# Patient Record
Sex: Female | Born: 1955 | Race: White | Hispanic: No | Marital: Married | Smoking: Never smoker
Health system: Southern US, Community
[De-identification: ages and names within clinical notes are randomized; demographics above are authoritative.]

## PROBLEM LIST (undated history)

## (undated) DIAGNOSIS — F419 Anxiety disorder, unspecified: Secondary | ICD-10-CM

## (undated) DIAGNOSIS — I8390 Asymptomatic varicose veins of unspecified lower extremity: Secondary | ICD-10-CM

## (undated) DIAGNOSIS — I4510 Unspecified right bundle-branch block: Secondary | ICD-10-CM

## (undated) DIAGNOSIS — N318 Other neuromuscular dysfunction of bladder: Secondary | ICD-10-CM

## (undated) DIAGNOSIS — N3010 Interstitial cystitis (chronic) without hematuria: Secondary | ICD-10-CM

## (undated) DIAGNOSIS — Z87442 Personal history of urinary calculi: Secondary | ICD-10-CM

## (undated) DIAGNOSIS — G709 Myoneural disorder, unspecified: Secondary | ICD-10-CM

## (undated) DIAGNOSIS — K219 Gastro-esophageal reflux disease without esophagitis: Secondary | ICD-10-CM

## (undated) DIAGNOSIS — H3530 Unspecified macular degeneration: Secondary | ICD-10-CM

## (undated) DIAGNOSIS — T7840XA Allergy, unspecified, initial encounter: Secondary | ICD-10-CM

## (undated) DIAGNOSIS — M1990 Unspecified osteoarthritis, unspecified site: Secondary | ICD-10-CM

## (undated) DIAGNOSIS — R591 Generalized enlarged lymph nodes: Secondary | ICD-10-CM

## (undated) DIAGNOSIS — D860 Sarcoidosis of lung: Secondary | ICD-10-CM

## (undated) DIAGNOSIS — E669 Obesity, unspecified: Secondary | ICD-10-CM

## (undated) DIAGNOSIS — I10 Essential (primary) hypertension: Secondary | ICD-10-CM

## (undated) DIAGNOSIS — D649 Anemia, unspecified: Secondary | ICD-10-CM

## (undated) DIAGNOSIS — IMO0001 Reserved for inherently not codable concepts without codable children: Secondary | ICD-10-CM

## (undated) DIAGNOSIS — E041 Nontoxic single thyroid nodule: Secondary | ICD-10-CM

## (undated) HISTORY — DX: Obesity, unspecified: E66.9

## (undated) HISTORY — DX: Anxiety disorder, unspecified: F41.9

## (undated) HISTORY — DX: Interstitial cystitis (chronic) without hematuria: N30.10

## (undated) HISTORY — DX: Gastro-esophageal reflux disease without esophagitis: K21.9

## (undated) HISTORY — PX: BLADDER SURGERY: SHX569

## (undated) HISTORY — DX: Unspecified macular degeneration: H35.30

## (undated) HISTORY — DX: Allergy, unspecified, initial encounter: T78.40XA

## (undated) HISTORY — PX: FOOT SURGERY: SHX648

## (undated) HISTORY — DX: Reserved for inherently not codable concepts without codable children: IMO0001

## (undated) HISTORY — DX: Nontoxic single thyroid nodule: E04.1

---
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
## 1978-02-14 DIAGNOSIS — IMO0001 Reserved for inherently not codable concepts without codable children: Secondary | ICD-10-CM

## 1978-02-14 HISTORY — PX: COLPOSCOPY: SHX161

## 1978-02-14 HISTORY — PX: CRYOTHERAPY: SHX1416

## 1978-02-14 HISTORY — DX: Reserved for inherently not codable concepts without codable children: IMO0001

## 1987-02-15 HISTORY — PX: NASAL SINUS SURGERY: SHX719

## 1995-02-15 HISTORY — PX: KNEE SURGERY: SHX244

## 1995-02-15 HISTORY — PX: EYE SURGERY: SHX253

## 1998-03-05 ENCOUNTER — Ambulatory Visit (HOSPITAL_COMMUNITY): Admission: RE | Admit: 1998-03-05 | Discharge: 1998-03-05 | Payer: Self-pay | Admitting: Obstetrics and Gynecology

## 1999-03-15 ENCOUNTER — Encounter: Payer: Self-pay | Admitting: Obstetrics and Gynecology

## 1999-03-15 ENCOUNTER — Ambulatory Visit (HOSPITAL_COMMUNITY): Admission: RE | Admit: 1999-03-15 | Discharge: 1999-03-15 | Payer: Self-pay | Admitting: Obstetrics and Gynecology

## 1999-10-05 ENCOUNTER — Ambulatory Visit (HOSPITAL_COMMUNITY): Admission: RE | Admit: 1999-10-05 | Discharge: 1999-10-05 | Payer: Self-pay | Admitting: Pulmonary Disease

## 1999-10-05 ENCOUNTER — Encounter: Payer: Self-pay | Admitting: Pulmonary Disease

## 2000-03-20 ENCOUNTER — Ambulatory Visit (HOSPITAL_COMMUNITY): Admission: RE | Admit: 2000-03-20 | Discharge: 2000-03-20 | Payer: Self-pay | Admitting: Obstetrics and Gynecology

## 2000-03-20 ENCOUNTER — Encounter: Payer: Self-pay | Admitting: Obstetrics and Gynecology

## 2001-03-22 ENCOUNTER — Encounter: Payer: Self-pay | Admitting: Obstetrics and Gynecology

## 2001-03-22 ENCOUNTER — Ambulatory Visit (HOSPITAL_COMMUNITY): Admission: RE | Admit: 2001-03-22 | Discharge: 2001-03-22 | Payer: Self-pay | Admitting: Obstetrics and Gynecology

## 2001-06-29 ENCOUNTER — Other Ambulatory Visit: Admission: RE | Admit: 2001-06-29 | Discharge: 2001-06-29 | Payer: Self-pay | Admitting: Obstetrics and Gynecology

## 2002-03-27 ENCOUNTER — Ambulatory Visit (HOSPITAL_COMMUNITY): Admission: RE | Admit: 2002-03-27 | Discharge: 2002-03-27 | Payer: Self-pay | Admitting: Obstetrics and Gynecology

## 2002-03-27 ENCOUNTER — Encounter: Payer: Self-pay | Admitting: Obstetrics and Gynecology

## 2002-07-01 ENCOUNTER — Other Ambulatory Visit: Admission: RE | Admit: 2002-07-01 | Discharge: 2002-07-01 | Payer: Self-pay | Admitting: Obstetrics and Gynecology

## 2003-02-15 HISTORY — PX: GASTRIC BYPASS: SHX52

## 2003-04-07 ENCOUNTER — Ambulatory Visit (HOSPITAL_COMMUNITY): Admission: RE | Admit: 2003-04-07 | Discharge: 2003-04-07 | Payer: Self-pay | Admitting: Obstetrics and Gynecology

## 2003-07-17 ENCOUNTER — Other Ambulatory Visit: Admission: RE | Admit: 2003-07-17 | Discharge: 2003-07-17 | Payer: Self-pay | Admitting: Obstetrics and Gynecology

## 2004-01-12 ENCOUNTER — Ambulatory Visit: Payer: Self-pay | Admitting: Pulmonary Disease

## 2004-02-15 HISTORY — PX: REFRACTIVE SURGERY: SHX103

## 2004-04-15 ENCOUNTER — Ambulatory Visit (HOSPITAL_COMMUNITY): Admission: RE | Admit: 2004-04-15 | Discharge: 2004-04-15 | Payer: Self-pay | Admitting: Obstetrics and Gynecology

## 2004-07-19 ENCOUNTER — Other Ambulatory Visit: Admission: RE | Admit: 2004-07-19 | Discharge: 2004-07-19 | Payer: Self-pay | Admitting: Obstetrics and Gynecology

## 2004-09-27 ENCOUNTER — Ambulatory Visit: Payer: Self-pay | Admitting: Pulmonary Disease

## 2004-10-28 ENCOUNTER — Ambulatory Visit: Payer: Self-pay | Admitting: Pulmonary Disease

## 2004-11-18 ENCOUNTER — Ambulatory Visit: Payer: Self-pay | Admitting: Pulmonary Disease

## 2004-12-02 ENCOUNTER — Ambulatory Visit: Payer: Self-pay | Admitting: Internal Medicine

## 2005-02-14 HISTORY — PX: STOMACH SURGERY: SHX791

## 2005-04-20 ENCOUNTER — Ambulatory Visit (HOSPITAL_COMMUNITY): Admission: RE | Admit: 2005-04-20 | Discharge: 2005-04-20 | Payer: Self-pay | Admitting: Obstetrics and Gynecology

## 2005-07-01 ENCOUNTER — Ambulatory Visit: Payer: Self-pay | Admitting: Pulmonary Disease

## 2005-07-21 ENCOUNTER — Other Ambulatory Visit: Admission: RE | Admit: 2005-07-21 | Discharge: 2005-07-21 | Payer: Self-pay | Admitting: Obstetrics and Gynecology

## 2005-12-06 ENCOUNTER — Ambulatory Visit: Payer: Self-pay | Admitting: Pulmonary Disease

## 2006-02-14 HISTORY — PX: JOINT REPLACEMENT: SHX530

## 2006-03-10 ENCOUNTER — Ambulatory Visit: Payer: Self-pay | Admitting: Pulmonary Disease

## 2006-03-10 LAB — CONVERTED CEMR LAB
Albumin: 4 g/dL (ref 3.5–5.2)
BUN: 17 mg/dL (ref 6–23)
Basophils Relative: 0.6 % (ref 0.0–1.0)
Calcium: 9.3 mg/dL (ref 8.4–10.5)
Chloride: 107 meq/L (ref 96–112)
Eosinophils Relative: 3.6 % (ref 0.0–5.0)
GFR calc Af Amer: 114 mL/min
HCT: 39.8 % (ref 36.0–46.0)
Hemoglobin, Urine: NEGATIVE
Hemoglobin: 13.8 g/dL (ref 12.0–15.0)
Ketones, ur: 40 mg/dL — AB
LDL Cholesterol: 83 mg/dL (ref 0–99)
MCV: 85.2 fL (ref 78.0–100.0)
Monocytes Absolute: 0.5 10*3/uL (ref 0.2–0.7)
Mucus, UA: NEGATIVE
Neutrophils Relative %: 59.7 % (ref 43.0–77.0)
RBC / HPF: NONE SEEN
RBC: 4.68 M/uL (ref 3.87–5.11)
Sodium: 141 meq/L (ref 135–145)
Total CHOL/HDL Ratio: 3.1
Triglycerides: 59 mg/dL (ref 0–149)
Urine Glucose: NEGATIVE mg/dL
Urobilinogen, UA: 0.2 (ref 0.0–1.0)
VLDL: 12 mg/dL (ref 0–40)
WBC: 7.7 10*3/uL (ref 4.5–10.5)

## 2006-03-16 ENCOUNTER — Ambulatory Visit: Payer: Self-pay | Admitting: Cardiology

## 2006-03-30 ENCOUNTER — Ambulatory Visit: Payer: Self-pay

## 2006-03-30 ENCOUNTER — Encounter: Payer: Self-pay | Admitting: Cardiology

## 2006-04-11 ENCOUNTER — Ambulatory Visit: Payer: Self-pay | Admitting: Pulmonary Disease

## 2006-05-01 ENCOUNTER — Ambulatory Visit (HOSPITAL_COMMUNITY): Admission: RE | Admit: 2006-05-01 | Discharge: 2006-05-01 | Payer: Self-pay | Admitting: Gastroenterology

## 2006-05-17 ENCOUNTER — Inpatient Hospital Stay (HOSPITAL_COMMUNITY): Admission: RE | Admit: 2006-05-17 | Discharge: 2006-05-21 | Payer: Self-pay | Admitting: Specialist

## 2006-06-24 ENCOUNTER — Ambulatory Visit: Payer: Self-pay | Admitting: Family Medicine

## 2006-06-30 ENCOUNTER — Ambulatory Visit: Payer: Self-pay | Admitting: Pulmonary Disease

## 2006-07-25 ENCOUNTER — Other Ambulatory Visit: Admission: RE | Admit: 2006-07-25 | Discharge: 2006-07-25 | Payer: Self-pay | Admitting: Obstetrics and Gynecology

## 2006-07-28 ENCOUNTER — Ambulatory Visit: Payer: Self-pay | Admitting: Gastroenterology

## 2006-08-04 ENCOUNTER — Ambulatory Visit: Payer: Self-pay | Admitting: Gastroenterology

## 2006-08-04 LAB — HM COLONOSCOPY

## 2006-08-16 ENCOUNTER — Ambulatory Visit (HOSPITAL_BASED_OUTPATIENT_CLINIC_OR_DEPARTMENT_OTHER): Admission: RE | Admit: 2006-08-16 | Discharge: 2006-08-16 | Payer: Self-pay | Admitting: Specialist

## 2006-11-20 ENCOUNTER — Ambulatory Visit: Payer: Self-pay | Admitting: Pulmonary Disease

## 2006-12-06 ENCOUNTER — Ambulatory Visit: Payer: Self-pay | Admitting: Pulmonary Disease

## 2007-01-09 ENCOUNTER — Telehealth: Payer: Self-pay | Admitting: Pulmonary Disease

## 2007-02-27 DIAGNOSIS — K219 Gastro-esophageal reflux disease without esophagitis: Secondary | ICD-10-CM | POA: Insufficient documentation

## 2007-02-27 DIAGNOSIS — I872 Venous insufficiency (chronic) (peripheral): Secondary | ICD-10-CM | POA: Insufficient documentation

## 2007-02-27 DIAGNOSIS — I10 Essential (primary) hypertension: Secondary | ICD-10-CM | POA: Insufficient documentation

## 2007-02-27 DIAGNOSIS — J309 Allergic rhinitis, unspecified: Secondary | ICD-10-CM | POA: Insufficient documentation

## 2007-02-27 DIAGNOSIS — M1990 Unspecified osteoarthritis, unspecified site: Secondary | ICD-10-CM | POA: Insufficient documentation

## 2007-02-27 DIAGNOSIS — F411 Generalized anxiety disorder: Secondary | ICD-10-CM | POA: Insufficient documentation

## 2007-02-27 DIAGNOSIS — F419 Anxiety disorder, unspecified: Secondary | ICD-10-CM | POA: Insufficient documentation

## 2007-02-27 DIAGNOSIS — K649 Unspecified hemorrhoids: Secondary | ICD-10-CM | POA: Insufficient documentation

## 2007-05-03 ENCOUNTER — Ambulatory Visit (HOSPITAL_COMMUNITY): Admission: RE | Admit: 2007-05-03 | Discharge: 2007-05-03 | Payer: Self-pay | Admitting: Obstetrics and Gynecology

## 2007-07-26 ENCOUNTER — Other Ambulatory Visit: Admission: RE | Admit: 2007-07-26 | Discharge: 2007-07-26 | Payer: Self-pay | Admitting: Obstetrics and Gynecology

## 2008-01-03 ENCOUNTER — Ambulatory Visit: Payer: Self-pay | Admitting: Obstetrics and Gynecology

## 2008-02-13 ENCOUNTER — Ambulatory Visit: Payer: Self-pay | Admitting: Pulmonary Disease

## 2008-02-23 ENCOUNTER — Emergency Department (HOSPITAL_COMMUNITY): Admission: EM | Admit: 2008-02-23 | Discharge: 2008-02-23 | Payer: Self-pay | Admitting: Emergency Medicine

## 2008-05-06 ENCOUNTER — Ambulatory Visit (HOSPITAL_COMMUNITY): Admission: RE | Admit: 2008-05-06 | Discharge: 2008-05-06 | Payer: Self-pay | Admitting: Obstetrics and Gynecology

## 2008-07-28 ENCOUNTER — Other Ambulatory Visit: Admission: RE | Admit: 2008-07-28 | Discharge: 2008-07-28 | Payer: Self-pay | Admitting: Obstetrics and Gynecology

## 2008-07-28 ENCOUNTER — Ambulatory Visit: Payer: Self-pay | Admitting: Obstetrics and Gynecology

## 2008-07-28 ENCOUNTER — Encounter: Payer: Self-pay | Admitting: Obstetrics and Gynecology

## 2008-07-29 LAB — CONVERTED CEMR LAB: Pap Smear: NORMAL

## 2008-08-06 ENCOUNTER — Ambulatory Visit: Payer: Self-pay | Admitting: Internal Medicine

## 2008-08-06 ENCOUNTER — Telehealth (INDEPENDENT_AMBULATORY_CARE_PROVIDER_SITE_OTHER): Payer: Self-pay | Admitting: *Deleted

## 2008-08-11 ENCOUNTER — Telehealth: Payer: Self-pay | Admitting: Pulmonary Disease

## 2008-08-14 DIAGNOSIS — E041 Nontoxic single thyroid nodule: Secondary | ICD-10-CM

## 2008-08-14 HISTORY — DX: Nontoxic single thyroid nodule: E04.1

## 2008-08-21 ENCOUNTER — Ambulatory Visit: Payer: Self-pay | Admitting: Internal Medicine

## 2008-08-21 DIAGNOSIS — M810 Age-related osteoporosis without current pathological fracture: Secondary | ICD-10-CM

## 2008-08-21 DIAGNOSIS — Z9884 Bariatric surgery status: Secondary | ICD-10-CM | POA: Insufficient documentation

## 2008-08-21 HISTORY — DX: Age-related osteoporosis without current pathological fracture: M81.0

## 2008-08-21 LAB — CONVERTED CEMR LAB
ALT: 34 units/L (ref 0–35)
BUN: 11 mg/dL (ref 6–23)
Bilirubin, Direct: 0.1 mg/dL (ref 0.0–0.3)
CO2: 24 meq/L (ref 19–32)
Chloride: 107 meq/L (ref 96–112)
Eosinophils Absolute: 0.3 10*3/uL (ref 0.0–0.7)
Eosinophils Relative: 3 % (ref 0–5)
HDL: 47 mg/dL (ref >39)
Lymphocytes Relative: 38 % (ref 12–46)
Lymphs Abs: 3.2 10*3/uL (ref 0.7–4.0)
Monocytes Relative: 7 % (ref 3–12)
Platelets: 376 10*3/uL (ref 150–400)
Potassium: 4.8 meq/L (ref 3.5–5.3)
RBC: 4.81 M/uL (ref 3.87–5.11)
TIBC: 295 ug/dL (ref 250–470)
TSH: 0.336 microintl units/mL — ABNORMAL LOW (ref 0.350–4.500)
Total Bilirubin: 0.4 mg/dL (ref 0.3–1.2)
Triglycerides: 74 mg/dL (ref <150)
UIBC: 257 ug/dL
VLDL: 15 mg/dL (ref 0–40)
Vit D, 1,25-Dihydroxy: 26 — ABNORMAL LOW (ref 30–89)
Vitamin B-12: 1841 pg/mL — ABNORMAL HIGH (ref 211–911)
WBC: 8.5 10*3/uL (ref 4.0–10.5)

## 2008-08-22 ENCOUNTER — Telehealth: Payer: Self-pay | Admitting: Internal Medicine

## 2008-09-02 ENCOUNTER — Ambulatory Visit: Payer: Self-pay | Admitting: Obstetrics and Gynecology

## 2008-09-08 ENCOUNTER — Ambulatory Visit: Payer: Self-pay | Admitting: Diagnostic Radiology

## 2008-09-08 ENCOUNTER — Ambulatory Visit (HOSPITAL_BASED_OUTPATIENT_CLINIC_OR_DEPARTMENT_OTHER): Admission: RE | Admit: 2008-09-08 | Discharge: 2008-09-08 | Payer: Self-pay | Admitting: Internal Medicine

## 2008-09-08 ENCOUNTER — Ambulatory Visit: Payer: Self-pay | Admitting: Internal Medicine

## 2008-09-08 DIAGNOSIS — R748 Abnormal levels of other serum enzymes: Secondary | ICD-10-CM | POA: Insufficient documentation

## 2008-09-08 DIAGNOSIS — E079 Disorder of thyroid, unspecified: Secondary | ICD-10-CM | POA: Insufficient documentation

## 2008-09-08 DIAGNOSIS — E559 Vitamin D deficiency, unspecified: Secondary | ICD-10-CM | POA: Insufficient documentation

## 2008-09-08 LAB — CONVERTED CEMR LAB
ALT: 17 units/L (ref 0–35)
Alkaline Phosphatase: 122 units/L — ABNORMAL HIGH (ref 39–117)
Thyroglobulin Ab: 30.7 (ref 0.0–60.0)

## 2008-09-10 ENCOUNTER — Telehealth: Payer: Self-pay | Admitting: Internal Medicine

## 2008-09-10 DIAGNOSIS — E041 Nontoxic single thyroid nodule: Secondary | ICD-10-CM | POA: Insufficient documentation

## 2008-09-17 ENCOUNTER — Telehealth: Payer: Self-pay | Admitting: Internal Medicine

## 2008-09-24 ENCOUNTER — Ambulatory Visit: Payer: Self-pay | Admitting: Internal Medicine

## 2008-09-25 ENCOUNTER — Encounter: Payer: Self-pay | Admitting: Internal Medicine

## 2008-10-15 ENCOUNTER — Ambulatory Visit: Payer: Self-pay | Admitting: Gastroenterology

## 2008-10-16 LAB — CONVERTED CEMR LAB
Albumin: 3.7 g/dL (ref 3.5–5.2)
Basophils Absolute: 0 10*3/uL (ref 0.0–0.1)
CO2: 29 meq/L (ref 19–32)
Chloride: 106 meq/L (ref 96–112)
Eosinophils Absolute: 0.3 10*3/uL (ref 0.0–0.7)
Eosinophils Relative: 3.4 % (ref 0.0–5.0)
GFR calc non Af Amer: 93.22 mL/min (ref >60)
HCT: 37.2 % (ref 36.0–46.0)
Hemoglobin: 12.8 g/dL (ref 12.0–15.0)
INR: 1.1 — ABNORMAL HIGH (ref 0.8–1.0)
IgG (Immunoglobin G), Serum: 1183 mg/dL (ref 694–1618)
Lymphocytes Relative: 37.6 % (ref 12.0–46.0)
MCV: 82.9 fL (ref 78.0–100.0)
Monocytes Relative: 5.1 % (ref 3.0–12.0)
Neutrophils Relative %: 53.9 % (ref 43.0–77.0)
Prothrombin Time: 11.2 s (ref 9.1–11.7)
RBC: 4.49 M/uL (ref 3.87–5.11)
Sodium: 142 meq/L (ref 135–145)
Total Protein: 7.1 g/dL (ref 6.0–8.3)
Transferrin: 245.5 mg/dL (ref 212.0–360.0)
WBC: 7.7 10*3/uL (ref 4.5–10.5)

## 2008-10-24 LAB — CONVERTED CEMR LAB
A-1 Antitrypsin, Ser: 152 mg/dL (ref 83–200)
Anti Nuclear Antibody(ANA): NEGATIVE
HCV Ab: NEGATIVE
Hep B S Ab: NEGATIVE
Tissue Transglutaminase Ab, IgA: 0.8 units (ref <7)

## 2008-11-12 ENCOUNTER — Telehealth: Payer: Self-pay | Admitting: Internal Medicine

## 2008-12-30 ENCOUNTER — Ambulatory Visit: Payer: Self-pay | Admitting: Internal Medicine

## 2008-12-30 ENCOUNTER — Telehealth: Payer: Self-pay | Admitting: Internal Medicine

## 2009-02-09 ENCOUNTER — Ambulatory Visit: Payer: Self-pay | Admitting: Internal Medicine

## 2009-02-09 DIAGNOSIS — N390 Urinary tract infection, site not specified: Secondary | ICD-10-CM | POA: Insufficient documentation

## 2009-02-09 LAB — CONVERTED CEMR LAB
Bilirubin Urine: NEGATIVE
Blood in Urine, dipstick: NEGATIVE
Glucose, Urine, Semiquant: NEGATIVE
Specific Gravity, Urine: <1.005
WBC Urine, dipstick: NEGATIVE
pH: 5

## 2009-02-17 ENCOUNTER — Telehealth: Payer: Self-pay | Admitting: Internal Medicine

## 2009-03-02 ENCOUNTER — Telehealth: Payer: Self-pay | Admitting: Internal Medicine

## 2009-03-02 ENCOUNTER — Ambulatory Visit (HOSPITAL_BASED_OUTPATIENT_CLINIC_OR_DEPARTMENT_OTHER): Admission: RE | Admit: 2009-03-02 | Discharge: 2009-03-02 | Payer: Self-pay | Admitting: Internal Medicine

## 2009-03-02 ENCOUNTER — Ambulatory Visit: Payer: Self-pay | Admitting: Diagnostic Radiology

## 2009-03-23 ENCOUNTER — Encounter (HOSPITAL_COMMUNITY): Admission: RE | Admit: 2009-03-23 | Discharge: 2009-05-28 | Payer: Self-pay | Admitting: Internal Medicine

## 2009-03-24 ENCOUNTER — Telehealth: Payer: Self-pay | Admitting: Internal Medicine

## 2009-03-31 ENCOUNTER — Ambulatory Visit: Payer: Self-pay | Admitting: Internal Medicine

## 2009-03-31 DIAGNOSIS — J329 Chronic sinusitis, unspecified: Secondary | ICD-10-CM | POA: Insufficient documentation

## 2009-04-01 ENCOUNTER — Encounter: Admission: RE | Admit: 2009-04-01 | Discharge: 2009-04-01 | Payer: Self-pay | Admitting: Internal Medicine

## 2009-04-01 ENCOUNTER — Other Ambulatory Visit: Admission: RE | Admit: 2009-04-01 | Discharge: 2009-04-01 | Payer: Self-pay | Admitting: Interventional Radiology

## 2009-04-01 ENCOUNTER — Encounter: Payer: Self-pay | Admitting: Internal Medicine

## 2009-04-06 ENCOUNTER — Telehealth: Payer: Self-pay | Admitting: Internal Medicine

## 2009-05-07 ENCOUNTER — Ambulatory Visit: Payer: Self-pay | Admitting: Diagnostic Radiology

## 2009-05-07 ENCOUNTER — Ambulatory Visit (HOSPITAL_BASED_OUTPATIENT_CLINIC_OR_DEPARTMENT_OTHER): Admission: RE | Admit: 2009-05-07 | Discharge: 2009-05-07 | Payer: Self-pay | Admitting: Obstetrics and Gynecology

## 2009-05-14 ENCOUNTER — Ambulatory Visit: Payer: Self-pay | Admitting: Internal Medicine

## 2009-05-14 LAB — CONVERTED CEMR LAB
Bilirubin Urine: NEGATIVE
Nitrite: NEGATIVE
Specific Gravity, Urine: 1.01
pH: 6.5

## 2009-05-19 ENCOUNTER — Telehealth: Payer: Self-pay | Admitting: Internal Medicine

## 2009-07-10 ENCOUNTER — Ambulatory Visit: Payer: Self-pay | Admitting: Internal Medicine

## 2009-07-24 ENCOUNTER — Ambulatory Visit: Payer: Self-pay | Admitting: Internal Medicine

## 2009-07-24 ENCOUNTER — Telehealth (INDEPENDENT_AMBULATORY_CARE_PROVIDER_SITE_OTHER): Payer: Self-pay | Admitting: *Deleted

## 2009-07-24 LAB — CONVERTED CEMR LAB
Bilirubin Urine: NEGATIVE
Blood in Urine, dipstick: NEGATIVE
Glucose, Urine, Semiquant: NEGATIVE
Ketones, urine, test strip: NEGATIVE
Protein, U semiquant: NEGATIVE
Urobilinogen, UA: 0.2
pH: 6.5

## 2009-07-27 ENCOUNTER — Encounter: Payer: Self-pay | Admitting: Internal Medicine

## 2009-07-29 ENCOUNTER — Ambulatory Visit: Payer: Self-pay | Admitting: Gastroenterology

## 2009-07-30 ENCOUNTER — Ambulatory Visit: Payer: Self-pay | Admitting: Obstetrics and Gynecology

## 2009-07-30 ENCOUNTER — Other Ambulatory Visit: Admission: RE | Admit: 2009-07-30 | Discharge: 2009-07-30 | Payer: Self-pay | Admitting: Obstetrics and Gynecology

## 2009-08-03 LAB — CONVERTED CEMR LAB: Pap Smear: NORMAL

## 2009-08-05 LAB — CONVERTED CEMR LAB
ALT: 18 units/L (ref 0–35)
AST: 19 units/L (ref 0–37)
Total Bilirubin: 0.3 mg/dL (ref 0.3–1.2)

## 2009-09-02 ENCOUNTER — Ambulatory Visit: Payer: Self-pay | Admitting: Internal Medicine

## 2009-09-02 LAB — CONVERTED CEMR LAB
BUN: 17 mg/dL
CO2: 23 meq/L
Calcium: 8.7 mg/dL
Chloride: 109 meq/L
Cholesterol: 187 mg/dL
Creatinine, Ser: 0.56 mg/dL
Glucose, Bld: 92 mg/dL
HCT: 40.8 %
HDL: 63 mg/dL
Hemoglobin: 12.4 g/dL
LDL Cholesterol: 110 mg/dL — ABNORMAL HIGH
MCHC: 30.4 g/dL
MCV: 85.9 fL
Platelets: 336 K/uL
Potassium: 4.5 meq/L
RBC: 4.75 M/uL
RDW: 16.5 % — ABNORMAL HIGH
Sodium: 143 meq/L
TSH: 0.66 u[IU]/mL
Total CHOL/HDL Ratio: 3
Triglycerides: 68 mg/dL
VLDL: 14 mg/dL
Vit D, 1,25-Dihydroxy: 31
WBC: 6.7 10*3/microliter

## 2009-09-03 ENCOUNTER — Encounter: Payer: Self-pay | Admitting: Internal Medicine

## 2009-10-06 ENCOUNTER — Ambulatory Visit (HOSPITAL_BASED_OUTPATIENT_CLINIC_OR_DEPARTMENT_OTHER): Admission: RE | Admit: 2009-10-06 | Discharge: 2009-10-06 | Payer: Self-pay | Admitting: Internal Medicine

## 2009-10-06 ENCOUNTER — Ambulatory Visit: Payer: Self-pay | Admitting: Diagnostic Radiology

## 2009-10-08 ENCOUNTER — Encounter: Payer: Self-pay | Admitting: Internal Medicine

## 2009-11-12 ENCOUNTER — Ambulatory Visit: Payer: Self-pay | Admitting: Internal Medicine

## 2009-12-01 ENCOUNTER — Ambulatory Visit: Payer: Self-pay | Admitting: Internal Medicine

## 2010-01-05 ENCOUNTER — Telehealth: Payer: Self-pay | Admitting: Internal Medicine

## 2010-02-16 ENCOUNTER — Emergency Department (HOSPITAL_BASED_OUTPATIENT_CLINIC_OR_DEPARTMENT_OTHER)
Admission: EM | Admit: 2010-02-16 | Discharge: 2010-02-16 | Payer: Self-pay | Source: Home / Self Care | Admitting: Podiatry

## 2010-02-25 ENCOUNTER — Ambulatory Visit: Admit: 2010-02-25 | Payer: Self-pay | Admitting: Internal Medicine

## 2010-03-07 ENCOUNTER — Encounter: Payer: Self-pay | Admitting: Internal Medicine

## 2010-03-08 ENCOUNTER — Telehealth: Payer: Self-pay | Admitting: Internal Medicine

## 2010-03-11 ENCOUNTER — Encounter: Payer: Self-pay | Admitting: Internal Medicine

## 2010-03-11 ENCOUNTER — Ambulatory Visit (INDEPENDENT_AMBULATORY_CARE_PROVIDER_SITE_OTHER)
Admission: RE | Admit: 2010-03-11 | Discharge: 2010-03-11 | Payer: PRIVATE HEALTH INSURANCE | Source: Home / Self Care | Attending: Internal Medicine | Admitting: Internal Medicine

## 2010-03-11 DIAGNOSIS — J069 Acute upper respiratory infection, unspecified: Secondary | ICD-10-CM | POA: Insufficient documentation

## 2010-03-11 DIAGNOSIS — Z01818 Encounter for other preprocedural examination: Secondary | ICD-10-CM

## 2010-03-11 DIAGNOSIS — N390 Urinary tract infection, site not specified: Secondary | ICD-10-CM

## 2010-03-11 LAB — CONVERTED CEMR LAB
Bilirubin Urine: NEGATIVE
Blood in Urine, dipstick: NEGATIVE
Ketones, urine, test strip: NEGATIVE
Nitrite: NEGATIVE
Protein, U semiquant: NEGATIVE
Specific Gravity, Urine: 1.01
Urobilinogen, UA: 0.2

## 2010-03-16 NOTE — Assessment & Plan Note (Signed)
Summary: UTI/MHF   Vital Signs:  Patient profile:   55 year old female Height:      65 inches Weight:      227.50 pounds BMI:     37.99 O2 Sat:      99 % on Room air Temp:     98.1 degrees F oral Pulse rate:   85 / minute Pulse rhythm:   regular Resp:     22 per minute BP sitting:   122 / 86  (right arm) Cuff size:   large  Vitals Entered By: Glendell Docker CMA (May 14, 2009 11:17 AM)  O2 Flow:  Room air CC: Rm 2- Urinary Problems, Dysuria   Primary Care Provider:  Dondra Spry DO  CC:  Rm 2- Urinary Problems and Dysuria.  History of Present Illness:  Dysuria      This is a 55 year old woman who presents with Dysuria.  The patient reports burning with urination, urinary frequency, and urgency.  The patient denies the following associated symptoms: fever, shaking chills, flank pain, and back pain.  she has hx of interstitial cystitis.  Dr. Normajean Baxter started amitriptyline.  Allergies: 1)  ! Codeine 2)  ! Cortisone 3)  ! Ibuprofen (Ibuprofen) 4)  Prednisone (Prednisone) 5)  Macrobid  Past History:  Past Medical History: Obesity, status post bariatric procedure and [place] 2005 GERD  Borderline Hypertension  Allergic rhinitis  Osteoporosis    Elevated Alk Phos - negative work up (presumed secondary to fatty liver) Thyroid nodules 08/2008 Interstitial cystitis - Dr. Normajean Baxter  Family History: Family History Diabetes 1st degree relative Family History High cholesterol Family History Hypertension Family History of Prostate CA 1st degree relative <50 Colon ca - no           Social History: Occupation: [employer] Divorced 1 daughter bipolar 22   Never Smoked    Alcohol use-no       Physical Exam  General:  alert, well-developed, and well-nourished.   Lungs:  normal respiratory effort and normal breath sounds.   Heart:  normal rate, regular rhythm, and no gallop.   Abdomen:  soft, non-tender, and normal bowel sounds.  no flank  tendereness   Impression & Recommendations:  Problem # 1:  UTI (ICD-599.0) 55 y/o with UTI.  no assoc with sexual activity.  menopause since early 18's.  I suggest she increase fluids and take cranberry tabs.  If persistent recurrent UTI, consider add estrace vaginal cream  Her updated medication list for this problem includes:    Ciprofloxacin Hcl 500 Mg Tabs (Ciprofloxacin hcl) ..... One by mouth two times a day  Problem # 2:  ANXIETY (ICD-300.00) Assessment: Unchanged Wt gain when she take prozac.  switch to citalopram The following medications were removed from the medication list:    Prozac 10 Mg Caps (Fluoxetine hcl) .Marland Kitchen... Take 1 capsule by mouth once a day Her updated medication list for this problem includes:    Amitriptyline Hcl 50 Mg Tabs (Amitriptyline hcl) .Marland Kitchen... Take 1 tablet by mouth once a day    Alprazolam 0.5 Mg Tabs (Alprazolam) .Marland Kitchen... Take 1 tablet by mouth two times a day as needed    Citalopram Hydrobromide 20 Mg Tabs (Citalopram hydrobromide) .Marland Kitchen... 1/2 by mouth once daily x 7 days, then one tab by mouth once daily  Complete Medication List: 1)  Amitriptyline Hcl 50 Mg Tabs (Amitriptyline hcl) .... Take 1 tablet by mouth once a day 2)  Omeprazole 40 Mg Cpdr (Omeprazole) .Marland KitchenMarland KitchenMarland Kitchen  2 caps by mouth two times a day 3)  Alprazolam 0.5 Mg Tabs (Alprazolam) .... Take 1 tablet by mouth two times a day as needed 4)  Zyrtec Allergy 10 Mg Tabs (Cetirizine hcl) .... Take 1 tablet by mouth once a day 5)  Tums Ultra 1000 1000 Mg Chew (Calcium carbonate antacid) .... Two times a day 6)  Actonel 150 Mg Tabs (Risedronate sodium) .... Once a month 7)  Omnaris 50 Mcg/act Susp (Ciclesonide) .Marland Kitchen.. 1 spray each nostril two times a day 8)  Astepro 0.15 % Soln (Azelastine hcl) .Marland Kitchen.. 1 spray each nostril two times a day 9)  Fish Oil 1000 Mg Caps (Omega-3 fatty acids) .... Take 1 capsule by mouth two times a day 10)  Vitamin D 1000 Unit Tabs (Cholecalciferol) .... Take 2 tablets by mouth once a  day 11)  Valtrex 500 Mg Tabs (Valacyclovir hcl) .... Take 1 tablet by mouth once a day as needed 12)  Multivitamins Tabs (Multiple vitamin) .... Take 1 tablet by mouth two times a day 13)  Ra Vitamin A 21308 Unit Caps (Vitamin a) .... Take 1 capsule by mouth once a day 14)  Citalopram Hydrobromide 20 Mg Tabs (Citalopram hydrobromide) .... 1/2 by mouth once daily x 7 days, then one tab by mouth once daily 15)  Ciprofloxacin Hcl 500 Mg Tabs (Ciprofloxacin hcl) .... One by mouth two times a day  Patient Instructions: 1)  Please schedule a follow-up appointment in 2 months. Prescriptions: CIPROFLOXACIN HCL 500 MG TABS (CIPROFLOXACIN HCL) one by mouth two times a day  #10 x 0   Entered and Authorized by:   D. Thomos Lemons DO   Signed by:   D. Thomos Lemons DO on 05/14/2009   Method used:   Electronically to        [facility] (retail)       [address]       [place], [place]  [address]       Ph: [phone]       Fax: [phone]   RxID:   [id number] CITALOPRAM HYDROBROMIDE 20 MG TABS (CITALOPRAM HYDROBROMIDE) 1/2 by mouth once daily x 7 days, then one tab by mouth once daily  #30 x 5   Entered and Authorized by:   D. Thomos Lemons DO   Signed by:   D. Thomos Lemons DO on 05/14/2009   Method used:   Electronically to        [facility] (retail)       [address]       [place], [place]  [address]       Ph: [phone]       Fax: [phone]   RxID:   [id number]   Current Allergies (reviewed today): ! CODEINE ! CORTISONE ! IBUPROFEN (IBUPROFEN) PREDNISONE (PREDNISONE) MACROBID  Laboratory Results   Urine Tests    Routine Urinalysis   Color: lt. yellow Appearance: Hazy Glucose: negative   (Normal Range: Negative) Bilirubin: negative   (Normal Range: Negative) Ketone: negative   (Normal Range: Negative) Spec. Gravity: 1.010   (Normal Range: 1.003-1.035) Blood: small   (Normal  Range: Negative) pH: 6.5   (Normal Range: 5.0-8.0) Protein: negative   (Normal Range: Negative) Urobilinogen: 0.2   (Normal Range: 0-1) Nitrite: negative   (Normal Range: Negative) Leukocyte Esterace: large   (Normal Range: Negative)

## 2010-03-16 NOTE — Progress Notes (Signed)
Summary: Alprazolam Refill  Phone Note Refill Request Message from:  Fax from Pharmacy on January 05, 2010 10:41 AM  Refills Requested: Medication #1:  ALPRAZOLAM 0.5 MG TABS Take 1 tablet by mouth two times a day as needed   Dosage confirmed as above?Dosage Confirmed   Brand Name Necessary? No   Supply Requested: 1 month   Last Refilled: 12/02/2009 [facility] [address] [place] [address] fax [phone].     Method Requested: Electronic Next Appointment Scheduled: 02-25-10 Dr Artist Pais  Initial call taken by: Roselle Locus,  January 05, 2010 10:42 AM  Follow-up for Phone Call        refill x 2  Follow-up by: D. Thomos Lemons DO,  January 05, 2010 2:51 PM  Additional Follow-up for Phone Call Additional follow up Details #1::        Rx called to pharmacy Additional Follow-up by: Glendell Docker CMA,  January 05, 2010 3:10 PM    Prescriptions: ALPRAZOLAM 0.5 MG TABS (ALPRAZOLAM) Take 1 tablet by mouth two times a day as needed  #60 x 2   Entered by:   Glendell Docker CMA   Authorized by:   D. Thomos Lemons DO   Signed by:   Glendell Docker CMA on 01/05/2010   Method used:   Telephoned to ...       [facility] (retail)       [address]       [place], [place]  [address]       Ph: [phone]       Fax: [phone]   RxID:   [id number]

## 2010-03-16 NOTE — Progress Notes (Signed)
  Phone Note Outgoing Call   Summary of Call: call pt - thyroid biopsy negative for cancer Initial call taken by: D. Thomos Lemons DO,  April 06, 2009 4:43 PM  Follow-up for Phone Call        Pt notified as directed   repeat U/S   in July, 2011 Follow-up by: Darral Dash,  April 07, 2009 10:12 AM

## 2010-03-16 NOTE — Progress Notes (Signed)
Summary: Unresolved Urinary symptoms  Phone Note Call from Patient Call back at [phone]   Caller: Patient Call For: D. Thomos Lemons DO Summary of Call: patient called and left voice message stating  she was seen last week and given antibiotics for a sinus infection which she has completed. Her message states she is still having sinus pressure at night and would like to know if Dr Artist Pais would provide another round of antibiotics for her Initial call taken by: Glendell Docker CMA,  May 19, 2009 4:33 PM  Follow-up for Phone Call        please clarify message.  pt seen for UTI,  not sinusitis. what symptoms is pt having Follow-up by: D. Thomos Lemons DO,  May 19, 2009 6:35 PM  Additional Follow-up for Phone Call Additional follow up Details #1::        patient  states  she mis-spoke when the message was left , she has completed the Cipro, however she is still having urinary pressure , urgency and buring ,she is taking Azo otc,she denies temp,  Additional Follow-up by: Glendell Docker CMA,  May 20, 2009 10:09 AM    Additional Follow-up for Phone Call Additional follow up Details #2::    see rx for ceftin.  if persistent, symptoms she will need OV Follow-up by: D. Thomos Lemons DO,  May 20, 2009 12:19 PM  Additional Follow-up for Phone Call Additional follow up Details #3:: Details for Additional Follow-up Action Taken: patient advised per Dr Artist Pais instructions Additional Follow-up by: Glendell Docker CMA,  May 20, 2009 1:26 PM  New/Updated Medications: CEFUROXIME AXETIL 500 MG TABS (CEFUROXIME AXETIL) one by mouth two times a day Prescriptions: CEFUROXIME AXETIL 500 MG TABS (CEFUROXIME AXETIL) one by mouth two times a day  #14 x 0   Entered and Authorized by:   D. Thomos Lemons DO   Signed by:   D. Thomos Lemons DO on 05/20/2009   Method used:   Electronically to        [facility] (retail)       [address]       [place], [place]  [address]       Ph: [phone]       Fax: [phone]   RxID:   [id number]

## 2010-03-16 NOTE — Progress Notes (Signed)
  Phone Note Outgoing Call   Summary of Call: call pt - thyroid u/s is stable.  ongoing surveillance recommended.  repeat U/S in 6 months. Initial call taken by: D. Thomos Lemons DO,  March 02, 2009 5:19 PM  Follow-up for Phone Call        Left message with detail message and a request for return call   Pt called back  informed as directed     Follow-up by: Darral Dash,  March 03, 2009 2:27 PM

## 2010-03-16 NOTE — Progress Notes (Signed)
  Phone Note Outgoing Call   Summary of Call: call pt - nuclear thyroid scan also shows nodule.  fine needle biopsy recommended.  see order Initial call taken by: D. Thomos Lemons DO,  March 24, 2009 5:46 PM  Follow-up for Phone Call        patient advised \\par  [name] [name]  March 25, 2009 3:35 PM

## 2010-03-16 NOTE — Assessment & Plan Note (Signed)
Summary: CPX NO PAP/DT rsch from 08-24-09 bmp/dt   Vital Signs:  Patient profile:   55 year old female Weight:      219.25 pounds BMI:     36.62 O2 Sat:      98 % on Room air Temp:     98.2 degrees F oral Pulse rate:   93 / minute Pulse rhythm:   regular Resp:     18 per minute BP sitting:   108 / 74  (right arm) Cuff size:   large  Vitals Entered By: Glendell Docker CMA (September 02, 2009 8:42 AM)  O2 Flow:  Room air CC: Rm 3- CPX Is Patient Diabetic? No Pain Assessment Patient in pain? no      Comments fasting for labs   Primary Care Provider:  D. Thomos Lemons DO  CC:  Rm 3- CPX.  History of Present Illness: 55 y/o white female with PMHx of :  Obesity, status post bariatric procedure and [place] 2005 GERD  Borderline Hypertension    Allergic rhinitis   Osteoporosis    Elevated Alk Phos - negative work up (presumed secondary to fatty liver) Thyroid nodules 08/2008  Interstitial cystitis - Dr. Normajean Baxter  for routine cpx.  no significant interval hx less stress with daughter,  sertraline helping  PAPs completed by GYN    Preventive Screening-Counseling & Management  Alcohol-Tobacco     Alcohol drinks/day: 0     Smoking Status: never  Caffeine-Diet-Exercise     Caffeine use/day: 1 cup coffee daily     Does Patient Exercise: yes     Type of exercise: walking     Times/week: 7  Allergies: 1)  ! Codeine 2)  ! Cortisone 3)  ! Ibuprofen (Ibuprofen) 4)  Prednisone (Prednisone) 5)  Macrobid  Past History:  Past Medical History: Obesity, status post bariatric procedure and [place] 2005 GERD  Borderline Hypertension    Allergic rhinitis   Osteoporosis    Elevated Alk Phos - negative work up (presumed secondary to fatty liver) Thyroid nodules 08/2008  Interstitial cystitis - Dr. Normajean Baxter  Social History: Caffeine use/day:  1 cup coffee daily Does Patient Exercise:  yes  Review of Systems  The patient denies weight loss, weight gain, chest pain,  dyspnea on exertion, abdominal pain, melena, hematochezia, severe indigestion/heartburn, and depression.    Physical Exam  General:  alert and overweight-appearing.   Head:  normocephalic and atraumatic.   Ears:  R ear normal and L ear normal.   Mouth:  Oral mucosa and oropharynx without lesions or exudates.  Teeth in good repair. Neck:  No deformities, masses, or tenderness noted. Lungs:  Normal respiratory effort, chest expands symmetrically. Lungs are clear to auscultation, no crackles or wheezes. Heart:  Normal rate and regular rhythm. S1 and S2 normal without gallop, murmur, click, rub or other extra sounds. Abdomen:  soft, non-tender, normal bowel sounds, no masses, no hepatomegaly, and no splenomegaly.   Extremities:  ankles puffy but no pitting edema minor varicosities of lower ext Neurologic:  cranial nerves II-XII intact and gait normal.   Psych:  normally interactive, good eye contact, not anxious appearing, and not depressed appearing.     Impression & Recommendations:  Problem # 1:  HEALTH MAINTENANCE EXAM (ICD-V70.0) Reviewed adult health maintenance protocols. PAP as per GYN Pt counseled on diet and exercise.  Orders: EKG w/ Interpretation (93000)  Mammogram: ASSESSMENT: Negative - BI-RADS 1^MM DIGITAL SCREENING (05/07/2009) Pap smear: normal (08/03/2009) Colonoscopy: Location:  [facility].   (  08/04/2006) Bone Density: abnormal (05/30/2006) Td Booster: Tdap (09/02/2009)   Flu Vax: Fluvax Non-MCR (11/25/2008)   Chol: 158 (08/21/2008)   HDL: 47 (08/21/2008)   LDL: 96 (08/21/2008)   TG: 74 (08/21/2008) TSH: 0.521 (09/08/2008)     Complete Medication List: 1)  Amitriptyline Hcl 50 Mg Tabs (Amitriptyline hcl) .... Take 1/2  tablet by mouth once a day 2)  Omeprazole 40 Mg Cpdr (Omeprazole) .Marland Kitchen.. 1 cap by mouth two times a day 3)  Alprazolam 0.5 Mg Tabs (Alprazolam) .... Take 1 tablet by mouth two times a day as needed 4)  Zyrtec Allergy 10 Mg Tabs  (Cetirizine hcl) .... Take 1 tablet by mouth once a day 5)  Tums Ultra 1000 1000 Mg Chew (Calcium carbonate antacid) .... Two times a day 6)  Fosamax 70 Mg Tabs (Alendronate sodium) .... One tablet by mouth once a week 7)  Omnaris 50 Mcg/act Susp (Ciclesonide) .Marland Kitchen.. 1 spray each nostril two times a day 8)  Astepro 0.15 % Soln (Azelastine hcl) .Marland Kitchen.. 1 spray each nostril two times a day 9)  Fish Oil 1000 Mg Caps (Omega-3 fatty acids) .... Take 1 capsule by mouth two times a day 10)  Vitamin D 1000 Unit Tabs (Cholecalciferol) .... Take 2 tablets by mouth once a day 11)  Valtrex 500 Mg Tabs (Valacyclovir hcl) .... Take 1 tablet by mouth once a day as needed 12)  Multivitamins Tabs (Multiple vitamin) .... Take 1 tablet by mouth two times a day 13)  Ra Vitamin A 82956 Unit Caps (Vitamin a) .... Take 1 capsule by mouth once a day 14)  Cranberry 400 Mg Caps (Cranberry) .... Take 1 tablet by mouth once a day 15)  Sertraline Hcl 25 Mg Tabs (Sertraline hcl) .... One by mouth once daily 16)  Furosemide 20 Mg Tabs (Furosemide) .... One by mouth once daily as directed  Other Orders: T-Basic Metabolic Panel [id number]) T-Lipid Profile [id number]) T-TSH [id number]) T-CBC No Diff [phone]) T- * Misc. Laboratory test [phone]) Tdap => 68yrs IM (63875) Admin 1st Vaccine (64332)  Patient Instructions: 1)  Please schedule a follow-up appointment in 6 months. Prescriptions: ALPRAZOLAM 0.5 MG TABS (ALPRAZOLAM) Take 1 tablet by mouth two times a day as needed  #60 x 3   Entered and Authorized by:   D. Thomos Lemons DO   Signed by:   D. Thomos Lemons DO on 09/02/2009   Method used:   Print then Give to Patient   RxID:   [id number] AMITRIPTYLINE HCL 50 MG TABS (AMITRIPTYLINE HCL) Take 1/2  tablet by mouth once a day  #30 x 5   Entered and Authorized by:   D. Thomos Lemons DO   Signed by:   D. Thomos Lemons DO on 09/02/2009   Method used:   Electronically to        [facility] [address] [address] (retail)        [address]       [place], [place]  [address]       Ph: [phone]       Fax: [phone]   RxID:   [id number]   Current Allergies (reviewed today): ! CODEINE ! CORTISONE ! IBUPROFEN (IBUPROFEN) PREDNISONE (PREDNISONE) MACROBID   Preventive Care Screening  Pap Smear:    Date:  08/03/2009    Results:  normal      Immunizations Administered:  Tetanus Vaccine:    Vaccine Type: Tdap  Site: left deltoid    Mfr: GlaxoSmithKline    Dose: 0.5 ml    Route: IM    Given by: Glendell Docker CMA    Exp. Date: 05/08/2011    Lot #: [id number]    VIS given: 01/02/07 version given September 02, 2009.

## 2010-03-16 NOTE — Letter (Signed)
   [place] [facility] [address] [place], [place]  [address]  [place] Phone: [phone]      October 08, 2009   [facility] [address] [place], [place] [address]  RE:  LAB RESULTS  Dear  [name],  The following is an interpretation of your most recent lab tests.  Please take note of any instructions provided or changes to medications that have resulted from your lab work.    Thyroid ultrasound results:    IMPRESSION: Stable thyroid ultrasound examination.  Small nodules bilaterally.       Sincerely Yours,    Dr. Thomos Lemons

## 2010-03-16 NOTE — Assessment & Plan Note (Signed)
Summary: 2 month follow up/mhf   Vital Signs:  Patient profile:   55 year old female Weight:      217.50 pounds BMI:     36.32 O2 Sat:      98 % on Room air Temp:     98.2 degrees F oral Pulse rate:   94 / minute Pulse rhythm:   regular BP sitting:   100 / 80  (right arm) Cuff size:   large  Vitals Entered By: Glendell Docker CMA (March 31, 2009 8:22 AM)  O2 Flow:  Room air  Primary Care Provider:  D. Thomos Lemons DO  CC:  2 Month Follow up.  History of Present Illness: 2 Month Follow up  c/o unresolved nasal congestion, dry sinuses nasal drainage into throat  daughter has bipolar.  substance abuse with marijuana.  tried to commit suicide by taking pills stayed in psych ward for 3 days  her GYN prescribed prozac.  works well for her  Dr. Sharyn Lull - Allergist  Allergies: 1)  ! Codeine 2)  ! Cortisone 3)  ! Ibuprofen (Ibuprofen) 4)  Prednisone (Prednisone)  Past History:  Past Medical History: Obesity, status post bariatric procedure and [place] 2005 GERD  Borderline Hypertension  Allergic rhinitis  Osteoporosis    Elevated Alk Phos - negative work up (presumed secondary to fatty liver) Thyroid nodules 08/2008  Past Surgical History: Left Total knee replacement 2008 Tummy tuck 2007 Gastric bypass 2005   Bilateral Cataract extraction 1997  Left Knee surgery 1997  Lasik eye surgery 2006 Sinus surgery in 1989 - Dr. Lazarus Salines Colonoscopy 2008 - no polyps  Family History: Family History Diabetes 1st degree relative Family History High cholesterol Family History Hypertension Family History of Prostate CA 1st degree relative <50 Colon ca - no          Social History: Occupation: [employer] Office Divorced 1 daughter bipolar 22   Never Smoked    Alcohol use-no     Physical Exam  General:  alert and overweight-appearing.   Ears:  R ear normal and L ear normal.   Nose:  mucosal edema.   Neck:  No deformities, masses, or tenderness noted. Lungs:   normal respiratory effort and normal breath sounds.   Heart:  normal rate, regular rhythm, and no gallop.     Impression & Recommendations:  Problem # 1:  THYROID NODULE, RIGHT (ICD-241.0) She is scheduled for FNA.  thyroid uptake scan showed cold right thyroid nodule.  Problem # 2:  RHINITIS (ICD-472.0) Pt with chronic post nasal gtt.  Pt followed by allergist - Dr Sharyn Lull.  she has been taking omnaris.   add nasal saline irrigation  Complete Medication List: 1)  Amitriptyline Hcl 50 Mg Tabs (Amitriptyline hcl) .... Take 1 tablet by mouth once a day 2)  Omeprazole 40 Mg Cpdr (Omeprazole) .... 2 caps by mouth two times a day 3)  Alprazolam 0.5 Mg Tabs (Alprazolam) .... Take 1 tablet by mouth two times a day as needed 4)  Zyrtec Allergy 10 Mg Tabs (Cetirizine hcl) .... Take 1 tablet by mouth once a day 5)  Tums Ultra 1000 1000 Mg Chew (Calcium carbonate antacid) .... Two times a day 6)  Actonel 150 Mg Tabs (Risedronate sodium) .... Once a month 7)  Omnaris 50 Mcg/act Susp (Ciclesonide) .Marland Kitchen.. 1 spray each nostril two times a day 8)  Astepro 0.15 % Soln (Azelastine hcl) .Marland Kitchen.. 1 spray each nostril two times a day 9)  Fish Oil 1000 Mg Caps (  Omega-3 fatty acids) .... Take 1 capsule by mouth two times a day 10)  Vitamin D 1000 Unit Tabs (Cholecalciferol) .... Take 2 tablets by mouth once a day 11)  Valtrex 500 Mg Tabs (Valacyclovir hcl) .... Take 1 tablet by mouth once a day as needed 12)  Multivitamins Tabs (Multiple vitamin) .... Take 1 tablet by mouth two times a day 13)  Prozac 10 Mg Caps (Fluoxetine hcl) .... Take 1 capsule by mouth once a day 14)  Ra Vitamin A 04540 Unit Caps (Vitamin a) .... Take 1 capsule by mouth once a day  Patient Instructions: 1)  Use Lloyd Huger Med sinus rinse 2)  Please schedule a follow-up appointment in 4 months.  Current Allergies (reviewed today): ! CODEINE ! CORTISONE ! IBUPROFEN (IBUPROFEN) PREDNISONE (PREDNISONE)

## 2010-03-16 NOTE — Miscellaneous (Signed)
Summary: urine culture   Clinical Lists Changes  Orders: Added new Service order of Specimen Handling ([id number]) - Signed Added new Test order of T-Culture, Urine ([id number]) - Signed

## 2010-03-16 NOTE — Assessment & Plan Note (Signed)
Summary: FLUID BUILD UP IN LEGS AND FEET /HEA   Vital Signs:  Patient profile:   55 year old female Height:      65 inches Weight:      222.50 pounds BMI:     37.16 O2 Sat:      100 % on Room air Temp:     98.1 degrees F oral Pulse rate:   114 / minute Pulse rhythm:   irregular Resp:     18 per minute BP sitting:   110 / 74  (right arm) Cuff size:   large  Vitals Entered By: Glendell Docker CMA (Jul 10, 2009 8:48 AM)  O2 Flow:  Room air CC: Rm 2 swelling   Primary Care Provider:  Dondra Spry DO  CC:  Rm 2 swelling.  History of Present Illness: 55 y/o white female with hx of morbid obesity (s/p gastric bypass) c/o swelling in  right foot and ankle for the past week  Allergies: 1)  ! Codeine 2)  ! Cortisone 3)  ! Ibuprofen (Ibuprofen) 4)  Prednisone (Prednisone) 5)  Macrobid  Past History:  Past Medical History: Obesity, status post bariatric procedure and [place] 2005 GERD  Borderline Hypertension  Allergic rhinitis   Osteoporosis    Elevated Alk Phos - negative work up (presumed secondary to fatty liver) Thyroid nodules 08/2008 Interstitial cystitis - Dr. Normajean Baxter  Past Surgical History: Left Total knee replacement 2008 Tummy tuck 2007 Gastric bypass 2005   Bilateral Cataract extraction 1997  Left Knee surgery 1997   Lasik eye surgery 2006 Sinus surgery in 1989 - Dr. Lazarus Salines Colonoscopy 2008 - no polyps  Social History: Occupation: [employer] Divorced 1 daughter bipolar 22   Never Smoked    Alcohol use-no        Physical Exam  General:  alert, well-developed, and well-nourished.   Lungs:  normal respiratory effort and normal breath sounds.   Heart:  regular rhythm, no gallop, and tachycardia.   Extremities:  trace left pedal edema and trace right pedal edema.     Impression & Recommendations:  Problem # 1:  HYPERTENSION, BORDERLINE (ICD-401.9) Pt with lower ext edema.  likely related to obesity.  pt advised to follow low salt  diet.  use lasix as directed.  Her updated medication list for this problem includes:    Furosemide 20 Mg Tabs (Furosemide) ..... One by mouth once daily as directed  BP today: 110/74 Prior BP: 122/86 (05/14/2009)  Labs Reviewed: K+: 4.2 (10/15/2008) Creat: : 0.7 (10/15/2008)   Chol: 158 (08/21/2008)   HDL: 47 (08/21/2008)   LDL: 96 (08/21/2008)   TG: 74 (08/21/2008)  Complete Medication List: 1)  Amitriptyline Hcl 50 Mg Tabs (Amitriptyline hcl) .... Take 1/2  tablet by mouth once a day 2)  Omeprazole 40 Mg Cpdr (Omeprazole) .... 2 caps by mouth two times a day 3)  Alprazolam 0.5 Mg Tabs (Alprazolam) .... Take 1 tablet by mouth two times a day as needed 4)  Zyrtec Allergy 10 Mg Tabs (Cetirizine hcl) .... Take 1 tablet by mouth once a day 5)  Tums Ultra 1000 1000 Mg Chew (Calcium carbonate antacid) .... Two times a day 6)  Actonel 150 Mg Tabs (Risedronate sodium) .... Once a month 7)  Omnaris 50 Mcg/act Susp (Ciclesonide) .Marland Kitchen.. 1 spray each nostril two times a day 8)  Astepro 0.15 % Soln (Azelastine hcl) .Marland Kitchen.. 1 spray each nostril two times a day 9)  Fish Oil 1000 Mg Caps (Omega-3 fatty acids) .Marland KitchenMarland KitchenMarland Kitchen  Take 1 capsule by mouth two times a day 10)  Vitamin D 1000 Unit Tabs (Cholecalciferol) .... Take 2 tablets by mouth once a day 11)  Valtrex 500 Mg Tabs (Valacyclovir hcl) .... Take 1 tablet by mouth once a day as needed 12)  Multivitamins Tabs (Multiple vitamin) .... Take 1 tablet by mouth two times a day 13)  Ra Vitamin A 04540 Unit Caps (Vitamin a) .... Take 1 capsule by mouth once a day 14)  Cranberry 400 Mg Caps (Cranberry) .... Take 1 tablet by mouth once a day 15)  Sertraline Hcl 25 Mg Tabs (Sertraline hcl) .... 1/2 by mouth once daily x 7 days, then one by mouth qd 16)  Furosemide 20 Mg Tabs (Furosemide) .... One by mouth once daily as directed  Patient Instructions: 1)  Keep your next appointment 2)  Low salt diet - 2 to 2.5 grams per day Prescriptions: FUROSEMIDE 20 MG TABS  (FUROSEMIDE) one by mouth once daily as directed  #30 x 3   Entered and Authorized by:   D. Thomos Lemons DO   Signed by:   D. Thomos Lemons DO on 07/10/2009   Method used:   Electronically to        [facility] (retail)       [address]       [place], [place]  [address]       Ph: [phone]       Fax: [phone]   RxID:   [id number] SERTRALINE HCL 25 MG TABS (SERTRALINE HCL) 1/2 by mouth once daily x 7 days, then one by mouth qd  #30 x 3   Entered and Authorized by:   D. Thomos Lemons DO   Signed by:   D. Thomos Lemons DO on 07/10/2009   Method used:   Electronically to        [facility] (retail)       [address]       [place], [place]  [address]       Ph: [phone]       Fax: [phone]   RxID:   [id number]   Current Allergies (reviewed today): ! CODEINE ! CORTISONE ! IBUPROFEN (IBUPROFEN) PREDNISONE (PREDNISONE) MACROBID

## 2010-03-16 NOTE — Assessment & Plan Note (Signed)
Summary: sinus infection/mhf--Rm 3   Vital Signs:  Patient profile:   55 year old female Height:      65 inches Weight:      224 pounds BMI:     37.41 Temp:     98.1 degrees F oral Pulse rate:   72 / minute Pulse rhythm:   regular Resp:     16 per minute BP sitting:   104 / 78  (right arm) Cuff size:   large  Vitals Entered By: Mervin Kung CMA Duncan Dull) (December 01, 2009 1:24 PM) CC: Rm 3  Pt has had head congestion, headache and ears buzzing since Friday, sinus drainage x 1 day. Has tried OTC Zyrtec D with little relief. Is Patient Diabetic? No Comments Pt agrees all med doses and directions are correct. Nicki Guadalajara Fergerson CMA Duncan Dull)  December 01, 2009 1:32 PM    Primary Care Provider:  D. Thomos Lemons DO  CC:  Rm 3  Pt has had head congestion, headache and ears buzzing since Friday, and sinus drainage x 1 day. Has tried OTC Zyrtec D with little relief..  History of Present Illness: 55 y/o white female c/o sinus congestion onset last Friday tried zyrtec D on Sat - no significant improvement   Preventive Screening-Counseling & Management  Alcohol-Tobacco     Alcohol drinks/day: 0     Alcohol Counseling: not indicated; patient does not drink     Smoking Status: never     Tobacco Counseling: not indicated; no tobacco use  Allergies: 1)  ! Codeine 2)  ! Cortisone 3)  ! Ibuprofen (Ibuprofen) 4)  Prednisone (Prednisone) 5)  Macrobid  Past History:  Past Medical History: Obesity, status post bariatric procedure and [place] 2005 GERD  Borderline Hypertension    Allergic rhinitis    Osteoporosis    Elevated Alk Phos - negative work up (presumed secondary to fatty liver) Thyroid nodules 08/2008  Interstitial cystitis - Dr. Normajean Baxter  Past Surgical History: Left Total knee replacement 2008 Tummy tuck 2007 Gastric bypass 2005   Bilateral Cataract extraction 1997   Left Knee surgery 1997   Lasik eye surgery 2006  Sinus surgery in 1989 - Dr. Lazarus Salines Colonoscopy  2008 - no polyps  Physical Exam  General:  alert and overweight-appearing.   Ears:  right and left TM retracted.  no redness Nose:  mucosal erythema and mucosal edema.   Mouth:  pharynx pink and moist.     Impression & Recommendations:  Problem # 1:  SINUSITIS (ICD-473.9) pt to try conservative measures first - nasal saline rinse and mucinex if persistent symptoms,  use cefin as directed Patient advised to call office if symptoms persist or worsen.  Her updated medication list for this problem includes:    Zyrtec-d Allergy & Congestion 5-120 Mg Xr12h-tab (Cetirizine-pseudoephedrine) ..... One by mouth two times a day as needed    Omnaris 50 Mcg/act Susp (Ciclesonide) .Marland Kitchen... 1 spray each nostril two times a day    Astepro 0.15 % Soln (Azelastine hcl) .Marland Kitchen... 1 spray each nostril two times a day    Cefuroxime Axetil 500 Mg Tabs (Cefuroxime axetil) ..... One by mouth two times a day    Mucinex 600 Mg Xr12h-tab (Guaifenesin) ..... One by mouth two times a day  Complete Medication List: 1)  Amitriptyline Hcl 50 Mg Tabs (Amitriptyline hcl) .... Take 1/2  tablet by mouth once a day 2)  Omeprazole 40 Mg Cpdr (Omeprazole) .Marland Kitchen.. 1 cap by mouth two times a day 3)  Alprazolam 0.5 Mg Tabs (Alprazolam) .... Take 1 tablet by mouth two times a day as needed 4)  Zyrtec-d Allergy & Congestion 5-120 Mg Xr12h-tab (Cetirizine-pseudoephedrine) .... One by mouth two times a day as needed 5)  Tums Ultra 1000 1000 Mg Chew (Calcium carbonate antacid) .... Two times a day 6)  Fosamax 70 Mg Tabs (Alendronate sodium) .... One tablet by mouth once a week 7)  Omnaris 50 Mcg/act Susp (Ciclesonide) .Marland Kitchen.. 1 spray each nostril two times a day 8)  Astepro 0.15 % Soln (Azelastine hcl) .Marland Kitchen.. 1 spray each nostril two times a day 9)  Fish Oil 1000 Mg Caps (Omega-3 fatty acids) .... Take 1 capsule by mouth two times a day 10)  Vitamin D 1000 Unit Tabs (Cholecalciferol) .... Take 2 tablets by mouth once a day 11)  Valtrex 500 Mg  Tabs (Valacyclovir hcl) .... Take 1 tablet by mouth once a day as needed 12)  Multivitamins Tabs (Multiple vitamin) .... Take 1 tablet by mouth two times a day 13)  Ra Vitamin A 40981 Unit Caps (Vitamin a) .... Take 1 capsule by mouth once a day 14)  Cranberry 400 Mg Caps (Cranberry) .... Take 1 tablet by mouth once a day 15)  Sertraline Hcl 25 Mg Tabs (Sertraline hcl) .... One by mouth once daily 16)  Furosemide 20 Mg Tabs (Furosemide) .... One by mouth once daily as directed 17)  Cefuroxime Axetil 500 Mg Tabs (Cefuroxime axetil) .... One by mouth two times a day 18)  Mucinex 600 Mg Xr12h-tab (Guaifenesin) .... One by mouth two times a day  Patient Instructions: 1)  Call our office if your symptoms do not  improve or gets worse. Prescriptions: ZYRTEC-D ALLERGY & CONGESTION 5-120 MG XR12H-TAB (CETIRIZINE-PSEUDOEPHEDRINE) one by mouth two times a day as needed  #30 x 0   Entered and Authorized by:   D. Thomos Lemons DO   Signed by:   D. Thomos Lemons DO on 12/01/2009   Method used:   Print then Give to Patient   RxID:   [id number] MUCINEX 600 MG XR12H-TAB (GUAIFENESIN) one by mouth two times a day  #30 x 0   Entered and Authorized by:   D. Thomos Lemons DO   Signed by:   D. Thomos Lemons DO on 12/01/2009   Method used:   Print then Give to Patient   RxID:   [id number] CEFUROXIME AXETIL 500 MG TABS (CEFUROXIME AXETIL) one by mouth two times a day  #20 x 0   Entered and Authorized by:   D. Thomos Lemons DO   Signed by:   D. Thomos Lemons DO on 12/01/2009   Method used:   Print then Give to Patient   RxID:   [id number]    Orders Added: 1)  Est. Patient Level III [47425]

## 2010-03-16 NOTE — Letter (Signed)
   [place] [facility] [address] [place], [place]  [address]  [place] Phone: [phone]      September 03, 2009   [facility] [address] [place], [place] [address]  RE:  LAB RESULTS  Dear  [name],  The following is an interpretation of your most recent lab tests.  Please take note of any instructions provided or changes to medications that have resulted from your lab work.  ELECTROLYTES:  Good - no changes needed  KIDNEY FUNCTION TESTS:  Good - no changes needed  LIVER FUNCTION TESTS:  Good - no changes needed  LIPID PANEL:  Good - no changes needed Triglyceride: 68   Cholesterol: 187   LDL: 110   HDL: 63   Chol/HDL%:  3.0 Ratio  THYROID STUDIES:  Thyroid studies normal TSH: 0.660     CBC:  Good - no changes needed  Vitamin D level:  normal       Sincerely Yours,    Dr. Thomos Lemons  Appended Document:  Mailed.

## 2010-03-16 NOTE — Assessment & Plan Note (Signed)
Summary: flu shot/dt  Nurse Visit   Vital Signs:  Patient profile:   55 year old female Temp:     98.1 degrees F oral  Vitals Entered By: Mervin Kung CMA Duncan Dull) (November 12, 2009 8:18 AM)  Allergies: 1)  ! Codeine 2)  ! Cortisone 3)  ! Ibuprofen (Ibuprofen) 4)  Prednisone (Prednisone) 5)  Macrobid  Orders Added: 1)  Admin 1st Vaccine [90471] 2)  Flu Vaccine 5yrs + [11914]  Flu Vaccine Consent Questions     Do you have a history of severe allergic reactions to this vaccine? no    Any prior history of allergic reactions to egg and/or gelatin? no    Do you have a sensitivity to the preservative Thimersol? no    Do you have a past history of Guillan-Barre Syndrome? no    Do you currently have an acute febrile illness? no    Have you ever had a severe reaction to latex? no    Vaccine information given and explained to patient? yes    Are you currently pregnant? no    Lot Number:[id number]   Exp Date:08/14/2010   Site Given  Left Deltoid IM.  Nicki Guadalajara Fergerson CMA Duncan Dull)  November 12, 2009 8:18 AM

## 2010-03-16 NOTE — Progress Notes (Signed)
Summary: lab reminder   Phone Note Outgoing Call Call back at [facility] Phone [phone]   Call placed by: Chales Abrahams CMA Duncan Dull),  July 24, 2009 9:52 AM Summary of Call: pt called and reminded to have labs Initial call taken by: Chales Abrahams CMA Duncan Dull),  July 24, 2009 9:52 AM

## 2010-03-16 NOTE — Progress Notes (Signed)
Summary: Status Update  Phone Note Call from Patient Call back at [phone]   Caller: Patient Summary of Call: 10:14am patient called and left voice message stating she was seen on the 27th of December for a sinus infection. Today is the last day of antibiotics and she states she is not feeling any better, she is still having head congestion, and sinus headaches. Her message states that she was advised to call back for another round of antibiotics if there was no improvement Initial call taken by: Glendell Docker CMA,  February 17, 2009 4:04 PM  Follow-up for Phone Call        pt. called back and stated that she was just wanting to make sure she heard something back from the Dr.. She states she has NO fever and there is some improvement, but feels like she needs a little more meds. Please call her back.Michaelle Copas  February 17, 2009 4:18 PM   Additional Follow-up for Phone Call Additional follow up Details #1::        take additional 1 week of ceftin.  see rx Additional Follow-up by: D. Thomos Lemons DO,  February 17, 2009 4:59 PM    Additional Follow-up for Phone Call Additional follow up Details #2::    patient advised per Dr Artist Pais instructions Follow-up by: Glendell Docker CMA,  February 17, 2009 5:08 PM  Prescriptions: CEFUROXIME AXETIL 500 MG TABS (CEFUROXIME AXETIL) one by mouth two times a day  #14 x 0   Entered and Authorized by:   D. Thomos Lemons DO   Signed by:   D. Thomos Lemons DO on 02/17/2009   Method used:   Electronically to        [facility] (retail)       [address]       [place], [place]  [address]       Ph: [phone]       Fax: [phone]   RxID:   [id number]

## 2010-03-16 NOTE — Assessment & Plan Note (Signed)
Summary: uti?/mhf--Rm 3   Vital Signs:  Patient profile:   55 year old female Height:      65 inches Weight:      222.50 pounds BMI:     37.16 Temp:     97.9 degrees F oral Pulse rate:   84 / minute Pulse rhythm:   regular Resp:     16 per minute BP sitting:   126 / 80  (right arm) Cuff size:   large  Vitals Entered By: Mervin Kung CMA (July 24, 2009 1:24 PM) CC: Room 3  Pt states she has been having burning with urination and frequency., Dysuria Is Patient Diabetic? No   Primary Care Provider:  Dondra Spry DO  CC:  Room 3  Pt states she has been having burning with urination and frequency. and Dysuria.  History of Present Illness:  Dysuria      This is a 55 year old woman who presents with Dysuria.  The symptoms began 5 days ago.  The patient reports burning with urination, but denies hematuria.  The patient denies the following associated symptoms: fever, shaking chills, and flank pain.    anxiety - better since starting sertraline   Allergies: 1)  ! Codeine 2)  ! Cortisone 3)  ! Ibuprofen (Ibuprofen) 4)  Prednisone (Prednisone) 5)  Macrobid  Past History:  Past Medical History: Obesity, status post bariatric procedure and [place] 2005 GERD  Borderline Hypertension   Allergic rhinitis   Osteoporosis    Elevated Alk Phos - negative work up (presumed secondary to fatty liver) Thyroid nodules 08/2008 Interstitial cystitis - Dr. Normajean Baxter  Past Surgical History: Left Total knee replacement 2008 Tummy tuck 2007 Gastric bypass 2005   Bilateral Cataract extraction 1997   Left Knee surgery 1997   Lasik eye surgery 2006 Sinus surgery in 1989 - Dr. Lazarus Salines Colonoscopy 2008 - no polyps  Family History: Family History Diabetes 1st degree relative Family History High cholesterol Family History Hypertension Family History of Prostate CA 1st degree relative <50 Colon ca - no            Social History: Occupation: [employer] Divorced 1 daughter  bipolar 22   Never Smoked     Alcohol use-no        Physical Exam  General:  alert, well-developed, and well-nourished.   Lungs:  normal respiratory effort and normal breath sounds.   Heart:  normal rate, regular rhythm, and no gallop.   Abdomen:  soft, non-tender, no masses, no guarding, and no rigidity.   Extremities:  No lower extremity edema    Impression & Recommendations:  Problem # 1:  UTI (ICD-599.0) dysuria pressure sensation x 5 days.  no fever or flank pain.  send urine cx.  persistent symptoms with cipro with prev UTI. tx with ceftin.  Patient advised to call office if symptoms persist or worsen.  Her updated medication list for this problem includes:    Cefuroxime Axetil 500 Mg Tabs (Cefuroxime axetil) ..... One by mouth two times a day  Problem # 2:  ANXIETY (ICD-300.00) Assessment: Improved Good response to sertraline.  less anxiety.  use alprazolam for panic symptoms only.  we discussed deep breathing relaxation techniques  Her updated medication list for this problem includes:    Amitriptyline Hcl 50 Mg Tabs (Amitriptyline hcl) .Marland Kitchen... Take 1/2  tablet by mouth once a day    Alprazolam 0.5 Mg Tabs (Alprazolam) .Marland Kitchen... Take 1 tablet by mouth two times a day as needed  Sertraline Hcl 25 Mg Tabs (Sertraline hcl) ..... One by mouth once daily  Complete Medication List: 1)  Amitriptyline Hcl 50 Mg Tabs (Amitriptyline hcl) .... Take 1/2  tablet by mouth once a day 2)  Omeprazole 40 Mg Cpdr (Omeprazole) .... 2 caps by mouth two times a day 3)  Alprazolam 0.5 Mg Tabs (Alprazolam) .... Take 1 tablet by mouth two times a day as needed 4)  Zyrtec Allergy 10 Mg Tabs (Cetirizine hcl) .... Take 1 tablet by mouth once a day 5)  Tums Ultra 1000 1000 Mg Chew (Calcium carbonate antacid) .... Two times a day 6)  Actonel 150 Mg Tabs (Risedronate sodium) .... Once a month 7)  Omnaris 50 Mcg/act Susp (Ciclesonide) .Marland Kitchen.. 1 spray each nostril two times a day 8)  Astepro 0.15 % Soln  (Azelastine hcl) .Marland Kitchen.. 1 spray each nostril two times a day 9)  Fish Oil 1000 Mg Caps (Omega-3 fatty acids) .... Take 1 capsule by mouth two times a day 10)  Vitamin D 1000 Unit Tabs (Cholecalciferol) .... Take 2 tablets by mouth once a day 11)  Valtrex 500 Mg Tabs (Valacyclovir hcl) .... Take 1 tablet by mouth once a day as needed 12)  Multivitamins Tabs (Multiple vitamin) .... Take 1 tablet by mouth two times a day 13)  Ra Vitamin A 25956 Unit Caps (Vitamin a) .... Take 1 capsule by mouth once a day 14)  Cranberry 400 Mg Caps (Cranberry) .... Take 1 tablet by mouth once a day 15)  Sertraline Hcl 25 Mg Tabs (Sertraline hcl) .... One by mouth once daily 16)  Furosemide 20 Mg Tabs (Furosemide) .... One by mouth once daily as directed 17)  Cefuroxime Axetil 500 Mg Tabs (Cefuroxime axetil) .... One by mouth two times a day  Other Orders: UA Dipstick w/o Micro (manual) (38756)  Patient Instructions: 1)  Call our office if your symptoms do not  improve or gets worse. Prescriptions: SERTRALINE HCL 25 MG TABS (SERTRALINE HCL) one by mouth once daily  #30 x 5   Entered and Authorized by:   D. Thomos Lemons DO   Signed by:   D. Thomos Lemons DO on 07/24/2009   Method used:   Electronically to        [facility] (retail)       [address]       [place], [place]  [address]       Ph: [phone]       Fax: [phone]   RxID:   [id number] FUROSEMIDE 20 MG TABS (FUROSEMIDE) one by mouth once daily as directed  #30 x 3   Entered and Authorized by:   D. Thomos Lemons DO   Signed by:   D. Thomos Lemons DO on 07/24/2009   Method used:   Electronically to        [facility] (retail)       [address]       [place], [place]  [address]       Ph: [phone]       Fax: [phone]   RxID:   [id number] CEFUROXIME AXETIL 500 MG TABS (CEFUROXIME AXETIL) one by mouth two times a day  #14 x 0   Entered and  Authorized by:   D. Thomos Lemons DO   Signed by:   D. Thomos Lemons DO  on 07/24/2009   Method used:   Electronically to        [facility] (retail)       [address]       [place], [place]  [address]       Ph: [phone]       Fax: [phone]   RxID:   [id number]   Current Allergies (reviewed today): ! CODEINE ! CORTISONE ! IBUPROFEN (IBUPROFEN) PREDNISONE (PREDNISONE) MACROBID  Laboratory Results   Urine Tests    Routine Urinalysis   Color: straw Appearance: Clear Glucose: negative   (Normal Range: Negative) Bilirubin: negative   (Normal Range: Negative) Ketone: negative   (Normal Range: Negative) Spec. Gravity: <1.005   (Normal Range: 1.003-1.035) Blood: negative   (Normal Range: Negative) pH: 6.5   (Normal Range: 5.0-8.0) Protein: negative   (Normal Range: Negative) Urobilinogen: 0.2   (Normal Range: 0-1) Nitrite: negative   (Normal Range: Negative) Leukocyte Esterace: negative   (Normal Range: Negative)

## 2010-03-17 HISTORY — PX: SHOULDER SURGERY: SHX246

## 2010-03-18 NOTE — Progress Notes (Signed)
Summary: Alprazolam Refill  Phone Note Refill Request Message from:  Pharmacy on March 08, 2010 11:25 AM  Refills Requested: Medication #1:  ALPRAZOLAM 0.5 MG TABS Take 1 tablet by mouth two times a day as needed   Dosage confirmed as above?Dosage Confirmed   Brand Name Necessary? No   Supply Requested: 1 month   Last Refilled: 02/02/2010 [facility] [address] [place] fax [phone]   Method Requested: Electronic Next Appointment Scheduled: none Initial call taken by: Roselle Locus,  March 08, 2010 11:25 AM  Follow-up for Phone Call        ok to refill x 2 Follow-up by: D. Thomos Lemons DO,  March 08, 2010 5:19 PM  Additional Follow-up for Phone Call Additional follow up Details #1::        Refill left on pharmacy voicemail. Nicki Guadalajara Fergerson CMA Duncan Dull)  March 09, 2010 8:54 AM     Prescriptions: ALPRAZOLAM 0.5 MG TABS (ALPRAZOLAM) Take 1 tablet by mouth two times a day as needed  #60 x 1   Entered by:   Mervin Kung CMA (AAMA)   Authorized by:   D. Thomos Lemons DO   Signed by:   Mervin Kung CMA (AAMA) on 03/09/2010   Method used:   Telephoned to ...       [facility] (retail)       [address]       [place], [place]  [address]       Ph: [phone]       Fax: [phone]   RxID:   [id number]

## 2010-04-01 ENCOUNTER — Encounter (HOSPITAL_COMMUNITY): Payer: PRIVATE HEALTH INSURANCE

## 2010-04-01 ENCOUNTER — Other Ambulatory Visit: Payer: Self-pay | Admitting: Specialist

## 2010-04-01 ENCOUNTER — Other Ambulatory Visit (HOSPITAL_COMMUNITY): Payer: Self-pay | Admitting: Specialist

## 2010-04-01 ENCOUNTER — Ambulatory Visit (HOSPITAL_COMMUNITY)
Admission: RE | Admit: 2010-04-01 | Discharge: 2010-04-01 | Disposition: A | Discharge status: Home / Self Care | Payer: PRIVATE HEALTH INSURANCE | Source: Ambulatory Visit | Attending: Specialist | Admitting: Specialist

## 2010-04-01 DIAGNOSIS — Z01818 Encounter for other preprocedural examination: Secondary | ICD-10-CM

## 2010-04-01 LAB — CBC
HCT: 38.2 % (ref 36.0–46.0)
Hemoglobin: 11.6 g/dL — ABNORMAL LOW (ref 12.0–15.0)
MCH: 25.2 pg — ABNORMAL LOW (ref 26.0–34.0)
MCHC: 30.4 g/dL (ref 30.0–36.0)
MCV: 83 fL (ref 78.0–100.0)

## 2010-04-01 LAB — BASIC METABOLIC PANEL
CO2: 27 mEq/L (ref 19–32)
Calcium: 9.2 mg/dL (ref 8.4–10.5)
Chloride: 107 mEq/L (ref 96–112)
Glucose, Bld: 88 mg/dL (ref 70–99)
Sodium: 143 mEq/L (ref 135–145)

## 2010-04-01 LAB — SURGICAL PCR SCREEN: Staphylococcus aureus: POSITIVE — AB

## 2010-04-01 NOTE — Assessment & Plan Note (Signed)
Summary: f/u for surgical clearance / tf,cma   Vital Signs:  Patient profile:   55 year old female Height:      65 inches Weight:      225.75 pounds BMI:     37.70 O2 Sat:      99 % on Room air Temp:     98.4 degrees F oral Pulse rate:   97 / minute Resp:     18 per minute BP sitting:   100 / 80  (left arm) Cuff size:   large  Vitals Entered By: Glendell Docker CMA (March 11, 2010 2:36 PM)  O2 Flow:  Room air CC: Surgical Clearance, Pre-op Evaluation Is Patient Diabetic? No Pain Assessment Patient in pain? no      Comments fell on Jan 3rd, knocked shoulder of her socket, urinary pain, discuss thyroid   Primary Care Provider:  Dondra Spry DO  CC:  Surgical Clearance and Pre-op Evaluation.  History of Present Illness: 55 y/o female for pre op eval  Jan 3 rd - she fell in kitchen over box injuring right shoulder  The patient denies chest pain, heavy ETOH use, and smoking.  There is no history of antiplatelet agents.    recent URI,  nasal congestion. no fever.  no SOB  Preventive Screening-Counseling & Management  Alcohol-Tobacco     Smoking Status: never  Allergies: 1)  ! Codeine 2)  ! Cortisone 3)  ! Ibuprofen (Ibuprofen) 4)  Prednisone (Prednisone) 5)  Macrobid  Past History:  Past Medical History: Obesity, status post bariatric procedure and [place] 2005 GERD  Borderline Hypertension      Allergic rhinitis    Osteoporosis    Elevated Alk Phos - negative work up (presumed secondary to fatty liver) Thyroid nodules 08/2008  Interstitial cystitis - Dr. Normajean Baxter  Past Surgical History: Left Total knee replacement 2008 Tummy tuck 2007 Gastric bypass 2005    Bilateral Cataract extraction 1997   Left Knee surgery 1997   Lasik eye surgery 2006  Sinus surgery in 1989 - Dr. Lazarus Salines Colonoscopy 2008 - no polyps   Family History: Family History Diabetes 1st degree relative Family History High cholesterol Family History Hypertension Family  History of Prostate CA 1st degree relative <50 Colon ca - no             Social History: Occupation: [employer] Divorced 1 daughter bipolar 22   Never Smoked     Alcohol use-no         Review of Systems       increased urinary freq  Physical Exam  General:  alert and overweight-appearing.   Mouth:  pharynx pink and moist.   Neck:  No deformities, masses, or tenderness noted. Lungs:  Normal respiratory effort, chest expands symmetrically. Lungs are clear to auscultation, no crackles or wheezes. Heart:  Normal rate and regular rhythm. S1 and S2 normal without gallop, murmur, click, rub or other extra sounds. Extremities:  trace left pedal edema and trace right pedal edema.   Psych:  normally interactive, good eye contact, not anxious appearing, and not depressed appearing.     Impression & Recommendations:  Problem # 1:  PREOPERATIVE EXAMINATION (ICD-V72.84) EKG shows NSR and RBBB  no further pre op testing recommended  Problem # 2:  URI (ICD-465.9)  The following medications were removed from the medication list:    Mucinex 600 Mg Xr12h-tab (Guaifenesin) ..... One by mouth two times a day Her updated medication list for this problem includes:  Zyrtec-d Allergy & Congestion 5-120 Mg Xr12h-tab (Cetirizine-pseudoephedrine) ..... One by mouth two times a day as needed  Instructed on symptomatic treatment. Call if symptoms persist or worsen.   Problem # 3:  UTI (ICD-599.0)  The following medications were removed from the medication list:    Cefuroxime Axetil 500 Mg Tabs (Cefuroxime axetil) ..... One by mouth two times a day Her updated medication list for this problem includes:    Cefuroxime Axetil 500 Mg Tabs (Cefuroxime axetil) ..... One by mouth two times a day  Patient advised to call office if symptoms persist or worsen.  Complete Medication List: 1)  Amitriptyline Hcl 50 Mg Tabs (Amitriptyline hcl) .... Take 1/2  tablet by mouth once a day 2)  Omeprazole  40 Mg Cpdr (Omeprazole) .Marland Kitchen.. 1 cap by mouth two times a day 3)  Alprazolam 0.5 Mg Tabs (Alprazolam) .... Take 1 tablet by mouth two times a day as needed 4)  Zyrtec-d Allergy & Congestion 5-120 Mg Xr12h-tab (Cetirizine-pseudoephedrine) .... One by mouth two times a day as needed 5)  Tums Ultra 1000 1000 Mg Chew (Calcium carbonate antacid) .... Two times a day 6)  Fosamax 70 Mg Tabs (Alendronate sodium) .... One tablet by mouth once a week 7)  Omnaris 50 Mcg/act Susp (Ciclesonide) .Marland Kitchen.. 1 spray each nostril two times a day 8)  Astepro 0.15 % Soln (Azelastine hcl) .Marland Kitchen.. 1 spray each nostril two times a day 9)  Fish Oil 1000 Mg Caps (Omega-3 fatty acids) .... Take 1 capsule by mouth two times a day 10)  Vitamin D 1000 Unit Tabs (Cholecalciferol) .... Take 2 tablets by mouth once a day 11)  Valtrex 500 Mg Tabs (Valacyclovir hcl) .... Take 1 tablet by mouth once a day as needed 12)  Multivitamins Tabs (Multiple vitamin) .... Take 1 tablet by mouth two times a day 13)  Ra Vitamin A 81191 Unit Caps (Vitamin a) .... Take 1 capsule by mouth once a day 14)  Cranberry 400 Mg Caps (Cranberry) .... Take 1 tablet by mouth once a day 15)  Sertraline Hcl 25 Mg Tabs (Sertraline hcl) .... One by mouth once daily 16)  Furosemide 20 Mg Tabs (Furosemide) .... One by mouth once daily as directed 17)  Percocet 5-325 Mg Tabs (Oxycodone-acetaminophen) .... One tablet by mouth every 6 hours as needed pain 18)  Cefuroxime Axetil 500 Mg Tabs (Cefuroxime axetil) .... One by mouth two times a day  Other Orders: UA Dipstick w/o Micro (manual) (47829)  Patient Instructions: 1)  Stop fish oil supplement before shoulder operation Prescriptions: CEFUROXIME AXETIL 500 MG TABS (CEFUROXIME AXETIL) one by mouth two times a day  #14 x 0   Entered and Authorized by:   D. Thomos Lemons DO   Signed by:   D. Thomos Lemons DO on 03/11/2010   Method used:   Electronically to        [facility] [address] (retail)       [address]       [place], [place]  [address]       Ph: [phone]       Fax: [phone]   RxID:   [id number]    Orders Added: 1)  UA Dipstick w/o Micro (manual) [81002] 2)  Est. Patient Level III [74259]    Current Allergies (reviewed today): ! CODEINE ! CORTISONE ! IBUPROFEN (IBUPROFEN) PREDNISONE (PREDNISONE) MACROBID    Laboratory Results  Urine Tests    Routine Urinalysis   Color: yellow Appearance: Clear Glucose: negative   (Normal Range: Negative) Bilirubin: negative   (Normal Range: Negative) Ketone: negative   (Normal Range: Negative) Spec. Gravity: 1.010   (Normal Range: 1.003-1.035) Blood: negative   (Normal Range: Negative) pH: 5.0   (Normal Range: 5.0-8.0) Protein: negative   (Normal Range: Negative) Urobilinogen: 0.2   (Normal Range: 0-1) Nitrite: negative   (Normal Range: Negative) Leukocyte Esterace: moderate   (Normal Range: Negative)

## 2010-04-01 NOTE — Letter (Signed)
Summary: Surgical Clearance/ Orthopaedics  Surgical Clearance/ Orthopaedics   Imported By: Lanelle Bal 03/26/2010 09:21:47  _____________________________________________________________________  External Attachment:    Type:   Image     Comment:   External Document

## 2010-04-08 ENCOUNTER — Ambulatory Visit (HOSPITAL_COMMUNITY)
Admission: RE | Admit: 2010-04-08 | Discharge: 2010-04-10 | DRG: 497 | Disposition: A | Discharge status: Home Health | Payer: PRIVATE HEALTH INSURANCE | Source: Ambulatory Visit | Attending: Specialist | Admitting: Specialist

## 2010-04-08 DIAGNOSIS — X58XXXA Exposure to other specified factors, initial encounter: Secondary | ICD-10-CM | POA: Insufficient documentation

## 2010-04-08 DIAGNOSIS — M67919 Unspecified disorder of synovium and tendon, unspecified shoulder: Secondary | ICD-10-CM | POA: Insufficient documentation

## 2010-04-08 DIAGNOSIS — S43429A Sprain of unspecified rotator cuff capsule, initial encounter: Secondary | ICD-10-CM | POA: Insufficient documentation

## 2010-04-08 DIAGNOSIS — M719 Bursopathy, unspecified: Secondary | ICD-10-CM | POA: Insufficient documentation

## 2010-04-08 DIAGNOSIS — M25819 Other specified joint disorders, unspecified shoulder: Secondary | ICD-10-CM | POA: Insufficient documentation

## 2010-04-08 DIAGNOSIS — K219 Gastro-esophageal reflux disease without esophagitis: Secondary | ICD-10-CM | POA: Insufficient documentation

## 2010-04-09 LAB — URINALYSIS, ROUTINE W REFLEX MICROSCOPIC
Nitrite: NEGATIVE
Specific Gravity, Urine: 1.01 (ref 1.005–1.030)
Urine Glucose, Fasting: NEGATIVE mg/dL
pH: 6 (ref 5.0–8.0)

## 2010-04-11 LAB — URINE CULTURE

## 2010-05-07 ENCOUNTER — Telehealth: Payer: Self-pay | Admitting: Internal Medicine

## 2010-05-07 DIAGNOSIS — F411 Generalized anxiety disorder: Secondary | ICD-10-CM

## 2010-05-07 NOTE — Telephone Encounter (Signed)
alpazolam 0.5mg  tab. qty 60. take 1 tablet by mouth two times a day as needed. Last fill 2.27.12

## 2010-05-10 MED ORDER — ALPRAZOLAM 0.5 MG PO TABS: 0.5000 mg | ORAL_TABLET | Freq: Two times a day (BID) | ORAL | Status: DC | PRN

## 2010-05-10 NOTE — Telephone Encounter (Signed)
Ok to refill x 3 

## 2010-05-10 NOTE — Telephone Encounter (Signed)
Rx telephoned to [facility]

## 2010-05-13 ENCOUNTER — Ambulatory Visit: Payer: PRIVATE HEALTH INSURANCE | Attending: Specialist | Admitting: Physical Therapy

## 2010-05-13 DIAGNOSIS — M25519 Pain in unspecified shoulder: Secondary | ICD-10-CM | POA: Insufficient documentation

## 2010-05-13 DIAGNOSIS — Z96659 Presence of unspecified artificial knee joint: Secondary | ICD-10-CM | POA: Insufficient documentation

## 2010-05-13 DIAGNOSIS — M25619 Stiffness of unspecified shoulder, not elsewhere classified: Secondary | ICD-10-CM | POA: Insufficient documentation

## 2010-05-13 DIAGNOSIS — IMO0001 Reserved for inherently not codable concepts without codable children: Secondary | ICD-10-CM | POA: Insufficient documentation

## 2010-05-13 DIAGNOSIS — M6281 Muscle weakness (generalized): Secondary | ICD-10-CM | POA: Insufficient documentation

## 2010-05-18 ENCOUNTER — Ambulatory Visit: Payer: PRIVATE HEALTH INSURANCE | Attending: Specialist | Admitting: Physical Therapy

## 2010-05-18 DIAGNOSIS — M25619 Stiffness of unspecified shoulder, not elsewhere classified: Secondary | ICD-10-CM | POA: Insufficient documentation

## 2010-05-18 DIAGNOSIS — M25519 Pain in unspecified shoulder: Secondary | ICD-10-CM | POA: Insufficient documentation

## 2010-05-18 DIAGNOSIS — M6281 Muscle weakness (generalized): Secondary | ICD-10-CM | POA: Insufficient documentation

## 2010-05-18 DIAGNOSIS — Z96659 Presence of unspecified artificial knee joint: Secondary | ICD-10-CM | POA: Insufficient documentation

## 2010-05-18 DIAGNOSIS — IMO0001 Reserved for inherently not codable concepts without codable children: Secondary | ICD-10-CM | POA: Insufficient documentation

## 2010-05-19 ENCOUNTER — Ambulatory Visit: Payer: PRIVATE HEALTH INSURANCE | Admitting: Physical Therapy

## 2010-05-20 ENCOUNTER — Ambulatory Visit: Payer: PRIVATE HEALTH INSURANCE | Admitting: Physical Therapy

## 2010-05-24 ENCOUNTER — Ambulatory Visit: Payer: PRIVATE HEALTH INSURANCE | Admitting: Rehabilitation

## 2010-05-26 ENCOUNTER — Other Ambulatory Visit: Payer: Self-pay | Admitting: Obstetrics and Gynecology

## 2010-05-26 ENCOUNTER — Ambulatory Visit: Payer: PRIVATE HEALTH INSURANCE | Admitting: Rehabilitation

## 2010-05-26 DIAGNOSIS — Z139 Encounter for screening, unspecified: Secondary | ICD-10-CM

## 2010-05-28 ENCOUNTER — Ambulatory Visit: Payer: PRIVATE HEALTH INSURANCE | Admitting: Rehabilitation

## 2010-05-28 ENCOUNTER — Ambulatory Visit (HOSPITAL_BASED_OUTPATIENT_CLINIC_OR_DEPARTMENT_OTHER)
Admission: RE | Admit: 2010-05-28 | Discharge: 2010-05-28 | Disposition: A | Discharge status: Home / Self Care | Payer: PRIVATE HEALTH INSURANCE | Source: Ambulatory Visit | Attending: Obstetrics and Gynecology | Admitting: Obstetrics and Gynecology

## 2010-05-28 DIAGNOSIS — Z1231 Encounter for screening mammogram for malignant neoplasm of breast: Secondary | ICD-10-CM | POA: Insufficient documentation

## 2010-05-28 DIAGNOSIS — Z139 Encounter for screening, unspecified: Secondary | ICD-10-CM

## 2010-05-31 ENCOUNTER — Ambulatory Visit: Payer: PRIVATE HEALTH INSURANCE | Admitting: Rehabilitation

## 2010-06-02 ENCOUNTER — Ambulatory Visit: Payer: PRIVATE HEALTH INSURANCE | Admitting: Physical Therapy

## 2010-06-04 ENCOUNTER — Ambulatory Visit: Payer: PRIVATE HEALTH INSURANCE | Admitting: Rehabilitation

## 2010-06-07 ENCOUNTER — Ambulatory Visit: Payer: PRIVATE HEALTH INSURANCE | Admitting: Rehabilitation

## 2010-06-09 ENCOUNTER — Encounter: Payer: PRIVATE HEALTH INSURANCE | Admitting: Rehabilitation

## 2010-06-10 ENCOUNTER — Encounter: Payer: PRIVATE HEALTH INSURANCE | Admitting: Physical Therapy

## 2010-06-14 ENCOUNTER — Encounter: Payer: PRIVATE HEALTH INSURANCE | Admitting: Rehabilitation

## 2010-06-16 ENCOUNTER — Encounter: Payer: Self-pay | Admitting: Family

## 2010-06-16 ENCOUNTER — Ambulatory Visit: Payer: PRIVATE HEALTH INSURANCE | Attending: Specialist | Admitting: Rehabilitation

## 2010-06-16 ENCOUNTER — Ambulatory Visit (INDEPENDENT_AMBULATORY_CARE_PROVIDER_SITE_OTHER): Payer: PRIVATE HEALTH INSURANCE | Admitting: Family

## 2010-06-16 VITALS — BP 102/80 | HR 78 | Temp 98.2°F | Resp 16 | Ht 65.0 in | Wt 220.0 lb

## 2010-06-16 DIAGNOSIS — M25519 Pain in unspecified shoulder: Secondary | ICD-10-CM | POA: Insufficient documentation

## 2010-06-16 DIAGNOSIS — IMO0001 Reserved for inherently not codable concepts without codable children: Secondary | ICD-10-CM | POA: Insufficient documentation

## 2010-06-16 DIAGNOSIS — H6690 Otitis media, unspecified, unspecified ear: Secondary | ICD-10-CM

## 2010-06-16 DIAGNOSIS — M25619 Stiffness of unspecified shoulder, not elsewhere classified: Secondary | ICD-10-CM | POA: Insufficient documentation

## 2010-06-16 DIAGNOSIS — Z96659 Presence of unspecified artificial knee joint: Secondary | ICD-10-CM | POA: Insufficient documentation

## 2010-06-16 DIAGNOSIS — J029 Acute pharyngitis, unspecified: Secondary | ICD-10-CM

## 2010-06-16 DIAGNOSIS — M6281 Muscle weakness (generalized): Secondary | ICD-10-CM | POA: Insufficient documentation

## 2010-06-16 LAB — POCT RAPID STREP A (OFFICE): Rapid Strep A Screen: NEGATIVE

## 2010-06-16 MED ORDER — AMOXICILLIN-POT CLAVULANATE 500-125 MG PO TABS: 1.0000 | ORAL_TABLET | Freq: Three times a day (TID) | ORAL | Status: AC

## 2010-06-16 NOTE — Progress Notes (Signed)
  Subjective:    Patient ID: [name] [name], female    DOB: 1955-06-10, 55 y.o.   MRN: [id number]  HPI  [name] [name] is a 55 yr old female who presents today with chief complaint of right ear pain.  Symptoms started 3 days ago and are associated with right sided throat pain, and dry cough. She reports that the urologist recently started her on an prophylactic antibiotics for her history of UTI. Review of Systems    see HPI  Past Medical History  Diagnosis Date  . GERD (gastroesophageal reflux disease)   . Allergy   . Osteoporosis   . Obesity     status post bariatric procedure in [facility] 2005  . Borderline hypertension   . Elevated alkaline phosphatase level     negative workup (presumed secondary to fatty liver)  . Thyroid nodule 7/10    `  . Interstitial cystitis     Dr Normajean Baxter    History   Social History  . Marital Status: Single    Spouse Name: N/A    Number of Children: 1  . Years of Education: N/A   Occupational History  . MANAGER    Social History Main Topics  . Smoking status: Never Smoker   . Smokeless tobacco: Never Used  . Alcohol Use: No  . Drug Use: Not on file  . Sexually Active: Not on file   Other Topics Concern  . Not on file   Social History Narrative  . No narrative on file    Past Surgical History  Procedure Date  . Joint replacement 2008    left total knee  . Stomach surgery 2007    "tummy tuck"  . Gastric bypass 2005  . Eye surgery 1997    bilateral cataract extraction  . Knee surgery 1997    left knee  . Refractive surgery 2006  . Nasal sinus surgery 1989    Dr Lazarus Salines    Family History  Problem Relation Age of Onset  . Diabetes Other   . Hyperlipidemia Other   . Hypertension Other   . Cancer Other     prostate    Allergies  Allergen Reactions  . Codeine     REACTION: hives  . Cortisone     REACTION: palpitations  . 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  No allergy.    . Nitrofurantoin     REACTION: fever  . Prednisone     REACTION: palpitations    Current Outpatient Prescriptions on File Prior to Visit  Medication Sig Dispense Refill  . ALPRAZolam (XANAX) 0.5 MG tablet Take 1 tablet (0.5 mg total) by mouth 2 (two) times daily as needed.  3060 tablet  3    BP 102/80  Pulse 78  Temp(Src) 98.2 F (36.8 C) (Oral)  Resp 16  Ht 5\' 5"  (1.651 m)  Wt 220 lb (99.791 kg)  BMI 36.61 kg/m2    Objective:   Physical Exam    EXB:MWUXL, alert, and in NAD Neck:  + enlarged cervical LN on right beneath jaw. Mouth- + pharyngeal erythema without exudates or swelling Ears:  R TM with clear fluid noted behind TM- no bulging or or erythema.  L TM with purulent fluid noted behind TM. CV: s1/s2, rrr, no murmur Resp: BS CTA bilaterally without wheezes, rales, or rhonchi.    Assessment & [name]:

## 2010-06-16 NOTE — Patient Instructions (Signed)
Please call if you develop worsening pain, neck swelling, fever over 101, or if your symptoms are not improved in 48-72 hours.

## 2010-06-16 NOTE — Assessment & Plan Note (Signed)
Pt with bilateral effusions.  + R sided neck tenderness and LAD.  Rapid strep negative.  Will treat with augmentin- pt to call if symptoms worsen or if they do not improve.

## 2010-06-18 ENCOUNTER — Encounter: Payer: PRIVATE HEALTH INSURANCE | Admitting: Rehabilitation

## 2010-06-22 ENCOUNTER — Ambulatory Visit: Payer: PRIVATE HEALTH INSURANCE | Admitting: Rehabilitation

## 2010-06-29 ENCOUNTER — Ambulatory Visit: Payer: PRIVATE HEALTH INSURANCE | Admitting: Rehabilitation

## 2010-06-29 NOTE — Discharge Summary (Signed)
NAMEMALKY, RUDZINSKI NO.:  [id number]   MEDICAL RECORD NO.:  [id number]          PATIENT TYPE:  INP   LOCATION:  1604                         FACILITY:  [facility]   PHYSICIAN:  Jene Every, M.D.    DATE OF BIRTH:  August 13, 1955   DATE OF ADMISSION:  05/17/2006  DATE OF DISCHARGE:  05/21/2006                               DISCHARGE SUMMARY   ADMISSION DIAGNOSES:  1. Degenerative joint disease left knee.  2. Seasonal allergies.   DISCHARGE DIAGNOSES:  1. Degenerative joint disease left knee.  2. Seasonal allergies.  3. Status post left total knee arthroplasty.  4. Asymptomatic postoperative anemia.   CONSULTATIONS:  PT, OT, case management.   HISTORY:  This is a pleasant 55 year old female with a long standing  history of left knee pain.  She has undergone previous knee arthroscopy  in the past and did quite well for some time but unfortunately the pain  has gotten progressively worse.  She has been treated with conservative  measures including cortisol and therapy with minimal relief.  X-rays do  reveal end stage osteoarthritis of the medial joint line on the left  knee.  It is felt at this point that the patient will benefit from a  total knee arthroplasty.  Risks and benefits of this were discussed.  Medical clearance was obtained and she does elect to proceed.   DESCRIPTION OF PROCEDURE:  The patient was taken to the operating room  on May 17, 2006 to undergo a left total knee arthroplasty.  Surgeon was  Dr. Shelle Iron, assistant Roma Schanz, PA-C.  Anesthesia was spinal.  Estimated blood loss 100 mL.  Tourniquet time two hours and 10 minutes.   LABORATORY DATA:  Preoperative CBC shows a white blood cell count of  5.5, hemoglobin 12.5, hematocrit 37.3.  These were followed throughout  the hospital course.  White blood cell count remained normal.  Hemoglobin did drop to a low of 10.1, hematocrit 29.6.  However the  patient remained asymptomatic.  At the  time of discharge hemoglobin was  10.3, hematocrit 30.2.  Coagulation studies done preoperatively showed  PT of 13.9, INR 1.2, PTT of 27.  The patient was placed on Coumadin for  DVT prophylaxis at the time of discharge.  She was therapeutic on her  Coumadin with an INR of 2.6.  Routine chemistry studies were done  preoperatively as well which showed sodium 140, potassium 4.2, normal  glucose, normal renal function.  These were monitored throughout the  hospital course.  She has a slight bump in her glucose to high of 110  but all her labs remained normal.  Urinalysis was negative.  Preoperative chest x-ray showed no acute findings.  I do not see an EKG.   HOSPITAL COURSE:  The patient was taken to the operating room and  underwent the above stated procedure without difficulty.  Hemovac drain  was placed intraoperatively.  She was then transferred to PACU where she  was placed on PCA analgesics.  She was then transferred to the  orthopedic floor for continued postoperative care.  Postoperatively the  patient did fairly  well.  Vital signs remained stable.  She was  afebrile.  CPM was encouraged.  We did consult physical therapy for out  of bed.  IV was placed to hep-well.  Postop day two the patient's  Hemovac was discontinued.  Tip was intact.  Dressing was changed.  Incision was clean and dry without evidence of infection.  We did  discontinue her PCA and IV at this time.  We did initiate discharge  planning.  The patient had a low grade temperature of 99.1.  Incentive  spirometer was encouraged.  Lab values remained stable.  She continued  on Coumadin with therapeutic INR of 1.9.  the patient did fairly well  over the course of the next two days.  She progressed well with her  therapy.  Vital signs remained stable.  She was afebrile.  Labs remained  within normal.  She responded well to the Coumadin.  It was felt  postoperative day four she was ready to be discharged home with home   health, PT and OT.   DISPOSITION:  The patient discharged home with home health arrangements  made.   FOLLOW UP:  She is to follow-up with Dr. Shelle Iron in approximately 10 to 14  days for suture removal and x-ray.  She is to call the office for  appointment.   WOUND CARE:  She is to keep her incision clean and dry.  She is to  change her dressing daily.  We discussed signs and symptoms of  infection.   ACTIVITY:  She is to walk with assistance.  She may walk up stairs  utilizing a walker.  She can shower in 72 hours.  No driving for two to  four weeks.  She is to elevate her legs six times a day for 20 minutes  at a time, utilizing knee immobilizer when walking until she can  straight leg raise.   DIET:  Diet is as tolerated.   DISCHARGE MEDICATIONS:  Percocet 5/325 one to two p.o. q. four to six  p.r.n. pain, Coumadin 5 mg daily, Robaxin 500 mg one p.o. q. Six to  eight hours p.r.n. spasm.  The patient is to continue with all her home  health medications.   DISCHARGE CONDITION:  Stable.   FINAL DIAGNOSIS:  Status post left total knee arthroplasty.      Roma Schanz, P.A.      Jene Every, M.D.  Electronically Signed    CS/MEDQ  D:  06/21/2006  T:  06/21/2006  Job:  [id number]

## 2010-06-29 NOTE — Op Note (Signed)
[name], [name]            ACCOUNT NO.:  [id number]   MEDICAL RECORD NO.:  [id number]          PATIENT TYPE:  AMB   LOCATION:  [facility]                         FACILITY:  [facility]   PHYSICIAN:  Jene Every, M.D.    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   PROCEDURE PERFORMED:  1. Exam under anesthesia with manipulation under anesthesia.  2. Left knee arthroscopy, lysis of adhesions, synovectomy three      compartments.   ANESTHESIA:  General.   SURGEON:  Jene Every, M.D.   ASSISTANT:  None.   BRIEF HISTORY AND INDICATIONS:  This is a pleasant young female who is  status post a total knee arthroplasty and has maximized her flexion and  physical therapy, has been unable to obtain beyond 90 degrees of flexion  due to a presumed arthrofibrosis.  The radiographs demonstrated  excellent placement of the prosthesis.  She apparently has presumed  quadriceps adhesions.  She is indicated for manipulation under  anesthesia and arthroscopy to augment the lysis of adhesions so that we  could get her motion to 120 to 130 degrees so it would facilitate stair  climbing.  The risks and benefits were discussed including bleeding,  infection, DVT, PE, anesthetic complications, fracture, no change in her  symptoms, need for revision of the total knee replacement, etc.   DESCRIPTION OF PROCEDURE:  The patient was placed in a supine position.  After induction of adequate anesthesia and 2 grams Kefzol, the left  lower extremity was prepped and draped in the usual sterile fashion.  I  gently flexed the knee to 90 degrees.  It did not appear, with gentle  flexion pressure, that  any improved motion was obtainable.  Therefore, I  proceeded with the arthroscopic lysis of adhesions.   The superior medial and superior lateral parapatellar portal was  fashioned with a #11 blade as was the lateral parapatellar portal after  infiltration with 0.5% Marcaine.  An inferior medial parapatellar portal  was performed, as well.  I introduced the arthroscopic cannula laterally  and gently advanced it into the suprapatellar pouch beneath the patella.  I appreciated some significant adhesions in the suprapatellar pouch.  With gentle swiping of the cannula from that position as well as the  superior medial and superior lateral portal, we were able to mobilize  the patella, lyse adhesions from the suprapatellar pouch and medial and  lateral compartment.  Following that, we inserted the camera in the  superior lateral and ingress cannula irrigating and insufflating the  joint.  We then arthroscopically shaved adhesions and synovitis from the  suprapatellar pouch and the medial and lateral compartment. There was  some encroachment of the medial compartment between the components of  what appeared to be some scar tissue.  This was shaved.  The components  appeared to be intact.  There was no pathology of the polyethylene  insert noted.  After performing the lysis of adhesions, synovectomy, and  excision of scar tissue, I removed the arthroscopic cannula after  draining the knee.  I was able to then flex the knee to close 130  degrees following this and I felt there was significant improvement in  range of motion following this lysis of adhesions and it appeared the  adhesions of the suprapatellar quadricep was the primary culprit here.  Following this, I closed all portals with 4-0 nylon simple sutures.  0.25% Marcaine with epinephrine was infiltrated and the joint wound was  dressed sterilely.  Just prior to that and prior to closing, I placed a  cannula beneath the patellar ligament  and lifted the tibia and leg with  that confirming that the patellar ligament and quadriceps mechanism was  not ruptured.  After a sterile dressing was applied, the patient was  awakened without difficulty and transported to the recovery room in  satisfactory condition.  The patient tolerated the procedure well.  There were no complications.      Jene Every, M.D.  Electronically Signed     JB/MEDQ  D:  08/30/2006  T:  08/30/2006  Job:  [id number]

## 2010-06-29 NOTE — Assessment & Plan Note (Signed)
[facility]                             PULMONARY OFFICE NOTE   NAME:[name] [name], Servantes                   MRN:          [id number]  DATE:06/30/2006                            DOB:          1955/05/08    HISTORY OF PRESENT ILLNESS:  Patient is a 55 year old white female  patient of Dr. Kriste Basque who has a known history of allergic rhinitis, mild  hypertension, and osteoarthritis, presents today complaining of a 1-day  history of nasal congestion, postnasal drip, sore throat, and ear pain.  Patient denies any fever, purulent sputum, chest pain, rash, or recent  travel.   PAST MEDICAL HISTORY:  Reviewed.   CURRENT MEDICATIONS:  Reviewed.   PHYSICAL EXAMINATION:  Patient is a pleasant female, in no acute  distress.  She is afebrile with stable vital signs.  Her O2 saturation is 96% on  room air.  HEENT:  Posterior pharynx with some mild erythema.  Non-tender sinuses.  TMs normal.  NECK:  Supple without adenopathy.  No JVD.  LUNG SOUNDS:  Are clear to auscultation bilaterally.  CARDIAC:  Regular rate and rhythm.  ABDOMEN:  Soft and non-tender.  EXTREMITIES:  Warm without any edema.   DATA:  Strep test is negative.   IMPRESSION AND PLAN:  Acute pharyngitis and rhinitis.  Patient is to use  Tylenol as needed.  May continue on Zyrtec and Nasonex as scheduled.  May add in some saline nasal spray, and is to use some salt water  gargles as needed for sore throat.  Patient will follow up as scheduled,  or sooner if needed.      Rubye Oaks, NP       Lonzo Cloud. Kriste Basque, MD    TP/MedQ  DD: 06/30/2006  DT: 06/30/2006  Job #: [id number]

## 2010-07-02 NOTE — Op Note (Signed)
NAMECLAY, MENSER NO.:  [id number]   MEDICAL RECORD NO.:  [id number]          PATIENT TYPE:  INP   LOCATION:  0003                         FACILITY:  [facility]   PHYSICIAN:  Jene Every, M.D.    DATE OF BIRTH:  1956/02/02   DATE OF PROCEDURE:  05/17/2006  DATE OF DISCHARGE:                               OPERATIVE REPORT   PREOPERATIVE DIAGNOSIS:  Degenerative joint disease left knee.   POSTOPERATIVE DIAGNOSIS:  Degenerative joint disease left knee.   PROCEDURE PERFORMED:  Left total knee arthroplasty.   ANESTHESIA:  Spinal.   ASSISTANT:  Roma Schanz, P.A.   COMPONENTS:  DePuy rotating platform, 3 femur and 3 tibia, 15 mm  inserted, small patella 32 mm.   BRIEF HISTORY AND INDICATION:  The patient is a 55 year old with end-  stage osteoarthrosis of the left knee, particularly the medial  compartment.  She has been refractory to conservative.  Operative  intervention is indicated for replacement of the degenerated joint.  Risks and benefits discussed including bleeding, infection, damage to  vascular structures, suboptimal range of motion, arthrofibrosis,  persistent pain, need for revision, DVT, PE, anesthetic complications,  etc.   The patient in supine position; after induction of spinal anesthesia 2  grams Kefzol, left lower extremity was prepped, draped and exsanguinated  in the usual sterile fashion.  Thigh tourniquet inflated to 300 mmHg.  A  midline incision was made over the anterior patella, full-thickness flap  was developed.  Median parapatellar arthrotomy was performed, patella  was everted, patellofemoral ligament divided.  Knee was flexed.  Osteophytes removed with a rongeur off the femur and the patella.  Removed the residual of the ACL and the visual menisci medially and  laterally.  I subperiosteally elevated the medial retinaculum and the  superficial medial collateral ligament.  This freed up the medial  compartment as there  was some slight tightness here.  Next I measured  the patella 15 mm in width, measured at a small 32 patella, which would  take 8 in thickness off of the drilling device.  We then used a step  drill the enter the femoral canal that was irrigated.  We used a 5  degree left 10 mm off the distal femur.  This was fixed.  Soft tissue  protected.  Oscillating saw perform the distal cut.  The tight tibial  talar joint so we then proceeded to the tibia, subluxed it with McHale's  to protect the soft tissue at all times.  Paid particular attention not  to avulse subpatellar ligament.  The remnant of the medial and lateral  menisci were then removed.  The PCL was recessed.  We then used the  external alignment jig parallel to the tibia, anterior medial to the  tibial tubercle, pinned it 10 mm off the lateral tibia.  We checked  medially and laterally and felt laterally, which was slightly elevated,  was a better cut than medially.  This was then fixed in the appropriate  manner and the oscillating saw utilized to perform the tibial cut, soft  tissues were protected, then placed a spacer  guide in there and they  were equal in flexion and extension.  She was then flexed back, the  femur exposed, sized to a 3 off the anterior cortex.  The distal femoral  cutting jig was then applied and the anterior posterior chamfer cuts  were then performed with the oscillating saw followed by the box cut  protecting the posterior elements and the soft tissues at all times.  We  trialed the femur and it was satisfactory.  Tibia was subluxed, we  placed a baseplate 3 onto that for maximal coverage just medial to the  tibial tubercle and checked the external alignment and was satisfactory.  We then used a punch guide and center drill.  Following that, this was  then removed and the trial tibia, femur with a trial insert was then  used, a 12 was used.  We had slight hyperextension, slight instability,  felt therefore  that a 15 would probably be optimal.  Felt, however, the  cuts were adequate.  We then placed the knee in the extension, everted  the patella, used a patellar clamp and reamed 8 mm in depth, used the  baseplate, reamed for the pegs, trial patellar button was placed, the  trial femur, tibia and 15 insert were replaced.  She had full extension,  no instability of varus/valgus stress in 0-30 degrees or full extension.  No subluxation with the patella reduced and clamped.  Normal  patellofemoral tracking.  All trials were then removed.  We checked  posteriorly, there was no residual meniscus noted or intervening  structures.  The fresh drape was placed after we irrigated and cleaned  the wound with pulsatile lavage.  The knee was then flexed, dried,  evacuated.  Cement mixed in the appropriate fashion and applied first to  the tibia with a 3 mm prosthetic component, impacted onto the tibia, a  15 trial insert was placed to that.  The 3 femur was then impacted onto  the distal femur with cement on the runners and cement applied to the  distal femur.  Redundant cement removed.  Good coverage was noted both  on the tibia and the femur.  The knee was then reduced.  Redundant  cement removed.  Axial load applied to the leg.  We then clamped the  patellar button 38 after cementing the patella.  After appropriate  curing of the cement, the clamp was removed, redundant cement removed,  normal patellofemoral tracking, full flexion and extension without  liftoff or instability noted.  We therefore selected the 15, removed the  trial.  Prior to placing the permanent insert, we checked posteriorly to  remove redundant cement and any loose debris.  The wound was then  copiously irrigated.  We placed the 15 rotating platform insert.  After  the tibia was subluxed reduced it, full extension, full flexion without  liftoff, good stability varus valgus stressing, good patellofemoral tracking.  Next, wound was  copiously irrigated.  A drain was placed and  brought out through a lateral stab wound in the skin.  Patellar tendon  was repaired with #1 Vicryl figure-of-eight sutures.  Subcutaneous  tissue reapproximated 2-0 Vicryl simple suture.  The skin was  reapproximated with staples.  We closed in a slightly flexed position.  Following closure, good flexion/extension again.  Wound was dressed  sterilely.  Tourniquet was deflated after 0.25% Marcaine with  epinephrine was placed in the joint.  There was adequate vascularization  of the lower extremity appreciated.  The  patient tolerated the procedure  well.  No complications.  Tourniquet time was 1 hour 15 minutes.      Jene Every, M.D.  Electronically Signed     JB/MEDQ  D:  05/17/2006  T:  05/17/2006  Job:  [id number]

## 2010-07-02 NOTE — H&P (Signed)
[name] [name], [name] [name] NO.:  [id number]   MEDICAL RECORD NO.:  [id number]         PATIENT TYPE:  LINP   LOCATION:                               FACILITY:  [facility]   PHYSICIAN:  Jene Every, M.D.    DATE OF BIRTH:  March 06, 1955   DATE OF ADMISSION:  05/17/2006  DATE OF DISCHARGE:                              HISTORY & PHYSICAL   HISTORY AND PHYSICAL   CHIEF COMPLAINT:  Left knee pain.   HISTORY:  [name] [name] is a pleasant 55 year old female who has had a  longstanding history of left knee pain.  She has had a previous knee  arthroscopy in the past and done quite well with that, but the pain has  gotten progressively worse over the past several months.  She received a  cortisone injection with minimal relief.  X-rays do reveal end-stage  osteoarthrosis of the left knee.  It is felt at this point the patient  will benefit from a total knee arthroplasty.  Medical clearance was  obtained through Dr. Kriste Basque and she does elect proceed.   PAST MEDICAL HISTORY:  Seasonal allergies.   CURRENT MEDICATIONS:  Zyrtec D.  Prilosec.  Multivitamins.   ALLERGIES:  TO CODEINE WHICH CAUSES HIVES, BUT SHE DOES TOLERATE VICODIN  AND PERCOCET OKAY.  PREDNISONE CAUSES HEART PALPITATIONS.   PAST SURGICAL HISTORY:  Includes left knee arthroscopy in 1997.  Mini  gastric bypass surgery in 2005.  Tummy tuck and removal of excess skin  in 2007.   SOCIAL HISTORY:  The patient is single.  She denies any tobacco  consumption.  Drinks occasional alcohol.   PRIMARY CARE PHYSICIAN:  Dr. Kriste Basque.   FAMILY HISTORY:  Significant for coronary artery disease, hypertension  and diabetes.   REVIEW OF SYSTEMS:  General:  The patient denies any fever, chills,  night sweats or bleeds.  HEENT:  No blurry or double vision, seizure,  headache or paralysis.  Respiratory:  No shortness of breath, productive  cough or hemoptysis.  Cardiovascular:  No chest pain or history of  orthopnea.  GU:  No  dysuria, hematuria or discharge.  GI:  No nausea,  vomiting, diarrhea or constipation, melena or bloody stools.  Musculoskeletal:  As per HPI.  Patient did have a recent sinusitis  treated with Augmentin.  She just completed her final dose yesterday.   PHYSICAL EXAMINATION:  Pulse 76, respiratory 16, BP 110/86.  General:  This is a well-developed, well-nourished female seen now in no acute  distress.  She does walk with an antalgic gait.  HEENT is atraumatic,  normocephalic.  Pupils equal, round and reactive to light.  EOMs intact.  Neck supple.  No lymphadenopathy.  Chest clear to auscultation  bilaterally.  No rhonchi, wheezes or rales.  Breasts/GU:  Not examined  or pertinent to HPI.  Heart:  Regular rate and rhythm without murmurs,  gallops or rubs.  Abdomen is soft, nontender, nondistended.  Bowel  sounds x4.  Skin:  No rashes or lesions are noted.  Extremities:  The  patient does have some fatty deposits around the knee.  She is  exquisitely tender along the medial joint line.  There is mild effusion  noted.  Range of motion is 0-140.  Calves soft, nontender.  There is  mild pain with ___joint_______ compression.   IMPRESSION:  Degenerative joint disease left knee.   [name]:  The patient will be admitted to [facility] to undergo a  left total knee arthroplasty.      Roma Schanz, P.A.      Jene Every, M.D.  Electronically Signed    CS/MEDQ  D:  05/15/2006  T:  05/15/2006  Job:  [id number]

## 2010-07-02 NOTE — Assessment & Plan Note (Signed)
[facility]                            CARDIOLOGY OFFICE NOTE   [name], [name]                   MRN:          [id number]  DATE:03/16/2006                            DOB:          1955-11-18    REFERRING PHYSICIAN:  Lonzo Cloud. Kriste Basque, MD   REASON FOR VISIT:  Preoperative evaluation.   HISTORY OF PRESENT ILLNESS:  [name] is a 55 year old woman with a  history of recurrent sinusitis and rhinitis, as well as previously noted  venous insufficiency.  She denies any active problems with hypertension,  type 2 diabetes mellitus, or cardiovascular disease.  She has a history  of morbid obesity and underwent a mini gastric bypass operation in 2005.  She states that she has had increasing pain in her left knee and was  recently seen by Dr. Shelle Iron, apparently with findings of fairly severe  arthritic disease.  At this point, a total left knee replacement is  anticipated for early March.  An electrocardiogram was obtained  preoperatively with findings of a right bundle branch block pattern,  which was new in comparison with old tracings, and she is referred now  for further evaluation.   In reviewing her old tracings in the chart, I see an electrocardiogram  from January 25, which shows a right bundle branch block pattern, which  was new in comparison to a prior tracing from August of 2006.  Right  bundle branch block is also noted on the present tracing from today,  although otherwise the tracing is nonspecific.  Symptomatically, [name] is limited by her knee pain at this time, although a few  months ago was able to exercise on an elliptical machine up to 60  minutes at a time without any limiting dyspnea or chest pain.  She  denies having any typical exertional chest pain or dyspnea at any other  time as well.  She has had no prior cardiac risk stratification.  We  have been asked to assess her further.   ALLERGIES:  CODEINE and  PREDNISONE.   PRESENT MEDICATIONS:  1. Zyrtec 10 mg p.o. daily.  2. Prilosec OTC p.o. daily.  3. Valtrex daily.  4. 5-HTP q.i.d.  5. Kelp 1320 mg 2 tablets daily.  6. Omega 3 supplements.  7. Vitamin D supplements.  8. Multivitamins.  9. Tums.  10.Vitamin B6.  11.P.r.n. Xanax.  12.P.r.n. temazepam.  13.P.r.n. hydrocodone.  14.P.r.n. amitriptyline.   PAST MEDICAL HISTORY:  Is as outlined above.  I also see a prior history  of tummy tuck in 2007, arthroscopic knee surgery in 1997, and nose  surgery in 1990.   REVIEW OF SYSTEMS:  As described in the history of present illness.  She  has trouble with anxiety at times.  She has also had increased  psychosocial stressors.  She tells me that she has a 81 year old  daughter who lives at home with bipolar disorder.  She has chronic  problems with seasonal allergies.   FAMILY HISTORY:  Reviewed and noncontributory for premature  cardiovascular disease.   SOCIAL HISTORY:  The patient is single.  She has  1 child.  She denies  any tobacco or alcohol use.  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   EXAMINATION:  Blood pressure today is 130/71, heart rate is 80, weight  is 203 pounds.  The patient is comfortable and in no acute distress.  HEENT:  Conjunctivae looks normal.  Oropharynx clear.  NECK:  Supple without elevated jugular venous pressure.  No loud bruits.  No obvious thyromegaly.  LUNGS:  Clear without labored breathing.  CARDIAC:  Regular rate and rhythm without loud murmur or S3 gallop.  No  pericardial rub.  ABDOMEN:  Nondistended.  Bowel sounds present.  EXTREMITIES:  Exhibit trace edema.  Some venous stasis.  Distal pulses  are 1 to 2+.  SKIN:  No ulcerative changes noted.  MUSCULOSKELETAL:  No kyphosis is noted.  NEURO/PSYCH:  The patient is alert and oriented x3.   RECENT LABORATORY DATA:  Cholesterol 141, LDL 83, HDL 46, potassium 4.0,  BUN 17, creatinine 0.7, hemoglobin 13.8, TSH 2.65.    IMPRESSION/RECOMMENDATIONS:  1. Right bundle branch block pattern noted on electrocardiogram, new      compared to prior tracings, although without any obvious exertional      chest pain or dyspnea.  A left total knee replacement is      anticipated over the next month and [name] has not had any      prior cardiac risk stratification.  [name] will be to proceed      with an adenosine Myoview for evaluation of potential ischemia, and      also a resting 2D echocardiogram to assess cardiac structure and      function.  If these studies are low-risk, I would anticipate she      should be able to proceed as planned without any other specific      evaluation.  Otherwise, we can see her      back and discuss the situation further.  2. Further [name] is to follow.     Jonelle Sidle, MD  Electronically Signed    SGM/MedQ  DD: 03/16/2006  DT: 03/16/2006  Job #: [id number]   cc:   Lonzo Cloud. Kriste Basque, MD  Jene Every, M.D.

## 2010-07-06 ENCOUNTER — Ambulatory Visit: Payer: PRIVATE HEALTH INSURANCE | Admitting: Rehabilitation

## 2010-07-07 ENCOUNTER — Telehealth: Payer: Self-pay | Admitting: Internal Medicine

## 2010-07-07 MED ORDER — FUROSEMIDE 20 MG PO TABS: 20.0000 mg | ORAL_TABLET | Freq: Every day | ORAL | Status: DC

## 2010-07-07 NOTE — Telephone Encounter (Signed)
Refill- furosemide 20mg  tab. One by mouth once daily as directed. Qty 30. Last fill 4.23.12  Pharmacy comments: cycle fill medication. Authorization is required for next refill. No refills available.

## 2010-07-07 NOTE — Telephone Encounter (Signed)
Rx refill sent to pharmacy. 

## 2010-07-13 ENCOUNTER — Ambulatory Visit: Payer: PRIVATE HEALTH INSURANCE | Admitting: Physical Therapy

## 2010-07-16 ENCOUNTER — Telehealth: Payer: Self-pay

## 2010-07-16 DIAGNOSIS — R945 Abnormal results of liver function studies: Secondary | ICD-10-CM

## 2010-07-16 NOTE — Telephone Encounter (Signed)
Pt needs repeat labs Left message on machine to call back

## 2010-07-16 NOTE — Telephone Encounter (Signed)
Message copied by Donata Duff on Fri Jul 16, 2010  8:23 AM ------      Message from: Donata Duff      Created: Tue Apr 20, 2010  1:52 PM       Pt needs repeat lfts

## 2010-07-19 NOTE — Telephone Encounter (Signed)
Pt reminded to have labs  

## 2010-07-20 ENCOUNTER — Ambulatory Visit: Payer: PRIVATE HEALTH INSURANCE | Attending: Specialist | Admitting: Rehabilitation

## 2010-07-20 DIAGNOSIS — M6281 Muscle weakness (generalized): Secondary | ICD-10-CM | POA: Insufficient documentation

## 2010-07-20 DIAGNOSIS — M25619 Stiffness of unspecified shoulder, not elsewhere classified: Secondary | ICD-10-CM | POA: Insufficient documentation

## 2010-07-20 DIAGNOSIS — M25519 Pain in unspecified shoulder: Secondary | ICD-10-CM | POA: Insufficient documentation

## 2010-07-20 DIAGNOSIS — Z96659 Presence of unspecified artificial knee joint: Secondary | ICD-10-CM | POA: Insufficient documentation

## 2010-07-20 DIAGNOSIS — IMO0001 Reserved for inherently not codable concepts without codable children: Secondary | ICD-10-CM | POA: Insufficient documentation

## 2010-07-26 ENCOUNTER — Ambulatory Visit: Payer: PRIVATE HEALTH INSURANCE | Admitting: Rehabilitation

## 2010-07-27 LAB — HM PAP SMEAR: HM Pap smear: NORMAL

## 2010-07-29 ENCOUNTER — Encounter: Payer: PRIVATE HEALTH INSURANCE | Admitting: Rehabilitation

## 2010-07-30 ENCOUNTER — Other Ambulatory Visit (INDEPENDENT_AMBULATORY_CARE_PROVIDER_SITE_OTHER): Payer: PRIVATE HEALTH INSURANCE

## 2010-07-30 DIAGNOSIS — R7989 Other specified abnormal findings of blood chemistry: Secondary | ICD-10-CM

## 2010-07-30 DIAGNOSIS — R945 Abnormal results of liver function studies: Secondary | ICD-10-CM

## 2010-07-30 LAB — HEPATIC FUNCTION PANEL
Albumin: 3.8 g/dL (ref 3.5–5.2)
Alkaline Phosphatase: 113 U/L (ref 39–117)
Bilirubin, Direct: 0 mg/dL (ref 0.0–0.3)

## 2010-08-02 ENCOUNTER — Encounter (INDEPENDENT_AMBULATORY_CARE_PROVIDER_SITE_OTHER): Payer: PRIVATE HEALTH INSURANCE | Admitting: Obstetrics and Gynecology

## 2010-08-02 ENCOUNTER — Ambulatory Visit: Payer: PRIVATE HEALTH INSURANCE | Admitting: Rehabilitation

## 2010-08-02 DIAGNOSIS — Z01419 Encounter for gynecological examination (general) (routine) without abnormal findings: Secondary | ICD-10-CM

## 2010-08-03 ENCOUNTER — Telehealth: Payer: Self-pay | Admitting: *Deleted

## 2010-08-03 DIAGNOSIS — R748 Abnormal levels of other serum enzymes: Secondary | ICD-10-CM

## 2010-08-03 NOTE — Telephone Encounter (Signed)
Spoke with patient to advise her that labs were normal. We will repeat labs in 1 year (already entered orders into EP[facility] for 08/03/2011).

## 2010-08-03 NOTE — Telephone Encounter (Signed)
Message copied by Richardson Chiquito on Tue Aug 03, 2010 11:10 AM ------      Message from: Rachael Fee      Created: Tue Aug 03, 2010  8:05 AM                   Please call the patient.  The labs were all normal.  She needs repeat LFTs in one year again.

## 2010-08-03 NOTE — Progress Notes (Signed)
See phone note dated 08/03/10 

## 2010-08-04 ENCOUNTER — Encounter: Payer: PRIVATE HEALTH INSURANCE | Admitting: Rehabilitation

## 2010-08-09 ENCOUNTER — Encounter: Payer: PRIVATE HEALTH INSURANCE | Admitting: Rehabilitation

## 2010-08-10 ENCOUNTER — Ambulatory Visit: Payer: PRIVATE HEALTH INSURANCE | Admitting: Rehabilitation

## 2010-08-11 ENCOUNTER — Telehealth: Payer: Self-pay | Admitting: Internal Medicine

## 2010-08-11 MED ORDER — SERTRALINE HCL 25 MG PO TABS: 25.0000 mg | ORAL_TABLET | Freq: Every day | ORAL | Status: DC

## 2010-08-11 NOTE — Telephone Encounter (Signed)
Rx refill sent to pharmacy. Patient is due for follow up

## 2010-08-11 NOTE — Telephone Encounter (Signed)
Refill- sertraline hcl 25mg  tab. One by mouth once daily. Qty 30. Last fill 5.29.12  Pharmacist comment: cycle fill medication. Authorization is required for next refill. No refills available.

## 2010-09-08 ENCOUNTER — Encounter: Payer: PRIVATE HEALTH INSURANCE | Admitting: Internal Medicine

## 2010-09-08 ENCOUNTER — Encounter: Payer: Self-pay | Admitting: Internal Medicine

## 2010-09-08 ENCOUNTER — Other Ambulatory Visit: Payer: Self-pay | Admitting: Internal Medicine

## 2010-09-08 ENCOUNTER — Ambulatory Visit (INDEPENDENT_AMBULATORY_CARE_PROVIDER_SITE_OTHER): Payer: PRIVATE HEALTH INSURANCE | Admitting: Internal Medicine

## 2010-09-08 VITALS — BP 104/72 | HR 88 | Temp 98.4°F | Resp 18 | Ht 65.0 in | Wt 227.0 lb

## 2010-09-08 DIAGNOSIS — Z0000 Encounter for general adult medical examination without abnormal findings: Secondary | ICD-10-CM

## 2010-09-08 DIAGNOSIS — N390 Urinary tract infection, site not specified: Secondary | ICD-10-CM | POA: Insufficient documentation

## 2010-09-08 LAB — BASIC METABOLIC PANEL
CO2: 24 mEq/L (ref 19–32)
Chloride: 108 mEq/L (ref 96–112)
Creat: 0.54 mg/dL (ref 0.50–1.10)
Potassium: 4.1 mEq/L (ref 3.5–5.3)

## 2010-09-08 LAB — POCT URINALYSIS DIPSTICK
Bilirubin, UA: NEGATIVE
Blood, UA: NEGATIVE
Ketones, UA: NEGATIVE
Protein, UA: NEGATIVE
Spec Grav, UA: 1.02
pH, UA: 6.5

## 2010-09-08 LAB — CBC WITH DIFFERENTIAL/PLATELET
Eosinophils Relative: 3 % (ref 0–5)
HCT: 37 % (ref 36.0–46.0)
Lymphocytes Relative: 32 % (ref 12–46)
Lymphs Abs: 2.2 10*3/uL (ref 0.7–4.0)
MCV: 82.6 fL (ref 78.0–100.0)
Neutro Abs: 4.2 10*3/uL (ref 1.7–7.7)
Platelets: 349 10*3/uL (ref 150–400)
RBC: 4.48 MIL/uL (ref 3.87–5.11)
WBC: 7.1 10*3/uL (ref 4.0–10.5)

## 2010-09-08 LAB — LIPID PANEL
Cholesterol: 169 mg/dL (ref 0–200)
HDL: 59 mg/dL (ref >39)
LDL Cholesterol: 92 mg/dL (ref 0–99)
Total CHOL/HDL Ratio: 2.9 Ratio
Triglycerides: 91 mg/dL (ref <150)
VLDL: 18 mg/dL (ref 0–40)

## 2010-09-08 MED ORDER — SULFAMETHOXAZOLE-TRIMETHOPRIM 800-160 MG PO TABS: 1.0000 | ORAL_TABLET | Freq: Two times a day (BID) | ORAL | Status: AC

## 2010-09-08 NOTE — Telephone Encounter (Signed)
Ok rf2

## 2010-09-08 NOTE — Assessment & Plan Note (Signed)
Symptoms suggestive of urinary tract infection. Urinalysis unremarkable. Culture pending. Begin antibiotic therapy. Patient plans to contact urology to resume prophylactic antibiotics as well

## 2010-09-08 NOTE — Telephone Encounter (Signed)
Rx refill called to pharmacy.

## 2010-09-08 NOTE — Assessment & Plan Note (Signed)
Normal exam. Obtain CBC, Chem-7, TSH, lipid, vitamin D and B12. Pap smears, mammograms and bone densities per gynecology. Colonoscopy up-to-date.

## 2010-09-08 NOTE — Progress Notes (Signed)
  Subjective:    Patient ID: [name] [name], female    DOB: [birth year], 55 y.o.   MRN: [id number]  HPI patient presents to clinic for complete physical exam. Sees gynecology for Pap smears mammograms as well as bone densities. Has history recurrent urinary tract infection per patient and sees urology. Was on daily prophylactic antibiotic however ran out. Notes several day history of dysuria and suprapubic pressure. Denies fever chills nausea or vomiting. Urinalysis reviewed unremarkable. No other complaints.  Reviewed past medical history, medications and allergies    Review of Systems see history of present illness     Objective:   Physical Exam  Nursing note and vitals reviewed. Constitutional: She appears well-developed. No distress.  HENT:  Head: Normocephalic and atraumatic.  Right Ear: Tympanic membrane, external ear and ear canal normal.  Left Ear: Tympanic membrane, external ear and ear canal normal.  Nose: Nose normal.  Mouth/Throat: Oropharynx is clear and moist. No oropharyngeal exudate.  Eyes: Conjunctivae and EOM are normal. Pupils are equal, round, and reactive to light. Right eye exhibits no discharge. Left eye exhibits no discharge. No scleral icterus.  Neck: Neck supple. No JVD present. Carotid bruit is not present. No thyromegaly present.  Cardiovascular: Normal rate, regular rhythm, normal heart sounds and intact distal pulses.  Exam reveals no gallop and no friction rub.   No murmur heard. Pulmonary/Chest: Effort normal and breath sounds normal. No respiratory distress. She has no wheezes. She has no rales.  Abdominal: Soft. Bowel sounds are normal. She exhibits no distension and no mass. There is no hepatosplenomegaly. There is tenderness in the suprapubic area. There is no rebound and no guarding.  Lymphadenopathy:    She has no cervical adenopathy.  Neurological: She is alert.  Skin: Skin is warm and dry. No rash noted. She is not diaphoretic. No erythema.    Psychiatric: She has a normal mood and affect.          Assessment & Plan:

## 2010-09-13 ENCOUNTER — Other Ambulatory Visit: Payer: Self-pay | Admitting: Internal Medicine

## 2010-09-13 DIAGNOSIS — E559 Vitamin D deficiency, unspecified: Secondary | ICD-10-CM

## 2010-10-15 ENCOUNTER — Ambulatory Visit (INDEPENDENT_AMBULATORY_CARE_PROVIDER_SITE_OTHER): Payer: PRIVATE HEALTH INSURANCE | Admitting: Internal Medicine

## 2010-10-15 ENCOUNTER — Encounter: Payer: Self-pay | Admitting: Internal Medicine

## 2010-10-15 DIAGNOSIS — M7989 Other specified soft tissue disorders: Secondary | ICD-10-CM

## 2010-10-15 DIAGNOSIS — R252 Cramp and spasm: Secondary | ICD-10-CM

## 2010-10-15 MED ORDER — FUROSEMIDE 20 MG PO TABS: ORAL_TABLET | ORAL | Status: DC

## 2010-10-24 DIAGNOSIS — R252 Cramp and spasm: Secondary | ICD-10-CM | POA: Insufficient documentation

## 2010-10-24 DIAGNOSIS — M7989 Other specified soft tissue disorders: Secondary | ICD-10-CM | POA: Insufficient documentation

## 2010-10-24 NOTE — Assessment & Plan Note (Signed)
Increase lasix 20mg  1-2 po qam prn swelling. Followup if no improvement or worsening.

## 2010-10-24 NOTE — Progress Notes (Signed)
  Subjective:    Patient ID: [name] [name], female    DOB: 11-11-1955, 55 y.o.   MRN: [id number]  HPI Pt presents to clinic for evaluation of foot swelling. Notes chronic intermittent h/o foot swelling and cramps. No calf pain or swelling. No thromboembolic risks. No injury or trauma. Takes low dose lasix without side effects. Reviewed low vit d with increase of vit d dose since last visit. No other complaints.  Past Medical History  Diagnosis Date  . GERD (gastroesophageal reflux disease)   . Allergy   . Osteoporosis   . Obesity     status post bariatric procedure in [facility] 2005  . Borderline hypertension   . Elevated alkaline phosphatase level     negative workup (presumed secondary to fatty liver)  . Thyroid nodule 7/10    `  . Interstitial cystitis     Dr Normajean Baxter  . Osteoporosis   . 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 Joint replacement 2008    left total knee  . Stomach surgery 2007    "tummy tuck"  . Gastric bypass 2005  . Eye surgery 1997    bilateral cataract extraction  . Knee surgery 1997    left knee  . Refractive surgery 2006  . Nasal sinus surgery 1989    Dr Lazarus Salines  . Cryotherapy 1980  . Colposcopy 1980    reports that she has never smoked. She has never used smokeless tobacco. She reports that she does not drink alcohol. Her drug history not on file. family history includes Cancer in her other; Diabetes in her father and other; Heart disease in her father, maternal grandfather, and maternal grandmother; Hyperlipidemia in her other; and Hypertension in her father, maternal grandfather, maternal grandmother, mother, and other. Allergies  Allergen Reactions  . Codeine     REACTION: hives  . Cortisone     REACTION: palpitations  . 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  No allergy.  . Nitrofurantoin     REACTION: fever  . Prednisone     REACTION: palpitations       Review  of Systems see hpi     Objective:   Physical Exam  Nursing note and vitals reviewed. Constitutional: She appears well-developed and well-nourished. No distress.  HENT:  Head: Normocephalic and atraumatic.  Right Ear: External ear normal.  Left Ear: External ear normal.  Eyes: Conjunctivae are normal. No scleral icterus.  Neurological: She is alert.  Skin: Skin is warm and dry. No rash noted. She is not diaphoretic. No erythema. No pallor.       No wound or ulceration. Mild st swelling of feet. No pitting edema. No calf swelling or palpable cords. Gait nl.   Psychiatric: She has a normal mood and affect.          Assessment & Plan:

## 2010-10-24 NOTE — Assessment & Plan Note (Signed)
Begin mag ox po qd.

## 2010-10-28 ENCOUNTER — Encounter: Payer: Self-pay | Admitting: Internal Medicine

## 2010-10-28 ENCOUNTER — Ambulatory Visit (INDEPENDENT_AMBULATORY_CARE_PROVIDER_SITE_OTHER): Payer: PRIVATE HEALTH INSURANCE | Admitting: Internal Medicine

## 2010-10-28 ENCOUNTER — Ambulatory Visit: Payer: PRIVATE HEALTH INSURANCE | Admitting: Internal Medicine

## 2010-10-28 DIAGNOSIS — J329 Chronic sinusitis, unspecified: Secondary | ICD-10-CM

## 2010-10-28 DIAGNOSIS — L719 Rosacea, unspecified: Secondary | ICD-10-CM

## 2010-10-28 MED ORDER — SULFACETAMIDE SODIUM (ACNE) 10 % EX LOTN: TOPICAL_LOTION | CUTANEOUS | Status: AC

## 2010-10-28 MED ORDER — AMOXICILLIN-POT CLAVULANATE 875-125 MG PO TABS: 1.0000 | ORAL_TABLET | Freq: Two times a day (BID) | ORAL | Status: DC

## 2010-10-28 NOTE — Progress Notes (Signed)
  Subjective:    Patient ID: [name] [name], female    DOB: [birth year], 55 y.o.   MRN: [id number]  HPI Pt presents to clinic for evaluation of possible sinus infxn. Notes over 1wk h/o nasal drainge, sinus pressure and pain. +fatigue and ha but no fever or sick exposure.  Tylenol helped ha. H/o rosacea with erythema of cheeks and nose. Cannot tolerate metrogel. Has had recent flare. Requests rf of klaron lotion. No other complaints.   Past Medical History  Diagnosis Date  . GERD (gastroesophageal reflux disease)   . Allergy   . Osteoporosis   . Obesity     status post bariatric procedure in [facility] 2005  . Borderline hypertension   . Elevated alkaline phosphatase level     negative workup (presumed secondary to fatty liver)  . Thyroid nodule 7/10    `  . Interstitial cystitis     Dr Normajean Baxter  . Osteoporosis   . 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 Joint replacement 2008    left total knee  . Stomach surgery 2007    "tummy tuck"  . Gastric bypass 2005  . Eye surgery 1997    bilateral cataract extraction  . Knee surgery 1997    left knee  . Refractive surgery 2006  . Nasal sinus surgery 1989    Dr Lazarus Salines  . Cryotherapy 1980  . Colposcopy 1980    reports that she has never smoked. She has never used smokeless tobacco. She reports that she does not drink alcohol. Her drug history not on file. family history includes Cancer in her other; Diabetes in her father and other; Heart disease in her father, maternal grandfather, and maternal grandmother; Hyperlipidemia in her other; and Hypertension in her father, maternal grandfather, maternal grandmother, mother, and other. Allergies  Allergen Reactions  . Codeine     REACTION: hives  . Cortisone     REACTION: palpitations  . 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  No allergy.  . Nitrofurantoin     REACTION: fever  . Prednisone     REACTION:  palpitations       Review of Systems see hpi     Objective:   Physical Exam  Nursing note and vitals reviewed. Constitutional: She appears well-developed and well-nourished. No distress.  HENT:  Head: Normocephalic and atraumatic.  Nose: Right sinus exhibits maxillary sinus tenderness. Right sinus exhibits no frontal sinus tenderness. Left sinus exhibits no maxillary sinus tenderness and no frontal sinus tenderness.  Mouth/Throat: Uvula is midline, oropharynx is clear and moist and mucous membranes are normal.  Skin: She is not diaphoretic.          Assessment & Plan:

## 2010-10-30 DIAGNOSIS — J329 Chronic sinusitis, unspecified: Secondary | ICD-10-CM | POA: Insufficient documentation

## 2010-10-30 DIAGNOSIS — L719 Rosacea, unspecified: Secondary | ICD-10-CM | POA: Insufficient documentation

## 2010-10-30 NOTE — Assessment & Plan Note (Signed)
rf klaron lotion

## 2010-11-01 ENCOUNTER — Ambulatory Visit (INDEPENDENT_AMBULATORY_CARE_PROVIDER_SITE_OTHER): Payer: PRIVATE HEALTH INSURANCE | Admitting: Internal Medicine

## 2010-11-01 ENCOUNTER — Encounter: Payer: Self-pay | Admitting: Internal Medicine

## 2010-11-01 ENCOUNTER — Telehealth: Payer: Self-pay | Admitting: *Deleted

## 2010-11-01 DIAGNOSIS — J329 Chronic sinusitis, unspecified: Secondary | ICD-10-CM

## 2010-11-01 MED ORDER — CHLORPHENIRAMINE-HYDROCODONE 8-10 MG/5ML PO LQCR: 5.0000 mL | Freq: Two times a day (BID) | ORAL | Status: DC | PRN

## 2010-11-01 NOTE — Telephone Encounter (Signed)
Patient called and left voice message stating she was given a Rx for Augmentin that she started Saturday. She stated that she feels a lot worse. She has developed a productive cough and a fever of 100.7 yesterday. She has taken tylenol, mucinex, and delsym with some relief from fever. She would like to know if there is another antibiotic that she could take, or does she need to be seen again.

## 2010-11-01 NOTE — Telephone Encounter (Signed)
Patient scheduled for evaluation in office 11/01/2010. No action required on phone note.

## 2010-11-03 ENCOUNTER — Ambulatory Visit: Payer: PRIVATE HEALTH INSURANCE | Admitting: Internal Medicine

## 2010-11-03 ENCOUNTER — Telehealth: Payer: Self-pay | Admitting: *Deleted

## 2010-11-03 MED ORDER — LEVOFLOXACIN 500 MG PO TABS: 500.0000 mg | ORAL_TABLET | Freq: Every day | ORAL | Status: AC

## 2010-11-03 NOTE — Telephone Encounter (Signed)
Call placed to patient at [phone] to check status. She states that she continues to have headaches, congestion, body aches and pain. She has had a low grade fever at night of 100 that is relieved by Tylenol.  She states that she is taking the cough syrup, but uses sparingly because it makes her sleepy, and she is continuing with the antibiotics. She stated that she is concerned that her symptoms are not improving, and that her daughter is now showing similar symptoms.

## 2010-11-03 NOTE — Telephone Encounter (Signed)
Call placed to patient at [phone] she was informed per Dr Rodena Medin instruction and has verbalized understanding, and agrees as instructed.  Rx for Levaquin sent to pharmacy.

## 2010-11-03 NOTE — Telephone Encounter (Signed)
Recently discouraged changing abx but if she feels her sx's are worsening can change augmentin to levaquin 500mg  po qd x 7d if not allergic and no interactions. Happy to see her if not getting better with abx. Also if other family members are getting similar sx's that could suggest it might be viral

## 2010-11-07 NOTE — Assessment & Plan Note (Signed)
Recently began augmentin. Continue current abx. Begin tussionex prn. Cautioned re possible sedating effect. Followup if no improvement or worsening.

## 2010-11-07 NOTE — Progress Notes (Signed)
  Subjective:    Patient ID: [name] [name], female    DOB: [birth year], 55 y.o.   MRN: [id number]  HPI Pt presents to clinic as a work in for evaluation of uri sx's. Notes continued cough with contribution from drainage, headache provoked by cough and fever tmax 101. Using delsym and mucinex. Currently on augmentin without adverse effect. No other alleviating or exacerbating factors. No other complaints.  Past Medical History  Diagnosis Date  . GERD (gastroesophageal reflux disease)   . Allergy   . Osteoporosis   . Obesity     status post bariatric procedure in [facility] 2005  . Borderline hypertension   . Elevated alkaline phosphatase level     negative workup (presumed secondary to fatty liver)  . Thyroid nodule 7/10    `  . Interstitial cystitis     Dr Normajean Baxter  . Osteoporosis   . 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 Joint replacement 2008    left total knee  . Stomach surgery 2007    "tummy tuck"  . Gastric bypass 2005  . Eye surgery 1997    bilateral cataract extraction  . Knee surgery 1997    left knee  . Refractive surgery 2006  . Nasal sinus surgery 1989    Dr Lazarus Salines  . Cryotherapy 1980  . Colposcopy 1980    reports that she has never smoked. She has never used smokeless tobacco. She reports that she does not drink alcohol. Her drug history not on file. family history includes Cancer in her other; Diabetes in her father and other; Heart disease in her father, maternal grandfather, and maternal grandmother; Hyperlipidemia in her other; and Hypertension in her father, maternal grandfather, maternal grandmother, mother, and other. Allergies  Allergen Reactions  . Codeine     REACTION: hives  . Cortisone     REACTION: palpitations  . 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  No allergy.  . Nitrofurantoin     REACTION: fever  . Prednisone     REACTION: palpitations     Review of Systems  see hpi     Objective:   Physical Exam  Nursing note and vitals reviewed. Constitutional: She appears well-developed and well-nourished. No distress.  HENT:  Head: Normocephalic and atraumatic.  Right Ear: Tympanic membrane, external ear and ear canal normal.  Left Ear: Tympanic membrane, external ear and ear canal normal.  Nose: Nose normal.  Mouth/Throat: Oropharynx is clear and moist. No oropharyngeal exudate.  Eyes: Conjunctivae are normal. Right eye exhibits no discharge. Left eye exhibits no discharge. No scleral icterus.  Neck: Neck supple.  Cardiovascular: Normal rate, regular rhythm and normal heart sounds.   Pulmonary/Chest: Effort normal and breath sounds normal. No respiratory distress. She has no wheezes. She has no rales.  Lymphadenopathy:    She has no cervical adenopathy.  Neurological: She is alert.  Skin: Skin is warm and dry. She is not diaphoretic.  Psychiatric: She has a normal mood and affect.          Assessment & Plan:

## 2010-11-12 ENCOUNTER — Telehealth: Payer: Self-pay | Admitting: Internal Medicine

## 2010-11-12 NOTE — Telephone Encounter (Signed)
Refill-furosemide 20mg  tablets. Take one tablet by mouth every day as directed. Qty 30. Last fill 8.23.12

## 2010-11-12 NOTE — Telephone Encounter (Signed)
Call placed to  [facility] pharmacy [phone], spoke with pharmacist. He was informed Rx refill on file from August. Pharmacist has verified the requested medication is on file with refills remaining. No action required.

## 2010-11-18 ENCOUNTER — Other Ambulatory Visit: Payer: Self-pay | Admitting: *Deleted

## 2010-11-18 MED ORDER — FLUCONAZOLE 150 MG PO TABS: ORAL_TABLET | ORAL | Status: DC

## 2010-11-30 LAB — POCT HEMOGLOBIN-HEMACUE
Hemoglobin: 12.7
Operator id: 268271

## 2010-12-06 ENCOUNTER — Telehealth: Payer: Self-pay | Admitting: Internal Medicine

## 2010-12-06 MED ORDER — ALPRAZOLAM 0.5 MG PO TABS: 0.5000 mg | ORAL_TABLET | Freq: Two times a day (BID) | ORAL | Status: DC

## 2010-12-06 NOTE — Telephone Encounter (Signed)
#  60 rf2

## 2010-12-06 NOTE — Telephone Encounter (Signed)
Is it okay to provide refills on Alprazolam?

## 2010-12-06 NOTE — Telephone Encounter (Signed)
Verbal refill order provided to pharmacist.

## 2010-12-06 NOTE — Telephone Encounter (Signed)
Refill- alprazolam 0.5mg  tablets. Take one tablet by mouth twice daily as needed. Qty 60. Last fill 9.21.12

## 2011-01-14 ENCOUNTER — Telehealth: Payer: Self-pay | Admitting: *Deleted

## 2011-01-14 MED ORDER — TERCONAZOLE 0.8 % VA CREA: 1.0000 | TOPICAL_CREAM | Freq: Every day | VAGINAL | Status: AC

## 2011-01-14 NOTE — Telephone Encounter (Signed)
Patient informed below, rx sent.

## 2011-01-14 NOTE — Telephone Encounter (Signed)
Please call in Terazol 3 one applicator at bedtime x3. Instruct no relief office visit.

## 2011-01-14 NOTE — Telephone Encounter (Signed)
Patient called c/o yeast infection.  Has used 4 pills of Diflucan and has had no relief.  Patient asking for Terazol cream.  Has had in the past.  Leaving out of town for business trip. (see's DG) Can we call in?

## 2011-02-02 ENCOUNTER — Encounter: Payer: Self-pay | Admitting: Internal Medicine

## 2011-02-02 ENCOUNTER — Ambulatory Visit (INDEPENDENT_AMBULATORY_CARE_PROVIDER_SITE_OTHER): Payer: PRIVATE HEALTH INSURANCE | Admitting: Internal Medicine

## 2011-02-02 VITALS — BP 126/70 | HR 108 | Temp 98.3°F | Resp 18 | Wt 227.0 lb

## 2011-02-02 DIAGNOSIS — R300 Dysuria: Secondary | ICD-10-CM

## 2011-02-02 DIAGNOSIS — N390 Urinary tract infection, site not specified: Secondary | ICD-10-CM | POA: Insufficient documentation

## 2011-02-02 LAB — POCT URINALYSIS DIPSTICK
Ketones, UA: NEGATIVE
Protein, UA: NEGATIVE
Urobilinogen, UA: 0.2
pH, UA: 6

## 2011-02-02 MED ORDER — CIPROFLOXACIN HCL 500 MG PO TABS: 500.0000 mg | ORAL_TABLET | Freq: Two times a day (BID) | ORAL | Status: AC

## 2011-02-02 NOTE — Progress Notes (Signed)
  Subjective:    Patient ID: [name] [name], female    DOB: 1956-01-06, 55 y.o.   MRN: [id number]  HPI Pt presents to clinic for evaluation of urinary discomfort. Notes 5-6 day h/o dysuria without fever, chills, abdominal pain, back pain, nausea or vomiting. No alleviating or exacerbating factors. Took medication prn for spasm. Reviewed UA with +3 LE. No other complaints.  Past Medical History  Diagnosis Date  . GERD (gastroesophageal reflux disease)   . Allergy   . Osteoporosis   . Obesity     status post bariatric procedure in [facility] 2005  . Borderline hypertension   . Elevated alkaline phosphatase level     negative workup (presumed secondary to fatty liver)  . Thyroid nodule 7/10    `  . Interstitial cystitis     Dr Normajean Baxter  . Osteoporosis   . 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 Joint replacement 2008    left total knee  . Stomach surgery 2007    "tummy tuck"  . Gastric bypass 2005  . Eye surgery 1997    bilateral cataract extraction  . Knee surgery 1997    left knee  . Refractive surgery 2006  . Nasal sinus surgery 1989    Dr Lazarus Salines  . Cryotherapy 1980  . Colposcopy 1980    reports that she has never smoked. She has never used smokeless tobacco. She reports that she does not drink alcohol. Her drug history not on file. family history includes Cancer in her other; Diabetes in her father and other; Heart disease in her father, maternal grandfather, and maternal grandmother; Hyperlipidemia in her other; and Hypertension in her father, maternal grandfather, maternal grandmother, mother, and other. Allergies  Allergen Reactions  . Codeine     REACTION: hives  . Cortisone     REACTION: palpitations  . 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  No allergy.  . Nitrofurantoin     REACTION: fever  . Prednisone     REACTION: palpitations     Review of Systems see hpi     Objective:   Physical Exam  Nursing note and vitals reviewed. Constitutional: She appears well-developed and well-nourished. No distress.  HENT:  Head: Normocephalic and atraumatic.  Neurological: She is alert.  Skin: She is not diaphoretic.  Psychiatric: She has a normal mood and affect.          Assessment & Plan:

## 2011-02-02 NOTE — Assessment & Plan Note (Signed)
Begin cipro x 5 days. Followup if no improvement or worsening.

## 2011-02-04 ENCOUNTER — Telehealth: Payer: Self-pay | Admitting: Internal Medicine

## 2011-02-04 MED ORDER — ALPRAZOLAM 0.5 MG PO TABS: 0.5000 mg | ORAL_TABLET | Freq: Two times a day (BID) | ORAL | Status: DC

## 2011-02-04 NOTE — Telephone Encounter (Signed)
ok 

## 2011-02-04 NOTE — Telephone Encounter (Signed)
Rx refill called to pharmacy.

## 2011-02-04 NOTE — Telephone Encounter (Signed)
Refill-alprazolam 0.5mg  tablets. Take one tablet by mouth twice daily as needed. Qty 60 last fill 11.20.12

## 2011-03-03 ENCOUNTER — Telehealth: Payer: Self-pay | Admitting: Internal Medicine

## 2011-03-03 ENCOUNTER — Encounter: Payer: Self-pay | Admitting: Internal Medicine

## 2011-03-03 ENCOUNTER — Ambulatory Visit (INDEPENDENT_AMBULATORY_CARE_PROVIDER_SITE_OTHER): Payer: PRIVATE HEALTH INSURANCE | Admitting: Internal Medicine

## 2011-03-03 DIAGNOSIS — D649 Anemia, unspecified: Secondary | ICD-10-CM

## 2011-03-03 DIAGNOSIS — E041 Nontoxic single thyroid nodule: Secondary | ICD-10-CM

## 2011-03-03 DIAGNOSIS — Z79899 Other long term (current) drug therapy: Secondary | ICD-10-CM

## 2011-03-03 LAB — BASIC METABOLIC PANEL
CO2: 22 mEq/L (ref 19–32)
Calcium: 8.7 mg/dL (ref 8.4–10.5)
Creat: 0.53 mg/dL (ref 0.50–1.10)
Glucose, Bld: 92 mg/dL (ref 70–99)

## 2011-03-03 LAB — HEPATIC FUNCTION PANEL
Albumin: 4 g/dL (ref 3.5–5.2)
Total Protein: 7.2 g/dL (ref 6.0–8.3)

## 2011-03-03 NOTE — Patient Instructions (Signed)
Please schedule chem7, lft v58.69 and cbc-anemia prior to next visit

## 2011-03-03 NOTE — Assessment & Plan Note (Signed)
Obtain tsh. Discuss scheduling thyroid US next visit.

## 2011-03-03 NOTE — Telephone Encounter (Signed)
Lab orders entered for July 2013. 

## 2011-03-03 NOTE — Progress Notes (Signed)
  Subjective:    Patient ID: [name] [name], female    DOB: Jul 12, 1955, 56 y.o.   MRN: [id number]  HPI Pt presents to clinic for followup of multiple medical problems. S/p bariatric surgery several years ago.  H/o mild anemia with no gross active bleeding. Vit d def with last vit d level nl. H/o thyroid nodule s/p reported neg bx. Last [place] stable 8/11. No active complaints.    Past Medical History  Diagnosis Date  . GERD (gastroesophageal reflux disease)   . Allergy   . Osteoporosis   . Obesity     status post bariatric procedure in [facility] 2005  . Borderline hypertension   . Elevated alkaline phosphatase level     negative workup (presumed secondary to fatty liver)  . Thyroid nodule 7/10    `  . Interstitial cystitis     Dr Normajean Baxter  . Osteoporosis   . 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 Joint replacement 2008    left total knee  . Stomach surgery 2007    "tummy tuck"  . Gastric bypass 2005  . Eye surgery 1997    bilateral cataract extraction  . Knee surgery 1997    left knee  . Refractive surgery 2006  . Nasal sinus surgery 1989    Dr Lazarus Salines  . Cryotherapy 1980  . Colposcopy 1980    reports that she has never smoked. She has never used smokeless tobacco. She reports that she does not drink alcohol. Her drug history not on file. family history includes Cancer in her other; Diabetes in her father and other; Heart disease in her father, maternal grandfather, and maternal grandmother; Hyperlipidemia in her other; and Hypertension in her father, maternal grandfather, maternal grandmother, mother, and other. Allergies  Allergen Reactions  . Codeine     REACTION: hives  . Cortisone     REACTION: palpitations  . 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  No allergy.  . Nitrofurantoin     REACTION: fever  . Prednisone     REACTION: palpitations     Review of Systems see hpi     Objective:   Physical Exam  Nursing note and vitals reviewed. Constitutional: She appears well-developed and well-nourished. No distress.  HENT:  Head: Normocephalic and atraumatic.  Right Ear: External ear normal.  Left Ear: External ear normal.  Eyes: Conjunctivae are normal. No scleral icterus.  Neck: Neck supple. No thyromegaly present.  Cardiovascular: Normal rate, regular rhythm and normal heart sounds.   Pulmonary/Chest: Effort normal and breath sounds normal. No respiratory distress. She has no wheezes. She has no rales.  Neurological: She is alert.  Skin: Skin is warm and dry. She is not diaphoretic.  Psychiatric: She has a normal mood and affect.          Assessment & Plan:

## 2011-03-03 NOTE — Assessment & Plan Note (Signed)
Obtain cbc, b12, iron 

## 2011-03-04 LAB — CBC WITH DIFFERENTIAL/PLATELET
Basophils Absolute: 0 10*3/uL (ref 0.0–0.1)
Basophils Relative: 1 % (ref 0–1)
Eosinophils Relative: 3 % (ref 0–5)
HCT: 36.3 % (ref 36.0–46.0)
MCHC: 30 g/dL (ref 30.0–36.0)
Monocytes Absolute: 0.4 10*3/uL (ref 0.1–1.0)
Neutro Abs: 3.7 10*3/uL (ref 1.7–7.7)
Platelets: 344 10*3/uL (ref 150–400)
RDW: 17.5 % — ABNORMAL HIGH (ref 11.5–15.5)

## 2011-03-04 LAB — TSH: TSH: 0.553 u[IU]/mL (ref 0.350–4.500)

## 2011-03-09 ENCOUNTER — Other Ambulatory Visit: Payer: Self-pay | Admitting: Internal Medicine

## 2011-03-09 DIAGNOSIS — D649 Anemia, unspecified: Secondary | ICD-10-CM

## 2011-03-09 MED ORDER — FERROUS SULFATE 325 (65 FE) MG PO TABS: 325.0000 mg | ORAL_TABLET | Freq: Two times a day (BID) | ORAL | Status: DC

## 2011-03-30 ENCOUNTER — Encounter: Payer: Self-pay | Admitting: Family

## 2011-03-30 ENCOUNTER — Ambulatory Visit (INDEPENDENT_AMBULATORY_CARE_PROVIDER_SITE_OTHER): Payer: PRIVATE HEALTH INSURANCE | Admitting: Family

## 2011-03-30 DIAGNOSIS — J329 Chronic sinusitis, unspecified: Secondary | ICD-10-CM

## 2011-03-30 MED ORDER — AMOXICILLIN-POT CLAVULANATE 875-125 MG PO TABS: 1.0000 | ORAL_TABLET | Freq: Two times a day (BID) | ORAL | Status: AC

## 2011-03-30 MED ORDER — HYDROCOD POLST-CHLORPHEN POLST 10-8 MG/5ML PO LQCR: 5.0000 mL | Freq: Every evening | ORAL | Status: DC | PRN

## 2011-03-30 NOTE — Patient Instructions (Signed)

## 2011-03-30 NOTE — Progress Notes (Signed)
Subjective:    Patient ID: [name] [name], female    DOB: 1955-07-01, 56 y.o.   MRN: [id number]  HPI  [name] [name] is a 56 yr old female who presents today with chief complaint of sinus pressure.  Notes that symptoms started 7 days ago and were associated with sneezing.  Has been using mucinex D and delsym without significant improvement.  Has facial pressure, and ears hurt. When she blows her nose she notes "scabs" and "blood" which is mixed with yellow discharge.    Review of Systems See HPI  Past Medical History  Diagnosis Date  . GERD (gastroesophageal reflux disease)   . Allergy   . Osteoporosis   . Obesity     status post bariatric procedure in [facility] 2005  . Borderline hypertension   . Elevated alkaline phosphatase level     negative workup (presumed secondary to fatty liver)  . Thyroid nodule 7/10    `  . Interstitial cystitis     Dr Normajean Baxter  . Osteoporosis   . 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 Marital Status: Single    Spouse Name: N/A    Number of Children: 1  . Years of Education: N/A   Occupational History  . MANAGER    Social History Main Topics  . Smoking status: Never Smoker   . Smokeless tobacco: Never Used  . Alcohol Use: No  . Drug Use: Not on file  . Sexually Active: Not on file   Other Topics Concern  . Not on file   Social History Narrative  . No narrative on file    Past Surgical History  Procedure Date  . Joint replacement 2008    left total knee  . Stomach surgery 2007    "tummy tuck"  . Gastric bypass 2005  . Eye surgery 1997    bilateral cataract extraction  . Knee surgery 1997    left knee  . Refractive surgery 2006  . Nasal sinus surgery 1989    Dr Lazarus Salines  . Cryotherapy 1980  . Colposcopy 1980    Family History  Problem Relation Age of Onset  . Diabetes Other   . Hyperlipidemia Other   . Hypertension Other   . Cancer Other     prostate  . Hypertension Mother   . Heart  disease Father   . Hypertension Father   . Diabetes Father   . Heart disease Maternal Grandmother   . Hypertension Maternal Grandmother   . Heart disease Maternal Grandfather   . Hypertension Maternal Grandfather     Allergies  Allergen Reactions  . Codeine     REACTION: hives  . Cortisone     REACTION: palpitations  . 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  No allergy.  . Nitrofurantoin     REACTION: fever  . Prednisone     REACTION: palpitations    Current Outpatient Prescriptions on File Prior to Visit  Medication Sig Dispense Refill  . ALPRAZolam (XANAX) 0.5 MG tablet Take 1 tablet (0.5 mg total) by mouth 2 (two) times daily.  60 tablet  0  . amitriptyline (ELAVIL) 50 MG tablet Take 1/2 tablet by mouth at bedtime.       . Azelastine HCl (ASTEPRO) 0.15 % SOLN 1 spray by Each Nare route 2 (two) times daily.        . Bepotastine Besilate (BEPREVE) 1.5 %  SOLN Apply 1 drop to eye 2 (two) times daily as needed.        . calcium elemental as carbonate (TUMS ULTRA 1000) 400 MG tablet Chew 1,000 mg by mouth 2 (two) times daily.        . cetirizine (ZYRTEC) 10 MG tablet Take 10 mg by mouth daily.        . cholecalciferol (VITAMIN D) 1000 UNITS tablet Take 4,000 Units by mouth daily. Take 2 tablets by mouth twice a day      . ferrous sulfate 325 (65 FE) MG tablet Take 1 tablet (325 mg total) by mouth 2 (two) times daily.  60 tablet  6  . furosemide (LASIX) 20 MG tablet One to two by mouth daily  60 tablet  11  . mometasone (NASONEX) 50 MCG/ACT nasal spray Place 1 spray into the nose 2 (two) times daily.       . Multiple Vitamin (MULTIVITAMIN) tablet Take 1 tablet by mouth 2 (two) times daily.        . Omega-3 Fatty Acids (FISH OIL) 1000 MG CAPS Take 1 capsule by mouth 2 (two) times daily.        Marland Kitchen omeprazole (PRILOSEC) 40 MG capsule Take 40 mg by mouth 2 (two) times daily.        . Risedronate Sodium (ATELVIA) 35 MG TBEC Take 35 mg by mouth once a  week.        . sertraline (ZOLOFT) 25 MG tablet Take 50 mg by mouth daily.        . valACYclovir (VALTREX) 500 MG tablet Take 500 mg by mouth daily as needed.        . vitamin A 14782 UNIT capsule Take 10,000 Units by mouth daily.          BP 146/78  Pulse 89  Temp(Src) 98.3 F (36.8 C) (Oral)  Resp 16  Wt 229 lb 0.6 oz (103.892 kg)  SpO2 93%       Objective:   Physical Exam  Constitutional: She is oriented to person, place, and time. She appears well-developed and well-nourished. No distress.  HENT:  Right Ear: Tympanic membrane and ear canal normal.  Left Ear: Tympanic membrane and ear canal normal.  Mouth/Throat: No posterior oropharyngeal edema or posterior oropharyngeal erythema.       + maxillary sinus tenderness to palpation.   Cardiovascular: Normal rate and regular rhythm.   No murmur heard. Pulmonary/Chest: Effort normal and breath sounds normal.  Neurological: She is alert and oriented to person, place, and time.  Psychiatric: She has a normal mood and affect. Her behavior is normal. Judgment and thought content normal.          Assessment & [name]:

## 2011-04-01 DIAGNOSIS — J329 Chronic sinusitis, unspecified: Secondary | ICD-10-CM | POA: Insufficient documentation

## 2011-04-01 NOTE — Assessment & Plan Note (Addendum)
Will plan to treat with Augmentin. She is requesting rx for Tussionex for cough which has helped her in the past. I also recommended that she use a nasal saline spray to help with her nasal dryness.

## 2011-04-08 ENCOUNTER — Telehealth: Payer: Self-pay | Admitting: *Deleted

## 2011-04-08 MED ORDER — TERCONAZOLE 0.8 % VA CREA: 1.0000 | TOPICAL_CREAM | Freq: Every day | VAGINAL | Status: AC

## 2011-04-08 NOTE — Telephone Encounter (Signed)
Pt called c/o yeast infection x 4 days due to taking Augmentin for sinus infection. C/o itching, white discharge and burning. Pt is requesting Terazol cream. Please advise

## 2011-04-08 NOTE — Telephone Encounter (Signed)
Okay to give her Terazole 3 cream.

## 2011-04-08 NOTE — Telephone Encounter (Signed)
Pt informed with the below note. rx sent to pharmacy 

## 2011-04-11 ENCOUNTER — Telehealth: Payer: Self-pay | Admitting: *Deleted

## 2011-04-11 NOTE — Telephone Encounter (Signed)
Opened in error

## 2011-04-12 ENCOUNTER — Telehealth: Payer: Self-pay | Admitting: Internal Medicine

## 2011-04-12 MED ORDER — ALPRAZOLAM 0.5 MG PO TABS: 0.5000 mg | ORAL_TABLET | Freq: Two times a day (BID) | ORAL | Status: DC

## 2011-04-12 NOTE — Telephone Encounter (Signed)
Rx refill called to pharmacy provider voice mail box.

## 2011-04-12 NOTE — Telephone Encounter (Signed)
#  60 rf2 

## 2011-04-26 ENCOUNTER — Other Ambulatory Visit: Payer: Self-pay | Admitting: Obstetrics and Gynecology

## 2011-04-26 DIAGNOSIS — Z1231 Encounter for screening mammogram for malignant neoplasm of breast: Secondary | ICD-10-CM

## 2011-05-31 ENCOUNTER — Ambulatory Visit (HOSPITAL_BASED_OUTPATIENT_CLINIC_OR_DEPARTMENT_OTHER)
Admission: RE | Admit: 2011-05-31 | Discharge: 2011-05-31 | Disposition: A | Discharge status: Home / Self Care | Payer: PRIVATE HEALTH INSURANCE | Source: Ambulatory Visit | Attending: Obstetrics and Gynecology | Admitting: Obstetrics and Gynecology

## 2011-05-31 DIAGNOSIS — Z1231 Encounter for screening mammogram for malignant neoplasm of breast: Secondary | ICD-10-CM | POA: Insufficient documentation

## 2011-06-16 ENCOUNTER — Telehealth: Payer: Self-pay | Admitting: *Deleted

## 2011-06-16 MED ORDER — TERCONAZOLE 0.8 % VA CREA: TOPICAL_CREAM | VAGINAL | Status: DC

## 2011-06-16 NOTE — Telephone Encounter (Signed)
Addended by: Aura Camps on: 06/16/2011 10:02 AM   Modules accepted: Orders

## 2011-06-16 NOTE — Telephone Encounter (Signed)
Give her Terazole-3 cream with 2 refills.

## 2011-06-16 NOTE — Telephone Encounter (Signed)
Pt informed with the below note, rx sent to pharmacy. 

## 2011-06-16 NOTE — Telephone Encounter (Signed)
Pt called requesting Terazol vaginal cream for recurrent yeast infection. C/o itching,white discharge, irritation, okay to fill?

## 2011-07-22 ENCOUNTER — Telehealth: Payer: Self-pay

## 2011-07-22 ENCOUNTER — Telehealth: Payer: Self-pay | Admitting: Internal Medicine

## 2011-07-22 MED ORDER — ALPRAZOLAM 0.5 MG PO TABS: 0.5000 mg | ORAL_TABLET | Freq: Two times a day (BID) | ORAL | Status: DC

## 2011-07-22 NOTE — Telephone Encounter (Signed)
Patient called and left voice message requesting refill on Xanax to [facility] pharmacy on file. Verbal refill provided to pharmacist Ramesh per Dr Rodena Medin okay. Call placed to patient at [phone], she was informed Rx sent to pharmacy.

## 2011-07-22 NOTE — Telephone Encounter (Signed)
Message copied by Donata Duff on Fri Jul 22, 2011  9:16 AM ------      Message from: Richardson Chiquito      Created: Tue Aug 03, 2010 11:22 AM       Pt needs repeat lft's completed around 08/03/11 (future labs already in epic). Call patient to remind her! (see 08/03/10 phone note)

## 2011-07-22 NOTE — Telephone Encounter (Signed)
Pt has been notified to come in for labs  

## 2011-08-03 ENCOUNTER — Ambulatory Visit (INDEPENDENT_AMBULATORY_CARE_PROVIDER_SITE_OTHER): Payer: PRIVATE HEALTH INSURANCE | Admitting: Obstetrics and Gynecology

## 2011-08-03 ENCOUNTER — Other Ambulatory Visit (INDEPENDENT_AMBULATORY_CARE_PROVIDER_SITE_OTHER): Payer: PRIVATE HEALTH INSURANCE

## 2011-08-03 ENCOUNTER — Encounter: Payer: Self-pay | Admitting: Obstetrics and Gynecology

## 2011-08-03 VITALS — BP 124/80 | Ht 64.5 in | Wt 232.0 lb

## 2011-08-03 DIAGNOSIS — Z113 Encounter for screening for infections with a predominantly sexual mode of transmission: Secondary | ICD-10-CM

## 2011-08-03 DIAGNOSIS — M949 Disorder of cartilage, unspecified: Secondary | ICD-10-CM

## 2011-08-03 DIAGNOSIS — M899 Disorder of bone, unspecified: Secondary | ICD-10-CM

## 2011-08-03 DIAGNOSIS — R748 Abnormal levels of other serum enzymes: Secondary | ICD-10-CM

## 2011-08-03 DIAGNOSIS — Z01419 Encounter for gynecological examination (general) (routine) without abnormal findings: Secondary | ICD-10-CM

## 2011-08-03 DIAGNOSIS — M8580 Other specified disorders of bone density and structure, unspecified site: Secondary | ICD-10-CM

## 2011-08-03 LAB — HEPATIC FUNCTION PANEL
Bilirubin, Direct: 0 mg/dL (ref 0.0–0.3)
Total Bilirubin: 0.2 mg/dL — ABNORMAL LOW (ref 0.3–1.2)

## 2011-08-03 MED ORDER — TERCONAZOLE 0.8 % VA CREA: 1.0000 | TOPICAL_CREAM | Freq: Every day | VAGINAL | Status: AC

## 2011-08-03 MED ORDER — VALACYCLOVIR HCL 500 MG PO TABS: 500.0000 mg | ORAL_TABLET | Freq: Every day | ORAL | Status: DC

## 2011-08-03 MED ORDER — ALENDRONATE SODIUM 70 MG PO TABS: 70.0000 mg | ORAL_TABLET | ORAL | Status: DC

## 2011-08-03 MED ORDER — SERTRALINE HCL 50 MG PO TABS: 50.0000 mg | ORAL_TABLET | Freq: Every day | ORAL | Status: DC

## 2011-08-03 NOTE — Progress Notes (Signed)
Patient came to see me today for her annual GYN exam. She is doing well without HRT. She is having no menopausal symptoms. She is having no vaginal bleeding. She has no pelvic pain. She will get recurrent yeast infections that we treat with Terazol. She is currently on Fosamax for osteopenia and this month will be 5 years on a biphosphonate. She will schedule her bone density with hopes of a drug holiday. 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 Her last Pap smear was June,  2012.she does her lab work elsewhere. She is having trouble with varicosities in her legs which had not responded to medical treatment. She had a normal mammogram this year. She does her lab work elsewhere. She started to take Valtrex daily because she has a new partner and she has a history of HSV.  Physical examination: Elane Fritz present. HEENT within normal limits. Neck: Thyroid not large. No masses. Supraclavicular nodes: not enlarged. Breasts: Examined in both sitting and lying  position. No skin changes and no masses. Abdomen: Soft no guarding rebound or masses or hernia. Pelvic: External: Within normal limits. BUS: Within normal limits. Vaginal:within normal limits. Good estrogen effect. No evidence of cystocele rectocele or enterocele. Cervix: clean. Uterus: Normal size and shape. Adnexa: No masses. Rectovaginal exam: Confirmatory and negative. Extremities:bilateral lower leg varicosities with edema.  Assessment: #1. Osteopenia on biphosphonate for 5 years . #2. 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  Plan: Continue Valtrex. Continue Fosamax until we get her bone density back. Terazole when necessary. Appointment with vascular surgeon. Continue Zoloft 50 mg daily. GC and chlamydial cultures done. No Pap done.  Plan: Continue Zoloft 50 mg daily. Continue Valtrex daily as she has a new partner. Bone density.  Continue yearly mammograms.

## 2011-08-03 NOTE — Patient Instructions (Addendum)
Call Dr. Betti Cruz for appointment about varicose veins.

## 2011-08-04 LAB — GC/CHLAMYDIA PROBE AMP, GENITAL: GC Probe Amp, Genital: NEGATIVE

## 2011-08-23 ENCOUNTER — Ambulatory Visit (INDEPENDENT_AMBULATORY_CARE_PROVIDER_SITE_OTHER): Payer: PRIVATE HEALTH INSURANCE

## 2011-08-23 DIAGNOSIS — M899 Disorder of bone, unspecified: Secondary | ICD-10-CM

## 2011-08-23 DIAGNOSIS — M8580 Other specified disorders of bone density and structure, unspecified site: Secondary | ICD-10-CM

## 2011-08-23 DIAGNOSIS — M949 Disorder of cartilage, unspecified: Secondary | ICD-10-CM

## 2011-09-02 ENCOUNTER — Encounter: Payer: Self-pay | Admitting: Internal Medicine

## 2011-09-02 ENCOUNTER — Telehealth: Payer: Self-pay | Admitting: Internal Medicine

## 2011-09-02 ENCOUNTER — Ambulatory Visit (INDEPENDENT_AMBULATORY_CARE_PROVIDER_SITE_OTHER): Payer: PRIVATE HEALTH INSURANCE | Admitting: Internal Medicine

## 2011-09-02 VITALS — BP 112/78 | HR 88 | Temp 98.0°F | Resp 16 | Ht 65.0 in | Wt 232.0 lb

## 2011-09-02 DIAGNOSIS — E7800 Pure hypercholesterolemia, unspecified: Secondary | ICD-10-CM

## 2011-09-02 DIAGNOSIS — D649 Anemia, unspecified: Secondary | ICD-10-CM

## 2011-09-02 DIAGNOSIS — I10 Essential (primary) hypertension: Secondary | ICD-10-CM

## 2011-09-02 DIAGNOSIS — Z79899 Other long term (current) drug therapy: Secondary | ICD-10-CM

## 2011-09-02 DIAGNOSIS — E041 Nontoxic single thyroid nodule: Secondary | ICD-10-CM

## 2011-09-02 LAB — CBC
Hemoglobin: 13 g/dL (ref 12.0–15.0)
MCHC: 33.5 g/dL (ref 30.0–36.0)
RBC: 4.82 MIL/uL (ref 3.87–5.11)
WBC: 7.4 10*3/uL (ref 4.0–10.5)

## 2011-09-02 NOTE — Patient Instructions (Signed)
Please schedule fasting labs prior to next visit TSH-thyroid nodule, lipid-chol screening, cbc-anemia, [phone]

## 2011-09-02 NOTE — Telephone Encounter (Signed)
Please schedule fasting labs prior to next visit  TSH-thyroid nodule, lipid-chol screening, cbc-anemia, [id number]  Patient has upcoming appointment 03/07/12. Patient will be going to [facility] lab

## 2011-09-02 NOTE — Assessment & Plan Note (Signed)
Obtain cbc, ferritin, b12, iron.

## 2011-09-02 NOTE — Assessment & Plan Note (Signed)
Obtain TSH. Schedule thyroid US for follow up

## 2011-09-02 NOTE — Addendum Note (Signed)
Addended by: Regis Bill on: 09/02/2011 08:26 AM   Modules accepted: Orders

## 2011-09-02 NOTE — Progress Notes (Signed)
  Subjective:    Patient ID: [name] [name], female    DOB: 11-18-1955, 56 y.o.   MRN: [id number]  HPI Pt presents to clinic for followup of multiple medical problems. Having leg swelling and pain related to varicosities and seeing vascular surgery. Reviewed past h/o thyroid nodule. Weight increased but attributes to decreased exercise due to leg pain. H/o anemia and has undergone bariatric surgery in the past.  Past Medical History  Diagnosis Date  . GERD (gastroesophageal reflux disease)   . Allergy   . Osteoporosis   . Obesity     status post bariatric procedure in [facility] 2005  . Borderline hypertension   . Elevated alkaline phosphatase level     negative workup (presumed secondary to fatty liver)  . Thyroid nodule 7/10    `  . Interstitial cystitis     Dr Normajean Baxter  . Osteoporosis   . 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 Joint replacement 2008    left total knee  . Stomach surgery 2007    "tummy tuck"  . Gastric bypass 2005  . Eye surgery 1997    bilateral cataract extraction  . Knee surgery 1997    left knee  . Refractive surgery 2006  . Nasal sinus surgery 1989    Dr Lazarus Salines  . Cryotherapy 1980  . Colposcopy 1980  . Shoulder surgery 2 2012    RIGHT     reports that she has never smoked. She has never used smokeless tobacco. She reports that she drinks alcohol. She reports that she does not use illicit drugs. family history includes Cancer in her father and other; Diabetes in her father and other; Heart disease in her father, maternal grandfather, and maternal grandmother; Hyperlipidemia in her other; and Hypertension in her father, maternal grandfather, maternal grandmother, mother, and other. Allergies  Allergen Reactions  . Codeine     REACTION: hives  . Cortisone     REACTION: palpitations  . 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  No allergy.  . Nitrofurantoin     REACTION: fever    . Prednisone     REACTION: palpitations      Review of Systems see hpi     Objective:   Physical Exam  Physical Exam  Nursing note and vitals reviewed. Constitutional: Appears well-developed and well-nourished. No distress.  HENT:  Head: Normocephalic and atraumatic.  Right Ear: External ear normal.  Left Ear: External ear normal.  Eyes: Conjunctivae are normal. No scleral icterus.  Neck: Neck supple. Carotid bruit is not present.  Cardiovascular: Normal rate, regular rhythm and normal heart sounds.  Exam reveals no gallop and no friction rub.   No murmur heard. Pulmonary/Chest: Effort normal and breath sounds normal. No respiratory distress. He has no wheezes. no rales.  Lymphadenopathy:    He has no cervical adenopathy.  Neurological:Alert.  Skin: Skin is warm and dry. Not diaphoretic.  Psychiatric: Has a normal mood and affect.        Assessment & Plan:

## 2011-09-02 NOTE — Assessment & Plan Note (Signed)
Normotensive and stable. Continue current regimen. Monitor bp as outpt and followup in clinic as scheduled.  

## 2011-09-03 LAB — VITAMIN B12: Vitamin B-12: 881 pg/mL (ref 211–911)

## 2011-09-03 LAB — IRON: Iron: 111 ug/dL (ref 42–145)

## 2011-09-03 LAB — TSH: TSH: 1.141 u[IU]/mL (ref 0.350–4.500)

## 2011-09-15 ENCOUNTER — Ambulatory Visit (HOSPITAL_BASED_OUTPATIENT_CLINIC_OR_DEPARTMENT_OTHER)
Admission: RE | Admit: 2011-09-15 | Discharge: 2011-09-15 | Disposition: A | Discharge status: Home / Self Care | Payer: PRIVATE HEALTH INSURANCE | Source: Ambulatory Visit | Attending: Internal Medicine | Admitting: Internal Medicine

## 2011-09-15 DIAGNOSIS — E041 Nontoxic single thyroid nodule: Secondary | ICD-10-CM | POA: Insufficient documentation

## 2011-09-16 ENCOUNTER — Telehealth: Payer: Self-pay | Admitting: Internal Medicine

## 2011-09-16 MED ORDER — FUROSEMIDE 20 MG PO TABS: ORAL_TABLET | ORAL | Status: DC

## 2011-09-16 NOTE — Telephone Encounter (Signed)
Furosemide #60 x 5 refills sent to pharmacy.

## 2011-10-07 NOTE — Telephone Encounter (Signed)
Labs entered... 10/07/11@11 :59am/LMB

## 2011-10-20 ENCOUNTER — Telehealth: Payer: Self-pay | Admitting: Internal Medicine

## 2011-10-20 NOTE — Telephone Encounter (Signed)
Refill- furosemide 20mg  tablet. Take 1-2 tablets by mouth daily. Qty 60 last fill 8.2.13

## 2011-10-20 NOTE — Telephone Encounter (Signed)
Left message on pharmacy voicemail to use refill on file from 09/16/11 #60 x 5 refills and to call if any questions.

## 2011-11-16 ENCOUNTER — Other Ambulatory Visit: Payer: Self-pay | Admitting: Internal Medicine

## 2011-11-16 NOTE — Telephone Encounter (Signed)
Alprazolam request [Last Rx 06.07.13 #60x3]/SLS Please advise.

## 2011-11-16 NOTE — Telephone Encounter (Signed)
#  60 [id number] 

## 2011-11-17 ENCOUNTER — Telehealth: Payer: Self-pay | Admitting: Internal Medicine

## 2011-11-17 MED ORDER — FERROUS SULFATE 325 (65 FE) MG PO TABS: 325.0000 mg | ORAL_TABLET | Freq: Two times a day (BID) | ORAL | Status: DC

## 2011-11-17 NOTE — Telephone Encounter (Signed)
Done/SLS 

## 2011-11-17 NOTE — Telephone Encounter (Signed)
Rx to pharmacy/SLS 

## 2011-11-17 NOTE — Telephone Encounter (Signed)
Ferrous sulfate 325mg  tab qty 60

## 2011-12-06 ENCOUNTER — Other Ambulatory Visit: Payer: Self-pay | Admitting: Urology

## 2011-12-08 ENCOUNTER — Encounter (HOSPITAL_BASED_OUTPATIENT_CLINIC_OR_DEPARTMENT_OTHER): Payer: Self-pay | Admitting: *Deleted

## 2011-12-08 NOTE — Progress Notes (Addendum)
Pt instructed tnpo p mn 10/27 x prilosec, zyrtec, zoloft w sip of water.  To [facility] 10/28 @ 0830.  Needs istat , ekg On arrival.

## 2011-12-09 ENCOUNTER — Ambulatory Visit: Payer: PRIVATE HEALTH INSURANCE | Admitting: Gastroenterology

## 2011-12-12 ENCOUNTER — Ambulatory Visit (HOSPITAL_BASED_OUTPATIENT_CLINIC_OR_DEPARTMENT_OTHER): Payer: PRIVATE HEALTH INSURANCE | Admitting: Anesthesiology

## 2011-12-12 ENCOUNTER — Encounter (HOSPITAL_BASED_OUTPATIENT_CLINIC_OR_DEPARTMENT_OTHER): Payer: Self-pay | Admitting: Anesthesiology

## 2011-12-12 ENCOUNTER — Encounter (HOSPITAL_BASED_OUTPATIENT_CLINIC_OR_DEPARTMENT_OTHER): Payer: Self-pay | Admitting: *Deleted

## 2011-12-12 ENCOUNTER — Ambulatory Visit (HOSPITAL_BASED_OUTPATIENT_CLINIC_OR_DEPARTMENT_OTHER)
Admission: RE | Admit: 2011-12-12 | Discharge: 2011-12-12 | Disposition: A | Discharge status: Home / Self Care | Payer: PRIVATE HEALTH INSURANCE | Source: Ambulatory Visit | Attending: Urology | Admitting: Urology

## 2011-12-12 ENCOUNTER — Encounter (HOSPITAL_BASED_OUTPATIENT_CLINIC_OR_DEPARTMENT_OTHER)
Admission: RE | Disposition: A | Discharge status: Home / Self Care | Payer: Self-pay | Source: Ambulatory Visit | Attending: Urology

## 2011-12-12 DIAGNOSIS — K219 Gastro-esophageal reflux disease without esophagitis: Secondary | ICD-10-CM | POA: Insufficient documentation

## 2011-12-12 DIAGNOSIS — I10 Essential (primary) hypertension: Secondary | ICD-10-CM | POA: Insufficient documentation

## 2011-12-12 DIAGNOSIS — M810 Age-related osteoporosis without current pathological fracture: Secondary | ICD-10-CM | POA: Insufficient documentation

## 2011-12-12 DIAGNOSIS — Z96659 Presence of unspecified artificial knee joint: Secondary | ICD-10-CM | POA: Insufficient documentation

## 2011-12-12 DIAGNOSIS — Z951 Presence of aortocoronary bypass graft: Secondary | ICD-10-CM | POA: Insufficient documentation

## 2011-12-12 DIAGNOSIS — N3010 Interstitial cystitis (chronic) without hematuria: Secondary | ICD-10-CM | POA: Insufficient documentation

## 2011-12-12 HISTORY — DX: Anemia, unspecified: D64.9

## 2011-12-12 HISTORY — DX: Other neuromuscular dysfunction of bladder: N31.8

## 2011-12-12 HISTORY — DX: Unspecified osteoarthritis, unspecified site: M19.90

## 2011-12-12 HISTORY — DX: Myoneural disorder, unspecified: G70.9

## 2011-12-12 HISTORY — PX: CYSTO WITH HYDRODISTENSION: SHX5453

## 2011-12-12 LAB — POCT I-STAT 4, (NA,K, GLUC, HGB,HCT)
Glucose, Bld: 91 mg/dL (ref 70–99)
Potassium: 3.8 mEq/L (ref 3.5–5.1)
Sodium: 143 mEq/L (ref 135–145)

## 2011-12-12 SURGERY — CYSTOSCOPY, WITH BLADDER HYDRODISTENSION
Anesthesia: General | Site: Bladder | Wound class: Clean Contaminated

## 2011-12-12 MED ORDER — MORPHINE SULFATE 2 MG/ML IJ SOLN: 2.0000 mg | INTRAMUSCULAR | Status: DC | PRN

## 2011-12-12 MED ORDER — OXYCODONE HCL 5 MG PO TABS: 5.0000 mg | ORAL_TABLET | ORAL | Status: DC | PRN

## 2011-12-12 MED ORDER — SODIUM CHLORIDE 0.9 % IJ SOLN: 3.0000 mL | Freq: Two times a day (BID) | INTRAMUSCULAR | Status: DC

## 2011-12-12 MED ORDER — LACTATED RINGERS IV SOLN
INTRAVENOUS | Status: DC
  Administered 2011-12-12: 09:00:00 via INTRAVENOUS

## 2011-12-12 MED ORDER — PROPOFOL 10 MG/ML IV BOLUS
INTRAVENOUS | Status: DC | PRN
  Administered 2011-12-12: 250 mg via INTRAVENOUS

## 2011-12-12 MED ORDER — STERILE WATER FOR IRRIGATION IR SOLN
Status: DC | PRN
  Administered 2011-12-12: 1500 mL via INTRAVESICAL

## 2011-12-12 MED ORDER — CIPROFLOXACIN IN D5W 400 MG/200ML IV SOLN
400.0000 mg | INTRAVENOUS | Status: AC
  Administered 2011-12-12: 400 mg via INTRAVENOUS

## 2011-12-12 MED ORDER — SODIUM CHLORIDE 0.9 % IV SOLN: 250.0000 mL | INTRAVENOUS | Status: DC | PRN

## 2011-12-12 MED ORDER — LACTATED RINGERS IV SOLN
INTRAVENOUS | Status: DC | PRN
  Administered 2011-12-12: 10:00:00 via INTRAVENOUS

## 2011-12-12 MED ORDER — ACETAMINOPHEN 325 MG PO TABS: 650.0000 mg | ORAL_TABLET | ORAL | Status: DC | PRN

## 2011-12-12 MED ORDER — PROMETHAZINE HCL 25 MG/ML IJ SOLN: 6.2500 mg | INTRAMUSCULAR | Status: DC | PRN

## 2011-12-12 MED ORDER — SODIUM CHLORIDE 0.9 % IJ SOLN: 3.0000 mL | INTRAMUSCULAR | Status: DC | PRN

## 2011-12-12 MED ORDER — ACETAMINOPHEN 650 MG RE SUPP: 650.0000 mg | RECTAL | Status: DC | PRN

## 2011-12-12 MED ORDER — ONDANSETRON HCL 4 MG/2ML IJ SOLN
INTRAMUSCULAR | Status: DC | PRN
  Administered 2011-12-12: 4 mg via INTRAVENOUS

## 2011-12-12 MED ORDER — FENTANYL CITRATE 0.05 MG/ML IJ SOLN: 25.0000 ug | INTRAMUSCULAR | Status: DC | PRN

## 2011-12-12 MED ORDER — KETOROLAC TROMETHAMINE 30 MG/ML IJ SOLN: 15.0000 mg | Freq: Once | INTRAMUSCULAR | Status: DC | PRN

## 2011-12-12 MED ORDER — LIDOCAINE HCL (CARDIAC) 20 MG/ML IV SOLN
INTRAVENOUS | Status: DC | PRN
  Administered 2011-12-12: 100 mg via INTRAVENOUS

## 2011-12-12 MED ORDER — ONDANSETRON HCL 4 MG/2ML IJ SOLN: 4.0000 mg | Freq: Four times a day (QID) | INTRAMUSCULAR | Status: DC | PRN

## 2011-12-12 MED ORDER — HYDROCODONE-ACETAMINOPHEN 5-500 MG PO CAPS: 1.0000 | ORAL_CAPSULE | ORAL | Status: DC | PRN

## 2011-12-12 MED ORDER — PHENAZOPYRIDINE HCL 200 MG PO TABS
ORAL | Status: DC | PRN
  Administered 2011-12-12: 11:00:00 via INTRAVESICAL

## 2011-12-12 MED ORDER — MIDAZOLAM HCL 5 MG/5ML IJ SOLN
INTRAMUSCULAR | Status: DC | PRN
  Administered 2011-12-12: 2 mg via INTRAVENOUS

## 2011-12-12 MED ORDER — CIPROFLOXACIN HCL 250 MG PO TABS: 250.0000 mg | ORAL_TABLET | Freq: Two times a day (BID) | ORAL | Status: DC

## 2011-12-12 MED ORDER — FENTANYL CITRATE 0.05 MG/ML IJ SOLN
INTRAMUSCULAR | Status: DC | PRN
  Administered 2011-12-12: 50 ug via INTRAVENOUS
  Administered 2011-12-12: 100 ug via INTRAVENOUS
  Administered 2011-12-12: 50 ug via INTRAVENOUS

## 2011-12-12 SURGICAL SUPPLY — 16 items
BAG DRAIN URO-CYSTO SKYTR STRL (DRAIN) ×2 IMPLANT
CANISTER SUCT LVC 12 LTR MEDI- (MISCELLANEOUS) ×2 IMPLANT
CLOTH BEACON ORANGE TIMEOUT ST (SAFETY) ×2 IMPLANT
DRAPE CAMERA CLOSED 9X96 (DRAPES) ×2 IMPLANT
ELECT REM PT RETURN 9FT ADLT (ELECTROSURGICAL) ×2
ELECTRODE REM PT RTRN 9FT ADLT (ELECTROSURGICAL) ×1 IMPLANT
GLOVE BIO SURGEON STRL SZ8 (GLOVE) ×2 IMPLANT
GLOVE BIOGEL M 7.0 STRL (GLOVE) ×2 IMPLANT
GLOVE ECLIPSE 7.5 STRL STRAW (GLOVE) ×2 IMPLANT
GOWN STRL REIN XL XLG (GOWN DISPOSABLE) ×2 IMPLANT
GOWN XL W/COTTON TOWEL STD (GOWNS) ×2 IMPLANT
NEEDLE HYPO 22GX1.5 SAFETY (NEEDLE) ×2 IMPLANT
NS IRRIG 500ML POUR BTL (IV SOLUTION) IMPLANT
PACK CYSTOSCOPY (CUSTOM PROCEDURE TRAY) ×2 IMPLANT
SYR 20CC LL (SYRINGE) ×2 IMPLANT
WATER STERILE IRR 3000ML UROMA (IV SOLUTION) ×2 IMPLANT

## 2011-12-12 NOTE — Anesthesia Procedure Notes (Signed)
Procedure Name: LMA Insertion Date/Time: 12/12/2011 10:26 AM Performed by: Jessica Priest Pre-anesthesia Checklist: Patient identified, Emergency Drugs available, Suction available and Patient being monitored Patient Re-evaluated:Patient Re-evaluated prior to inductionOxygen Delivery Method: Circle System Utilized Preoxygenation: Pre-oxygenation with 100% oxygen Intubation Type: IV induction Ventilation: Mask ventilation without difficulty LMA: LMA inserted LMA Size: 4.0 Number of attempts: 1 Airway Equipment and Method: bite block Placement Confirmation: positive ETCO2 Tube secured with: Tape Dental Injury: Teeth and Oropharynx as per pre-operative assessment

## 2011-12-12 NOTE — Anesthesia Postprocedure Evaluation (Signed)
  Anesthesia Post-op Note  Patient: [name] [name]  Procedure(s) Performed: Procedure(s) (LRB): CYSTOSCOPY/HYDRODISTENSION (N/A)  Patient Location: PACU  Anesthesia Type: General  Level of Consciousness: awake and alert   Airway and Oxygen Therapy: Patient Spontanous Breathing  Post-op Pain: mild  Post-op Assessment: Post-op Vital signs reviewed, Patient's Cardiovascular Status Stable, Respiratory Function Stable, Patent Airway and No signs of Nausea or vomiting  Post-op Vital Signs: stable  Complications: No apparent anesthesia complications

## 2011-12-12 NOTE — Anesthesia Preprocedure Evaluation (Signed)
Anesthesia Evaluation  Patient identified by MRN, date of birth, ID band Patient awake    Reviewed: Allergy & Precautions, H&P , NPO status , Patient's Chart, lab work & pertinent test results  Airway Mallampati: III TM Distance: <3 FB Neck ROM: Full    Dental No notable dental hx.    Pulmonary neg pulmonary ROS,  breath sounds clear to auscultation  Pulmonary exam normal       Cardiovascular hypertension, Pt. on medications Rhythm:Regular Rate:Normal     Neuro/Psych negative neurological ROS  negative psych ROS   GI/Hepatic Neg liver ROS, GERD-  Medicated,  Endo/Other  Morbid obesity  Renal/GU negative Renal ROS  negative genitourinary   Musculoskeletal negative musculoskeletal ROS (+)   Abdominal   Peds negative pediatric ROS (+)  Hematology negative hematology ROS (+)   Anesthesia Other Findings   Reproductive/Obstetrics negative OB ROS                           Anesthesia Physical Anesthesia Plan  ASA: III  Anesthesia Plan: General   Post-op Pain Management:    Induction: Intravenous  Airway Management Planned: LMA  Additional Equipment:   Intra-op Plan:   Post-operative Plan:   Informed Consent: I have reviewed the patients History and Physical, chart, labs and discussed the procedure including the risks, benefits and alternatives for the proposed anesthesia with the patient or authorized representative who has indicated his/her understanding and acceptance.   Dental advisory given  Plan Discussed with: CRNA and Surgeon  Anesthesia Plan Comments:         Anesthesia Quick Evaluation

## 2011-12-12 NOTE — H&P (Signed)
H&P  Chief Complaint: bladder pain, lower urinary tract symptoms  History of Present Illness: [name] [name] is a 56 y.o. year old female who is undergoing cystoscopy and HOD for further characterization and treatment of interstitial cystitis. She has been treated with more conservative measures, including anti-muscarinic therapy and try cyclic medications 2 she is failed these and presents for further management.  Past Medical History  Diagnosis Date  . GERD (gastroesophageal reflux disease)   . Allergy   . Osteoporosis   . Obesity     status post bariatric procedure in [facility] 2005  . Borderline hypertension     pt denies  . Elevated alkaline phosphatase level     negative workup (presumed secondary to fatty liver)  . Thyroid nodule 7/10    `  . Interstitial cystitis     Dr Normajean Baxter  . 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 Neuromuscular disorder     rt arm carpal tunnel syndrome  . Anemia     takes iron supplement  . Arthritis   . Frequency-urgency syndrome     Past Surgical History  Procedure Date  . Joint replacement 2008    left total knee  . Stomach surgery 2007    "tummy tuck"  . Gastric bypass 2005  . Eye surgery 1997    bilateral cataract extraction  . Knee surgery 1997    left knee  . Refractive surgery 2006  . Nasal sinus surgery 1989    Dr Lazarus Salines  . Cryotherapy 1980  . Colposcopy 1980  . Shoulder surgery 2 2012    RIGHT   . Foot surgery     bilateral bunionettes    Home Medications:  No prescriptions prior to admission    Allergies:  Allergies  Allergen Reactions  . Codeine     REACTION: hives  . Cortisone     REACTION: palpitations  . 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  No allergy.  . Nitrofurantoin     REACTION: fever  . Prednisone     REACTION: palpitations    Family History  Problem Relation Age of Onset  . Diabetes Other   . Hyperlipidemia Other   . Hypertension Other   . Cancer  Other     prostate  . Hypertension Mother   . Heart disease Father   . Hypertension Father   . Diabetes Father   . Cancer Father     PROSTATE  . Heart disease Maternal Grandmother   . Hypertension Maternal Grandmother   . Heart disease Maternal Grandfather   . Hypertension Maternal Grandfather     Social History:  reports that she has never smoked. She has never used smokeless tobacco. She reports that she drinks about .6 ounces of alcohol per week. She reports that she does not use illicit drugs.  ROS: Genitourinary, constitutional, skin, eye, otolaryngeal, hematologic/lymphatic, cardiovascular, pulmonary, endocrine, musculoskeletal, gastrointestinal, neurological and psychiatric system(s) were reviewed and pertinent findings if present are noted.  Genitourinary: urinary frequency, urinary urgency, dysuria, nocturia, hematuria and suprapubic pain.  Musculoskeletal: back pain.      Physical Exam:  Vital signs in last 24 hours:   General:  Alert and oriented, No acute distress HEENT: Normocephalic, atraumatic Neck: No JVD or lymphadenopathy Cardiovascular: Regular rate and rhythm Lungs: Clear bilaterally Abdomen: obese.Soft, nontender, nondistended, no abdominal masses. Back: No CVA tenderness Extremities: No edema Neurologic: Grossly intact  Laboratory Data:  No results found  for this or any previous visit (from the past 24 hour(s)). No results found for this or any previous visit (from the past 240 hour(s)). Creatinine: No results found for this basename: CREATININE:7 in the last 168 hours  Radiologic Imaging: No results found.  Impression/Assessment:  Interstitial cystitis  Plan:  Cystoscopy, HOD  [name] [name] 12/12/2011, 8:27 AM  Bertram Millard. [name] Carton MD

## 2011-12-12 NOTE — Interval H&P Note (Signed)
History and Physical Interval Note:  12/12/2011 10:06 AM  [name] [name]  has presented today for surgery, with the diagnosis of INTERSTITIAL CYSTITIS  The various methods of treatment have been discussed with the patient and family. After consideration of risks, benefits and other options for treatment, the patient has consented to  Procedure(s) (LRB) with comments: CYSTOSCOPY/HYDRODISTENSION (N/A) - 30 MIN [id number] WE[facility] as a surgical intervention .  The patient's history has been reviewed, patient examined, no change in status, stable for surgery.  I have reviewed the patient's chart and labs.  Questions were answered to the patient's satisfaction.     [name]

## 2011-12-12 NOTE — Anesthesia Postprocedure Evaluation (Deleted)
  Immediate Anesthesia Transfer of Care Note  Patient: [name] [name]  Procedure(s) Performed: Procedure(s) (LRB): CYSTOSCOPY/HYDRODISTENSION (N/A)  Patient Location: PACU  Anesthesia Type: General  Level of Consciousness: awake, sedated, patient cooperative and responds to stimulation  Airway & Oxygen Therapy: Patient Spontanous Breathing and Patient connected to face mask oxygen  Post-op Assessment: Report given to PACU RN, Post -op Vital signs reviewed and stable and Patient moving all extremities  Post vital signs: Reviewed and stable  Complications: No apparent anesthesia complications

## 2011-12-12 NOTE — Op Note (Signed)
Preoperative diagnosis: Interstitial cystitis  Postoperative diagnosis: Same   Procedure: Cystoscopy, HOD, installation of Marcaine/Pyridium    Surgeon: Bertram Millard. Kore Madlock, M.D.   Anesthesia: Gen.   Complications: None  Specimen(s): None  Drain(s): None  Indications: 56 year-old female with intractable voiding symptoms including frequency, urgency, occasional incontinence, nocturia and pelvic pain. She has failed outpatient medical therapy and presents at this time for cystoscopy and HOD. Risks and complications have been discussed with the patient. She understands these and desires to proceed.    Technique and findings: The patient was properly identified in the holding area, taken to the operating room where general anesthetic was administered with the LMA. She had received preoperative IV antibiotics. She was placed in the dorsolithotomy position. Genitalia and perineum were prepped and draped. Proper timeout was then performed.  A 22 French panendoscope was then advanced into the bladder. Systematic inspection of the bladder with both the 12 and 70 lenses were then performed. The bladder appeared slightly erythematous with some mildly prominent vessels. There were no mucosal/urothelial abnormalities. There were scattered trabeculations. No foreign bodies were noted. Ureteral orifices were normal in configuration and location. I then filled the bladder to capacity with saline through the cystoscope. The irrigation fluid was held 700 mm above the patient's bladder. The bladder was left filled for 10 minutes, then drained. The capacity was 850 cc. Following drainage of the bladder, the bladder was again circumferentially inspected. There were scattered glomerulations. There was mild bleeding from these. The bladder was then again drained, and  then 40 cc of solution of 400 mg pyridium and a half percent Marcaine was placed. The patient was awakened and taken to PACU in stable condition. She  tolerated the procedure well.

## 2011-12-13 ENCOUNTER — Encounter (HOSPITAL_BASED_OUTPATIENT_CLINIC_OR_DEPARTMENT_OTHER): Payer: Self-pay | Admitting: Urology

## 2011-12-13 ENCOUNTER — Ambulatory Visit: Payer: PRIVATE HEALTH INSURANCE | Admitting: Gastroenterology

## 2012-01-05 ENCOUNTER — Other Ambulatory Visit: Payer: Self-pay | Admitting: Obstetrics and Gynecology

## 2012-01-05 ENCOUNTER — Telehealth: Payer: Self-pay | Admitting: *Deleted

## 2012-01-05 MED ORDER — FLUCONAZOLE 100 MG PO TABS: 100.0000 mg | ORAL_TABLET | Freq: Every day | ORAL | Status: DC

## 2012-01-05 NOTE — Telephone Encounter (Signed)
Ok, Diflucan 100mg  daily for 5 days, #5.  Office visit if no relief.

## 2012-01-05 NOTE — Telephone Encounter (Signed)
(   Dr.G patient) Pt called c/o recurrent yeast infection itching,white discharge. Dr.G normally gives pt Terazol cream,but she is requesting 5 diflucan tablets if possible. Pt said she had bladder surgery 1 week ago. Okay to send Rx?

## 2012-01-05 NOTE — Telephone Encounter (Signed)
Pt informed with the below note, rx sent. 

## 2012-01-16 ENCOUNTER — Ambulatory Visit (INDEPENDENT_AMBULATORY_CARE_PROVIDER_SITE_OTHER): Payer: PRIVATE HEALTH INSURANCE | Admitting: Gastroenterology

## 2012-01-16 ENCOUNTER — Telehealth: Payer: Self-pay

## 2012-01-16 ENCOUNTER — Encounter: Payer: Self-pay | Admitting: Gastroenterology

## 2012-01-16 VITALS — BP 112/74 | HR 100 | Ht 64.75 in | Wt 230.5 lb

## 2012-01-16 DIAGNOSIS — K649 Unspecified hemorrhoids: Secondary | ICD-10-CM

## 2012-01-16 NOTE — Telephone Encounter (Signed)
Blanca to notify pt  

## 2012-01-16 NOTE — Patient Instructions (Addendum)
Referral to [facility] for options to remove bothersome external hemorrhoids. [facility] will call you with the appointment information.  Their contact information is [phone].  If you have not heard from them in a couple of weeks you can call them or our office for an update.

## 2012-01-16 NOTE — Progress Notes (Signed)
Review of pertinent gastrointestinal problems: 1. Routine risk for colon cancer. Colonoscopy 07/2006; no polyps found. Recall at 10 year interval recommended 2. Diverticulosis throughout colon on scope 2008. 3. Small hemorrhoids. Noted on c scope and have bothered her for 20-25 years.  HPI: This is a    very pleasant 56 year old woman whom I last saw about 5-1/2 years ago.  Bothered by hemorrhoids for at least 25 years.  She has tried OTC prep H periodically, suppositories and creams at times.  Really not much change in the hemorrhoids in past 13 years.  Had bariatric surgery, never constipated since then.  Hygiene is not easily but they rarely bleed now; about 1-2 times per month.    Overall weight stable in past year 220-230 pounds.      Past Medical History  Diagnosis Date  . GERD (gastroesophageal reflux disease)   . Allergy   . Osteoporosis   . Obesity     status post bariatric procedure in [facility] 2005  . Borderline hypertension     pt denies  . Elevated alkaline phosphatase level     negative workup (presumed secondary to fatty liver)  . Thyroid nodule 7/10    `  . Interstitial cystitis     Dr Normajean Baxter  . 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 Neuromuscular disorder     rt arm carpal tunnel syndrome  . Anemia     takes iron supplement  . Arthritis   . Frequency-urgency syndrome     Past Surgical History  Procedure Date  . Joint replacement 2008    left total knee  . Stomach surgery 2007    "tummy tuck"  . Gastric bypass 2005  . Eye surgery 1997    bilateral cataract extraction  . Knee surgery 1997    left knee  . Refractive surgery 2006    lasik  . Nasal sinus surgery 1989    Dr Lazarus Salines  . Cryotherapy 1980  . Colposcopy 1980  . Shoulder surgery 2 2012    RIGHT   . Foot surgery     bilateral bunionettes  . Cysto with hydrodistension 12/12/2011    Procedure: CYSTOSCOPY/HYDRODISTENSION;  Surgeon: Marcine Matar, MD;  Location: [facility];  Service: Urology;  Laterality: N/A;  30 MIN     Current Outpatient Prescriptions  Medication Sig Dispense Refill  . ALPRAZolam (XANAX) 0.5 MG tablet TAKE 1 TABLET BY MOUTH TWICE DAILY  60 tablet  3  . amitriptyline (ELAVIL) 50 MG tablet Take 50 mg by mouth at bedtime. Take 1/2 tablet by mouth at bedtime.      . Azelastine HCl (ASTEPRO) 0.15 % SOLN 1 spray by Each Nare route 2 (two) times daily.        . Bepotastine Besilate (BEPREVE) 1.5 % SOLN Apply 1 drop to eye 2 (two) times daily as needed.        . calcium elemental as carbonate (TUMS ULTRA 1000) 400 MG tablet Chew 1,000 mg by mouth 2 (two) times daily.        . cetirizine (ZYRTEC) 10 MG tablet Take 10 mg by mouth daily.        . cholecalciferol (VITAMIN D) 1000 UNITS tablet Take 4,000 Units by mouth daily. Take 2 tablets by mouth twice a day      . ferrous sulfate 325 (65 FE) MG tablet Take 1 tablet (325 mg total) by mouth 2 (two) times daily.  60 tablet  6  .  hydrocodone-acetaminophen (LORCET-HD) 5-500 MG per capsule Take 1 capsule by mouth every 4 (four) hours as needed for pain.  20 capsule  0  . mometasone (NASONEX) 50 MCG/ACT nasal spray Place 1 spray into the nose 2 (two) times daily.       . Multiple Vitamin (MULTIVITAMIN) tablet Take 1 tablet by mouth 2 (two) times daily.        . Omega-3 Fatty Acids (FISH OIL) 1000 MG CAPS Take 1 capsule by mouth 2 (two) times daily.        Marland Kitchen omeprazole (PRILOSEC) 40 MG capsule Take 40 mg by mouth 2 (two) times daily.        . Pyridoxine HCl (VITAMIN B6 PO) Take 1 tablet by mouth daily.      . sertraline (ZOLOFT) 50 MG tablet Take 1 tablet (50 mg total) by mouth daily.  30 tablet  12  . terconazole (TERAZOL 3) 0.8 % vaginal cream Place 1 applicator  vaginally at bedtime for 3 nights  20 g  2  . trospium (SANCTURA) 20 MG tablet Take 20 mg by mouth as needed.      . valACYclovir (VALTREX) 500 MG tablet Take 1 tablet (500 mg total) by mouth daily.  30 tablet  12  . vitamin A 16109 UNIT  capsule Take 10,000 Units by mouth daily.        . Meth-Hyo-M Bl-Na Phos-Ph Sal (UTA) 120 MG CAPS         Allergies as of 01/16/2012 - Review Complete 01/16/2012  Allergen Reaction Noted  . Codeine    . Cortisone    . Ibuprofen  08/06/2008  . Nitrofurantoin  05/14/2009  . Prednisone      Family History  Problem Relation Age of Onset  . Diabetes Paternal Grandfather   . Hyperlipidemia Mother   . Hypertension Mother   . Heart disease Father   . Hypertension Father   . Diabetes Father   . Prostate cancer Father   . Heart disease Maternal Grandmother   . Hypertension Maternal Grandmother   . Heart disease Maternal Grandfather   . Hypertension Maternal Grandfather   . Stroke Maternal Grandfather   . Stroke Paternal Grandfather     History   Social History  . Marital Status: Single    Spouse Name: N/A    Number of Children: 1  . Years of Education: N/A   Occupational History  . MANAGER    Social History Main Topics  . Smoking status: Never Smoker   . Smokeless tobacco: Never Used  . Alcohol Use: 0.6 oz/week    1 Glasses of wine per week     Comment: RARE  . Drug Use: No  . Sexually Active: Yes   Other Topics Concern  . Not on file   Social History Narrative  . No narrative on file      Physical Exam: BP 112/74  Pulse 100  Ht 5' 4.75" (1.645 m)  Wt 230 lb 8 oz (104.554 kg)  BMI 38.65 kg/m2 Constitutional: generally well-appearing Psychiatric: alert and oriented x3 Abdomen: soft, nontender, nondistended, no obvious ascites, no peritoneal signs, normal bowel sounds Rectal examination with female assistant in the room. Small to medium nonthrombosed external hemorrhoids, there was brown stool in the otherwise normal rectum that was Hemoccult negative.    Assessment and plan: 56 y.o. female with bothersome external hemorrhoids   she was Hemoccult negative today and had colonoscopy about 5 years ago.  I don't think that needs  to be repeated now. She is  bothered by hemorrhoids which at been a problem for her for 20-25 years. Her biggest issue is keeping her perineum clean after defecation. She has been using lites after bowel movements and still struggles with this. She denies any constipation at all. I'm going to set her up to see a surgical doctor to consider external hemorrhoid resection.

## 2012-01-16 NOTE — Telephone Encounter (Signed)
Message copied by Donata Duff on Mon Jan 16, 2012 10:00 AM ------      Message from: Marnette Burgess      Created: Mon Jan 16, 2012  9:52 AM       Patient scheduled to see Dr. Manus Rudd on 01/23/12 @ 2:30pm, arrive @ 2:00.  If you have any questions please call [phone].            Thank You,      Elane Fritz      ----- Message -----         From: Donata Duff, CMA         Sent: 01/16/2012   9:04 AM           To: Marnette Burgess            Needs to discuss surgery option for external hemorrhoids

## 2012-01-23 ENCOUNTER — Ambulatory Visit (INDEPENDENT_AMBULATORY_CARE_PROVIDER_SITE_OTHER): Payer: PRIVATE HEALTH INSURANCE | Admitting: Surgery

## 2012-02-12 ENCOUNTER — Other Ambulatory Visit: Payer: Self-pay | Admitting: Obstetrics and Gynecology

## 2012-02-13 ENCOUNTER — Telehealth: Payer: Self-pay | Admitting: Internal Medicine

## 2012-02-13 NOTE — Telephone Encounter (Signed)
Left detailed message on pharmacy voicemail to send request closer to refill due date as fax indicated that rx was just filled on 02/12/12.

## 2012-02-13 NOTE — Telephone Encounter (Signed)
Refill- alprazolam 0.5mg tablets. Take one tablet by mouth twice daily. Qty 60 last fill 12.29.13 °

## 2012-02-15 HISTORY — PX: VARICOSE VEIN SURGERY: SHX832

## 2012-02-17 ENCOUNTER — Ambulatory Visit (INDEPENDENT_AMBULATORY_CARE_PROVIDER_SITE_OTHER): Payer: PRIVATE HEALTH INSURANCE | Admitting: Internal Medicine

## 2012-02-17 ENCOUNTER — Encounter: Payer: Self-pay | Admitting: Internal Medicine

## 2012-02-17 VITALS — BP 108/78 | HR 71 | Temp 98.1°F | Resp 16 | Wt 219.5 lb

## 2012-02-17 DIAGNOSIS — J329 Chronic sinusitis, unspecified: Secondary | ICD-10-CM | POA: Insufficient documentation

## 2012-02-17 MED ORDER — HYDROCOD POLST-CHLORPHEN POLST 10-8 MG/5ML PO LQCR: 5.0000 mL | Freq: Two times a day (BID) | ORAL | Status: DC | PRN

## 2012-02-17 MED ORDER — AMOXICILLIN-POT CLAVULANATE 875-125 MG PO TABS: 1.0000 | ORAL_TABLET | Freq: Two times a day (BID) | ORAL | Status: AC

## 2012-02-17 NOTE — Progress Notes (Signed)
Subjective:    Patient ID: [name] [name], female    DOB: 04/15/1955, 57 y.o.   MRN: [id number]  HPI Pt presents to clinic for evaluation of cough. Notes 1.5 week h/o NP cough as well as nasal congestion and bilateral maxillary sinus pressure. Initial fever now resolved. Taking otc cough medication without improvement. +sick exposure.  Past Medical History  Diagnosis Date  . GERD (gastroesophageal reflux disease)   . Allergy   . Osteoporosis   . Obesity     status post bariatric procedure in [facility] 2005  . Borderline hypertension     pt denies  . Elevated alkaline phosphatase level     negative workup (presumed secondary to fatty liver)  . Thyroid nodule 7/10    `  . Interstitial cystitis     Dr Normajean Baxter  . 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 Neuromuscular disorder     rt arm carpal tunnel syndrome  . Anemia     takes iron supplement  . Arthritis   . Frequency-urgency syndrome   . Anxiety    Past Surgical History  Procedure Date  . Joint replacement 2008    left total knee  . Stomach surgery 2007    "tummy tuck"  . Gastric bypass 2005  . Eye surgery 1997    bilateral cataract extraction  . Knee surgery 1997    left knee  . Refractive surgery 2006    lasik  . Nasal sinus surgery 1989    Dr Lazarus Salines  . Cryotherapy 1980  . Colposcopy 1980  . Shoulder surgery 2 2012    RIGHT   . Foot surgery     bilateral bunionettes  . Cysto with hydrodistension 12/12/2011    Procedure: CYSTOSCOPY/HYDRODISTENSION;  Surgeon: Marcine Matar, MD;  Location: [facility];  Service: Urology;  Laterality: N/A;  30 MIN     reports that she has never smoked. She has never used smokeless tobacco. She reports that she drinks about .6 ounces of alcohol per week. She reports that she does not use illicit drugs. family history includes Diabetes in her father and paternal grandfather; Heart disease in her father, maternal grandfather, and maternal grandmother; Hyperlipidemia  in her mother; Hypertension in her father, maternal grandfather, maternal grandmother, and mother; Prostate cancer in her father; and Stroke in her maternal grandfather and paternal grandfather. Allergies  Allergen Reactions  . Codeine     REACTION: hives  . Cortisone     REACTION: palpitations  . 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  No allergy.  . Nitrofurantoin     REACTION: fever  . Prednisone     REACTION: palpitations     Review of Systems see hpi     Objective:   Physical Exam  Nursing note and vitals reviewed. Constitutional: She appears well-developed and well-nourished. No distress.  HENT:  Head: Normocephalic and atraumatic.  Right Ear: External ear normal.  Left Ear: External ear normal.  Nose: Nose normal.  Mouth/Throat: Oropharynx is clear and moist. No oropharyngeal exudate.  Eyes: Conjunctivae normal and EOM are normal. No scleral icterus.       + mild bilateral sinus tenderness  Neck: Neck supple.  Pulmonary/Chest: Effort normal and breath sounds normal. No respiratory distress. She has no wheezes. She has no rales.  Neurological: She is alert.  Skin: Skin is warm and dry. She is not diaphoretic.  Psychiatric: She has a normal mood and  affect.          Assessment & Plan:

## 2012-02-17 NOTE — Assessment & Plan Note (Signed)
Begin augmentin x 7days. Attempt tussionex prn cough-cautioned re possible sedating effect. Followup if no improvement or worsening.

## 2012-02-24 ENCOUNTER — Ambulatory Visit (INDEPENDENT_AMBULATORY_CARE_PROVIDER_SITE_OTHER): Payer: No Typology Code available for payment source | Admitting: General Surgery

## 2012-02-24 ENCOUNTER — Encounter (INDEPENDENT_AMBULATORY_CARE_PROVIDER_SITE_OTHER): Payer: Self-pay | Admitting: General Surgery

## 2012-02-24 VITALS — BP 120/82 | HR 72 | Temp 97.2°F | Resp 16 | Ht 65.0 in | Wt 218.4 lb

## 2012-02-24 DIAGNOSIS — K649 Unspecified hemorrhoids: Secondary | ICD-10-CM

## 2012-02-24 NOTE — Patient Instructions (Signed)

## 2012-02-24 NOTE — Progress Notes (Signed)
Chief Complaint  Patient presents with  . Hemorrhoids    HISTORY: [name] [name] is a 57 y.o. female who presents to the office with rectal pain.  Other symptoms include occasional bleeding and inability to keep clean.  This had been occurring for ~55yr.  She has tried prep H, suppositories and topical ointments in the past with some success.  Hard stools makes the symptoms worse but she doesn't have these anymore.  Diarrhea also makes them worse.   It is intermittent in nature.  Her bowel habits are regular and her bowel movements are soft with some diarrhea.  Her fiber intake is good.  Her last colonoscopy was in 2008 by Dr Christella Hartigan and she is due in 2018.  She has had prolapsing tissue in the past.     Past Medical History  Diagnosis Date  . GERD (gastroesophageal reflux disease)   . Allergy   . Osteoporosis   . Obesity     status post bariatric procedure in [facility] 2005  . Borderline hypertension     pt denies  . Elevated alkaline phosphatase level     negative workup (presumed secondary to fatty liver)  . Thyroid nodule 7/10    `  . Interstitial cystitis     Dr Normajean Baxter  . 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 Neuromuscular disorder     rt arm carpal tunnel syndrome  . Anemia     takes iron supplement  . Arthritis   . Frequency-urgency syndrome   . Anxiety       Past Surgical History  Procedure Date  . Joint replacement 2008    left total knee  . Stomach surgery 2007    "tummy tuck"  . Gastric bypass 2005  . Eye surgery 1997    bilateral cataract extraction  . Knee surgery 1997    left knee  . Refractive surgery 2006    lasik  . Nasal sinus surgery 1989    Dr Lazarus Salines  . Cryotherapy 1980  . Colposcopy 1980  . Shoulder surgery 2 2012    RIGHT   . Foot surgery     bilateral bunionettes  . Cysto with hydrodistension 12/12/2011    Procedure: CYSTOSCOPY/HYDRODISTENSION;  Surgeon: Marcine Matar, MD;  Location: [facility];  Service: Urology;   Laterality: N/A;  30 MIN         Current Outpatient Prescriptions  Medication Sig Dispense Refill  . ALPRAZolam (XANAX) 0.5 MG tablet TAKE 1 TABLET BY MOUTH TWICE DAILY  60 tablet  3  . amitriptyline (ELAVIL) 50 MG tablet Take 50 mg by mouth at bedtime. Take 1/2 tablet by mouth at bedtime.      . Azelastine HCl (ASTEPRO) 0.15 % SOLN 1 spray by Each Nare route 2 (two) times daily.        . Bepotastine Besilate (BEPREVE) 1.5 % SOLN Apply 1 drop to eye 2 (two) times daily as needed.        . calcium elemental as carbonate (TUMS ULTRA 1000) 400 MG tablet Chew 1,000 mg by mouth 2 (two) times daily.        . cetirizine (ZYRTEC) 10 MG tablet Take 10 mg by mouth daily.        . chlorpheniramine-HYDROcodone (TUSSIONEX PENNKINETIC ER) 10-8 MG/5ML LQCR Take 5 mLs by mouth every 12 (twelve) hours as needed.  140 mL  0  . cholecalciferol (VITAMIN D) 1000 UNITS tablet Take 4,000 Units by mouth daily. Take 2  tablets by mouth twice a day      . ferrous sulfate 325 (65 FE) MG tablet Take 1 tablet (325 mg total) by mouth 2 (two) times daily.  60 tablet  6  . mometasone (NASONEX) 50 MCG/ACT nasal spray Place 1 spray into the nose 2 (two) times daily.       . Multiple Vitamin (MULTIVITAMIN) tablet Take 1 tablet by mouth 2 (two) times daily.        . Omega-3 Fatty Acids (FISH OIL) 1000 MG CAPS Take 1 capsule by mouth 2 (two) times daily.        Marland Kitchen omeprazole (PRILOSEC) 40 MG capsule Take 40 mg by mouth 2 (two) times daily.        . Pyridoxine HCl (VITAMIN B6 PO) Take 1 tablet by mouth daily.      . sertraline (ZOLOFT) 50 MG tablet Take 1 tablet (50 mg total) by mouth daily.  30 tablet  12  . valACYclovir (VALTREX) 500 MG tablet Take 1 tablet (500 mg total) by mouth daily.  30 tablet  12  . vitamin A 65784 UNIT capsule Take 10,000 Units by mouth daily.        Marland Kitchen amoxicillin-clavulanate (AUGMENTIN) 875-125 MG per tablet Take 1 tablet by mouth 2 (two) times daily.  14 tablet  0  . fluconazole (DIFLUCAN) 150 MG tablet  TAKE 1 TABLET BY MOUTH DAILY  7 tablet  1  . hydrocodone-acetaminophen (LORCET-HD) 5-500 MG per capsule Take 1 capsule by mouth every 4 (four) hours as needed for pain.  20 capsule  0  . Meth-Hyo-M Bl-Na Phos-Ph Sal (UTA) 120 MG CAPS       . terconazole (TERAZOL 3) 0.8 % vaginal cream Place 1 applicator  vaginally at bedtime for 3 nights  20 g  2      Allergies  Allergen Reactions  . Codeine     REACTION: hives  . Cortisone     REACTION: palpitations  . 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  No allergy.  . Nitrofurantoin     REACTION: fever  . Prednisone     REACTION: palpitations      Family History  Problem Relation Age of Onset  . Diabetes Paternal Grandfather   . Hyperlipidemia Mother   . Hypertension Mother   . Heart disease Father   . Hypertension Father   . Diabetes Father   . Prostate cancer Father   . Heart disease Maternal Grandmother   . Hypertension Maternal Grandmother   . Heart disease Maternal Grandfather   . Hypertension Maternal Grandfather   . Stroke Maternal Grandfather   . Stroke Paternal Grandfather     History   Social History  . Marital Status: Single    Spouse Name: N/A    Number of Children: 1  . Years of Education: N/A   Occupational History  . MANAGER    Social History Main Topics  . Smoking status: Never Smoker   . Smokeless tobacco: Never Used  . Alcohol Use: 0.6 oz/week    1 Glasses of wine per week     Comment: RARE  . Drug Use: No  . Sexually Active: Yes   Other Topics Concern  . None   Social History Narrative  . None      REVIEW OF SYSTEMS - PERTINENT POSITIVES ONLY: Review of Systems - General ROS: negative for - chills, fever or weight loss Hematological and Lymphatic ROS:  negative for - bleeding problems, blood clots or bruising Respiratory ROS: no cough, shortness of breath, or wheezing Cardiovascular ROS: no chest pain or dyspnea on exertion Gastrointestinal ROS: positive  for - diarrhea negative for - abdominal pain, appetite loss or blood in stools Genito-Urinary ROS: no dysuria, trouble voiding, or hematuria  EXAM: Filed Vitals:   02/24/12 1600  BP: 120/82  Pulse: 72  Temp: 97.2 F (36.2 C)  Resp: 16    General appearance: alert and cooperative Resp: clear to auscultation bilaterally Cardio: regular rate and rhythm GI: normal findings: soft, non-tender   Procedure: Anoscopy Surgeon: Maisie Fus Diagnosis: Hemorrhoids  Assistant: Christella Scheuermann After the risks and benefits were explained, verbal consent was obtained for above procedure  Anesthesia: none Findings: minimal internal hemorrhoids, mild external hemorrhoids, no bleeding noted    ASSESSMENT AND PLAN:  [name] [name] is a 58 y.o. F who was referred to me for hemorrhoids.  These are very mild appearing on physical exam.  She does have some excessive skin on her buttocks that may be the source of her perception that she cannot get clean when wiping.  I think she should continue her current bowel regimen and high fiber diet.  She will return to the office if she has any worsening symptoms.     Vanita Panda, MD Colon and Rectal Surgery / General Surgery [facility]      Visit Diagnoses: No diagnosis found.  Primary Care Physician: Letitia Libra, Ala Dach, MD

## 2012-03-05 LAB — LIPID PANEL
Cholesterol: 160 mg/dL (ref 0–200)
HDL: 44 mg/dL (ref >39)
Total CHOL/HDL Ratio: 3.6 Ratio
VLDL: 19 mg/dL (ref 0–40)

## 2012-03-05 LAB — CBC WITH DIFFERENTIAL/PLATELET
Basophils Absolute: 0.1 10*3/uL (ref 0.0–0.1)
Basophils Relative: 1 % (ref 0–1)
Eosinophils Absolute: 0.5 10*3/uL (ref 0.0–0.7)
Eosinophils Relative: 7 % — ABNORMAL HIGH (ref 0–5)
MCH: 28.2 pg (ref 26.0–34.0)
MCV: 86.1 fL (ref 78.0–100.0)
Neutrophils Relative %: 49 % (ref 43–77)
Platelets: 314 10*3/uL (ref 150–400)
RBC: 4.47 MIL/uL (ref 3.87–5.11)
RDW: 15 % (ref 11.5–15.5)

## 2012-03-05 LAB — BASIC METABOLIC PANEL
CO2: 27 mEq/L (ref 19–32)
Calcium: 9.1 mg/dL (ref 8.4–10.5)
Creat: 0.45 mg/dL — ABNORMAL LOW (ref 0.50–1.10)
Sodium: 140 mEq/L (ref 135–145)

## 2012-03-05 LAB — TSH: TSH: 0.545 u[IU]/mL (ref 0.350–4.500)

## 2012-03-05 NOTE — Telephone Encounter (Signed)
Lab orders released/SLS 

## 2012-03-05 NOTE — Addendum Note (Signed)
Addended by: Regis Bill on: 03/05/2012 12:27 PM   Modules accepted: Orders

## 2012-03-07 ENCOUNTER — Ambulatory Visit (INDEPENDENT_AMBULATORY_CARE_PROVIDER_SITE_OTHER): Payer: PRIVATE HEALTH INSURANCE | Admitting: Family

## 2012-03-07 ENCOUNTER — Encounter: Payer: Self-pay | Admitting: Family

## 2012-03-07 ENCOUNTER — Ambulatory Visit (HOSPITAL_BASED_OUTPATIENT_CLINIC_OR_DEPARTMENT_OTHER)
Admission: RE | Admit: 2012-03-07 | Discharge: 2012-03-07 | Disposition: A | Discharge status: Home / Self Care | Payer: PRIVATE HEALTH INSURANCE | Source: Ambulatory Visit | Attending: Family | Admitting: Family

## 2012-03-07 ENCOUNTER — Ambulatory Visit: Payer: PRIVATE HEALTH INSURANCE | Admitting: Internal Medicine

## 2012-03-07 VITALS — BP 116/78 | HR 70 | Temp 98.5°F | Resp 16 | Ht 65.0 in | Wt 216.0 lb

## 2012-03-07 DIAGNOSIS — Z01818 Encounter for other preprocedural examination: Secondary | ICD-10-CM

## 2012-03-07 DIAGNOSIS — N760 Acute vaginitis: Secondary | ICD-10-CM | POA: Insufficient documentation

## 2012-03-07 DIAGNOSIS — I4510 Unspecified right bundle-branch block: Secondary | ICD-10-CM

## 2012-03-07 DIAGNOSIS — J309 Allergic rhinitis, unspecified: Secondary | ICD-10-CM

## 2012-03-07 LAB — PROTIME-INR
INR: 1.03 (ref <1.50)
Prothrombin Time: 13.5 seconds (ref 11.6–15.2)

## 2012-03-07 LAB — APTT: aPTT: 32 seconds (ref 24–37)

## 2012-03-07 NOTE — Assessment & Plan Note (Signed)
This is not new- reviewed old EKGs.  However, due to upcoming surgery, will refer to cardiology for cardiac clearance.

## 2012-03-07 NOTE — Addendum Note (Signed)
Addended by: Mervin Kung A on: 03/07/2012 12:02 PM   Modules accepted: Orders

## 2012-03-07 NOTE — Assessment & Plan Note (Signed)
Stable- continue astelin, nasonex, zyrtec.

## 2012-03-07 NOTE — Patient Instructions (Addendum)
Please complete your blood work prior to leaving.  Complete your chest x ray on the first floor.  You will be contacted about your referral to cardiology.  Please let us know if you have not heard back within 1 week about your referral. Please schedule a follow up appointment in 3 months.

## 2012-03-07 NOTE — Progress Notes (Signed)
Subjective:    Patient ID: [name] [name], female    DOB: 10-Mar-1955, 57 y.o.   MRN: [id number]  HPI  [name] is a 57 yr old female who presents today for medical clearance for her upcoming surgery. 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  She is seeing with Dr. Venita Lick who is planning ACOF C4-5 which has not yet been scheduled.   Nasal drainage- she report clear nasal drainage, "think its allergies." Hx Anemia- follow up CBC normal.  ?Vaginal yeast infection- reports that she tried diflucan which did not help. Reports fishy odor.   Insomnia- using melatonin and xanax, but still waking up several times a night- generally falls back to sleep in 10 minutes.   Review of Systems    see HPI  Past Medical History  Diagnosis Date  . GERD (gastroesophageal reflux disease)   . Allergy   . Osteoporosis   . Obesity     status post bariatric procedure in [facility] 2005  . Borderline hypertension     pt denies  . Elevated alkaline phosphatase level     negative workup (presumed secondary to fatty liver)  . Thyroid nodule 7/10    `  . Interstitial cystitis     Dr Normajean Baxter  . 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 Neuromuscular disorder     rt arm carpal tunnel syndrome  . Anemia     takes iron supplement  . Arthritis   . Frequency-urgency syndrome   . Anxiety     History   Social History  . Marital Status: Single    Spouse Name: N/A    Number of Children: 1  . Years of Education: N/A   Occupational History  . MANAGER    Social History Main Topics  . Smoking status: Never Smoker   . Smokeless tobacco: Never Used  . Alcohol Use: 0.6 oz/week    1 Glasses of wine per week     Comment: RARE  . Drug Use: No  . Sexually Active: Yes   Other Topics Concern  . Not on file   Social History Narrative  . No narrative on file    Past Surgical History  Procedure Date  . Joint replacement 2008    left total knee  . Stomach surgery 2007    "tummy tuck"  .  Gastric bypass 2005  . Eye surgery 1997    bilateral cataract extraction  . Knee surgery 1997    left knee  . Refractive surgery 2006    lasik  . Nasal sinus surgery 1989    Dr Lazarus Salines  . Cryotherapy 1980  . Colposcopy 1980  . Shoulder surgery 2 2012    RIGHT   . Foot surgery     bilateral bunionettes  . Cysto with hydrodistension 12/12/2011    Procedure: CYSTOSCOPY/HYDRODISTENSION;  Surgeon: Marcine Matar, MD;  Location: [facility];  Service: Urology;  Laterality: N/A;  30 MIN   . Bladder surgery 02/21/12 and 02/28/12    Dr Retta Diones    Family History  Problem Relation Age of Onset  . Diabetes Paternal Grandfather   . Hyperlipidemia Mother   . Hypertension Mother   . Heart disease Father   . Hypertension Father   . Diabetes Father   . Prostate cancer Father   . Heart disease Maternal Grandmother   . Hypertension Maternal Grandmother   . Heart disease Maternal Grandfather   . Hypertension Maternal Grandfather   .  Stroke Maternal Grandfather   . Stroke Paternal Grandfather     Allergies  Allergen Reactions  . Codeine     REACTION: hives  . Cortisone     REACTION: palpitations  . 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  No allergy.  . Nitrofurantoin     REACTION: fever  . Prednisone     REACTION: palpitations    Current Outpatient Prescriptions on File Prior to Visit  Medication Sig Dispense Refill  . ALPRAZolam (XANAX) 0.5 MG tablet TAKE 1 TABLET BY MOUTH TWICE DAILY  60 tablet  3  . Azelastine HCl (ASTEPRO) 0.15 % SOLN 1 spray by Each Nare route 2 (two) times daily.        . Bepotastine Besilate (BEPREVE) 1.5 % SOLN Apply 1 drop to eye 2 (two) times daily as needed.        . calcium elemental as carbonate (TUMS ULTRA 1000) 400 MG tablet Chew 1,000 mg by mouth 2 (two) times daily.        . cetirizine (ZYRTEC) 10 MG tablet Take 10 mg by mouth daily.        . chlorpheniramine-HYDROcodone (TUSSIONEX  PENNKINETIC ER) 10-8 MG/5ML LQCR Take 5 mLs by mouth every 12 (twelve) hours as needed.  140 mL  0  . cholecalciferol (VITAMIN D) 1000 UNITS tablet Take 4,000 Units by mouth daily. Take 2 tablets by mouth twice a day      . ferrous sulfate 325 (65 FE) MG tablet Take 1 tablet (325 mg total) by mouth 2 (two) times daily.  60 tablet  6  . fluconazole (DIFLUCAN) 150 MG tablet TAKE 1 TABLET BY MOUTH DAILY  7 tablet  1  . hydrocodone-acetaminophen (LORCET-HD) 5-500 MG per capsule Take 1 capsule by mouth every 4 (four) hours as needed for pain.  20 capsule  0  . mometasone (NASONEX) 50 MCG/ACT nasal spray Place 1 spray into the nose 2 (two) times daily.       . Multiple Vitamin (MULTIVITAMIN) tablet Take 1 tablet by mouth 2 (two) times daily.        Marland Kitchen MYRBETRIQ 50 MG TB24 Take 50 mg by mouth daily.      . Omega-3 Fatty Acids (FISH OIL) 1000 MG CAPS Take 1 capsule by mouth 2 (two) times daily.        Marland Kitchen omeprazole (PRILOSEC) 40 MG capsule Take 40 mg by mouth 2 (two) times daily.        . Pyridoxine HCl (VITAMIN B6 PO) Take 1 tablet by mouth daily.      . sertraline (ZOLOFT) 50 MG tablet Take 1 tablet (50 mg total) by mouth daily.  30 tablet  12  . terconazole (TERAZOL 3) 0.8 % vaginal cream Place 1 applicator  vaginally at bedtime for 3 nights  20 g  2  . valACYclovir (VALTREX) 500 MG tablet Take 1 tablet (500 mg total) by mouth daily.  30 tablet  12  . vitamin A 56213 UNIT capsule Take 10,000 Units by mouth daily.          BP 116/78  Pulse 70  Temp 98.5 F (36.9 C) (Oral)  Resp 16  Ht 5\' 5"  (1.651 m)  Wt 216 lb (97.977 kg)  BMI 35.94 kg/m2  SpO2 99%    Objective:   Physical Exam  Constitutional: She is oriented to person, place, and time. She appears well-developed and well-nourished. No distress.  HENT:  Head: Normocephalic  and atraumatic.  Mouth/Throat: No oropharyngeal exudate.  Cardiovascular: Normal rate and regular rhythm.   No murmur heard. Pulmonary/Chest: Effort normal and  breath sounds normal. No respiratory distress. She has no wheezes. She has no rales. She exhibits no tenderness.  Musculoskeletal: She exhibits no edema.  Lymphadenopathy:    She has no cervical adenopathy.  Neurological: She is alert and oriented to person, place, and time.  Skin: Skin is warm and dry.  Psychiatric: She has a normal mood and affect. Her behavior is normal. Judgment and thought content normal.          Assessment & [name]:  Pre-operative eval- will obtain PT/PTT, UA with reflex, CXR.  EKG- performed notes RBBB.

## 2012-03-07 NOTE — Assessment & Plan Note (Signed)
Suspect BV, wet prep obtained, await results.

## 2012-03-08 ENCOUNTER — Other Ambulatory Visit: Payer: Self-pay | Admitting: Family

## 2012-03-08 LAB — URINALYSIS, MICROSCOPIC ONLY
Bacteria, UA: NONE SEEN
Casts: NONE SEEN

## 2012-03-08 LAB — URINALYSIS, ROUTINE W REFLEX MICROSCOPIC
Bilirubin Urine: NEGATIVE
Hgb urine dipstick: NEGATIVE
Ketones, ur: NEGATIVE mg/dL
Specific Gravity, Urine: 1.01 (ref 1.005–1.030)
Urobilinogen, UA: 0.2 mg/dL (ref 0.0–1.0)

## 2012-03-08 LAB — VITAMIN D 25 HYDROXY (VIT D DEFICIENCY, FRACTURES): Vit D, 25-Hydroxy: 40 ng/mL (ref 30–89)

## 2012-03-08 LAB — WET PREP BY MOLECULAR PROBE: Trichomonas vaginosis: NEGATIVE

## 2012-03-08 MED ORDER — CIPROFLOXACIN HCL 500 MG PO TABS: 500.0000 mg | ORAL_TABLET | Freq: Two times a day (BID) | ORAL | Status: DC

## 2012-03-09 ENCOUNTER — Telehealth: Payer: Self-pay

## 2012-03-09 MED ORDER — TERCONAZOLE 0.8 % VA CREA: TOPICAL_CREAM | VAGINAL | Status: DC

## 2012-03-09 MED ORDER — FLUCONAZOLE 150 MG PO TABS: ORAL_TABLET | ORAL | Status: DC

## 2012-03-09 NOTE — Telephone Encounter (Signed)
Probably best to try Diflucan 150mg   today repeat in 3 days, call in #2 with refill.

## 2012-03-09 NOTE — Telephone Encounter (Signed)
Patient informed. She also asked if perhaps she could have Terazol 3 Cream.  Dr. Reece Agar had given her standing Rx at her last CE so I went ahead and e-scribed that at same time. She said Monistat helping but Terazol always better for her.   Rx's e-scribed.

## 2012-03-09 NOTE — Telephone Encounter (Signed)
Patient called stating she is a 30 year patient of Dr. Timoteo Expose and is aware he has retired. She states she plans to make [place] her provider and wanted me to check with her regarding her situation.  She states that she has recurrent yeast infections and I do see that in her 07/2011 [facility] note.  Dr. Reece Agar gives her Terazol cream as she needs it.  She said that she took Augmentin x 10 days recently and yeast inf followed. She is using OTC Monistat and it is helping. However, she went back to see "allergy doctor" today regarding persistant sinus infection and he put her on Augmentin 875mg  bid x 14 days.  She anticipates that this will create even more yeast and she is wondering if there is something she can take/do to prevent this from happening.  She said she is using probiotic and eating yogurt.

## 2012-03-19 ENCOUNTER — Telehealth: Payer: Self-pay | Admitting: Internal Medicine

## 2012-03-19 NOTE — Telephone Encounter (Signed)
Refill- alprazolam 0.5mg  tablets. Take one tablet by mouth twice daily. Qty 60 last fill 12.29.13

## 2012-03-21 NOTE — Telephone Encounter (Signed)
Quick Note:  Patient Informed and voiced understanding ______ 

## 2012-03-23 ENCOUNTER — Telehealth: Payer: Self-pay | Admitting: *Deleted

## 2012-03-23 MED ORDER — ALPRAZOLAM 0.5 MG PO TABS: 0.5000 mg | ORAL_TABLET | Freq: Two times a day (BID) | ORAL | Status: DC

## 2012-03-23 NOTE — Telephone Encounter (Signed)
Pt left message stating pharmacy has requested refill on her alprazolam previously. Do not see record of previous request in our system. Rx called to [facility] voicemail and left detailed message on pt's cell# re: completion and to call if any questions.

## 2012-03-27 ENCOUNTER — Telehealth: Payer: Self-pay | Admitting: Internal Medicine

## 2012-03-27 MED ORDER — FUROSEMIDE 20 MG PO TABS: 20.0000 mg | ORAL_TABLET | ORAL | Status: DC

## 2012-03-27 NOTE — Telephone Encounter (Signed)
Refill- furosemide 20mg  tablets. Take 1-2 tablets by mouth daily. Qty 60 last fill 1.17.14

## 2012-03-28 ENCOUNTER — Ambulatory Visit (INDEPENDENT_AMBULATORY_CARE_PROVIDER_SITE_OTHER): Payer: PRIVATE HEALTH INSURANCE | Admitting: Cardiology

## 2012-03-28 ENCOUNTER — Encounter: Payer: Self-pay | Admitting: Cardiology

## 2012-03-28 VITALS — BP 120/80 | HR 74 | Wt 220.0 lb

## 2012-03-28 DIAGNOSIS — Z01810 Encounter for preprocedural cardiovascular examination: Secondary | ICD-10-CM

## 2012-03-28 DIAGNOSIS — I10 Essential (primary) hypertension: Secondary | ICD-10-CM

## 2012-03-28 DIAGNOSIS — I4510 Unspecified right bundle-branch block: Secondary | ICD-10-CM

## 2012-03-28 NOTE — Assessment & Plan Note (Signed)
Blood pressure is normal at present.

## 2012-03-28 NOTE — Assessment & Plan Note (Signed)
Noted on previous electrocardiograms. No further evaluation indicated.

## 2012-03-28 NOTE — Assessment & Plan Note (Addendum)
Patient has essentially no cardiac risk factors. Her right bundle branch block was noted on previous electrocardiograms. She had an echocardiogram in 2008 that was normal and apparently a nuclear study that was also normal. We will obtain those records. She does not have exertional chest pain. I do not think further cardiac workup is indicated preoperatively assuming previous nuclear study negative.  Previous records obtained. Myoview on 03/30/2006 showed an ejection fraction of 61% and no ischemia. Patient may proceed with surgery without further cardiac evaluation.

## 2012-03-28 NOTE — Progress Notes (Signed)
HPI: 57 year old female for preoperative evaluation prior to right shoulder surgery. Patient noted to have a right bundle branch block in 2008. She had a preoperative evaluation at that time. Echocardiogram in February 2008 showed normal LV function. She also had a nuclear study. I do not have that report available but the patient states it was normal. She has dyspnea with more extreme activities but not routine activities. No orthopnea or PND. She has had problems with pedal edema and had recent surgery for varicose veins. She denies chest pain. She can walk her dog 30 minutes with no symptoms. She has occasional palpitations with anxiety but otherwise has not had syncope.  Current Outpatient Prescriptions  Medication Sig Dispense Refill  . ALPRAZolam (XANAX) 0.5 MG tablet Take 1 tablet (0.5 mg total) by mouth 2 (two) times daily.  60 tablet  0  . Azelastine HCl (ASTEPRO) 0.15 % SOLN 1 spray by Each Nare route 2 (two) times daily.        . Bepotastine Besilate (BEPREVE) 1.5 % SOLN Apply 1 drop to eye 2 (two) times daily as needed.        . calcium elemental as carbonate (TUMS ULTRA 1000) 400 MG tablet Chew 1,000 mg by mouth 2 (two) times daily.        . cetirizine (ZYRTEC) 10 MG tablet Take 10 mg by mouth daily.        . chlorpheniramine-HYDROcodone (TUSSIONEX PENNKINETIC ER) 10-8 MG/5ML LQCR Take 5 mLs by mouth every 12 (twelve) hours as needed.  140 mL  0  . cholecalciferol (VITAMIN D) 1000 UNITS tablet Take 4,000 Units by mouth daily. Take 2 tablets by mouth twice a day      . ferrous sulfate 325 (65 FE) MG tablet Take 1 tablet (325 mg total) by mouth 2 (two) times daily.  60 tablet  6  . furosemide (LASIX) 20 MG tablet Take 1 tablet (20 mg total) by mouth as directed. Take 1-2 tab daily  60 tablet  4  . hydrocodone-acetaminophen (LORCET-HD) 5-500 MG per capsule Take 1 capsule by mouth every 4 (four) hours as needed for pain.  20 capsule  0  . mometasone (NASONEX) 50 MCG/ACT nasal spray Place 1  spray into the nose 2 (two) times daily.       . Multiple Vitamin (MULTIVITAMIN) tablet Take 1 tablet by mouth 2 (two) times daily.        Marland Kitchen MYRBETRIQ 50 MG TB24 Take 50 mg by mouth daily.      . Omega-3 Fatty Acids (FISH OIL) 1000 MG CAPS Take 1 capsule by mouth 2 (two) times daily.        Marland Kitchen omeprazole (PRILOSEC) 40 MG capsule Take 40 mg by mouth 2 (two) times daily.        . Pyridoxine HCl (VITAMIN B6 PO) Take 1 tablet by mouth daily.      . sertraline (ZOLOFT) 50 MG tablet Take 1 tablet (50 mg total) by mouth daily.  30 tablet  12  . terconazole (TERAZOL 3) 0.8 % vaginal cream Place 1 applicator  vaginally at bedtime for 3 nights  20 g  2  . valACYclovir (VALTREX) 500 MG tablet Take 1 tablet (500 mg total) by mouth daily.  30 tablet  12  . vitamin A 04540 UNIT capsule Take 10,000 Units by mouth daily.         No current facility-administered medications for this visit.    Allergies  Allergen Reactions  . Codeine  REACTION: hives  . Cortisone     REACTION: palpitations  . 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  No allergy.  . Nitrofurantoin     REACTION: fever  . Prednisone     REACTION: palpitations    Past Medical History  Diagnosis Date  . GERD (gastroesophageal reflux disease)   . Allergy   . Osteoporosis   . Obesity     status post bariatric procedure in [facility] 2005  . Elevated alkaline phosphatase level     negative workup (presumed secondary to fatty liver)  . Thyroid nodule 7/10    `  . Interstitial cystitis     Dr Normajean Baxter  . 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 Neuromuscular disorder     rt arm carpal tunnel syndrome  . Anemia     takes iron supplement  . Arthritis   . Frequency-urgency syndrome   . Anxiety     Past Surgical History  Procedure Laterality Date  . Joint replacement  2008    left total knee  . Stomach surgery  2007    "tummy tuck"  . Gastric bypass  2005  . Eye surgery  1997    bilateral cataract  extraction  . Knee surgery  1997    left knee  . Refractive surgery  2006    lasik  . Nasal sinus surgery  1989    Dr Lazarus Salines  . Cryotherapy  1980  . Colposcopy  1980  . Shoulder surgery  2 2012    RIGHT   . Foot surgery      bilateral bunionettes  . Cysto with hydrodistension  12/12/2011    Procedure: CYSTOSCOPY/HYDRODISTENSION;  Surgeon: Marcine Matar, MD;  Location: [facility];  Service: Urology;  Laterality: N/A;  30 MIN   . Bladder surgery  02/21/12 and 02/28/12    Dr Retta Diones    History   Social History  . Marital Status: Single    Spouse Name: N/A    Number of Children: 1  . Years of Education: N/A   Occupational History  . MANAGER    Social History Main Topics  . Smoking status: Never Smoker   . Smokeless tobacco: Never Used  . Alcohol Use: 0.6 oz/week    1 Glasses of wine per week     Comment: RARE  . Drug Use: No  . Sexually Active: Yes   Other Topics Concern  . Not on file   Social History Narrative  . No narrative on file    Family History  Problem Relation Age of Onset  . Diabetes Paternal Grandfather   . Hyperlipidemia Mother   . Hypertension Mother   . Hypertension Father   . Diabetes Father   . Prostate cancer Father   . Heart disease Maternal Grandmother   . Hypertension Maternal Grandmother   . Heart disease Maternal Grandfather   . Hypertension Maternal Grandfather   . Stroke Maternal Grandfather   . Stroke Paternal Grandfather     ROS: right shoulder pain but no fevers or chills, productive cough, hemoptysis, dysphasia, odynophagia, melena, hematochezia, dysuria, hematuria, rash, seizure activity, orthopnea, PND, claudication. Remaining systems are negative.  Physical Exam:   Blood pressure 120/80, pulse 74, weight 220 lb (99.791 kg).  General:  Well developed/well nourished in NAD Skin warm/dry Patient not depressed No peripheral clubbing Back-normal HEENT-normal/normal eyelids Neck supple/normal carotid  upstroke bilaterally; no bruits; no JVD; no thyromegaly  chest - CTA/ normal expansion CV - RRR/normal S1 and S2; no murmurs, rubs or gallops;  PMI nondisplaced Abdomen -NT/ND, no HSM, no mass, + bowel sounds, no bruit 2+ femoral pulses, no bruits Ext-no edema, obese Neuro-grossly nonfocal  ECG 03/07/2012-sinus rhythm with right bundle branch block. No change compared to September 02 2009

## 2012-04-04 ENCOUNTER — Encounter: Payer: Self-pay | Admitting: Cardiology

## 2012-04-30 ENCOUNTER — Telehealth: Payer: Self-pay | Admitting: *Deleted

## 2012-04-30 NOTE — Telephone Encounter (Signed)
Received fax refill request for alprazolam 0.5mg , 1 tablet twice a day. Last rx called in 03/23/12. Rx left on pharmacy voicemail, #60 x no refills.

## 2012-05-08 ENCOUNTER — Ambulatory Visit (INDEPENDENT_AMBULATORY_CARE_PROVIDER_SITE_OTHER): Payer: PRIVATE HEALTH INSURANCE | Admitting: Family

## 2012-05-08 ENCOUNTER — Encounter: Payer: Self-pay | Admitting: Family

## 2012-05-08 VITALS — BP 110/76 | HR 104 | Temp 98.3°F | Resp 16 | Wt 224.0 lb

## 2012-05-08 DIAGNOSIS — R300 Dysuria: Secondary | ICD-10-CM

## 2012-05-08 LAB — POCT URINALYSIS DIPSTICK
Blood, UA: NEGATIVE
Glucose, UA: NEGATIVE
Ketones, UA: NEGATIVE
Spec Grav, UA: <=1.005

## 2012-05-08 MED ORDER — CIPROFLOXACIN HCL 500 MG PO TABS: 500.0000 mg | ORAL_TABLET | Freq: Two times a day (BID) | ORAL | Status: DC

## 2012-05-08 NOTE — Patient Instructions (Addendum)
Please call if symptoms worsen or if no improvement in 2-3 days.  

## 2012-05-08 NOTE — Assessment & Plan Note (Signed)
Dip unremarkable.  Will treat empirically with cipro given hx and symptoms.  Obtain culture.

## 2012-05-08 NOTE — Progress Notes (Signed)
Subjective:    Patient ID: [name] [name], female    DOB: 1955/09/02, 57 y.o.   MRN: [id number]  HPI  Ms.  Marvel Plan is a 57 yr old female who presents today with chief complaint of urinary frequency. Symptoms started 3/23 and are associated with bladder pain and pressure.  Tried an Rx from Urology for bladder pain which helped.  She denies associated fever.  She was recently started on Mybetriq for overactive bladder by Urology  (Dr. Retta Diones).  Reports that she she has hx of cystitis.  Reports frequent UTI's.   Review of Systems See HPI  Past Medical History  Diagnosis Date  . GERD (gastroesophageal reflux disease)   . Allergy   . Osteoporosis   . Obesity     status post bariatric procedure in [facility] 2005  . Elevated alkaline phosphatase level     negative workup (presumed secondary to fatty liver)  . Thyroid nodule 7/10    `  . Interstitial cystitis     Dr Normajean Baxter  . 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 Neuromuscular disorder     rt arm carpal tunnel syndrome  . Anemia     takes iron supplement  . Arthritis   . Frequency-urgency syndrome   . Anxiety     History   Social History  . Marital Status: Single    Spouse Name: N/A    Number of Children: 1  . Years of Education: N/A   Occupational History  . MANAGER    Social History Main Topics  . Smoking status: Never Smoker   . Smokeless tobacco: Never Used  . Alcohol Use: 0.6 oz/week    1 Glasses of wine per week     Comment: RARE  . Drug Use: No  . Sexually Active: Yes   Other Topics Concern  . Not on file   Social History Narrative  . No narrative on file    Past Surgical History  Procedure Laterality Date  . Joint replacement  2008    left total knee  . Stomach surgery  2007    "tummy tuck"  . Gastric bypass  2005  . Eye surgery  1997    bilateral cataract extraction  . Knee surgery  1997    left knee  . Refractive surgery  2006    lasik  . Nasal sinus surgery  1989    Dr Lazarus Salines  .  Cryotherapy  1980  . Colposcopy  1980  . Shoulder surgery  2 2012    RIGHT   . Foot surgery      bilateral bunionettes  . Cysto with hydrodistension  12/12/2011    Procedure: CYSTOSCOPY/HYDRODISTENSION;  Surgeon: Marcine Matar, MD;  Location: [facility];  Service: Urology;  Laterality: N/A;  30 MIN   . Bladder surgery  02/21/12 and 02/28/12    Dr Retta Diones    Family History  Problem Relation Age of Onset  . Diabetes Paternal Grandfather   . Hyperlipidemia Mother   . Hypertension Mother   . Hypertension Father   . Diabetes Father   . Prostate cancer Father   . Heart disease Maternal Grandmother   . Hypertension Maternal Grandmother   . Heart disease Maternal Grandfather   . Hypertension Maternal Grandfather   . Stroke Maternal Grandfather   . Stroke Paternal Grandfather     Allergies  Allergen Reactions  . Codeine     REACTION: hives  . Cortisone  REACTION: palpitations  . 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  No allergy.  . Nitrofurantoin     REACTION: fever  . Prednisone     REACTION: palpitations    Current Outpatient Prescriptions on File Prior to Visit  Medication Sig Dispense Refill  . ALPRAZolam (XANAX) 0.5 MG tablet Take 1 tablet (0.5 mg total) by mouth 2 (two) times daily.  60 tablet  0  . Azelastine HCl (ASTEPRO) 0.15 % SOLN 1 spray by Each Nare route 2 (two) times daily.        . Bepotastine Besilate (BEPREVE) 1.5 % SOLN Apply 1 drop to eye 2 (two) times daily as needed.        . calcium elemental as carbonate (TUMS ULTRA 1000) 400 MG tablet Chew 1,000 mg by mouth 2 (two) times daily.        . cetirizine (ZYRTEC) 10 MG tablet Take 10 mg by mouth daily.        . chlorpheniramine-HYDROcodone (TUSSIONEX PENNKINETIC ER) 10-8 MG/5ML LQCR Take 5 mLs by mouth every 12 (twelve) hours as needed.  140 mL  0  . cholecalciferol (VITAMIN D) 1000 UNITS tablet Take 4,000 Units by mouth daily. Take 2 tablets by  mouth twice a day      . ferrous sulfate 325 (65 FE) MG tablet Take 1 tablet (325 mg total) by mouth 2 (two) times daily.  60 tablet  6  . furosemide (LASIX) 20 MG tablet Take 1 tablet (20 mg total) by mouth as directed. Take 1-2 tab daily  60 tablet  4  . hydrocodone-acetaminophen (LORCET-HD) 5-500 MG per capsule Take 1 capsule by mouth every 4 (four) hours as needed for pain.  20 capsule  0  . mometasone (NASONEX) 50 MCG/ACT nasal spray Place 1 spray into the nose 2 (two) times daily.       . Multiple Vitamin (MULTIVITAMIN) tablet Take 1 tablet by mouth 2 (two) times daily.        Marland Kitchen MYRBETRIQ 50 MG TB24 Take 50 mg by mouth daily.      . Omega-3 Fatty Acids (FISH OIL) 1000 MG CAPS Take 1 capsule by mouth 2 (two) times daily.        Marland Kitchen omeprazole (PRILOSEC) 40 MG capsule Take 40 mg by mouth 2 (two) times daily.        . Pyridoxine HCl (VITAMIN B6 PO) Take 1 tablet by mouth daily.      . sertraline (ZOLOFT) 50 MG tablet Take 1 tablet (50 mg total) by mouth daily.  30 tablet  12  . terconazole (TERAZOL 3) 0.8 % vaginal cream Place 1 applicator  vaginally at bedtime for 3 nights  20 g  2  . valACYclovir (VALTREX) 500 MG tablet Take 1 tablet (500 mg total) by mouth daily.  30 tablet  12  . vitamin A 16109 UNIT capsule Take 10,000 Units by mouth daily.         No current facility-administered medications on file prior to visit.    BP 110/76  Pulse 104  Temp(Src) 98.3 F (36.8 C) (Oral)  Resp 16  Wt 224 lb (101.606 kg)  BMI 37.28 kg/m2  SpO2 99%       Objective:   Physical Exam  Constitutional: She is oriented to person, place, and time. She appears well-developed and well-nourished. No distress.  HENT:  Head: Normocephalic and atraumatic.  Cardiovascular: Normal rate and regular rhythm.   No  murmur heard. Pulmonary/Chest: Effort normal and breath sounds normal. No respiratory distress. She has no wheezes. She has no rales. She exhibits no tenderness.  Abdominal: Soft. She exhibits no  distension.  + suprapubic tenderness to palpation  Genitourinary:  Neg CVAT bilaterall.  Mild lower back tenderness to palpation  Neurological: She is alert and oriented to person, place, and time.  Psychiatric: She has a normal mood and affect. Her behavior is normal. Judgment and thought content normal.          Assessment & Plan:

## 2012-05-09 LAB — URINE CULTURE
Colony Count: NO GROWTH
Organism ID, Bacteria: NO GROWTH

## 2012-05-18 ENCOUNTER — Other Ambulatory Visit (HOSPITAL_BASED_OUTPATIENT_CLINIC_OR_DEPARTMENT_OTHER): Payer: Self-pay | Admitting: *Deleted

## 2012-05-18 DIAGNOSIS — Z1239 Encounter for other screening for malignant neoplasm of breast: Secondary | ICD-10-CM

## 2012-05-21 ENCOUNTER — Telehealth: Payer: Self-pay

## 2012-05-21 NOTE — Telephone Encounter (Signed)
Pt left a message stating that she takes zoloft but has shoulder and knee pain and would like to know if she could be switched to Cymbalts? Please advise?

## 2012-05-21 NOTE — Telephone Encounter (Signed)
So I am not opposed to switching her but would need to see her to discuss potential side effects and check her vitals before I change it because her pulse was hi at her last visit

## 2012-05-22 NOTE — Telephone Encounter (Signed)
LMOM with contact name and number for return call RE: medication management and further provider instructions [OV needed] prior/SLS

## 2012-05-23 ENCOUNTER — Telehealth: Payer: Self-pay | Admitting: *Deleted

## 2012-05-23 ENCOUNTER — Encounter: Payer: Self-pay | Admitting: Family

## 2012-05-23 ENCOUNTER — Ambulatory Visit (INDEPENDENT_AMBULATORY_CARE_PROVIDER_SITE_OTHER): Payer: PRIVATE HEALTH INSURANCE | Admitting: Family

## 2012-05-23 VITALS — BP 100/80 | HR 65 | Temp 98.8°F | Resp 16 | Wt 225.1 lb

## 2012-05-23 DIAGNOSIS — M1990 Unspecified osteoarthritis, unspecified site: Secondary | ICD-10-CM

## 2012-05-23 DIAGNOSIS — F411 Generalized anxiety disorder: Secondary | ICD-10-CM

## 2012-05-23 MED ORDER — PAROXETINE HCL 20 MG PO TABS: 20.0000 mg | ORAL_TABLET | ORAL | Status: DC

## 2012-05-23 MED ORDER — GABAPENTIN 100 MG PO CAPS: 100.0000 mg | ORAL_CAPSULE | Freq: Three times a day (TID) | ORAL | Status: DC

## 2012-05-23 NOTE — Progress Notes (Signed)
Subjective:    Patient ID: [name] [name], female    DOB: 26-Feb-1955, 57 y.o.   MRN: [id number]  HPI  [name] [name] is a 57 yr old female who presents today for follow up.  1) anxiety- she was started on prozac after menopause due to anxiety.  Then went to zoloft.  Reports some improvement but notes that her anxiety is not as well controlled. Takes xanax at bedtime if she gets upset.  She is interested in cymbalta.    2) Foot pain- reports burning/stinging of the right foot following varicose vein surgery.  Some right ankle swelling. Trying to exercise more.    She will be having neck surgery "for the nerve in my neck." this is scheduled for MRI.    Review of Systems See HPI  Past Medical History  Diagnosis Date  . GERD (gastroesophageal reflux disease)   . Allergy   . Osteoporosis   . Obesity     status post bariatric procedure in [facility] 2005  . Elevated alkaline phosphatase level     negative workup (presumed secondary to fatty liver)  . Thyroid nodule 7/10    `  . Interstitial cystitis     Dr Normajean Baxter  . 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 Neuromuscular disorder     rt arm carpal tunnel syndrome  . Anemia     takes iron supplement  . Arthritis   . Frequency-urgency syndrome   . Anxiety     History   Social History  . Marital Status: Single    Spouse Name: N/A    Number of Children: 1  . Years of Education: N/A   Occupational History  . MANAGER    Social History Main Topics  . Smoking status: Never Smoker   . Smokeless tobacco: Never Used  . Alcohol Use: 0.6 oz/week    1 Glasses of wine per week     Comment: RARE  . Drug Use: No  . Sexually Active: Yes   Other Topics Concern  . Not on file   Social History Narrative  . No narrative on file    Past Surgical History  Procedure Laterality Date  . Joint replacement  2008    left total knee  . Stomach surgery  2007    "tummy tuck"  . Gastric bypass  2005  . Eye surgery  1997    bilateral  cataract extraction  . Knee surgery  1997    left knee  . Refractive surgery  2006    lasik  . Nasal sinus surgery  1989    Dr Lazarus Salines  . Cryotherapy  1980  . Colposcopy  1980  . Shoulder surgery  2 2012    RIGHT   . Foot surgery      bilateral bunionettes  . Cysto with hydrodistension  12/12/2011    Procedure: CYSTOSCOPY/HYDRODISTENSION;  Surgeon: Marcine Matar, MD;  Location: [facility];  Service: Urology;  Laterality: N/A;  30 MIN   . Bladder surgery  02/21/12 and 02/28/12    Dr Retta Diones    Family History  Problem Relation Age of Onset  . Diabetes Paternal Grandfather   . Hyperlipidemia Mother   . Hypertension Mother   . Hypertension Father   . Diabetes Father   . Prostate cancer Father   . Heart disease Maternal Grandmother   . Hypertension Maternal Grandmother   . Heart disease Maternal Grandfather   . Hypertension Maternal Grandfather   .  Stroke Maternal Grandfather   . Stroke Paternal Grandfather     Allergies  Allergen Reactions  . Codeine     REACTION: hives  . Cortisone     REACTION: palpitations  . 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  No allergy.  . Nitrofurantoin     REACTION: fever  . Prednisone     REACTION: palpitations    Current Outpatient Prescriptions on File Prior to Visit  Medication Sig Dispense Refill  . ALPRAZolam (XANAX) 0.5 MG tablet Take 1 tablet (0.5 mg total) by mouth 2 (two) times daily.  60 tablet  0  . Azelastine HCl (ASTEPRO) 0.15 % SOLN 1 spray by Each Nare route 2 (two) times daily.        . Bepotastine Besilate (BEPREVE) 1.5 % SOLN Apply 1 drop to eye 2 (two) times daily as needed.        . calcium elemental as carbonate (TUMS ULTRA 1000) 400 MG tablet Chew 1,000 mg by mouth 2 (two) times daily.        . cetirizine (ZYRTEC) 10 MG tablet Take 10 mg by mouth daily.        . cholecalciferol (VITAMIN D) 1000 UNITS tablet Take 4,000 Units by mouth daily. Take 2 tablets by  mouth twice a day      . ferrous sulfate 325 (65 FE) MG tablet Take 1 tablet (325 mg total) by mouth 2 (two) times daily.  60 tablet  6  . furosemide (LASIX) 20 MG tablet Take 1 tablet (20 mg total) by mouth as directed. Take 1-2 tab daily  60 tablet  4  . mometasone (NASONEX) 50 MCG/ACT nasal spray Place 1 spray into the nose 2 (two) times daily.       . Multiple Vitamin (MULTIVITAMIN) tablet Take 1 tablet by mouth 2 (two) times daily.        Marland Kitchen MYRBETRIQ 50 MG TB24 Take 50 mg by mouth daily.      . Omega-3 Fatty Acids (FISH OIL) 1000 MG CAPS Take 1 capsule by mouth 2 (two) times daily.        Marland Kitchen omeprazole (PRILOSEC) 40 MG capsule Take 40 mg by mouth 2 (two) times daily.        . Pyridoxine HCl (VITAMIN B6 PO) Take 1 tablet by mouth daily.      Marland Kitchen terconazole (TERAZOL 3) 0.8 % vaginal cream Place 1 applicator  vaginally at bedtime for 3 nights  20 g  2  . valACYclovir (VALTREX) 500 MG tablet Take 1 tablet (500 mg total) by mouth daily.  30 tablet  12  . vitamin A 16109 UNIT capsule Take 10,000 Units by mouth daily.         No current facility-administered medications on file prior to visit.    BP 100/80  Pulse 65  Temp(Src) 98.8 F (37.1 C) (Oral)  Resp 16  Wt 225 lb 1.3 oz (102.096 kg)  BMI 37.46 kg/m2  SpO2 99%       Objective:   Physical Exam  Constitutional: She is oriented to person, place, and time. She appears well-developed and well-nourished. No distress.  Cardiovascular: Normal rate and regular rhythm.   No murmur heard. Musculoskeletal:  Mild swelling of right ankle.   Neurological: She is alert and oriented to person, place, and time.  Psychiatric: She has a normal mood and affect. Her behavior is normal. Judgment and thought content normal.  Assessment & [name]:

## 2012-05-23 NOTE — Patient Instructions (Addendum)
Please follow up in 1 month.  

## 2012-05-23 NOTE — Assessment & Plan Note (Signed)
Deteriorated. Stop zoloft, trial of paxil.

## 2012-05-23 NOTE — Assessment & Plan Note (Signed)
She has chronic OA pain.  Also reports some "burning pain" in right foot and requests med for this pain. Trial of gabapentin. Continue tylenol prn OA pain. Avoid NSAIDS due to hx of bariatric surgery.

## 2012-05-23 NOTE — Telephone Encounter (Signed)
Received message from Sam at [facility] wanting to make sure we were aware that pt is taking sertraline as we sent a new Rx for Paroxetine today. Notified pharmacist that pt should be stopping zoloft and starting new Rx.

## 2012-05-29 ENCOUNTER — Ambulatory Visit: Payer: PRIVATE HEALTH INSURANCE | Admitting: Family

## 2012-05-30 ENCOUNTER — Telehealth: Payer: Self-pay | Admitting: *Deleted

## 2012-05-30 NOTE — Telephone Encounter (Signed)
Left detailed message on cell# and to call if any questions. Also advised pt to call our office to arrange f/u as instructed below.

## 2012-05-30 NOTE — Telephone Encounter (Signed)
She can try increasing to 30 mg (1.5) tabs once daily until she is seen back in the office.  She is due for follow up in the office around 5/9.

## 2012-05-30 NOTE — Telephone Encounter (Signed)
Pt left message stating that Paxil seems to be wearing off in the afternoon / evening as she experiences more anxiety around this time of day and also is not sleeping well at night. Pt wants to know if she can increase her dose?  Please advise.

## 2012-05-31 MED ORDER — PAROXETINE HCL 20 MG PO TABS
30.0000 mg | ORAL_TABLET | ORAL | Status: DC
Start: 2012-05-31 — End: 2012-06-15

## 2012-05-31 NOTE — Telephone Encounter (Signed)
Pt called back stating she will be out of medication before her next follow up. Advised pt refill will be sent to pharmacy. Follow up scheduled for 06/15/12.

## 2012-05-31 NOTE — Addendum Note (Signed)
Addended by: Mervin Kung A on: 05/31/2012 12:46 PM   Modules accepted: Orders

## 2012-06-04 ENCOUNTER — Telehealth: Payer: Self-pay | Admitting: Family

## 2012-06-04 MED ORDER — ALPRAZOLAM 0.5 MG PO TABS: 0.5000 mg | ORAL_TABLET | Freq: Two times a day (BID) | ORAL | Status: DC

## 2012-06-04 NOTE — Telephone Encounter (Signed)
Alprazolam 0.5 mg tab take 1 tablet mouth twice daily qty 60

## 2012-06-04 NOTE — Telephone Encounter (Signed)
Refill left on pharmacy voicemail, #60 x no refills.

## 2012-06-07 ENCOUNTER — Ambulatory Visit (HOSPITAL_BASED_OUTPATIENT_CLINIC_OR_DEPARTMENT_OTHER)
Admission: RE | Admit: 2012-06-07 | Discharge: 2012-06-07 | Disposition: A | Discharge status: Home / Self Care | Payer: PRIVATE HEALTH INSURANCE | Source: Ambulatory Visit | Attending: Obstetrics and Gynecology | Admitting: Obstetrics and Gynecology

## 2012-06-07 DIAGNOSIS — Z1231 Encounter for screening mammogram for malignant neoplasm of breast: Secondary | ICD-10-CM | POA: Insufficient documentation

## 2012-06-07 DIAGNOSIS — Z1239 Encounter for other screening for malignant neoplasm of breast: Secondary | ICD-10-CM

## 2012-06-14 ENCOUNTER — Encounter (HOSPITAL_COMMUNITY): Payer: Self-pay | Admitting: Pharmacy Technician

## 2012-06-15 ENCOUNTER — Ambulatory Visit (INDEPENDENT_AMBULATORY_CARE_PROVIDER_SITE_OTHER): Payer: PRIVATE HEALTH INSURANCE | Admitting: Family

## 2012-06-15 ENCOUNTER — Encounter: Payer: Self-pay | Admitting: Family

## 2012-06-15 VITALS — BP 124/76 | HR 73 | Temp 98.3°F | Resp 16 | Wt 231.0 lb

## 2012-06-15 DIAGNOSIS — F411 Generalized anxiety disorder: Secondary | ICD-10-CM

## 2012-06-15 MED ORDER — PAROXETINE HCL 30 MG PO TABS: 30.0000 mg | ORAL_TABLET | ORAL | Status: DC

## 2012-06-15 NOTE — Patient Instructions (Addendum)
Please schedule a follow up appointment in 3 months.

## 2012-06-15 NOTE — Progress Notes (Signed)
Subjective:    Patient ID: [name] [name], female    DOB: [birth year], 56 y.o.   MRN: [id number]  HPI  Anxiety- Pt reports that her anxiety is well controlled.  Reports feeling much better on the 30mg  dose than the 20mg  dose.  Still needing xanax at night.   She has gained 6 pounds according to our scale which she attributes to travelling for work last week and eating poorly as well as weighing today with her tennis shoes on.     Review of Systems  Respiratory: Negative for shortness of breath.   Cardiovascular:       Reports chronic RLE swelling.    See HPI  Past Medical History  Diagnosis Date  . GERD (gastroesophageal reflux disease)   . Allergy   . Osteoporosis   . Obesity     status post bariatric procedure in [facility] 2005  . Elevated alkaline phosphatase level     negative workup (presumed secondary to fatty liver)  . Thyroid nodule 7/10    `  . Interstitial cystitis     Dr Normajean Baxter  . 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 Neuromuscular disorder     rt arm carpal tunnel syndrome  . Anemia     takes iron supplement  . Arthritis   . Frequency-urgency syndrome   . Anxiety     History   Social History  . Marital Status: Single    Spouse Name: N/A    Number of Children: 1  . Years of Education: N/A   Occupational History  . MANAGER    Social History Main Topics  . Smoking status: Never Smoker   . Smokeless tobacco: Never Used  . Alcohol Use: 0.6 oz/week    1 Glasses of wine per week     Comment: RARE  . Drug Use: No  . Sexually Active: Yes   Other Topics Concern  . Not on file   Social History Narrative  . No narrative on file    Past Surgical History  Procedure Laterality Date  . Joint replacement  2008    left total knee  . Stomach surgery  2007    "tummy tuck"  . Gastric bypass  2005  . Eye surgery  1997    bilateral cataract extraction  . Knee surgery  1997    left knee  . Refractive surgery  2006    lasik  . Nasal sinus surgery   1989    Dr Lazarus Salines  . Cryotherapy  1980  . Colposcopy  1980  . Shoulder surgery  2 2012    RIGHT   . Foot surgery      bilateral bunionettes  . Cysto with hydrodistension  12/12/2011    Procedure: CYSTOSCOPY/HYDRODISTENSION;  Surgeon: Marcine Matar, MD;  Location: [facility];  Service: Urology;  Laterality: N/A;  30 MIN   . Bladder surgery  02/21/12 and 02/28/12    Dr Retta Diones    Family History  Problem Relation Age of Onset  . Diabetes Paternal Grandfather   . Hyperlipidemia Mother   . Hypertension Mother   . Hypertension Father   . Diabetes Father   . Prostate cancer Father   . Heart disease Maternal Grandmother   . Hypertension Maternal Grandmother   . Heart disease Maternal Grandfather   . Hypertension Maternal Grandfather   . Stroke Maternal Grandfather   . Stroke Paternal Grandfather     Allergies  Allergen Reactions  . Codeine  Itching    REACTION: hives  . Cortisone     REACTION: palpitations  . 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  No allergy.  . Nitrofurantoin     REACTION: fever  . Prednisone     REACTION: palpitations    Current Outpatient Prescriptions on File Prior to Visit  Medication Sig Dispense Refill  . ALPRAZolam (XANAX) 0.5 MG tablet Take 1 tablet (0.5 mg total) by mouth 2 (two) times daily.  60 tablet  0  . amitriptyline (ELAVIL) 100 MG tablet Take 100 mg by mouth at bedtime.      . Azelastine HCl (ASTEPRO) 0.15 % SOLN 1 spray by Each Nare route 2 (two) times daily.        . Bepotastine Besilate (BEPREVE) 1.5 % SOLN Apply 1 drop to eye 2 (two) times daily as needed (allergies).       . calcium elemental as carbonate (TUMS ULTRA 1000) 400 MG tablet Chew 1,000 mg by mouth 2 (two) times daily.        . cetirizine (ZYRTEC) 10 MG tablet Take 10 mg by mouth daily.        . Cholecalciferol (VITAMIN D) 2000 UNITS CAPS Take 2,000 Units by mouth 2 (two) times daily.      . ferrous sulfate 325 (65  FE) MG tablet Take 1 tablet (325 mg total) by mouth 2 (two) times daily.  60 tablet  6  . mometasone (NASONEX) 50 MCG/ACT nasal spray Place 1 spray into the nose 2 (two) times daily.       . Multiple Vitamin (MULTIVITAMIN) tablet Take 1 tablet by mouth 2 (two) times daily.        Marland Kitchen MYRBETRIQ 50 MG TB24 Take 50 mg by mouth daily.      . Omega-3 Fatty Acids (FISH OIL) 1000 MG CAPS Take 1 capsule by mouth 2 (two) times daily.        Marland Kitchen omeprazole (PRILOSEC) 40 MG capsule Take 40 mg by mouth 2 (two) times daily.        Marland Kitchen PARoxetine (PAXIL) 20 MG tablet Take 1.5 tablets (30 mg total) by mouth every morning.  45 tablet  0  . Pyridoxine HCl (VITAMIN B6 PO) Take 1 tablet by mouth daily.      . valACYclovir (VALTREX) 500 MG tablet Take 500 mg by mouth daily as needed.      Marland Kitchen VITAMIN A PO Take 1 tablet by mouth daily.        No current facility-administered medications on file prior to visit.    BP 124/76  Pulse 73  Temp(Src) 98.3 F (36.8 C) (Oral)  Resp 16  Wt 231 lb (104.781 kg)  BMI 38.44 kg/m2  SpO2 98%       Objective:   Physical Exam  Constitutional: She is oriented to person, place, and time. She appears well-developed and well-nourished. No distress.  HENT:  Head: Normocephalic and atraumatic.  Cardiovascular: Normal rate and regular rhythm.   No murmur heard. Pulmonary/Chest: Effort normal and breath sounds normal. No respiratory distress. She has no wheezes. She has no rales. She exhibits no tenderness.  Musculoskeletal:  2+ RLE edema (wearing compression hose)  Lymphadenopathy:    She has no cervical adenopathy.  Neurological: She is alert and oriented to person, place, and time.  Psychiatric: She has a normal mood and affect. Her behavior is normal. Judgment and thought content normal.  Assessment & Plan:  She brings with her today a form for pre-operative clearance for her upcoming cervical surgery.  She has her pre-op testing on Monday.  I told her that I  will need to review this information prior to signing.  Her surgery is scheduled for Thursday.

## 2012-06-15 NOTE — Assessment & Plan Note (Signed)
Improved on current dose of paxil.  We did discuss that she should monitor her weight closely and let me know if she has any further weight gain. She verbalizes understanding.

## 2012-06-18 ENCOUNTER — Encounter (HOSPITAL_COMMUNITY)
Admission: RE | Admit: 2012-06-18 | Discharge: 2012-06-18 | Disposition: A | Discharge status: Home / Self Care | Payer: PRIVATE HEALTH INSURANCE | Source: Ambulatory Visit | Attending: Orthopedic Surgery | Admitting: Orthopedic Surgery

## 2012-06-18 ENCOUNTER — Encounter (HOSPITAL_COMMUNITY): Payer: Self-pay

## 2012-06-18 ENCOUNTER — Telehealth (INDEPENDENT_AMBULATORY_CARE_PROVIDER_SITE_OTHER): Payer: PRIVATE HEALTH INSURANCE | Admitting: *Deleted

## 2012-06-18 ENCOUNTER — Ambulatory Visit (HOSPITAL_COMMUNITY)
Admission: RE | Admit: 2012-06-18 | Discharge: 2012-06-18 | Disposition: A | Discharge status: Home / Self Care | Payer: PRIVATE HEALTH INSURANCE | Source: Ambulatory Visit | Attending: Orthopedic Surgery | Admitting: Orthopedic Surgery

## 2012-06-18 DIAGNOSIS — Z01818 Encounter for other preprocedural examination: Secondary | ICD-10-CM | POA: Insufficient documentation

## 2012-06-18 DIAGNOSIS — M47812 Spondylosis without myelopathy or radiculopathy, cervical region: Secondary | ICD-10-CM | POA: Insufficient documentation

## 2012-06-18 DIAGNOSIS — Z01812 Encounter for preprocedural laboratory examination: Secondary | ICD-10-CM | POA: Insufficient documentation

## 2012-06-18 HISTORY — DX: Asymptomatic varicose veins of unspecified lower extremity: I83.90

## 2012-06-18 LAB — BASIC METABOLIC PANEL
BUN: 11 mg/dL (ref 6–23)
CO2: 27 mEq/L (ref 19–32)
Calcium: 9.1 mg/dL (ref 8.4–10.5)
Creatinine, Ser: 0.5 mg/dL (ref 0.50–1.10)

## 2012-06-18 LAB — CBC
MCH: 29 pg (ref 26.0–34.0)
MCV: 87 fL (ref 78.0–100.0)
Platelets: 276 10*3/uL (ref 150–400)
RBC: 4.31 MIL/uL (ref 3.87–5.11)

## 2012-06-18 NOTE — Pre-Procedure Instructions (Signed)
[name] [name]  06/18/2012   Your procedure is scheduled on:  Thursday  06/21/12   Report to [facility] at 530 AM.  Call this number if you have problems the morning of surgery: [phone]   Remember:   Do not eat food or drink liquids after midnight.   Take these medicines the morning of surgery with A SIP OF WATER:  ALPRAZOLAM , EYE DROPS, OMEPRAZOLE(PRILOSEC), PAXIL, MYRBETRIQ   Do not wear jewelry, make-up or nail polish.  Do not wear lotions, powders, or perfumes. You may wear deodorant.  Do not shave 48 hours prior to surgery. Men may shave face and neck.  Do not bring valuables to the hospital.  Contacts, dentures or bridgework may not be worn into surgery.  Leave suitcase in the car. After surgery it may be brought to your room.  For patients admitted to the hospital, checkout time is 11:00 AM the day of  discharge.   Patients discharged the day of surgery will not be allowed to drive  home.  Name and phone number of your driver:  Special Instructions: Shower using CHG 2 nights before surgery and the night before surgery.  If you shower the day of surgery use CHG.  Use special wash - you have one bottle of CHG for all showers.  You should use approximately 1/3 of the bottle for each shower.   Please read over the following fact sheets that you were given: Pain Booklet, Coughing and Deep Breathing, MRSA Information and Surgical Site Infection Prevention   BRING BRACE TO HOSPITAL !!!!!!!!

## 2012-06-18 NOTE — Progress Notes (Signed)
NEED TO CALL DR MELISSA OSULLIVAN TO GET CLEARANCE [phone].(MD LEFT OFFICE EARLY ).

## 2012-06-18 NOTE — Telephone Encounter (Signed)
Received call from Delice Bison at [facility] pre-op requesting our signed surgical clearance statement be faxed to them at [phone] as well as to the orthopedics. Pt's surgery is scheduled for this Thursday.  Please advise.

## 2012-06-19 LAB — POCT URINALYSIS DIPSTICK
Blood, UA: NEGATIVE
Ketones, UA: NEGATIVE
Protein, UA: NEGATIVE
Spec Grav, UA: 1.01
pH, UA: 6

## 2012-06-19 NOTE — Telephone Encounter (Signed)
Urinalysis complete and culture sent to lab.

## 2012-06-19 NOTE — Telephone Encounter (Signed)
Notified pt and she will come by around 2:30 to leave a specimen.

## 2012-06-19 NOTE — Telephone Encounter (Signed)
Patient needs urinalysis and culture sent.  Other labs look ok.  Once I see UA, I can clear pt.

## 2012-06-19 NOTE — Telephone Encounter (Signed)
Form faxed to [facility] at [phone] and pre-op # below.

## 2012-06-20 MED ORDER — ACETAMINOPHEN 10 MG/ML IV SOLN
1000.0000 mg | Freq: Once | INTRAVENOUS | Status: AC
  Administered 2012-06-21: 1000 mg via INTRAVENOUS
  Filled 2012-06-20: qty 100

## 2012-06-20 MED ORDER — CEFAZOLIN SODIUM-DEXTROSE 2-3 GM-% IV SOLR
2.0000 g | INTRAVENOUS | Status: AC
  Administered 2012-06-21: 2 g via INTRAVENOUS
  Filled 2012-06-20: qty 50

## 2012-06-21 ENCOUNTER — Ambulatory Visit (HOSPITAL_COMMUNITY): Payer: PRIVATE HEALTH INSURANCE | Admitting: Anesthesiology

## 2012-06-21 ENCOUNTER — Ambulatory Visit (HOSPITAL_COMMUNITY)
Admission: RE | Admit: 2012-06-21 | Discharge: 2012-06-22 | Disposition: A | Discharge status: Home / Self Care | Payer: PRIVATE HEALTH INSURANCE | Source: Ambulatory Visit | Attending: Orthopedic Surgery | Admitting: Orthopedic Surgery

## 2012-06-21 ENCOUNTER — Encounter (HOSPITAL_COMMUNITY): Payer: Self-pay | Admitting: Anesthesiology

## 2012-06-21 ENCOUNTER — Ambulatory Visit (HOSPITAL_COMMUNITY): Payer: PRIVATE HEALTH INSURANCE

## 2012-06-21 ENCOUNTER — Encounter (HOSPITAL_COMMUNITY)
Admission: RE | Disposition: A | Discharge status: Home / Self Care | Payer: Self-pay | Source: Ambulatory Visit | Attending: Orthopedic Surgery

## 2012-06-21 ENCOUNTER — Encounter (HOSPITAL_COMMUNITY): Payer: Self-pay | Admitting: Surgery

## 2012-06-21 DIAGNOSIS — I10 Essential (primary) hypertension: Secondary | ICD-10-CM | POA: Insufficient documentation

## 2012-06-21 DIAGNOSIS — M47812 Spondylosis without myelopathy or radiculopathy, cervical region: Secondary | ICD-10-CM | POA: Insufficient documentation

## 2012-06-21 HISTORY — PX: ANTERIOR CERVICAL DECOMP/DISCECTOMY FUSION: SHX1161

## 2012-06-21 LAB — URINE CULTURE

## 2012-06-21 SURGERY — ANTERIOR CERVICAL DECOMPRESSION/DISCECTOMY FUSION 1 LEVEL
Anesthesia: General | Wound class: Clean

## 2012-06-21 MED ORDER — ONDANSETRON HCL 4 MG/2ML IJ SOLN: 4.0000 mg | INTRAMUSCULAR | Status: DC | PRN

## 2012-06-21 MED ORDER — THROMBIN 20000 UNITS EX SOLR
CUTANEOUS | Status: DC | PRN
  Administered 2012-06-21: 08:00:00 via TOPICAL

## 2012-06-21 MED ORDER — NEOSTIGMINE METHYLSULFATE 1 MG/ML IJ SOLN
INTRAMUSCULAR | Status: DC | PRN
  Administered 2012-06-21: 4 mg via INTRAVENOUS

## 2012-06-21 MED ORDER — LACTATED RINGERS IV SOLN
INTRAVENOUS | Status: DC | PRN
  Administered 2012-06-21 (×2): via INTRAVENOUS

## 2012-06-21 MED ORDER — PAROXETINE HCL 30 MG PO TABS
30.0000 mg | ORAL_TABLET | Freq: Every day | ORAL | Status: DC
  Administered 2012-06-22: 30 mg via ORAL
  Filled 2012-06-21: qty 1

## 2012-06-21 MED ORDER — CEFAZOLIN SODIUM 1-5 GM-% IV SOLN
1.0000 g | Freq: Three times a day (TID) | INTRAVENOUS | Status: AC
  Administered 2012-06-21 (×2): 1 g via INTRAVENOUS
  Filled 2012-06-21 (×2): qty 50

## 2012-06-21 MED ORDER — POLYETHYLENE GLYCOL 3350 17 GM/SCOOP PO POWD: 17.0000 g | Freq: Every day | ORAL | Status: DC

## 2012-06-21 MED ORDER — SODIUM CHLORIDE 0.9 % IJ SOLN: 3.0000 mL | INTRAMUSCULAR | Status: DC | PRN

## 2012-06-21 MED ORDER — OXYCODONE HCL 5 MG PO TABS
10.0000 mg | ORAL_TABLET | ORAL | Status: DC | PRN
  Administered 2012-06-21 – 2012-06-22 (×5): 10 mg via ORAL
  Filled 2012-06-21 (×4): qty 2

## 2012-06-21 MED ORDER — MIDAZOLAM HCL 5 MG/5ML IJ SOLN
INTRAMUSCULAR | Status: DC | PRN
  Administered 2012-06-21: 2 mg via INTRAVENOUS

## 2012-06-21 MED ORDER — HYDROCODONE-ACETAMINOPHEN 10-325 MG PO TABS: 1.0000 | ORAL_TABLET | Freq: Four times a day (QID) | ORAL | Status: DC | PRN

## 2012-06-21 MED ORDER — SODIUM CHLORIDE 0.9 % IV SOLN: 250.0000 mL | INTRAVENOUS | Status: DC

## 2012-06-21 MED ORDER — THROMBIN 20000 UNITS EX SOLR
CUTANEOUS | Status: AC
  Filled 2012-06-21: qty 20000

## 2012-06-21 MED ORDER — VECURONIUM BROMIDE 10 MG IV SOLR
INTRAVENOUS | Status: DC | PRN
  Administered 2012-06-21: 1 mg via INTRAVENOUS

## 2012-06-21 MED ORDER — DIPHENHYDRAMINE HCL 50 MG/ML IJ SOLN
12.5000 mg | Freq: Four times a day (QID) | INTRAMUSCULAR | Status: DC | PRN
  Administered 2012-06-21: 12.5 mg via INTRAVENOUS
  Filled 2012-06-21: qty 1

## 2012-06-21 MED ORDER — METHOCARBAMOL 500 MG PO TABS: 500.0000 mg | ORAL_TABLET | Freq: Three times a day (TID) | ORAL | Status: DC | PRN

## 2012-06-21 MED ORDER — FENTANYL CITRATE 0.05 MG/ML IJ SOLN
INTRAMUSCULAR | Status: AC
  Filled 2012-06-21: qty 2

## 2012-06-21 MED ORDER — ALPRAZOLAM 0.5 MG PO TABS
0.5000 mg | ORAL_TABLET | Freq: Two times a day (BID) | ORAL | Status: DC
  Administered 2012-06-21 – 2012-06-22 (×2): 0.5 mg via ORAL
  Filled 2012-06-21 (×2): qty 1

## 2012-06-21 MED ORDER — PANTOPRAZOLE SODIUM 40 MG PO TBEC
40.0000 mg | DELAYED_RELEASE_TABLET | Freq: Every day | ORAL | Status: DC
  Administered 2012-06-22: 40 mg via ORAL
  Filled 2012-06-21: qty 1

## 2012-06-21 MED ORDER — ACETAMINOPHEN 10 MG/ML IV SOLN
1000.0000 mg | Freq: Four times a day (QID) | INTRAVENOUS | Status: DC
  Administered 2012-06-21 – 2012-06-22 (×3): 1000 mg via INTRAVENOUS
  Filled 2012-06-21 (×4): qty 100

## 2012-06-21 MED ORDER — METHOCARBAMOL 100 MG/ML IJ SOLN
500.0000 mg | Freq: Four times a day (QID) | INTRAMUSCULAR | Status: DC | PRN
  Filled 2012-06-21: qty 5

## 2012-06-21 MED ORDER — SODIUM CHLORIDE 0.9 % IJ SOLN: 3.0000 mL | Freq: Two times a day (BID) | INTRAMUSCULAR | Status: DC

## 2012-06-21 MED ORDER — METOCLOPRAMIDE HCL 5 MG/ML IJ SOLN: 10.0000 mg | Freq: Once | INTRAMUSCULAR | Status: DC | PRN

## 2012-06-21 MED ORDER — MENTHOL 3 MG MT LOZG: 1.0000 | LOZENGE | OROMUCOSAL | Status: DC | PRN

## 2012-06-21 MED ORDER — LACTATED RINGERS IV SOLN: INTRAVENOUS | Status: DC

## 2012-06-21 MED ORDER — METHOCARBAMOL 500 MG PO TABS
ORAL_TABLET | ORAL | Status: AC
  Filled 2012-06-21: qty 1

## 2012-06-21 MED ORDER — ONDANSETRON HCL 4 MG/2ML IJ SOLN
INTRAMUSCULAR | Status: DC | PRN
  Administered 2012-06-21: 4 mg via INTRAVENOUS

## 2012-06-21 MED ORDER — BEPOTASTINE BESILATE 1.5 % OP SOLN: 1.0000 [drp] | Freq: Two times a day (BID) | OPHTHALMIC | Status: DC | PRN

## 2012-06-21 MED ORDER — MIRABEGRON ER 50 MG PO TB24
50.0000 mg | ORAL_TABLET | Freq: Every day | ORAL | Status: DC
  Administered 2012-06-22: 50 mg via ORAL
  Filled 2012-06-21: qty 1

## 2012-06-21 MED ORDER — AMITRIPTYLINE HCL 100 MG PO TABS
100.0000 mg | ORAL_TABLET | Freq: Every day | ORAL | Status: DC
  Administered 2012-06-21: 100 mg via ORAL
  Filled 2012-06-21 (×2): qty 1

## 2012-06-21 MED ORDER — FERROUS SULFATE 325 (65 FE) MG PO TABS
325.0000 mg | ORAL_TABLET | Freq: Two times a day (BID) | ORAL | Status: DC
  Administered 2012-06-21 – 2012-06-22 (×2): 325 mg via ORAL
  Filled 2012-06-21 (×3): qty 1

## 2012-06-21 MED ORDER — FENTANYL CITRATE 0.05 MG/ML IJ SOLN
25.0000 ug | INTRAMUSCULAR | Status: DC | PRN
  Administered 2012-06-21 (×3): 50 ug via INTRAVENOUS

## 2012-06-21 MED ORDER — ACETAMINOPHEN 10 MG/ML IV SOLN
INTRAVENOUS | Status: AC
  Filled 2012-06-21: qty 100

## 2012-06-21 MED ORDER — 0.9 % SODIUM CHLORIDE (POUR BTL) OPTIME
TOPICAL | Status: DC | PRN
  Administered 2012-06-21: 1000 mL

## 2012-06-21 MED ORDER — METOCLOPRAMIDE HCL 5 MG/ML IJ SOLN
INTRAMUSCULAR | Status: DC | PRN
  Administered 2012-06-21: 20 mg via INTRAVENOUS

## 2012-06-21 MED ORDER — FENTANYL CITRATE 0.05 MG/ML IJ SOLN
INTRAMUSCULAR | Status: DC | PRN
  Administered 2012-06-21: 150 ug via INTRAVENOUS
  Administered 2012-06-21: 100 ug via INTRAVENOUS

## 2012-06-21 MED ORDER — PHENOL 1.4 % MT LIQD: 1.0000 | OROMUCOSAL | Status: DC | PRN

## 2012-06-21 MED ORDER — ONDANSETRON HCL 4 MG PO TABS: 4.0000 mg | ORAL_TABLET | Freq: Three times a day (TID) | ORAL | Status: DC | PRN

## 2012-06-21 MED ORDER — DOCUSATE SODIUM 100 MG PO CAPS: 100.0000 mg | ORAL_CAPSULE | Freq: Three times a day (TID) | ORAL | Status: DC | PRN

## 2012-06-21 MED ORDER — BUPIVACAINE-EPINEPHRINE PF 0.25-1:200000 % IJ SOLN
INTRAMUSCULAR | Status: AC
  Filled 2012-06-21: qty 30

## 2012-06-21 MED ORDER — AZELASTINE HCL 0.1 % NA SOLN
1.0000 | Freq: Two times a day (BID) | NASAL | Status: DC
  Administered 2012-06-21 (×2): 1 via NASAL
  Filled 2012-06-21 (×2): qty 30

## 2012-06-21 MED ORDER — DIPHENHYDRAMINE HCL 25 MG PO CAPS: 25.0000 mg | ORAL_CAPSULE | Freq: Four times a day (QID) | ORAL | Status: DC | PRN

## 2012-06-21 MED ORDER — LIDOCAINE HCL (CARDIAC) 20 MG/ML IV SOLN
INTRAVENOUS | Status: DC | PRN
  Administered 2012-06-21: 100 mg via INTRAVENOUS

## 2012-06-21 MED ORDER — BUPIVACAINE-EPINEPHRINE 0.25% -1:200000 IJ SOLN
INTRAMUSCULAR | Status: DC | PRN
  Administered 2012-06-21: 5 mL

## 2012-06-21 MED ORDER — ROCURONIUM BROMIDE 100 MG/10ML IV SOLN
INTRAVENOUS | Status: DC | PRN
  Administered 2012-06-21: 50 mg via INTRAVENOUS

## 2012-06-21 MED ORDER — GLYCOPYRROLATE 0.2 MG/ML IJ SOLN
INTRAMUSCULAR | Status: DC | PRN
  Administered 2012-06-21: 0.6 mg via INTRAVENOUS

## 2012-06-21 MED ORDER — ZOLPIDEM TARTRATE 5 MG PO TABS: 5.0000 mg | ORAL_TABLET | Freq: Every evening | ORAL | Status: DC | PRN

## 2012-06-21 MED ORDER — AZELASTINE HCL 0.15 % NA SOLN: 1.0000 | Freq: Two times a day (BID) | NASAL | Status: DC

## 2012-06-21 MED ORDER — CROMOLYN SODIUM 4 % OP SOLN
1.0000 [drp] | Freq: Two times a day (BID) | OPHTHALMIC | Status: DC | PRN
  Filled 2012-06-21: qty 10

## 2012-06-21 MED ORDER — FUROSEMIDE 20 MG PO TABS: 20.0000 mg | ORAL_TABLET | ORAL | Status: DC | PRN

## 2012-06-21 MED ORDER — OXYCODONE HCL 5 MG PO TABS
ORAL_TABLET | ORAL | Status: AC
  Filled 2012-06-21: qty 2

## 2012-06-21 MED ORDER — WHITE PETROLATUM GEL
Status: AC
  Filled 2012-06-21: qty 5

## 2012-06-21 MED ORDER — METHOCARBAMOL 500 MG PO TABS
500.0000 mg | ORAL_TABLET | Freq: Four times a day (QID) | ORAL | Status: DC | PRN
  Administered 2012-06-21 (×2): 500 mg via ORAL
  Filled 2012-06-21: qty 1

## 2012-06-21 MED ORDER — PROPOFOL 10 MG/ML IV BOLUS
INTRAVENOUS | Status: DC | PRN
  Administered 2012-06-21: 200 mg via INTRAVENOUS

## 2012-06-21 MED ORDER — DIPHENHYDRAMINE HCL 50 MG/ML IJ SOLN
INTRAMUSCULAR | Status: AC
  Administered 2012-06-21: 12.5 mg
  Filled 2012-06-21: qty 1

## 2012-06-21 SURGICAL SUPPLY — 53 items
BLADE SURG ROTATE 9660 (MISCELLANEOUS) IMPLANT
BUR EGG ELITE 4.0 (BURR) IMPLANT
BUR MATCHSTICK NEURO 3.0 LAGG (BURR) IMPLANT
CANISTER SUCTION 2500CC (MISCELLANEOUS) ×2 IMPLANT
CLOTH BEACON ORANGE TIMEOUT ST (SAFETY) ×2 IMPLANT
CLSR STERI-STRIP ANTIMIC 1/2X4 (GAUZE/BANDAGES/DRESSINGS) ×2 IMPLANT
CORDS BIPOLAR (ELECTRODE) ×2 IMPLANT
COVER SURGICAL LIGHT HANDLE (MISCELLANEOUS) ×2 IMPLANT
CRADLE DONUT ADULT HEAD (MISCELLANEOUS) ×2 IMPLANT
DRAPE C-ARM 42X72 X-RAY (DRAPES) ×4 IMPLANT
DRAPE POUCH INSTRU U-SHP 10X18 (DRAPES) ×2 IMPLANT
DRAPE SURG 17X23 STRL (DRAPES) ×2 IMPLANT
DRAPE U-SHAPE 47X51 STRL (DRAPES) ×2 IMPLANT
DRSG MEPILEX BORDER 4X4 (GAUZE/BANDAGES/DRESSINGS) ×2 IMPLANT
DURAPREP 26ML APPLICATOR (WOUND CARE) ×2 IMPLANT
ELECT COATED BLADE 2.86 ST (ELECTRODE) ×2 IMPLANT
ELECT REM PT RETURN 9FT ADLT (ELECTROSURGICAL) ×2
ELECTRODE REM PT RTRN 9FT ADLT (ELECTROSURGICAL) ×1 IMPLANT
GLOVE BIOGEL PI IND STRL 8.5 (GLOVE) ×1 IMPLANT
GLOVE BIOGEL PI INDICATOR 8.5 (GLOVE) ×1
GLOVE ECLIPSE 8.5 STRL (GLOVE) ×2 IMPLANT
GOWN PREVENTION PLUS XXLARGE (GOWN DISPOSABLE) ×2 IMPLANT
GOWN STRL REIN XL XLG (GOWN DISPOSABLE) ×4 IMPLANT
KIT BASIN OR (CUSTOM PROCEDURE TRAY) ×2 IMPLANT
KIT ROOM TURNOVER OR (KITS) ×2 IMPLANT
NEEDLE SPNL 18GX3.5 QUINCKE PK (NEEDLE) ×2 IMPLANT
NS IRRIG 1000ML POUR BTL (IV SOLUTION) IMPLANT
PACK ORTHO CERVICAL (CUSTOM PROCEDURE TRAY) ×2 IMPLANT
PACK UNIVERSAL I (CUSTOM PROCEDURE TRAY) ×2 IMPLANT
PAD ARMBOARD 7.5X6 YLW CONV (MISCELLANEOUS) ×4 IMPLANT
PATTIES SURGICAL .25X.25 (GAUZE/BANDAGES/DRESSINGS) IMPLANT
PIN DISTRACTION 14 (PIN) ×4 IMPLANT
PLATE SKYLINE 12MM (Plate) ×2 IMPLANT
PUTTY DBX 2.5CC (Putty) ×2 IMPLANT
PUTTY DBX 2.5CC DEPUY (Putty) ×1 IMPLANT
RESTRAINT LIMB HOLDER UNIV (RESTRAINTS) ×2 IMPLANT
SCREW SELF DRILL SKYLINE 12MM (Screw) ×4 IMPLANT
SCREW SKYLINE 14MM SD-VA (Screw) ×4 IMPLANT
SPACER ADV ACF LORODTIC 8MM (Bone Implant) ×2 IMPLANT
SPONGE INTESTINAL PEANUT (DISPOSABLE) ×2 IMPLANT
SPONGE SURGIFOAM ABS GEL 100 (HEMOSTASIS) ×2 IMPLANT
SURGIFLO TRUKIT (HEMOSTASIS) IMPLANT
SUT MNCRL AB 3-0 PS2 18 (SUTURE) ×2 IMPLANT
SUT SILK 2 0 (SUTURE)
SUT SILK 2-0 18XBRD TIE 12 (SUTURE) IMPLANT
SUT VIC AB 2-0 CT1 18 (SUTURE) ×2 IMPLANT
SYR BULB IRRIGATION 50ML (SYRINGE) ×2 IMPLANT
SYR CONTROL 10ML LL (SYRINGE) ×2 IMPLANT
TAPE CLOTH 4X10 WHT NS (GAUZE/BANDAGES/DRESSINGS) ×2 IMPLANT
TAPE UMBILICAL COTTON 1/8X30 (MISCELLANEOUS) ×2 IMPLANT
TOWEL OR 17X24 6PK STRL BLUE (TOWEL DISPOSABLE) ×2 IMPLANT
TOWEL OR 17X26 10 PK STRL BLUE (TOWEL DISPOSABLE) ×2 IMPLANT
WATER STERILE IRR 1000ML POUR (IV SOLUTION) ×2 IMPLANT

## 2012-06-21 NOTE — Anesthesia Postprocedure Evaluation (Signed)
Anesthesia Post Note  Patient: [name] [name]  Procedure(s) Performed: Procedure(s) (LRB): ANTERIOR CERVICAL DECOMPRESSION/DISCECTOMY FUSION C-4 - C5 (SPACER/DePUY CERVICAL PLATE ONLY) 1 LEVEL (N/A)  Anesthesia type: General  Patient location: PACU  Post pain: Pain level controlled  Post assessment: Patient's Cardiovascular Status Stable  Last Vitals:  Filed Vitals:   06/21/12 1030  BP: 129/71  Pulse: 93  Temp:   Resp: 21    Post vital signs: Reviewed and stable  Level of consciousness: alert  Complications: No apparent anesthesia complications

## 2012-06-21 NOTE — H&P (Signed)
History of Present Illness The patient is a 57 year old female who presents today for follow up of their neck. The patient is being followed for their right-sided neck pain. The following medication has been used for pain control: Hydrocodone (prn). Note for "Follow-up Neck": discuss surgery    Subjective Transcription  She presents today for evaluation. She continues to have significant neck and radicular right C5 pain. She is also having dysesthesias.    Allergies Codeine Sulfate *ANALGESICS - OPIOID* Cortisone *CORTICOSTEROIDS* CODEINE. 10/13/1999 (Marked as Inactive) Cortisone. 10/01/1997 (Marked as Inactive)   Social History Tobacco use. Never smoker. never smoker   Medication History Norco (5-325MG  Tablet, 1 Oral 1 tab po q 4-6 hrs prn pain, Taken starting 01/10/2012) Active. Amitriptyline HCl (50MG  Tablet, Oral) Active. ZyrTEC ( Oral) Specific dose unknown - Active. Omeprazole (40MG  Capsule DR, Oral) Active. Astepro (0.15% Solution, Nasal) Active. Nasonex (50MCG/ACT Suspension, Nasal) Active. Bepreve (1.5% Solution, Ophthalmic) Active. Furosemide (20MG  Tablet, Oral) Active. Sertraline HCl (50MG  Tablet, Oral) Active. Valtrex ( Oral) Specific dose unknown - Active. Myrbetriq ( Oral) Specific dose unknown - Active. Paxil (30MG  Tablet, Oral) Active. Neurontin (300MG  Capsule, Oral) Active.   Objective Transcription  She is a pleasant woman who appears younger than her stated age. She is alert and oriented x3. No shortness of breath or chest pain. The abdomen is soft and nontender. No history of incontinence of bowel or bladder except for preexisting bladder issue for which she takes medications. She does have more of a stress incontinence secondary to a weight loss surgery which is being addressed. She has 4/5 deltoid strength on the right and 4+/5 biceps strength on the right. The remainder of her upper extremity strength is 5/5. The left is  5/5. Symmetrical deep tendon reflexes. She has diminished sensation to light touch over the C5 distribution in the upper extremity. Negative Hoffman sign. Normal gait pattern. No shortness of breath or chest pain. The abdomen is soft and nontender. Lungs are clear to auscultation.    RADIOGRAPHS:  The MRI from 01/17/12 was reviewed. It demonstrates significant foraminal compromise from hard disc osteophyte at C4-C5. There is also a mention of a left lobe thyroid nodule which she is aware of and it has been biopsied and appropriately worked up. There is no cord signal change. There is loss of normal cervical lordosis. Degenerative disc disease at 5-6, 6-7, and 7-1 but no significant central or foraminal stenosis. At C4-C5 there is severe right foraminal stenosis. At C3-C4 there is disc osteophyte causing moderate to severe left disease but no cord signal change and mild to moderate foraminal stenosis. At this point in time we have had an opportunity to review the MRI together and we have discussed treatment options. This can begin with physical therapy and injection therapy. She has had physical therapy in the past and this has not been helpful. She has expressed an interest in surgery. To address this I think the most effective way is through an anterior cervical discectomy fusion of just the C4-C5 level. She is having C5 symptomatic symptoms on both motor exam and sensory exam and she has severe disease on the right side at 4-5 causing nerve irritation from the C5 nerve root.    Plans Transcription  At this point, even though there is an issue at 3-4 clinically she is not having significant left sided axial neck pain. That would make me think the left C4 nerve root is part of this symptomatology. We have reviewed the  surgery which would be an anterior cervical discectomy fusion at C4-C5. We have reviewed the risks which include infection, bleeding, nerve damage, death, stroke,  paralysis, failure to heal, need for further surgery, adjacent segment disease, throat pain, swallowing difficulties, hoarseness in the voice, nonunion, need for further surgery including posterior surgery. All of her questions were encouraged and addressed. I have given her some literature to review about an anterior cervical discectomy fusion. We will go ahead and begin the process.

## 2012-06-21 NOTE — Brief Op Note (Signed)
06/21/2012  9:34 AM  PATIENT:  [name] [name]  57 y.o. female  PRE-OPERATIVE DIAGNOSIS:  C4 - C5 Radiculopathy Stenosis  POST-OPERATIVE DIAGNOSIS:  C4 - C5 Radiculopathy Stenosis  PROCEDURE:  Procedure(s): ANTERIOR CERVICAL DECOMPRESSION/DISCECTOMY FUSION C-4 - C5 (SPACER/DePUY CERVICAL PLATE ONLY) 1 LEVEL (N/A)  SURGEON:  Surgeon(s) and Role:    * Venita Lick, MD - Primary  PHYSICIAN ASSISTANT:   ASSISTANTS: none   ANESTHESIA:   general  EBL:  Total I/O In: 1000 [I.V.:1000] Out: 10 [Blood:10]  BLOOD ADMINISTERED:none  DRAINS: none   LOCAL MEDICATIONS USED:  MARCAINE     SPECIMEN:  No Specimen  DISPOSITION OF SPECIMEN:  N/A  COUNTS:  YES  TOURNIQUET:  * No tourniquets in log *  DICTATION: .Other Dictation: Dictation Number [id number]  PLAN OF CARE: Admit for overnight observation  PATIENT DISPOSITION:  PACU - hemodynamically stable.

## 2012-06-21 NOTE — Preoperative (Signed)
Beta Blockers   Reason not to administer Beta Blockers:Not Applicable. No home beta blockers 

## 2012-06-21 NOTE — Transfer of Care (Signed)
Immediate Anesthesia Transfer of Care Note  Patient: KYRAH SCHIRO  Procedure(s) Performed: Procedure(s): ANTERIOR CERVICAL DECOMPRESSION/DISCECTOMY FUSION C-4 - C5 (SPACER/DePUY CERVICAL PLATE ONLY) 1 LEVEL (N/A)  Patient Location: PACU  Anesthesia Type:General  Level of Consciousness: awake, alert  and oriented  Airway & Oxygen Therapy: Patient Spontanous Breathing and Patient connected to nasal cannula oxygen  Post-op Assessment: Report given to PACU RN and Post -op Vital signs reviewed and stable  Post vital signs: Reviewed and stable  Complications: No apparent anesthesia complications

## 2012-06-21 NOTE — Anesthesia Preprocedure Evaluation (Addendum)
Anesthesia Evaluation  Patient identified by MRN, date of birth, ID band Patient awake    Reviewed: Allergy & Precautions, H&P , NPO status , Patient's Chart, lab work & pertinent test results, reviewed documented beta blocker date and time   Airway Mallampati: III TM Distance: >3 FB Neck ROM: full    Dental  (+) Teeth Intact and Dental Advisory Given   Pulmonary neg pulmonary ROS,  breath sounds clear to auscultation        Cardiovascular hypertension, On Medications + Peripheral Vascular Disease negative cardio ROS  + dysrhythmias Rhythm:regular     Neuro/Psych Anxiety  Neuromuscular disease negative psych ROS   GI/Hepatic Neg liver ROS, GERD-  Medicated and Controlled,  Endo/Other  negative endocrine ROSMorbid obesity  Renal/GU negative Renal ROS  negative genitourinary   Musculoskeletal   Abdominal (+) + obese,   Peds  Hematology  (+) anemia ,   Anesthesia Other Findings See surgeon's H&P   Reproductive/Obstetrics negative OB ROS                          Anesthesia Physical Anesthesia Plan  ASA: III  Anesthesia Plan: General   Post-op Pain Management:    Induction: Intravenous  Airway Management Planned: Oral ETT and Video Laryngoscope Planned  Additional Equipment:   Intra-op Plan:   Post-operative Plan: Extubation in OR  Informed Consent: I have reviewed the patients History and Physical, chart, labs and discussed the procedure including the risks, benefits and alternatives for the proposed anesthesia with the patient or authorized representative who has indicated his/her understanding and acceptance.   Dental Advisory Given and Dental advisory given  Plan Discussed with: CRNA and Surgeon  Anesthesia Plan Comments:        Anesthesia Quick Evaluation

## 2012-06-22 ENCOUNTER — Encounter (HOSPITAL_COMMUNITY): Payer: Self-pay | Admitting: Orthopedic Surgery

## 2012-06-22 NOTE — Discharge Summary (Signed)
Patient ID: [name] [name] MRN: [id number] DOB/AGE: 08-21-1955 57 y.o.  Admit date: 06/21/2012 Discharge date: 06/22/2012  Admission Diagnoses:  Active Problems:   * No active hospital problems. *   Discharge Diagnoses:  Active Problems:   * No active hospital problems. *  status post Procedure(s): ANTERIOR CERVICAL DECOMPRESSION/DISCECTOMY FUSION C-4 - C5 (SPACER/DePUY CERVICAL PLATE ONLY) 1 LEVEL  Past Medical History  Diagnosis Date  . GERD (gastroesophageal reflux disease)   . Allergy   . Osteoporosis   . Obesity     status post bariatric procedure in [facility] 2005  . Elevated alkaline phosphatase level     negative workup (presumed secondary to fatty liver)  . Thyroid nodule 7/10    `  . Interstitial cystitis     Dr Normajean Baxter  . 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 Thyroid nodule   . Anxiety   . Varicose vein of leg   . Neuromuscular disorder     rt arm carpal tunnel syndrome  . Frequency-urgency syndrome   . Arthritis   . Anemia     takes iron supplement    Surgeries: Procedure(s): ANTERIOR CERVICAL DECOMPRESSION/DISCECTOMY FUSION C-4 - C5 (SPACER/DePUY CERVICAL PLATE ONLY) 1 LEVEL on 06/21/2012   Consultants:  none  Discharged Condition: Improved  Hospital Course: MARCHELL FROMAN is an 57 y.o. female who was admitted 06/21/2012 for operative treatment of <principal problem not specified>. Patient failed conservative treatments (please see the history and physical for the specifics) and had severe unremitting pain that affects sleep, daily activities and work/hobbies. After pre-op clearance, the patient was taken to the operating room on 06/21/2012 and underwent  Procedure(s): ANTERIOR CERVICAL DECOMPRESSION/DISCECTOMY FUSION C-4 - C5 (SPACER/DePUY CERVICAL PLATE ONLY) 1 LEVEL.    Patient was given perioperative antibiotics: Anti-infectives   Start     Dose/Rate Route Frequency Ordered Stop   06/21/12 1530  ceFAZolin (ANCEF) IVPB 1 g/50 mL premix     1  g 100 mL/hr over 30 Minutes Intravenous Every 8 hours 06/21/12 1340 06/21/12 2325   06/20/12 1417  ceFAZolin (ANCEF) IVPB 2 g/50 mL premix     2 g 100 mL/hr over 30 Minutes Intravenous 30 min pre-op 06/20/12 1417 06/21/12 0728       Patient was given sequential compression devices and early ambulation to prevent DVT.   Patient benefited maximally from hospital stay and there were no complications. At the time of discharge, the patient was urinating/moving their bowels without difficulty, tolerating a regular diet, pain is controlled with oral pain medications and they have been cleared by PT/OT.   Recent vital signs: Patient Vitals for the past 24 hrs:  BP Temp Pulse Resp SpO2  06/22/12 0655 132/68 mmHg 97.1 F (36.2 C) 56 18 100 %  06/21/12 2109 133/74 mmHg 98.6 F (37 C) 92 16 99 %  06/21/12 1300 120/62 mmHg 98 F (36.7 C) 91 12 96 %  06/21/12 1215 131/71 mmHg - 90 10 98 %  06/21/12 1200 118/73 mmHg 98 F (36.7 C) 91 17 97 %  06/21/12 1145 116/69 mmHg - 95 14 96 %  06/21/12 1130 122/64 mmHg - 89 16 96 %  06/21/12 1115 121/63 mmHg - 90 15 98 %  06/21/12 1100 125/64 mmHg - 96 13 96 %  06/21/12 1045 125/70 mmHg - 89 17 97 %  06/21/12 1030 129/71 mmHg - 93 21 98 %  06/21/12 1015 127/73 mmHg - 92 17 95 %  06/21/12 1000 144/74 mmHg 98 F (36.7 C) 94 23 96 %     Recent laboratory studies: No results found for this basename: WBC, HGB, HCT, PLT, NA, K, CL, CO2, BUN, CREATININE, GLUCOSE, PT, INR, CALCIUM, 2,  in the last 72 hours   Discharge Medications:     Medication List    TAKE these medications       ALPRAZolam 0.5 MG tablet  Commonly known as:  XANAX  Take 1 tablet (0.5 mg total) by mouth 2 (two) times daily.     amitriptyline 100 MG tablet  Commonly known as:  ELAVIL  Take 100 mg by mouth at bedtime.     ASTEPRO 0.15 % Soln  Generic drug:  Azelastine HCl  1 spray by Each Nare route 2 (two) times daily.     BEPREVE 1.5 % Soln  Generic drug:  Bepotastine Besilate   Apply 1 drop to eye 2 (two) times daily as needed (allergies).     cetirizine 10 MG tablet  Commonly known as:  ZYRTEC  Take 10 mg by mouth daily.     docusate sodium 100 MG capsule  Commonly known as:  COLACE  Take 1 capsule (100 mg total) by mouth 3 (three) times daily as needed for constipation.     ferrous sulfate 325 (65 FE) MG tablet  Take 1 tablet (325 mg total) by mouth 2 (two) times daily.     Fish Oil 1000 MG Caps  Take 1 capsule by mouth 2 (two) times daily.     furosemide 20 MG tablet  Commonly known as:  LASIX  Take 20 mg by mouth as needed.     HYDROcodone-acetaminophen 10-325 MG per tablet  Commonly known as:  NORCO  Take 1 tablet by mouth every 6 (six) hours as needed for pain.     methocarbamol 500 MG tablet  Commonly known as:  ROBAXIN  Take 1 tablet (500 mg total) by mouth 3 (three) times daily as needed.     mometasone 50 MCG/ACT nasal spray  Commonly known as:  NASONEX  Place 1 spray into the nose 2 (two) times daily.     multivitamin tablet  Take 1 tablet by mouth 2 (two) times daily.     MYRBETRIQ 50 MG Tb24  Generic drug:  mirabegron ER  Take 50 mg by mouth daily.     omeprazole 40 MG capsule  Commonly known as:  PRILOSEC  Take 40 mg by mouth 2 (two) times daily.     ondansetron 4 MG tablet  Commonly known as:  ZOFRAN  Take 1 tablet (4 mg total) by mouth every 8 (eight) hours as needed for nausea.     PARoxetine 30 MG tablet  Commonly known as:  PAXIL  Take 1 tablet (30 mg total) by mouth every morning.     polyethylene glycol powder powder  Commonly known as:  GLYCOLAX  Take 17 g by mouth daily.     TUMS ULTRA 1000 400 MG tablet  Generic drug:  calcium elemental as carbonate  Chew 1,000 mg by mouth 2 (two) times daily.     valACYclovir 500 MG tablet  Commonly known as:  VALTREX  Take 500 mg by mouth daily as needed.     VITAMIN A PO  Take 1 tablet by mouth daily.     VITAMIN B6 PO  Take 1 tablet by mouth daily.      Vitamin D 2000 UNITS Caps  Take 2,000 Units by  mouth 2 (two) times daily.        Diagnostic Studies: Dg Cervical Spine 2-3 Views  06/21/2012  *RADIOLOGY REPORT*  Clinical Data: Neck pain  DG C-ARM 1-60 MIN,CERVICAL SPINE - 2-3 VIEW  Technique:  C-arm fluoroscopic images were obtained intraoperatively and submitted for postoperative interpretation. Please see the performing provider's procedural report for the fluoroscopy time utilized.  Comparison: 06/18/2012  Findings: C-arm films document C4-5 ACDF.  Satisfactory position and alignment.  IMPRESSION: As above.   Original Report Authenticated By: Davonna Belling, M.D.    Dg Cervical Spine 2 Or 3 Views  06/21/2012  *RADIOLOGY REPORT*  Clinical Data: Neck pain after cervical fusion.  CERVICAL SPINE - 2-3 VIEW  Comparison: Intraoperative fluoroscopic spot views this same date.  Findings: Anterior plate and screws and interbody spacer are in place at C4-5.  Vertebral body height and alignment are normal. Hardware is intact.  Loss of disc space height C5-6 and C6-7 noted.  IMPRESSION: Status post C4-5 ACDF without evidence of complication.  No acute finding.   Original Report Authenticated By: Holley Dexter, M.D.    Dg Cervical Spine 2 Or 3 Views  06/18/2012  *RADIOLOGY REPORT*  Clinical Data: Preoperative radiographs prior to cervical fusion  CERVICAL SPINE - 2-3 VIEW  Comparison: None.  Findings: Frontal and lateral radiographs of the cervical spine demonstrate no acute fracture or malalignment.  Vertebral body heights are maintained.  There is mild multilevel cervical spondylosis with the most significant level of disc space narrowing at C6-C7.  No prevertebral swelling.  No focal soft tissue abnormality.  The lung apices are clear.  IMPRESSION:  Multilevel cervical spondylosis as above.   Original Report Authenticated By: Malachy Moan, M.D.    Mm Digital Screening  06/07/2012  *RADIOLOGY REPORT*  Clinical Data: Screening.  DIGITAL BILATERAL SCREENING  MAMMOGRAM WITH CAD  Comparison:  Previous exams.  FINDINGS:  ACR Breast Density Category 2: There is a scattered fibroglandular pattern.  No suspicious masses, architectural distortion, or calcifications are present.  Images were processed with CAD.  IMPRESSION: No mammographic evidence of malignancy.  A result letter of this screening mammogram will be mailed directly to the patient.  RECOMMENDATION: Screening mammogram in one year. (Code:SM-B-01Y)  BI-RADS CATEGORY 1:  Negative.   Original Report Authenticated By: Beckie Salts, M.D.    Dg C-arm 1-60 Min  06/21/2012  *RADIOLOGY REPORT*  Clinical Data: Neck pain  DG C-ARM 1-60 MIN,CERVICAL SPINE - 2-3 VIEW  Technique:  C-arm fluoroscopic images were obtained intraoperatively and submitted for postoperative interpretation. Please see the performing provider's procedural report for the fluoroscopy time utilized.  Comparison: 06/18/2012  Findings: C-arm films document C4-5 ACDF.  Satisfactory position and alignment.  IMPRESSION: As above.   Original Report Authenticated By: Davonna Belling, M.D.          Future Appointments Provider Department Dept Phone   09/19/2012 8:00 AM Sandford Craze, NP [facility]  [facility] [phone]      Follow-up Information   Follow up with Alvy Beal, MD. Call in 2 weeks.   Contact information:   [address] [place] [address] [phone]       Discharge Plan:  discharge to home  Disposition: stable    Signed: Venita Lick D for Dr. Venita Lick [facility] [phone] 06/22/2012, 7:06 AM

## 2012-06-22 NOTE — Care Management Note (Signed)
CARE MANAGEMENT NOTE 06/22/2012  Patient:  [name] [name], [name] [name]   Account Number:  [id number]  Date Initiated:  06/22/2012  Documentation initiated by:  Vance Peper  Subjective/Objective Assessment:   56 yr old female s/p ACDF     Action/Plan:   Patient has no home health or DME needs.   Anticipated DC Date:  06/15/2012   Anticipated DC Plan:  HOME/SELF CARE      DC Planning Services  CM consult      Choice offered to / List presented to:             Status of service:  Completed, signed off Medicare Important Message given?   (If response is "NO", the following Medicare IM given date fields will be blank) Date Medicare IM given:   Date Additional Medicare IM given:    Discharge Disposition:  HOME/SELF CARE  Per UR Regulation:    If discussed at Long Length of Stay Meetings, dates discussed:    Comments:

## 2012-06-22 NOTE — Op Note (Signed)
[name] [name], [name] [name] NO.:  [id number]  MEDICAL RECORD NO.:  [id number]  LOCATION:  [id number]                        FACILITY:  [facility]  PHYSICIAN:  Alvy Beal, MD    DATE OF BIRTH:  October 21, 1955  DATE OF PROCEDURE: DATE OF DISCHARGE:                              OPERATIVE REPORT   PREOPERATIVE DIAGNOSIS:  Cervical spondylitic radiculopathy, C4-5.  POSTOPERATIVE DIAGNOSIS:  Cervical spondylitic radiculopathy, C4-5.  OPERATIVE PROCEDURE:  Anterior cervical diskectomy and fusion, C4-5.  COMPLICATIONS:  None.  CONDITION:  Stable.  It should be noted that the patient's consent form was anterior cervical diskectomy and fusion from C4-C6.  However, this was an error, that was discovered after the patient went to sleep.  Upon review of the MRIs in my office charts, it was very clear that she only had single-level pathology that required surgery.  I notated this with the circulating nurse that the patient should have been consented just for the single level.  HISTORY:  This is a very pleasant woman who has been having significant neck and radicular C5 arm pain on the right side.  Attempts at conservative management have failed to alleviate her symptoms and she continue to deteriorate.  As a result of the ongoing severe pain, we elected to proceed with surgery.  The patient had excellent result with the C5.  All appropriate risks, benefits, and alternatives were discussed with the patient.  Consent was obtained.  OPERATIVE NOTE:  The patient was brought to the operating room and placed supine on the operating table.  After successful induction of general anesthesia and endotracheal intubation, TEDs and SCDs were applied.  Inflatable cuff was placed between the shoulder blades and the anterior cervical spine was prepped and draped in usual standard fashion.  Time-out was taken to confirm the patient, procedure and all other pertinent important data.  Once this was  completed, the x-ray was brought sterilely into the field and I identified the C4-5 disk space. The proposed incision site was then infiltrated with 0.25% Marcaine.  A left-sided transverse incision was made in the skin fold and sharp dissection was carried out down to the deep fascia.  The platysma was identified and incised and then I identified the medial border of the sternocleidomastoid muscle.  I then dissected along this sharply through the deep cervical and bluntly through the prevertebral fascia.  I then mobilized the trachea and esophagus and then protected it with a retractor and then palpated the carotid sheath and protected that with a finger.  I then identified the anterior longitudinal ligament and the C4- 5 disk space.  A needle was placed into the disk space.  I took an x-ray and confirmed that I was at the appropriate level.  Once this was done, I mobilized the longus coli muscles from the midbody of C4 to the midbody of C5.  Self-retaining retractor blades were placed underneath the longus coli muscle.  The endotracheal cuff was deflated.  I expanded the retractors to the appropriate width and reinflated the cuff.  I then performed an annulotomy with a 15-blade scalpel.  Using pituitary rongeurs, I removed the bulk of the disk material.  I then took the  2-mm Kerrison and resected the overhanging osteophyte from the inferior aspect of the C4 vertebral body.  I then placed distraction pins into the bodies of C4 and C5, and distracted the space.  Once this was done, I then continued using curettes to resect all the disk material.  Once I was at the posterior anulus, I used a #1 Kerrison to resect the posterior aspect of the posterior overhanging osteophyte and the annulus.  I then used a fine nerve hook to gently dissect through the posterior longitudinal ligament.  Once I was able to get plane between the posterior longitudinal ligament and the thecal sac, I used my 1-mm  Kerrison to resect the posterior longitudinal ligament.  There was a significant large hard disk osteophyte complex on the posterolateral right side, which I was able to debride using a 1-mm Kerrison.  At this point, I had an excellent diskectomy.  I could see the anterior aspect of the thecal sac.  I could pass my nerve hook underneath the posterior aspect of the vertebral bodies and underneath the uncovertebral joint.  I then used a larger curette to ensure I had removed all of the cartilage from the cartilaginous endplate.  I then rasped the endplates until I had bleeding subchondral bone.  Once this was done, I copiously irrigated the wound with normal saline and then trialed with a 7 and 8-mm lordotic allograft spacer.  The 8 provided an excellent fit and so, I placed the DBX inside the allograft and then malleted it to the appropriate depth.  Once I was down to the appropriate depth, x-rays demonstrated satisfactory position.  I then removed the distraction pins and placed bone wax in the holes and then placed a 12-mm anterior cervical Skyline DePuy plate, affixed it to the body of C4 with 14-mm screws and 12-mm screws into the body of C5.  All screws had excellent purchase and I then locked it according to manufacturer's standards with the torqued it down.  I then removed the retracting blades.  I irrigated the wound copiously with normal saline and made sure I had hemostasis.  I then returned the trachea and esophagus to midline, and then closed the platysma with interrupted 2-0 Vicryl sutures and a 3-0 Monocryl for the skin.  Steri-Strips, dry dressing and a collar were applied.  The patient was extubated, transferred to the PACU without incident.  At the end of the case, all needle and sponge counts were correct.     Alvy Beal, MD     DDB/MEDQ  D:  06/21/2012  T:  06/22/2012  Job:  [id number]

## 2012-06-22 NOTE — Progress Notes (Signed)
    Subjective: Procedure(s) (LRB): ANTERIOR CERVICAL DECOMPRESSION/DISCECTOMY FUSION C-4 - C5 (SPACER/DePUY CERVICAL PLATE ONLY) 1 LEVEL (N/A) 1 Day Post-Op  Patient reports pain as 2 on 0-10 scale.  Reports decreased arm pain reports incisional neck pain   Positive void Negative bowel movement Positive flatus Negative chest pain or shortness of breath  Objective: Vital signs in last 24 hours: Temp:  [97.1 F (36.2 C)-98.6 F (37 C)] 97.1 F (36.2 C) (05/09 0655) Pulse Rate:  [75-96] 75 (05/09 0655) Resp:  [10-23] 18 (05/09 0655) BP: (116-144)/(62-74) 132/68 mmHg (05/09 0655) SpO2:  [95 %-100 %] 100 % (05/09 0655)  Intake/Output from previous day: 05/08 0701 - 05/09 0700 In: 1950 [I.V.:1650; IV Piggyback:300] Out: 1110 [Urine:1100; Blood:10]  Labs: No results found for this basename: WBC, RBC, HCT, PLT,  in the last 72 hours No results found for this basename: NA, K, CL, CO2, BUN, CREATININE, GLUCOSE, CALCIUM,  in the last 72 hours No results found for this basename: LABPT, INR,  in the last 72 hours  Physical Exam: Neurologically intact ABD soft Neurovascular intact Incision: dressing C/D/I and no drainage Compartment soft  Assessment/Plan: Patient stable  xrays satisfactory Mobilization with physical therapy Encourage incentive spirometry Continue care  Advance diet Up with therapy D/C IV fluids Plan on d/c today.  Spontaneous void noted.  No focal deficits.   F/u 2 weeks   Venita Lick, MD [facility] [phone]

## 2012-06-22 NOTE — Evaluation (Signed)
Occupational Therapy Evaluation Patient Details Name: [name] [name] MRN: [id number] DOB: Oct 02, 1955 Today's Date: 06/22/2012 Time: 1050-1109 OT Time Calculation (min): 19 min  OT Assessment / Plan / Recommendation Clinical Impression    Pt. is s/p ACDF C4-5 and at supervision level due to cues to maintain precautions. Education has been completed to pt and significant other. Will sign off due to pt d/c today.       OT Assessment  Patient does not need any further OT services    Follow Up Recommendations  No OT follow up;Supervision - Intermittent    Barriers to Discharge      Equipment Recommendations  None recommended by OT    Recommendations for Other Services    Frequency       Precautions / Restrictions Precautions Precautions: Cervical Precaution Comments: reviewed handout and precautions Required Braces or Orthoses: Cervical Brace Cervical Brace: Hard collar Restrictions Weight Bearing Restrictions: No   Pertinent Vitals/Pain Pain 6/10. Premedicated.    ADL  Upper Body Dressing: Supervision/safety;Simulated Where Assessed - Upper Body Dressing: Supported sitting Lower Body Dressing: Supervision/safety Where Assessed - Lower Body Dressing: Supported sitting Toilet Transfer: Supervision/safety Toilet Transfer Method: Sit to Barista: Regular height toilet;Grab bars Equipment Used: Gait belt;Other (comment) (cervical collar) ADL Comments: Supervision due to cues to maintain precautions. Practiced donning/doffing collar and educated on changing pads. Recommended long handled sponge and getting shower chair to be able to wash feet. Significant other explained that he will help her with this. Pt had already gotten dressed before OT arrived. Practiced donning/doffing socks.  Educated on how to wash hair in shower. Pt and significant other had no other concerns before d/c.    OT Diagnosis:    OT Problem List:   OT Treatment Interventions:      OT Goals    Visit Information  Last OT Received On: 06/22/12 Assistance Needed: +1    Subjective Data      Prior Functioning     Home Living Lives With: Significant other;Daughter Available Help at Discharge: Family;Available 24 hours/day Type of Home: House Home Access: Stairs to enter [employer] of Steps: 1 Entrance Stairs-Rails: None Home Layout: Two level;Full bath on main level;Able to live on main level with bedroom/bathroom Bathroom Shower/Tub: Walk-in shower;Door [employer] Toilet: Standard Home Adaptive Equipment: Environmental consultant - rolling;Grab bars in shower;Grab bars around toilet Prior Function Level of Independence: Independent Able to Take Stairs?: Yes Driving: Yes Vocation: Full time employment Communication Communication: No difficulties Dominant Hand: Right         Vision/Perception     Cognition  Cognition Arousal/Alertness: Lethargic Behavior During Therapy: WFL for tasks assessed/performed Overall Cognitive Status: Within Functional Limits for tasks assessed    Extremity/Trunk Assessment Right Upper Extremity Assessment RUE ROM/Strength/Tone: WFL for tasks assessed RUE Sensation: Deficits RUE Sensation Deficits: pt. reports slight numbness on dorsum of right forearm but states much better than prior to surgery Left Upper Extremity Assessment LUE ROM/Strength/Tone: WFL for tasks assessed LUE Sensation: WFL - Light Touch    Mobility Bed Mobility Bed Mobility: Supine to Sit;Sitting - Scoot to Edge of Bed Supine to Sit: 5: Supervision Sitting - Scoot to Edge of Bed: 6: Modified independent (Device/Increase time) Details for Bed Mobility Assistance: Pt able to maintain precautions. educated that log rolling technique is recommended and easier to maintain precautions.  Transfers Transfers: Sit to Stand;Stand to Sit Sit to Stand: 5: Supervision Stand to Sit: 5: Supervision Details for Transfer Assistance: cues to maintain  precautions.       Exercise     Balance     End of Session OT - End of Session Equipment Utilized During Treatment: Gait belt;Cervical collar Activity Tolerance: Patient tolerated treatment well Patient left: with family/visitor present Nurse Communication: Mobility status  GO Functional Assessment Tool Used: clinical judgment  Functional Limitation: Self care Self Care Current Status ([id number]): At least 1 percent but less than 20 percent impaired, limited or restricted Self Care Discharge Status [phone]): At least 1 percent but less than 20 percent impaired, limited or restricted   Earlie Raveling OTR/L [phone] 06/22/2012, 11:41 AM

## 2012-06-22 NOTE — Evaluation (Signed)
Physical Therapy Evaluation Patient Details Name: [name] [name] MRN: [id number] DOB: [birth year] Today's Date: 06/22/2012 Time: 7829-5621 PT Time Calculation (min): 27 min  PT Assessment / Plan / Recommendation Clinical Impression  Pt. is s/p ACDF C4-5 and is at mod I level with her bed mobility and transfers/gait. Education has been completed and she is DCing home this am with 24 hour care.  Will sign off due to pending DC.    PT Assessment  Patent does not need any further PT services    Follow Up Recommendations  No PT follow up    Does the patient have the potential to tolerate intense rehabilitation      Barriers to Discharge        Equipment Recommendations  None recommended by PT    Recommendations for Other Services     Frequency      Precautions / Restrictions Precautions Precautions: Cervical Precaution Comments: reviewed cervical precautions and provided handout Required Braces or Orthoses: Cervical Brace Cervical Brace: Hard collar Restrictions Weight Bearing Restrictions: No   Pertinent Vitals/Pain See vitals tab       Mobility  Bed Mobility Bed Mobility: Rolling Left;Left Sidelying to Sit Rolling Left: 6: Modified independent (Device/Increase time);With rail Left Sidelying to Sit: 6: Modified independent (Device/Increase time);HOB flat Details for Bed Mobility Assistance: cues for maintaining good neck alignment Transfers Transfers: Sit to Stand;Stand to Sit Sit to Stand: 6: Modified independent (Device/Increase time) Stand to Sit: 6: Modified independent (Device/Increase time) Details for Transfer Assistance: manages well on her won with increased time Ambulation/Gait Ambulation/Gait Assistance: 6: Modified independent (Device/Increase time) Ambulation Distance (Feet): 200 Feet Assistive device: None Ambulation/Gait Assistance Details: Pt. with increased lateral shift bilaterally which is normal for her, no overt LOB and no unsteadiness  noted. Gait Pattern: Step-through pattern Stairs: No    Exercises     PT Diagnosis:    PT Problem List:   PT Treatment Interventions:     PT Goals    Visit Information  Last PT Received On: 06/22/12 Assistance Needed: +1    Subjective Data  Subjective: Pt reports she is leaving this am and that her boyfriend and daughter will be with her 24/7 initially Patient Stated Goal: return to work   Prior Functioning  Home Living Lives With: Significant other;Daughter Available Help at Discharge: Family;Available 24 hours/day Type of Home: House Home Access: Stairs to enter [employer] of Steps: 1 Entrance Stairs-Rails: None Home Layout: Two level;Full bath on main level;Able to live on main level with bedroom/bathroom Bathroom Shower/Tub: Walk-in shower;Door [employer] Toilet: Standard Home Adaptive Equipment: Environmental consultant - rolling;Grab bars in shower;Grab bars around toilet Prior Function Level of Independence: Independent Able to Take Stairs?: Yes Driving: Yes Vocation: Full time employment Communication Communication: No difficulties Dominant Hand: Right    Cognition  Cognition Arousal/Alertness: Lethargic (suspect due to medicines, arouses easliy when name called) Behavior During Therapy: WFL for tasks assessed/performed Overall Cognitive Status: Within Functional Limits for tasks assessed    Extremity/Trunk Assessment Right Upper Extremity Assessment RUE ROM/Strength/Tone: WFL for tasks assessed RUE Sensation: Deficits RUE Sensation Deficits: pt. reports slight numbness on dorsum of right forearm but states much better than prior to surgery Left Upper Extremity Assessment LUE ROM/Strength/Tone: WFL for tasks assessed LUE Sensation: WFL - Light Touch Right Lower Extremity Assessment RLE ROM/Strength/Tone: WFL for tasks assessed Left Lower Extremity Assessment LLE ROM/Strength/Tone: [facility] for tasks assessed Trunk Assessment Trunk Assessment: Normal   Balance     End of Session  PT - End of Session Equipment Utilized During Treatment: Gait belt;Cervical collar Activity Tolerance: Patient tolerated treatment well Patient left: in chair;with call bell/phone within reach Nurse Communication: Mobility status  GP Functional Assessment Tool Used: clinical observation Functional Limitation: Mobility: Walking and moving around Mobility: Walking and Moving Around Current Status (W0981): At least 1 percent but less than 20 percent impaired, limited or restricted Mobility: Walking and Moving Around Goal Status [phone]): At least 1 percent but less than 20 percent impaired, limited or restricted Mobility: Walking and Moving Around Discharge Status [phone]): At least 1 percent but less than 20 percent impaired, limited or restricted   Ferman Hamming 06/22/2012, 10:09 AM Weldon Picking PT Acute Rehab Services [phone] Beeper [phone]

## 2012-06-22 NOTE — Progress Notes (Signed)
Patient discharged in stable condition via wheelchair to home. Discharge instructions and prescriptions were given and explained. 

## 2012-06-27 ENCOUNTER — Telehealth: Payer: Self-pay | Admitting: Family

## 2012-06-27 NOTE — Telephone Encounter (Signed)
Called [facility] to advise them of no refill prior to 5/20. Then after that #60 with no refills. Pharmacist stated that they will hold rx until 5/20.

## 2012-06-27 NOTE — Telephone Encounter (Signed)
Refill request for alprazolam Last filled by MD on - 06/04/12 #60 x0 Last seen- 06/15/12 F/U- 09/19/12 Pease advise refill?

## 2012-06-27 NOTE — Telephone Encounter (Signed)
D0 not refill prior to 5/20. Then ok to give #60 with no refills.

## 2012-06-27 NOTE — Telephone Encounter (Signed)
Refill- alprazolam 0.5mg  tablets. Take one tablet by mouth twice a day. Qty 60 last fill 4.21.14

## 2012-07-09 NOTE — Progress Notes (Signed)
patietn has no SNF needs. Clinical Social Worker will sign off for now as social work intervention is no longer needed. Please consult [place] again if new need arises.   Sabino Niemann, MSW, [phone]

## 2012-08-02 ENCOUNTER — Telehealth: Payer: Self-pay | Admitting: *Deleted

## 2012-08-02 NOTE — Telephone Encounter (Signed)
Left msg on vm stating she went to get a pedicure. Was told that she has a toe fungus. Wanting to know what she can use to clear up. Called pt back no answer LMOM can use anti-fungal cream over the counter, but if that don't clear fungus up will need to make appt to see Mellissa...lmb

## 2012-08-11 ENCOUNTER — Other Ambulatory Visit: Payer: Self-pay | Admitting: Obstetrics and Gynecology

## 2012-08-16 ENCOUNTER — Other Ambulatory Visit: Payer: Self-pay | Admitting: Internal Medicine

## 2012-08-16 MED ORDER — ALPRAZOLAM 0.5 MG PO TABS: 0.5000 mg | ORAL_TABLET | Freq: Two times a day (BID) | ORAL | Status: DC

## 2012-08-16 NOTE — Telephone Encounter (Signed)
Phoned Alprazolam to pharmacy/SLS

## 2012-08-16 NOTE — Telephone Encounter (Signed)
Refill request for Alprazolam  Last filled by MD on - 05.24.14, #60 x0  Last seen- 05.02.14  F/U- 08.06.14 Pease advise on refills/SLS

## 2012-09-14 ENCOUNTER — Other Ambulatory Visit: Payer: Self-pay | Admitting: Gynecology

## 2012-09-16 ENCOUNTER — Other Ambulatory Visit: Payer: Self-pay | Admitting: Gynecology

## 2012-09-17 ENCOUNTER — Telehealth: Payer: Self-pay | Admitting: *Deleted

## 2012-09-17 MED ORDER — ALPRAZOLAM 0.5 MG PO TABS: 0.5000 mg | ORAL_TABLET | Freq: Two times a day (BID) | ORAL | Status: DC

## 2012-09-17 NOTE — Telephone Encounter (Signed)
eScribe request for refill on Alprazolam Last filled - 07.03.14, #60x0 Last AEX - 05.02.14 Next AEX - 3 Months Please Advise/SLS

## 2012-09-17 NOTE — Telephone Encounter (Signed)
OK to send #60 with zero refills. 

## 2012-09-17 NOTE — Telephone Encounter (Signed)
Faxed to pharmacy

## 2012-09-19 ENCOUNTER — Ambulatory Visit (INDEPENDENT_AMBULATORY_CARE_PROVIDER_SITE_OTHER): Payer: PRIVATE HEALTH INSURANCE | Admitting: Family

## 2012-09-19 ENCOUNTER — Encounter: Payer: Self-pay | Admitting: Family

## 2012-09-19 VITALS — BP 106/76 | HR 93 | Temp 98.7°F | Resp 16 | Wt 231.0 lb

## 2012-09-19 DIAGNOSIS — E559 Vitamin D deficiency, unspecified: Secondary | ICD-10-CM

## 2012-09-19 DIAGNOSIS — B351 Tinea unguium: Secondary | ICD-10-CM | POA: Insufficient documentation

## 2012-09-19 DIAGNOSIS — I10 Essential (primary) hypertension: Secondary | ICD-10-CM

## 2012-09-19 DIAGNOSIS — D649 Anemia, unspecified: Secondary | ICD-10-CM

## 2012-09-19 MED ORDER — TERBINAFINE HCL 250 MG PO TABS: 250.0000 mg | ORAL_TABLET | Freq: Every day | ORAL | Status: DC

## 2012-09-19 NOTE — Patient Instructions (Addendum)
Start lamisil.  Follow up in 6 weeks for follow up blood work (liver tests). Follow up in 3 months for office visit.

## 2012-09-19 NOTE — Assessment & Plan Note (Signed)
Last vitamin D level was normal.  Continue vitamin D supplement.

## 2012-09-19 NOTE — Progress Notes (Signed)
Subjective:    Patient ID: [name] [name], female    DOB: Sep 27, 1955, 57 y.o.   MRN: [id number]  HPI  [name] [name] is a 57 yr old female who presents today for follow up.    She is s/p cervical decompression surgery performed by Dr. Shon Baton in May. Reports resolution of right arm pain.   HTN-  She is currently maintained on furosemide only.  Anxiety- currenltly on paxil and xanax. Reports improvement in her anxiety. Daughter is bipolar.  Now in hospital.   Anemia-  currently on iron supplement.  Last cbc was normal.    R ankle swelling- went to [facility]. Was told that her right foot swelling was neg for DVT. Then went to seen neurology.  Was told that she has a pinched nerve. Was instructed to take cymbalta for nerve pain.  Dr. Shon Baton referred her to a foot and ankle doctor.    Reports that her big toenails have been peeling off. Has been spraying tinactin every night.     Review of Systems See HPI  Past Medical History  Diagnosis Date  . GERD (gastroesophageal reflux disease)   . Allergy   . Osteoporosis   . Obesity     status post bariatric procedure in [facility] 2005  . Elevated alkaline phosphatase level     negative workup (presumed secondary to fatty liver)  . Thyroid nodule 7/10    `  . Interstitial cystitis     Dr Normajean Baxter  . 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 Thyroid nodule   . Anxiety   . Varicose vein of leg   . Neuromuscular disorder     rt arm carpal tunnel syndrome  . Frequency-urgency syndrome   . Arthritis   . Anemia     takes iron supplement    History   Social History  . Marital Status: Divorced    Spouse Name: N/A    Number of Children: 1  . Years of Education: N/A   Occupational History  . MANAGER    Social History Main Topics  . Smoking status: Never Smoker   . Smokeless tobacco: Never Used  . Alcohol Use: 0.6 oz/week    1 Glasses of wine per week     Comment: RARE  . Drug Use: No  . Sexually Active: Yes    Other Topics Concern  . Not on file   Social History Narrative  . No narrative on file    Past Surgical History  Procedure Laterality Date  . Joint replacement  2008    left total knee  . Stomach surgery  2007    "tummy tuck"  . Gastric bypass  2005  . Eye surgery  1997    bilateral cataract extraction  . Knee surgery  1997    left knee  . Refractive surgery  2006    lasik  . Nasal sinus surgery  1989    Dr Lazarus Salines  . Cryotherapy  1980  . Colposcopy  1980  . Shoulder surgery  2 2012    RIGHT   . Foot surgery      bilateral bunionettes  . Cysto with hydrodistension  12/12/2011    Procedure: CYSTOSCOPY/HYDRODISTENSION;  Surgeon: Marcine Matar, MD;  [facility];  Service: Urology;  Laterality: N/A;  30 MIN   . Bladder surgery  02/21/12 and 02/28/12    Dr Retta Diones  . Varicose vein surgery Right 02/2012    leg  .  Anterior cervical decomp/discectomy fusion N/A 06/21/2012    Procedure: ANTERIOR CERVICAL DECOMPRESSION/DISCECTOMY FUSION C-4 - C5 (SPACER/DePUY CERVICAL PLATE ONLY) 1 LEVEL;  Surgeon: Venita Lick, MD;  Location: [facility] OR;  Service: Orthopedics;  Laterality: N/A;    Family History  Problem Relation Age of Onset  . Diabetes Paternal Grandfather   . Hyperlipidemia Mother   . Hypertension Mother   . Hypertension Father   . Diabetes Father   . Prostate cancer Father   . Heart disease Maternal Grandmother   . Hypertension Maternal Grandmother   . Heart disease Maternal Grandfather   . Hypertension Maternal Grandfather   . Stroke Maternal Grandfather   . Stroke Paternal Grandfather     Allergies  Allergen Reactions  . Codeine Itching    REACTION: hives  . Cortisone     REACTION: palpitations  . 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  No allergy.  . Nitrofurantoin     REACTION: fever  . Prednisone     REACTION: palpitations    Current Outpatient Prescriptions on File Prior to Visit   Medication Sig Dispense Refill  . ALPRAZolam (XANAX) 0.5 MG tablet Take 1 tablet (0.5 mg total) by mouth 2 (two) times daily.  60 tablet  0  . amitriptyline (ELAVIL) 100 MG tablet Take 100 mg by mouth at bedtime.      . Azelastine HCl (ASTEPRO) 0.15 % SOLN 1 spray by Each Nare route 2 (two) times daily.        . Bepotastine Besilate (BEPREVE) 1.5 % SOLN Apply 1 drop to eye 2 (two) times daily as needed (allergies).       . calcium elemental as carbonate (TUMS ULTRA 1000) 400 MG tablet Chew 1,000 mg by mouth 2 (two) times daily.        . cetirizine (ZYRTEC) 10 MG tablet Take 10 mg by mouth daily.        . Cholecalciferol (VITAMIN D) 2000 UNITS CAPS Take 2,000 Units by mouth 2 (two) times daily.      . ferrous sulfate 325 (65 FE) MG tablet TAKE 1 TABLET BY MOUTH TWICE DAILY  60 tablet  5  . furosemide (LASIX) 20 MG tablet Take 20 mg by mouth as needed.      . mometasone (NASONEX) 50 MCG/ACT nasal spray Place 1 spray into the nose 2 (two) times daily.       . Multiple Vitamin (MULTIVITAMIN) tablet Take 1 tablet by mouth 2 (two) times daily.        Marland Kitchen MYRBETRIQ 50 MG TB24 Take 50 mg by mouth daily.      . Omega-3 Fatty Acids (FISH OIL) 1000 MG CAPS Take 1 capsule by mouth 2 (two) times daily.        Marland Kitchen omeprazole (PRILOSEC) 40 MG capsule Take 40 mg by mouth 2 (two) times daily.        Marland Kitchen PARoxetine (PAXIL) 30 MG tablet Take 1 tablet (30 mg total) by mouth every morning.  30 tablet  3  . valACYclovir (VALTREX) 500 MG tablet TAKE 1 TABLET BY MOUTH EVERY DAY  30 tablet  0  . VITAMIN A PO Take 1 tablet by mouth daily.       Marland Kitchen HYDROcodone-acetaminophen (NORCO) 10-325 MG per tablet Take 1 tablet by mouth every 6 (six) hours as needed for pain.  60 tablet  0  . methocarbamol (ROBAXIN) 500 MG tablet Take 1 tablet (500 mg total) by mouth  3 (three) times daily as needed.  60 tablet  0  . Pyridoxine HCl (VITAMIN B6 PO) Take 1 tablet by mouth daily.       No current facility-administered medications on file  prior to visit.    BP 106/76  Pulse 93  Temp(Src) 98.7 F (37.1 C) (Oral)  Resp 16  Wt 231 lb 0.6 oz (104.799 kg)  BMI 39.06 kg/m2  SpO2 99%       Objective:   Physical Exam  Constitutional: She appears well-developed and well-nourished. No distress.  Cardiovascular: Normal rate and regular rhythm.   No murmur heard. Pulmonary/Chest: Effort normal and breath sounds normal. No respiratory distress. She has no wheezes. She has no rales. She exhibits no tenderness.  Musculoskeletal:  Mild swelling of the right foot.   Skin:  Dry peeling skin on toes   Psychiatric: She has a normal mood and affect. Her behavior is normal. Judgment and thought content normal.  ext: thickened bilateral great toenails.         Assessment & [name]:

## 2012-09-19 NOTE — Assessment & Plan Note (Signed)
Last cbc normal. Continue iron supplement.

## 2012-09-19 NOTE — Assessment & Plan Note (Signed)
BP stable. Continue lasix.   

## 2012-09-19 NOTE — Assessment & Plan Note (Signed)
Will rx with lamisil.   

## 2012-09-21 ENCOUNTER — Telehealth: Payer: Self-pay | Admitting: *Deleted

## 2012-09-21 DIAGNOSIS — Z5181 Encounter for therapeutic drug level monitoring: Secondary | ICD-10-CM

## 2012-09-21 NOTE — Telephone Encounter (Signed)
Message copied by Kathi Simpers on Fri Sep 21, 2012 10:45 AM ------      Message from: O'SULLIVAN, MELISSA      Created: Wed Sep 19, 2012  8:34 AM       Pt will return in 6 weeks for lft dx therapeutic drug monitoring please ------

## 2012-09-21 NOTE — Telephone Encounter (Signed)
Order entered

## 2012-10-12 ENCOUNTER — Other Ambulatory Visit: Payer: Self-pay | Admitting: Family

## 2012-10-12 NOTE — Telephone Encounter (Signed)
Cannot be refilled until 9/2.  Called in refill for 9/2

## 2012-10-18 ENCOUNTER — Other Ambulatory Visit: Payer: Self-pay | Admitting: Family

## 2012-10-29 ENCOUNTER — Telehealth: Payer: Self-pay | Admitting: *Deleted

## 2012-10-29 DIAGNOSIS — Z5181 Encounter for therapeutic drug level monitoring: Secondary | ICD-10-CM

## 2012-10-29 LAB — HEPATIC FUNCTION PANEL
Albumin: 4.2 g/dL (ref 3.5–5.2)
Alkaline Phosphatase: 136 U/L — ABNORMAL HIGH (ref 39–117)
Total Protein: 6.9 g/dL (ref 6.0–8.3)

## 2012-10-29 NOTE — Telephone Encounter (Signed)
Pt presented to the lab for LFTs. Order entered per 09/19/12 office note.

## 2012-10-31 ENCOUNTER — Telehealth: Payer: Self-pay | Admitting: Family

## 2012-10-31 DIAGNOSIS — R748 Abnormal levels of other serum enzymes: Secondary | ICD-10-CM

## 2012-10-31 NOTE — Telephone Encounter (Signed)
Alk phos is mildly elevated on her blood work. Non-specific finding.  I would like her to repeat alk phos in 1 month please. Otherwise, LFT's are normal.

## 2012-11-01 NOTE — Telephone Encounter (Signed)
Left detailed message on pt's cell voicemail and to call if any questions. Lab order entered.

## 2012-11-10 ENCOUNTER — Other Ambulatory Visit: Payer: Self-pay | Admitting: Family

## 2012-11-12 NOTE — Telephone Encounter (Signed)
Rx request to pharmacy #30x0; Fill On or After 10.05.14 [Last Rx 08.06.14 #30x1]/SLS

## 2012-11-15 ENCOUNTER — Other Ambulatory Visit: Payer: Self-pay | Admitting: Gynecology

## 2012-11-15 NOTE — Telephone Encounter (Signed)
I asked appt desk to try to contact her to schedule CE.

## 2012-11-19 ENCOUNTER — Other Ambulatory Visit: Payer: Self-pay | Admitting: Family

## 2012-11-19 NOTE — Telephone Encounter (Signed)
eScribe request for refill on Alprazolam Last filled - 08.29.14, #60x0 Last AEX - 08.06.14 Next AEX - 6 Months Please Advise/SLS

## 2012-11-20 NOTE — Telephone Encounter (Signed)
OK to send #60 no refills. 

## 2012-11-21 NOTE — Telephone Encounter (Signed)
Rx called to pharmacy voicemail as below. 

## 2012-12-11 ENCOUNTER — Encounter: Payer: Self-pay | Admitting: Women's Health

## 2012-12-17 ENCOUNTER — Other Ambulatory Visit: Payer: Self-pay | Admitting: Family

## 2012-12-24 ENCOUNTER — Other Ambulatory Visit: Payer: Self-pay | Admitting: Family

## 2012-12-24 NOTE — Telephone Encounter (Signed)
eScribe request for refill on Alprazolam Last filled - 10.06.14, #60x0 Last AEX - 08.06.14 Next AEX - 3-mths [canceled scheduled appts for 11.2014] Please Advise/SLS

## 2012-12-24 NOTE — Telephone Encounter (Signed)
OK to send 60 tabs but needs follow up appointment.

## 2012-12-25 ENCOUNTER — Ambulatory Visit: Payer: PRIVATE HEALTH INSURANCE | Admitting: Family

## 2012-12-25 NOTE — Telephone Encounter (Signed)
Left detailed message informing patient of medication refill and that she needs to call our office to schedule follow up appointment.

## 2012-12-25 NOTE — Telephone Encounter (Signed)
Rx called to pharmacy voicemail. 

## 2012-12-25 NOTE — Telephone Encounter (Signed)
Please call pt to arrange follow up. 

## 2012-12-26 ENCOUNTER — Ambulatory Visit: Payer: PRIVATE HEALTH INSURANCE | Admitting: Family

## 2012-12-26 NOTE — Telephone Encounter (Signed)
Informed patient of medication refill and she scheduled appointment for 01/04/13

## 2013-01-04 ENCOUNTER — Encounter: Payer: Self-pay | Admitting: Family

## 2013-01-04 ENCOUNTER — Other Ambulatory Visit: Payer: Self-pay | Admitting: Family

## 2013-01-04 ENCOUNTER — Ambulatory Visit (INDEPENDENT_AMBULATORY_CARE_PROVIDER_SITE_OTHER): Payer: PRIVATE HEALTH INSURANCE | Admitting: Family

## 2013-01-04 VITALS — BP 112/82 | HR 86 | Temp 98.4°F | Resp 16 | Ht 65.0 in | Wt 236.1 lb

## 2013-01-04 DIAGNOSIS — I10 Essential (primary) hypertension: Secondary | ICD-10-CM

## 2013-01-04 DIAGNOSIS — E042 Nontoxic multinodular goiter: Secondary | ICD-10-CM

## 2013-01-04 DIAGNOSIS — F411 Generalized anxiety disorder: Secondary | ICD-10-CM

## 2013-01-04 DIAGNOSIS — B351 Tinea unguium: Secondary | ICD-10-CM

## 2013-01-04 DIAGNOSIS — N318 Other neuromuscular dysfunction of bladder: Secondary | ICD-10-CM

## 2013-01-04 DIAGNOSIS — N3281 Overactive bladder: Secondary | ICD-10-CM | POA: Insufficient documentation

## 2013-01-04 DIAGNOSIS — D649 Anemia, unspecified: Secondary | ICD-10-CM

## 2013-01-04 DIAGNOSIS — R748 Abnormal levels of other serum enzymes: Secondary | ICD-10-CM

## 2013-01-04 DIAGNOSIS — E041 Nontoxic single thyroid nodule: Secondary | ICD-10-CM

## 2013-01-04 LAB — CBC WITH DIFFERENTIAL/PLATELET
Eosinophils Absolute: 0.2 10*3/uL (ref 0.0–0.7)
Lymphocytes Relative: 30 % (ref 12–46)
Lymphs Abs: 2.2 10*3/uL (ref 0.7–4.0)
Neutrophils Relative %: 61 % (ref 43–77)
Platelets: 308 10*3/uL (ref 150–400)
RBC: 4.51 MIL/uL (ref 3.87–5.11)
WBC: 7.3 10*3/uL (ref 4.0–10.5)

## 2013-01-04 LAB — BASIC METABOLIC PANEL
BUN: 14 mg/dL (ref 6–23)
Calcium: 8.8 mg/dL (ref 8.4–10.5)
Chloride: 105 mEq/L (ref 96–112)
Potassium: 4.3 mEq/L (ref 3.5–5.3)

## 2013-01-04 LAB — IRON: Iron: 74 ug/dL (ref 42–145)

## 2013-01-04 LAB — ALKALINE PHOSPHATASE: Alkaline Phosphatase: 108 U/L (ref 39–117)

## 2013-01-04 LAB — FERRITIN: Ferritin: 102 ng/mL (ref 10–291)

## 2013-01-04 LAB — T3, FREE: T3, Free: 2.9 pg/mL (ref 2.3–4.2)

## 2013-01-04 LAB — T4, FREE: Free T4: 1.02 ng/dL (ref 0.80–1.80)

## 2013-01-04 LAB — TSH: TSH: 0.835 u[IU]/mL (ref 0.350–4.500)

## 2013-01-04 NOTE — Assessment & Plan Note (Signed)
Last [place] stable 8/13.  Check TFT's.

## 2013-01-04 NOTE — Assessment & Plan Note (Signed)
BP stable on furosemide, continue same. Obtain bmet.

## 2013-01-04 NOTE — Progress Notes (Signed)
Pre visit review using our clinic review tool, if applicable. No additional management support is needed unless otherwise documented below in the visit note. 

## 2013-01-04 NOTE — Assessment & Plan Note (Signed)
Repeat LFT today.

## 2013-01-04 NOTE — Assessment & Plan Note (Signed)
Improving, continue last month of lamisil.

## 2013-01-04 NOTE — Assessment & Plan Note (Signed)
Clinically stable on iron, continue same.  Obtain Iron level, cbc.

## 2013-01-04 NOTE — Patient Instructions (Signed)
Please complete lab work prior to leaving.  Follow up in 6 months.  

## 2013-01-04 NOTE — Progress Notes (Signed)
Subjective:    Patient ID: [name] [name], female    DOB: [birth year], 56 y.o.   MRN: [id number]  HPI  [name] [name] is a 57 yr old female who presents today for follow up.  1) HTN- maintained on furosemide.   BP Readings from Last 3 Encounters:  01/04/13 112/82  09/19/12 106/76  06/22/12 132/68   2) Anxiety- maintained on alprazolam QHS.  Rarely needing alprazolam during the day. Has tapered off of cymbalta and is now on paxil. Reports anxiety symptoms are well controlled.  Still stressed about her daughter's mental health.   3) Elevated alk phos-  Last visit alk phos was elevated at 136.   4) onychomycosis-  Reports that her toenails are improved and longer peeling. She is on her third refill.    5) Hx of anemia- She is continuing iron twice daily.  6) overactive bladder-  Maintained on myrbetriq- follows with Dr. Retta Diones.  Reports good response.   7) Multinodular goiter-  She has hx of neg biopsy. Had follow up US 8/13 which noted that thyroid nodules were stable in size.    Review of Systems    see HPI  Past Medical History  Diagnosis Date  . GERD (gastroesophageal reflux disease)   . Allergy   . Osteoporosis   . Obesity     status post bariatric procedure in [facility] 2005  . Elevated alkaline phosphatase level     negative workup (presumed secondary to fatty liver)  . Thyroid nodule 7/10    `  . Interstitial cystitis     Dr Normajean Baxter  . 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 Thyroid nodule   . Anxiety   . Varicose vein of leg   . Neuromuscular disorder     rt arm carpal tunnel syndrome  . Frequency-urgency syndrome   . Arthritis   . Anemia     takes iron supplement    History   Social History  . Marital Status: Divorced    Spouse Name: N/A    Number of Children: 1  . Years of Education: N/A   Occupational History  . MANAGER    Social History Main Topics  . Smoking status: Never Smoker   . Smokeless tobacco: Never Used  . Alcohol Use: 0.6 oz/week     1 Glasses of wine per week     Comment: RARE  . Drug Use: No  . Sexual Activity: Yes   Other Topics Concern  . Not on file   Social History Narrative  . No narrative on file    Past Surgical History  Procedure Laterality Date  . Joint replacement  2008    left total knee  . Stomach surgery  2007    "tummy tuck"  . Gastric bypass  2005  . Eye surgery  1997    bilateral cataract extraction  . Knee surgery  1997    left knee  . Refractive surgery  2006    lasik  . Nasal sinus surgery  1989    Dr Lazarus Salines  . Cryotherapy  1980  . Colposcopy  1980  . Shoulder surgery  2 2012    RIGHT   . Foot surgery      bilateral bunionettes  . Cysto with hydrodistension  12/12/2011    Procedure: CYSTOSCOPY/HYDRODISTENSION;  Surgeon: Marcine Matar, MD;  Location: [facility];  Service: Urology;  Laterality: N/A;  30 MIN   . Bladder surgery  02/21/12 and 02/28/12  Dr Retta Diones  . Varicose vein surgery Right 02/2012    leg  . Anterior cervical decomp/discectomy fusion N/A 06/21/2012    Procedure: ANTERIOR CERVICAL DECOMPRESSION/DISCECTOMY FUSION C-4 - C5 (SPACER/DePUY CERVICAL PLATE ONLY) 1 LEVEL;  Surgeon: Venita Lick, MD;  Location: [facility] OR;  Service: Orthopedics;  Laterality: N/A;    Family History  Problem Relation Age of Onset  . Diabetes Paternal Grandfather   . Hyperlipidemia Mother   . Hypertension Mother   . Hypertension Father   . Diabetes Father   . Prostate cancer Father   . Heart disease Maternal Grandmother   . Hypertension Maternal Grandmother   . Heart disease Maternal Grandfather   . Hypertension Maternal Grandfather   . Stroke Maternal Grandfather   . Stroke Paternal Grandfather     Allergies  Allergen Reactions  . Codeine Itching    REACTION: hives  . Cortisone     REACTION: palpitations  . 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  No allergy.  . Nitrofurantoin     REACTION: fever  . Prednisone      REACTION: palpitations    Current Outpatient Prescriptions on File Prior to Visit  Medication Sig Dispense Refill  . ALPRAZolam (XANAX) 0.5 MG tablet TAKE 1 TABLET BY MOUTH TWICE DAILY  60 tablet  0  . amitriptyline (ELAVIL) 100 MG tablet Take 100 mg by mouth at bedtime.      . Azelastine HCl (ASTEPRO) 0.15 % SOLN 1 spray by Each Nare route 2 (two) times daily.        . Bepotastine Besilate (BEPREVE) 1.5 % SOLN Apply 1 drop to eye 2 (two) times daily as needed (allergies).       . calcium elemental as carbonate (TUMS ULTRA 1000) 400 MG tablet Chew 1,000 mg by mouth 2 (two) times daily.        . cetirizine (ZYRTEC) 10 MG tablet Take 10 mg by mouth daily.        . Cholecalciferol (VITAMIN D) 2000 UNITS CAPS Take 2,000 Units by mouth 2 (two) times daily.      . DULoxetine (CYMBALTA) 60 MG capsule Take 1 capsule by mouth every evening.      . ferrous sulfate 325 (65 FE) MG tablet TAKE 1 TABLET BY MOUTH TWICE DAILY  60 tablet  5  . furosemide (LASIX) 20 MG tablet Take 20 mg by mouth as needed.      Marland Kitchen HYDROcodone-acetaminophen (NORCO) 10-325 MG per tablet Take 1 tablet by mouth every 6 (six) hours as needed for pain.  60 tablet  0  . methocarbamol (ROBAXIN) 500 MG tablet Take 1 tablet (500 mg total) by mouth 3 (three) times daily as needed.  60 tablet  0  . mometasone (NASONEX) 50 MCG/ACT nasal spray Place 1 spray into the nose 2 (two) times daily.       . Multiple Vitamin (MULTIVITAMIN) tablet Take 1 tablet by mouth 2 (two) times daily.        Marland Kitchen MYRBETRIQ 50 MG TB24 Take 50 mg by mouth daily.      . Omega-3 Fatty Acids (FISH OIL) 1000 MG CAPS Take 1 capsule by mouth 2 (two) times daily.        Marland Kitchen omeprazole (PRILOSEC) 40 MG capsule Take 40 mg by mouth 2 (two) times daily.        Marland Kitchen PARoxetine (PAXIL) 30 MG tablet TAKE 1 TABLET BY MOUTH EVERY MORNING  30 tablet  3  . terbinafine (LAMISIL) 250 MG tablet TAKE 1 TABLET BY MOUTH EVERY DAY  30 tablet  0  . valACYclovir (VALTREX) 500 MG tablet TAKE 1  TABLET BY MOUTH EVERY DAY. NEEDS APPT  30 tablet  0  . VITAMIN A PO Take 1 tablet by mouth daily.        No current facility-administered medications on file prior to visit.    BP 112/82  Pulse 86  Temp(Src) 98.4 F (36.9 C) (Oral)  Resp 16  Ht 5\' 5"  (1.651 m)  Wt 236 lb 1.3 oz (107.085 kg)  BMI 39.29 kg/m2  SpO2 98%    Objective:   Physical Exam  Constitutional: She is oriented to person, place, and time. She appears well-developed and well-nourished. No distress.  HENT:  Head: Normocephalic and atraumatic.  Neck: No thyromegaly present.  Cardiovascular: Normal rate and regular rhythm.   No murmur heard. Pulmonary/Chest: Effort normal and breath sounds normal. No respiratory distress. She has no wheezes. She has no rales. She exhibits no tenderness.  Musculoskeletal:  Some swelling right ankle  Lymphadenopathy:    She has no cervical adenopathy.  Neurological: She is alert and oriented to person, place, and time.  Skin: Skin is warm and dry.  Psychiatric: She has a normal mood and affect. Her behavior is normal. Judgment and thought content normal.          Assessment & [name]:

## 2013-01-04 NOTE — Assessment & Plan Note (Signed)
Stable on myrbetriq, managed by Dr. Retta Diones (urology)

## 2013-01-04 NOTE — Assessment & Plan Note (Signed)
Stable on paxil and prn alprazolam. Continue same.  

## 2013-01-07 ENCOUNTER — Encounter: Payer: Self-pay | Admitting: Family

## 2013-01-09 ENCOUNTER — Telehealth: Payer: Self-pay | Admitting: *Deleted

## 2013-01-09 LAB — HEMOGLOBIN A1C: Hgb A1c MFr Bld: 5.6 % (ref <5.7)

## 2013-01-09 NOTE — Telephone Encounter (Signed)
Called and added test per Gertie Gowda at [facility]. Awaiting result.

## 2013-01-09 NOTE — Telephone Encounter (Signed)
Message copied by Kathi Simpers on Wed Jan 09, 2013  7:45 AM ------      Message from: O'SULLIVAN, MELISSA      Created: Tue Jan 08, 2013  5:13 PM       Could you pls ask lab to add on A1C, dx hyperglycemia? thanks ------

## 2013-01-17 ENCOUNTER — Other Ambulatory Visit: Payer: Self-pay | Admitting: Family

## 2013-01-17 NOTE — Telephone Encounter (Signed)
eScribe request for refill on Alprazolam Last filled - 11.10.14, #60x0 Last AEX - 11.21.14 Next AEX - 6 Months Please Advise/SLS

## 2013-01-21 NOTE — Telephone Encounter (Signed)
RX sent to pharmacy  

## 2013-01-21 NOTE — Telephone Encounter (Signed)
OK to send 60 tabs with zero refills.  

## 2013-01-25 ENCOUNTER — Ambulatory Visit (INDEPENDENT_AMBULATORY_CARE_PROVIDER_SITE_OTHER): Payer: PRIVATE HEALTH INSURANCE | Admitting: Family

## 2013-01-25 ENCOUNTER — Encounter: Payer: Self-pay | Admitting: Family

## 2013-01-25 VITALS — BP 120/86 | HR 89 | Temp 98.0°F | Resp 16 | Ht 65.0 in | Wt 237.1 lb

## 2013-01-25 DIAGNOSIS — H6690 Otitis media, unspecified, unspecified ear: Secondary | ICD-10-CM | POA: Insufficient documentation

## 2013-01-25 DIAGNOSIS — H6692 Otitis media, unspecified, left ear: Secondary | ICD-10-CM

## 2013-01-25 MED ORDER — AMOXICILLIN-POT CLAVULANATE 875-125 MG PO TABS: 1.0000 | ORAL_TABLET | Freq: Two times a day (BID) | ORAL | Status: DC

## 2013-01-25 NOTE — Progress Notes (Signed)
Pre visit review using our clinic review tool, if applicable. No additional management support is needed unless otherwise documented below in the visit note. 

## 2013-01-25 NOTE — Patient Instructions (Signed)
Call if symptoms worsen or if not improved in 2-3 days.  

## 2013-01-25 NOTE — Progress Notes (Signed)
Subjective:    Patient ID: [name] [name], female    DOB: [birth year], 56 y.o.   MRN: [id number]  HPI  [name] [name] is a 57 yr old female who presents today to discuss nasal congestion.  She reports multiple family members have been ill. Reports sore throat, sinus drainage, green/bloody. Reports associated hoarseness. Has been using dayquil/nyquil which has helped the cough.  Reports symptoms started early Tuesday Morning.  Stayed home from work which is unusual. Has felt hot/sweaty but has not had a documented temp at home.   Review of Systems  HENT:       + pharyngeal erythema. No exudates      see HPI  Past Medical History  Diagnosis Date  . GERD (gastroesophageal reflux disease)   . Allergy   . Osteoporosis   . Obesity     status post bariatric procedure in [facility] 2005  . Elevated alkaline phosphatase level     negative workup (presumed secondary to fatty liver)  . Thyroid nodule 7/10    `  . Interstitial cystitis     Dr Normajean Baxter  . 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 Thyroid nodule   . Anxiety   . Varicose vein of leg   . Neuromuscular disorder     rt arm carpal tunnel syndrome  . Frequency-urgency syndrome   . Arthritis   . Anemia     takes iron supplement    History   Social History  . Marital Status: Divorced    Spouse Name: N/A    Number of Children: 1  . Years of Education: N/A   Occupational History  . MANAGER    Social History Main Topics  . Smoking status: Never Smoker   . Smokeless tobacco: Never Used  . Alcohol Use: 0.6 oz/week    1 Glasses of wine per week     Comment: RARE  . Drug Use: No  . Sexual Activity: Yes   Other Topics Concern  . Not on file   Social History Narrative  . No narrative on file    Past Surgical History  Procedure Laterality Date  . Joint replacement  2008    left total knee  . Stomach surgery  2007    "tummy tuck"  . Gastric bypass  2005  . Eye surgery  1997    bilateral cataract extraction  . Knee  surgery  1997    left knee  . Refractive surgery  2006    lasik  . Nasal sinus surgery  1989    Dr Lazarus Salines  . Cryotherapy  1980  . Colposcopy  1980  . Shoulder surgery  2 2012    RIGHT   . Foot surgery      bilateral bunionettes  . Cysto with hydrodistension  12/12/2011    Procedure: CYSTOSCOPY/HYDRODISTENSION;  Surgeon: Marcine Matar, MD;  Location: [facility];  Service: Urology;  Laterality: N/A;  30 MIN   . Bladder surgery  02/21/12 and 02/28/12    Dr Retta Diones  . Varicose vein surgery Right 02/2012    leg  . Anterior cervical decomp/discectomy fusion N/A 06/21/2012    Procedure: ANTERIOR CERVICAL DECOMPRESSION/DISCECTOMY FUSION C-4 - C5 (SPACER/DePUY CERVICAL PLATE ONLY) 1 LEVEL;  Surgeon: Venita Lick, MD;  Location: [facility] OR;  Service: Orthopedics;  Laterality: N/A;    Family History  Problem Relation Age of Onset  . Diabetes Paternal Grandfather   . Hyperlipidemia Mother   . Hypertension Mother   .  Hypertension Father   . Diabetes Father   . Prostate cancer Father   . Heart disease Maternal Grandmother   . Hypertension Maternal Grandmother   . Heart disease Maternal Grandfather   . Hypertension Maternal Grandfather   . Stroke Maternal Grandfather   . Stroke Paternal Grandfather     Allergies  Allergen Reactions  . Codeine Itching    REACTION: hives  . Cortisone     REACTION: palpitations  . 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  No allergy.  . Nitrofurantoin     REACTION: fever  . Prednisone     REACTION: palpitations    Current Outpatient Prescriptions on File Prior to Visit  Medication Sig Dispense Refill  . ALPRAZolam (XANAX) 0.5 MG tablet TAKE 1 TABLET BY MOUTH TWICE DAILY. NEEDS APPT  60 tablet  0  . amitriptyline (ELAVIL) 100 MG tablet Take 100 mg by mouth at bedtime.      . Azelastine HCl (ASTEPRO) 0.15 % SOLN 1 spray by Each Nare route 2 (two) times daily.        . Bepotastine Besilate (BEPREVE)  1.5 % SOLN Apply 1 drop to eye 2 (two) times daily as needed (allergies).       . calcium elemental as carbonate (TUMS ULTRA 1000) 400 MG tablet Chew 1,000 mg by mouth 2 (two) times daily.        . cetirizine (ZYRTEC) 10 MG tablet Take 10 mg by mouth daily.        . Cholecalciferol (VITAMIN D) 2000 UNITS CAPS Take 2,000 Units by mouth 2 (two) times daily.      . ferrous sulfate 325 (65 FE) MG tablet TAKE 1 TABLET BY MOUTH TWICE DAILY  60 tablet  5  . furosemide (LASIX) 20 MG tablet Take 20 mg by mouth as needed.      Marland Kitchen HYDROcodone-acetaminophen (NORCO) 10-325 MG per tablet Take 1 tablet by mouth every 6 (six) hours as needed for pain.  60 tablet  0  . Methen-Hyosc-Meth Blue-Na Phos (UTA) 120 MG CAPS As needed for dysuria.      . methocarbamol (ROBAXIN) 500 MG tablet Take 1 tablet (500 mg total) by mouth 3 (three) times daily as needed.  60 tablet  0  . mometasone (NASONEX) 50 MCG/ACT nasal spray Place 1 spray into the nose 2 (two) times daily.       . Multiple Vitamin (MULTIVITAMIN) tablet Take 1 tablet by mouth 2 (two) times daily.        Marland Kitchen MYRBETRIQ 50 MG TB24 Take 50 mg by mouth daily.      . Omega-3 Fatty Acids (FISH OIL) 1000 MG CAPS Take 1 capsule by mouth 2 (two) times daily.        Marland Kitchen omeprazole (PRILOSEC) 40 MG capsule Take 40 mg by mouth 2 (two) times daily.        Marland Kitchen PARoxetine (PAXIL) 30 MG tablet TAKE 1 TABLET BY MOUTH EVERY MORNING  30 tablet  3  . terbinafine (LAMISIL) 250 MG tablet TAKE 1 TABLET BY MOUTH EVERY DAY  30 tablet  0  . valACYclovir (VALTREX) 500 MG tablet TAKE 1 TABLET BY MOUTH EVERY DAY. NEEDS APPT  30 tablet  0  . VITAMIN A PO Take 1 tablet by mouth daily.        No current facility-administered medications on file prior to visit.    BP 120/86  Pulse 89  Temp(Src) 98 F (36.7  C) (Oral)  Resp 16  Ht 5\' 5"  (1.651 m)  Wt 237 lb 1.9 oz (107.557 kg)  BMI 39.46 kg/m2  SpO2 99%    Objective:   Physical Exam  Constitutional: She appears well-developed and  well-nourished. No distress.  HENT:  Head: Normocephalic and atraumatic.  Right Ear: Tympanic membrane and ear canal normal.  + maxillary sinus tenderness to palpation R>L. L TM is pink.    Cardiovascular: Normal rate and regular rhythm.   No murmur heard. Pulmonary/Chest: Effort normal and breath sounds normal. No respiratory distress. She has no wheezes. She has no rales. She exhibits no tenderness.  Lymphadenopathy:    She has no cervical adenopathy.          Assessment & [name]:

## 2013-01-25 NOTE — Assessment & Plan Note (Signed)
Pt with early L OM.  Rx with augmentin, continue prn Nyquil/dayquil.

## 2013-02-04 ENCOUNTER — Telehealth: Payer: Self-pay | Admitting: Family

## 2013-02-04 NOTE — Telephone Encounter (Signed)
Spoke with pt, she states that she felt she getting better at first. Continues to have sinus drainage, hoarseness and cough. Pt wants to know if she could get a different antibiotic?  Please advise.

## 2013-02-04 NOTE — Telephone Encounter (Signed)
Notified pt and scheduled appt for tomorrow at 8:30am.

## 2013-02-04 NOTE — Telephone Encounter (Signed)
Let's see her back in office to re-evaluate please.

## 2013-02-04 NOTE — Telephone Encounter (Signed)
Patient states that she is not feeling any better since last visit and would like to know if Melissa would call in another round of antibiotics?

## 2013-02-05 ENCOUNTER — Encounter: Payer: Self-pay | Admitting: Family

## 2013-02-05 ENCOUNTER — Ambulatory Visit (INDEPENDENT_AMBULATORY_CARE_PROVIDER_SITE_OTHER): Payer: PRIVATE HEALTH INSURANCE | Admitting: Family

## 2013-02-05 VITALS — BP 138/84 | HR 95 | Temp 97.4°F | Resp 18 | Ht 65.0 in | Wt 235.1 lb

## 2013-02-05 DIAGNOSIS — J329 Chronic sinusitis, unspecified: Secondary | ICD-10-CM

## 2013-02-05 MED ORDER — PREDNISONE 10 MG PO TABS: ORAL_TABLET | ORAL | Status: DC

## 2013-02-05 MED ORDER — AMOXICILLIN-POT CLAVULANATE 875-125 MG PO TABS: ORAL_TABLET | ORAL | Status: DC

## 2013-02-05 NOTE — Assessment & Plan Note (Signed)
She has seen Dr. Sharyn Lull (ENT) in the past for difficult to treat sinusitis and notes that he has had to extend her augmentin x 14 more days and treat her with steroids (states she can tolerate lower doses of prednisone- has palpitations with high dose). Will continue augmentin and add pred taper.

## 2013-02-05 NOTE — Patient Instructions (Signed)
Please call if symptoms worsen or if not improved in 1 week.  

## 2013-02-05 NOTE — Progress Notes (Signed)
Pre visit review using our clinic review tool, if applicable. No additional management support is needed unless otherwise documented below in the visit note. 

## 2013-02-05 NOTE — Progress Notes (Signed)
Subjective:    Patient ID: [name] [name], female    DOB: 1955/03/26, 57 y.o.   MRN: [id number]  HPI  Mr. Marvel Plan is a 57 yr old female who presents today with chief complaint of cough. She was treated with augmentin for OM and sinus.  Reports that symptoms improved.  Saturday night she developed hoarsness.  She reports ongoing nasal drainage, sinuses feel clogged. When she does get nasal drainage she has been getting thick green "gook."  She continues Allegra D. She continues Azelastine and nasonex.    Review of Systems See HPI  Past Medical History  Diagnosis Date  . GERD (gastroesophageal reflux disease)   . Allergy   . Osteoporosis   . Obesity     status post bariatric procedure in [facility] 2005  . Elevated alkaline phosphatase level     negative workup (presumed secondary to fatty liver)  . Thyroid nodule 7/10    `  . Interstitial cystitis     Dr Normajean Baxter  . 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 Thyroid nodule   . Anxiety   . Varicose vein of leg   . Neuromuscular disorder     rt arm carpal tunnel syndrome  . Frequency-urgency syndrome   . Arthritis   . Anemia     takes iron supplement    History   Social History  . Marital Status: Divorced    Spouse Name: N/A    Number of Children: 1  . Years of Education: N/A   Occupational History  . MANAGER    Social History Main Topics  . Smoking status: Never Smoker   . Smokeless tobacco: Never Used  . Alcohol Use: 0.6 oz/week    1 Glasses of wine per week     Comment: RARE  . Drug Use: No  . Sexual Activity: Yes   Other Topics Concern  . Not on file   Social History Narrative  . No narrative on file    Past Surgical History  Procedure Laterality Date  . Joint replacement  2008    left total knee  . Stomach surgery  2007    "tummy tuck"  . Gastric bypass  2005  . Eye surgery  1997    bilateral cataract extraction  . Knee surgery  1997    left knee  . Refractive surgery  2006    lasik  . Nasal  sinus surgery  1989    Dr Lazarus Salines  . Cryotherapy  1980  . Colposcopy  1980  . Shoulder surgery  2 2012    RIGHT   . Foot surgery      bilateral bunionettes  . Cysto with hydrodistension  12/12/2011    Procedure: CYSTOSCOPY/HYDRODISTENSION;  Surgeon: Marcine Matar, MD;  Location: [facility];  Service: Urology;  Laterality: N/A;  30 MIN   . Bladder surgery  02/21/12 and 02/28/12    Dr Retta Diones  . Varicose vein surgery Right 02/2012    leg  . Anterior cervical decomp/discectomy fusion N/A 06/21/2012    Procedure: ANTERIOR CERVICAL DECOMPRESSION/DISCECTOMY FUSION C-4 - C5 (SPACER/DePUY CERVICAL PLATE ONLY) 1 LEVEL;  Surgeon: Venita Lick, MD;  Location: [facility] OR;  Service: Orthopedics;  Laterality: N/A;    Family History  Problem Relation Age of Onset  . Diabetes Paternal Grandfather   . Hyperlipidemia Mother   . Hypertension Mother   . Hypertension Father   . Diabetes Father   . Prostate cancer Father   .  Heart disease Maternal Grandmother   . Hypertension Maternal Grandmother   . Heart disease Maternal Grandfather   . Hypertension Maternal Grandfather   . Stroke Maternal Grandfather   . Stroke Paternal Grandfather     Allergies  Allergen Reactions  . Codeine Itching    REACTION: hives  . Cortisone     REACTION: palpitations  . 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  No allergy.  . Nitrofurantoin     REACTION: fever  . Prednisone     REACTION: palpitations    Current Outpatient Prescriptions on File Prior to Visit  Medication Sig Dispense Refill  . ALPRAZolam (XANAX) 0.5 MG tablet TAKE 1 TABLET BY MOUTH TWICE DAILY. NEEDS APPT  60 tablet  0  . amitriptyline (ELAVIL) 100 MG tablet Take 100 mg by mouth at bedtime.      . Azelastine HCl (ASTEPRO) 0.15 % SOLN 1 spray by Each Nare route 2 (two) times daily.        . Bepotastine Besilate (BEPREVE) 1.5 % SOLN Apply 1 drop to eye 2 (two) times daily as needed (allergies).        . calcium elemental as carbonate (TUMS ULTRA 1000) 400 MG tablet Chew 1,000 mg by mouth 2 (two) times daily.        . Cholecalciferol (VITAMIN D) 2000 UNITS CAPS Take 2,000 Units by mouth 2 (two) times daily.      . ferrous sulfate 325 (65 FE) MG tablet TAKE 1 TABLET BY MOUTH TWICE DAILY  60 tablet  5  . furosemide (LASIX) 20 MG tablet Take 20 mg by mouth as needed.      Marland Kitchen HYDROcodone-acetaminophen (NORCO) 10-325 MG per tablet Take 1 tablet by mouth every 6 (six) hours as needed for pain.  60 tablet  0  . Methen-Hyosc-Meth Blue-Na Phos (UTA) 120 MG CAPS As needed for dysuria.      . methocarbamol (ROBAXIN) 500 MG tablet Take 1 tablet (500 mg total) by mouth 3 (three) times daily as needed.  60 tablet  0  . mometasone (NASONEX) 50 MCG/ACT nasal spray Place 1 spray into the nose 2 (two) times daily.       . Multiple Vitamin (MULTIVITAMIN) tablet Take 1 tablet by mouth 2 (two) times daily.        Marland Kitchen MYRBETRIQ 50 MG TB24 Take 50 mg by mouth daily.      . Omega-3 Fatty Acids (FISH OIL) 1000 MG CAPS Take 1 capsule by mouth 2 (two) times daily.        Marland Kitchen omeprazole (PRILOSEC) 40 MG capsule Take 40 mg by mouth 2 (two) times daily.        Marland Kitchen PARoxetine (PAXIL) 30 MG tablet TAKE 1 TABLET BY MOUTH EVERY MORNING  30 tablet  3  . valACYclovir (VALTREX) 500 MG tablet TAKE 1 TABLET BY MOUTH EVERY DAY. NEEDS APPT  30 tablet  0  . VITAMIN A PO Take 1 tablet by mouth daily.        No current facility-administered medications on file prior to visit.    BP 138/84  Pulse 95  Temp(Src) 97.4 F (36.3 C) (Oral)  Resp 18  Ht 5\' 5"  (1.651 m)  Wt 235 lb 1.3 oz (106.632 kg)  BMI 39.12 kg/m2  SpO2 98%       Objective:   Physical Exam  Constitutional: She is oriented to person, place, and time. She appears well-developed and well-nourished. No distress.  HENT:  Head: Normocephalic and atraumatic.  Mouth/Throat: No oropharyngeal exudate or posterior oropharyngeal edema.  Eyes:  + frontal and maxillary sinus  tenderness to palpation  Cardiovascular: Normal rate and regular rhythm.   No murmur heard. Pulmonary/Chest: Effort normal and breath sounds normal. No respiratory distress. She has no wheezes. She has no rales. She exhibits no tenderness.  Neurological: She is alert and oriented to person, place, and time.  Skin: Skin is warm and dry.  Psychiatric: She has a normal mood and affect. Her behavior is normal. Judgment and thought content normal.          Assessment & Plan:

## 2013-02-20 ENCOUNTER — Other Ambulatory Visit: Payer: Self-pay | Admitting: Family

## 2013-02-21 NOTE — Telephone Encounter (Signed)
eScribe request from [facility] for refill on Paxil Last filled - 09.04.14, #30x3 eScribe request from [facility] for refill on Alprazolam Last filled - 12.04.14, #60x0 Last AEX - 11.21.14 Next AEX - 6 Months Please Advise/SLS

## 2013-02-22 NOTE — Telephone Encounter (Signed)
Alprazolam Rx faxed to pharmacy.

## 2013-03-25 ENCOUNTER — Other Ambulatory Visit: Payer: Self-pay | Admitting: Family

## 2013-03-25 NOTE — Telephone Encounter (Signed)
Medication name:  Name from pharmacy:  ALPRAZolam (XANAX) 0.5 MG tablet  ALPRAZOLAM 0.5MG  TABLETS Sig: TAKE 1 TABLET BY MOUTH TWICE DAILY Dispense: 60 tablet Refills: 0 Start: 03/25/2013 Class: Normal Requested on: 03/25/2013 Originally ordered on: 05/10/2010 Last refill: 02/22/2013

## 2013-03-26 NOTE — Telephone Encounter (Signed)
OK to send meds as pended below.

## 2013-03-26 NOTE — Telephone Encounter (Signed)
Alprazolam called to pharmacy voicemail, #60 x no refills. Paxil rx sent via eRx.

## 2013-04-22 ENCOUNTER — Other Ambulatory Visit: Payer: Self-pay | Admitting: Family

## 2013-04-23 NOTE — Telephone Encounter (Signed)
Pt is due for follow up in May. Last refill called in 03/25/13.  Please advise.  Medication name:  Name from pharmacy:  ALPRAZolam (XANAX) 0.5 MG tablet  ALPRAZOLAM 0.5MG  TABLETS Sig: TAKE 1 TABLET BY MOUTH TWICE DAILY Dispense: 60 tablet Refills: 0 Start: 04/22/2013 Class: Normal Requested on: 04/22/2013 Originally ordered on: 05/10/2010 Last refill: 03/26/2013

## 2013-04-23 NOTE — Telephone Encounter (Signed)
OK to send 60 tabs with zero refills.  

## 2013-04-24 NOTE — Telephone Encounter (Signed)
Rx called to pharmacy voicemail as below. 

## 2013-05-21 ENCOUNTER — Other Ambulatory Visit: Payer: Self-pay | Admitting: Family

## 2013-05-22 NOTE — Telephone Encounter (Signed)
Ok to send 60 tabs zero refills.

## 2013-05-22 NOTE — Telephone Encounter (Signed)
Pt is due for follow up in May. Please advise.  Medication name:  Name from pharmacy:  ALPRAZolam (XANAX) 0.5 MG tablet  ALPRAZOLAM 0.5MG  TABLETS Sig: TAKE 1 TABLET BY MOUTH TWICE DAILY Dispense: 60 tablet Refills: 0 Start: 05/21/2013 Class: Normal Requested on: 05/21/2013 Originally ordered on: 05/10/2010 Last refill: 04/24/2013

## 2013-06-03 ENCOUNTER — Other Ambulatory Visit: Payer: Self-pay | Admitting: Family

## 2013-06-03 DIAGNOSIS — Z1231 Encounter for screening mammogram for malignant neoplasm of breast: Secondary | ICD-10-CM

## 2013-06-12 ENCOUNTER — Other Ambulatory Visit: Payer: Self-pay | Admitting: Family

## 2013-06-14 NOTE — Telephone Encounter (Signed)
Refill 30 day supply of paroxetine. Pt is due for 6 month follow up now.  Please call pt to arrange appt before current supply runs out.

## 2013-06-14 NOTE — Telephone Encounter (Signed)
Informed patient of medication refill and she scheduled appointment for 06/28/13

## 2013-06-17 ENCOUNTER — Ambulatory Visit (HOSPITAL_BASED_OUTPATIENT_CLINIC_OR_DEPARTMENT_OTHER)
Admission: RE | Admit: 2013-06-17 | Discharge: 2013-06-17 | Disposition: A | Discharge status: Home / Self Care | Payer: BC Managed Care – PPO | Source: Ambulatory Visit | Attending: Family | Admitting: Family

## 2013-06-17 DIAGNOSIS — R922 Inconclusive mammogram: Secondary | ICD-10-CM | POA: Insufficient documentation

## 2013-06-17 DIAGNOSIS — Z1231 Encounter for screening mammogram for malignant neoplasm of breast: Secondary | ICD-10-CM | POA: Insufficient documentation

## 2013-06-24 ENCOUNTER — Other Ambulatory Visit: Payer: Self-pay | Admitting: Family

## 2013-06-24 NOTE — Telephone Encounter (Signed)
Pt has f/u on 06/28/13. Please advise refill:  Medication name:  Name from pharmacy:  ALPRAZolam (XANAX) 0.5 MG tablet  ALPRAZOLAM 0.5MG  TABLETS Sig: TAKE 1 TABLET BY MOUTH TWICE DAILY Dispense: 60 tablet Refills: 0 Start: 06/24/2013 Class: Normal Requested on: 06/24/2013 Originally ordered on: 05/10/2010 Last refill: 05/24/2013

## 2013-06-25 NOTE — Telephone Encounter (Signed)
Refill called to pharmacy voicemail. 

## 2013-06-25 NOTE — Telephone Encounter (Signed)
OK to send #60 tabs zero refills.  

## 2013-06-28 ENCOUNTER — Ambulatory Visit (INDEPENDENT_AMBULATORY_CARE_PROVIDER_SITE_OTHER): Payer: BC Managed Care – PPO | Admitting: Family

## 2013-06-28 ENCOUNTER — Encounter: Payer: Self-pay | Admitting: Family

## 2013-06-28 ENCOUNTER — Other Ambulatory Visit (HOSPITAL_COMMUNITY)
Admission: RE | Admit: 2013-06-28 | Discharge: 2013-06-28 | Disposition: A | Discharge status: Home / Self Care | Payer: BC Managed Care – PPO | Source: Ambulatory Visit | Attending: Family | Admitting: Family

## 2013-06-28 VITALS — BP 120/80 | HR 112 | Temp 100.2°F | Resp 16 | Ht 65.0 in | Wt 235.1 lb

## 2013-06-28 DIAGNOSIS — J329 Chronic sinusitis, unspecified: Secondary | ICD-10-CM

## 2013-06-28 DIAGNOSIS — Z0000 Encounter for general adult medical examination without abnormal findings: Secondary | ICD-10-CM

## 2013-06-28 DIAGNOSIS — Z01419 Encounter for gynecological examination (general) (routine) without abnormal findings: Secondary | ICD-10-CM | POA: Insufficient documentation

## 2013-06-28 LAB — CBC WITH DIFFERENTIAL/PLATELET
Basophils Absolute: 0 10*3/uL (ref 0.0–0.1)
Basophils Relative: 0 % (ref 0–1)
EOS ABS: 0 10*3/uL (ref 0.0–0.7)
Eosinophils Relative: 0 % (ref 0–5)
HEMATOCRIT: 36.6 % (ref 36.0–46.0)
Hemoglobin: 12.2 g/dL (ref 12.0–15.0)
Lymphocytes Relative: 11 % — ABNORMAL LOW (ref 12–46)
Lymphs Abs: 1.8 10*3/uL (ref 0.7–4.0)
MCH: 28.9 pg (ref 26.0–34.0)
MCHC: 33.3 g/dL (ref 30.0–36.0)
MCV: 86.7 fL (ref 78.0–100.0)
MONO ABS: 1.2 10*3/uL — AB (ref 0.1–1.0)
Monocytes Relative: 7 % (ref 3–12)
Neutro Abs: 13.5 10*3/uL — ABNORMAL HIGH (ref 1.7–7.7)
Neutrophils Relative %: 82 % — ABNORMAL HIGH (ref 43–77)
Platelets: 268 10*3/uL (ref 150–400)
RBC: 4.22 MIL/uL (ref 3.87–5.11)
RDW: 13.9 % (ref 11.5–15.5)
WBC: 16.5 10*3/uL — ABNORMAL HIGH (ref 4.0–10.5)

## 2013-06-28 LAB — BASIC METABOLIC PANEL WITH GFR
BUN: 12 mg/dL (ref 6–23)
CALCIUM: 8.7 mg/dL (ref 8.4–10.5)
CHLORIDE: 101 meq/L (ref 96–112)
CO2: 24 meq/L (ref 19–32)
Creat: 0.63 mg/dL (ref 0.50–1.10)
GFR, Est African American: >89 mL/min
GFR, Est Non African American: >89 mL/min
Glucose, Bld: 101 mg/dL — ABNORMAL HIGH (ref 70–99)
Potassium: 3.9 mEq/L (ref 3.5–5.3)
SODIUM: 137 meq/L (ref 135–145)

## 2013-06-28 LAB — LIPID PANEL
Cholesterol: 163 mg/dL (ref 0–200)
HDL: 54 mg/dL (ref >39)
LDL CALC: 94 mg/dL (ref 0–99)
Total CHOL/HDL Ratio: 3 Ratio
Triglycerides: 77 mg/dL (ref <150)
VLDL: 15 mg/dL (ref 0–40)

## 2013-06-28 LAB — TSH: TSH: 0.517 u[IU]/mL (ref 0.350–4.500)

## 2013-06-28 LAB — HEPATIC FUNCTION PANEL
ALBUMIN: 4 g/dL (ref 3.5–5.2)
ALK PHOS: 87 U/L (ref 39–117)
ALT: 14 U/L (ref 0–35)
AST: 16 U/L (ref 0–37)
BILIRUBIN INDIRECT: 0.5 mg/dL (ref 0.2–1.2)
Bilirubin, Direct: 0.2 mg/dL (ref 0.0–0.3)
Total Bilirubin: 0.7 mg/dL (ref 0.2–1.2)
Total Protein: 6.8 g/dL (ref 6.0–8.3)

## 2013-06-28 MED ORDER — TERCONAZOLE 0.8 % VA CREA: 1.0000 | TOPICAL_CREAM | Freq: Every day | VAGINAL | Status: DC

## 2013-06-28 MED ORDER — AMOXICILLIN-POT CLAVULANATE 875-125 MG PO TABS: 1.0000 | ORAL_TABLET | Freq: Two times a day (BID) | ORAL | Status: DC

## 2013-06-28 NOTE — Progress Notes (Signed)
Subjective:    Patient ID: [name] [name], female    DOB: 15-Jul-1955, 58 y.o.   MRN: [id number]  HPI  Patient presents today for complete physical.  Immunizations: up to date Diet: reports healthy diet. Eats a lot of fruit Exercise: not exercising due to arthritis in her foot.  Also having knee pain.   Wt Readings from Last 3 Encounters:  06/28/13 235 lb 1.9 oz (106.65 kg)  02/05/13 235 lb 1.3 oz (106.632 kg)  01/25/13 237 lb 1.9 oz (107.557 kg)  Colonoscopy: 2008- normal per pt Dexa: 7/13 Pap Smear: 7/12- due Mammogram: 06/17/13   Nasal congestion- pt reports HA's, nasal irritation, fever x 2 days. Has had sinus congestion x weeks, feels like sinus infection reports temp 103 at home.     Review of Systems See HPI  Past Medical History  Diagnosis Date  . GERD (gastroesophageal reflux disease)   . Allergy   . Osteoporosis   . Obesity     status post bariatric procedure in [facility] 2005  . Elevated alkaline phosphatase level     negative workup (presumed secondary to fatty liver)  . Thyroid nodule 7/10    `  . Interstitial cystitis     Dr Vernie Shanks  . 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 Thyroid nodule   . Anxiety   . Varicose vein of leg   . Neuromuscular disorder     rt arm carpal tunnel syndrome  . Frequency-urgency syndrome   . Arthritis   . Anemia     takes iron supplement    History   Social History  . Marital Status: Divorced    Spouse Name: N/A    Number of Children: 1  . Years of Education: N/A   Occupational History  . MANAGER    Social History Main Topics  . Smoking status: Never Smoker   . Smokeless tobacco: Never Used  . Alcohol Use: 0.6 oz/week    1 Glasses of wine per week     Comment: RARE  . Drug Use: No  . Sexual Activity: Yes   Other Topics Concern  . Not on file   Social History Narrative  . No narrative on file    Past Surgical History  Procedure Laterality Date  . Joint replacement  2008    left total knee  .  Stomach surgery  2007    "tummy tuck"  . Gastric bypass  2005  . Eye surgery  1997    bilateral cataract extraction  . Knee surgery  1997    left knee  . Refractive surgery  2006    lasik  . Nasal sinus surgery  7858    Dr Erik Obey  . Cryotherapy  1980  . Colposcopy  1980  . Shoulder surgery  2 2012    RIGHT   . Foot surgery      bilateral bunionettes  . Cysto with hydrodistension  12/12/2011    Procedure: CYSTOSCOPY/HYDRODISTENSION;  Surgeon: Franchot Gallo, MD;  Location: [facility];  Service: Urology;  Laterality: N/A;  30 MIN   . Bladder surgery  02/21/12 and 02/28/12    Dr Diona Fanti  . Varicose vein surgery Right 02/2012    leg  . Anterior cervical decomp/discectomy fusion N/A 06/21/2012    Procedure: ANTERIOR CERVICAL DECOMPRESSION/DISCECTOMY FUSION C-4 - C5 (SPACER/DePUY CERVICAL PLATE ONLY) 1 LEVEL;  Surgeon: Melina Schools, MD;  Location: [facility];  Service: Orthopedics;  Laterality: N/A;  Family History  Problem Relation Age of Onset  . Diabetes Paternal Grandfather   . Hyperlipidemia Mother   . Hypertension Mother   . Hypertension Father   . Diabetes Father   . Prostate cancer Father   . Heart disease Maternal Grandmother   . Hypertension Maternal Grandmother   . Heart disease Maternal Grandfather   . Hypertension Maternal Grandfather   . Stroke Maternal Grandfather   . Stroke Paternal Grandfather     Allergies  Allergen Reactions  . Codeine Itching    REACTION: hives  . Cortisone     REACTION: palpitations  . 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  No allergy.  . Nitrofurantoin     REACTION: fever  . Prednisone     REACTION: palpitations    Current Outpatient Prescriptions on File Prior to Visit  Medication Sig Dispense Refill  . ALPRAZolam (XANAX) 0.5 MG tablet TAKE 1 TABLET BY MOUTH TWICE DAILY  60 tablet  0  . amitriptyline (ELAVIL) 100 MG tablet Take 100 mg by mouth at bedtime.      .  Azelastine HCl (ASTEPRO) 0.15 % SOLN 1 spray by Each Nare route 2 (two) times daily.        . Bepotastine Besilate (BEPREVE) 1.5 % SOLN Apply 1 drop to eye 2 (two) times daily as needed (allergies).       . calcium elemental as carbonate (TUMS ULTRA 1000) 400 MG tablet Chew 1,000 mg by mouth 2 (two) times daily.        . Cholecalciferol (VITAMIN D) 2000 UNITS CAPS Take 2,000 Units by mouth 2 (two) times daily.      . ferrous sulfate 325 (65 FE) MG tablet TAKE 1 TABLET BY MOUTH TWICE DAILY  60 tablet  5  . fexofenadine-pseudoephedrine (ALLEGRA-D 24) 180-240 MG per 24 hr tablet Take 1 tablet by mouth daily.      . furosemide (LASIX) 20 MG tablet Take 20 mg by mouth as needed.      Marland Kitchen HYDROcodone-acetaminophen (NORCO) 10-325 MG per tablet Take 1 tablet by mouth every 6 (six) hours as needed for pain.  60 tablet  0  . Methen-Hyosc-Meth Blue-Na Phos (UTA) 120 MG CAPS As needed for dysuria.      . mometasone (NASONEX) 50 MCG/ACT nasal spray Place 1 spray into the nose 2 (two) times daily.       . Multiple Vitamin (MULTIVITAMIN) tablet Take 1 tablet by mouth 2 (two) times daily.        Marland Kitchen MYRBETRIQ 50 MG TB24 Take 50 mg by mouth daily.      . Omega-3 Fatty Acids (FISH OIL) 1000 MG CAPS Take 1 capsule by mouth 2 (two) times daily.        Marland Kitchen omeprazole (PRILOSEC) 40 MG capsule Take 40 mg by mouth 2 (two) times daily.        Marland Kitchen PARoxetine (PAXIL) 30 MG tablet TAKE 1 TABLET BY MOUTH EVERY MORNING  30 tablet  0  . valACYclovir (VALTREX) 500 MG tablet TAKE 1 TABLET BY MOUTH EVERY DAY. NEEDS APPT  30 tablet  0  . VITAMIN A PO Take 1 tablet by mouth daily.        No current facility-administered medications on file prior to visit.    BP 120/80  Pulse 112  Temp(Src) 100.2 F (37.9 C) (Oral)  Resp 16  Ht 5\' 5"  (1.651 m)  Wt 235 lb 1.9 oz (106.65 kg)  BMI 39.13 kg/m2  SpO2 97%       Objective:   Physical Exam Physical Exam  Constitutional: She is oriented to person, place, and time. She appears  well-developed and well-nourished. No distress.  HENT:  Head: Normocephalic and atraumatic. + maxillary and sinus tenderness to palpation. Right Ear: Tympanic membrane and ear canal normal.  Left Ear: Tympanic membrane and ear canal normal.  Mouth/Throat: Oropharynx is clear and moist.  Eyes: Pupils are equal, round, and reactive to light. No scleral icterus.  Neck: Normal range of motion. No thyromegaly present.  Cardiovascular: Normal rate and regular rhythm.   No murmur heard. Pulmonary/Chest: Effort normal and breath sounds normal. No respiratory distress. He has no wheezes. She has no rales. She exhibits no tenderness.  Abdominal: Soft. Bowel sounds are normal. He exhibits no distension and no mass. There is no tenderness. There is no rebound and no guarding.  Musculoskeletal: She exhibits no edema.  Lymphadenopathy:    She has no cervical adenopathy.  Neurological: She is alert and oriented to person, place, and time.  She exhibits normal muscle tone. Coordination normal.  Skin: Skin is warm and dry.  Psychiatric: She has a normal mood and affect. Her behavior is normal. Judgment and thought content normal.  Breasts: Examined lying Right: Without masses, retractions, discharge or axillary adenopathy.  Left: Without masses, retractions, discharge or axillary adenopathy.  Inguinal/mons: Normal without inguinal adenopathy  External genitalia: Normal  BUS/Urethra/Skene's glands: Normal  Bladder: Normal  Vagina: Normal  Cervix: Normal  Uterus: normal in size, shape and contour. Midline and mobile  Adnexa/parametria:  Rt: Without masses or tenderness.  Lt: Without masses or tenderness.  Anus and perineum: Normal           Assessment & Plan:          Assessment & Plan:

## 2013-06-28 NOTE — Assessment & Plan Note (Signed)
Will rx with augmentin x 14 days (she has required longer treatment in the past).  Requests rx for terconazole empiric rx for yeast.

## 2013-06-28 NOTE — Assessment & Plan Note (Signed)
Discussed healthy diet, exercise, weight loss.  Obtain fasting labs- pt reports unable to proved urine sample today- will follow up with urology.  Pap performed today with chaperone.

## 2013-06-28 NOTE — Patient Instructions (Addendum)
Please complete lab work prior to leaving. Start augmentin. Call if sinus symptoms worsen, or if not improved in 2-3 days. Continue to work on weight loss and try to get exercise such as swimming or stationary bike as tolerated.  Follow up in 3 months.

## 2013-06-29 LAB — VITAMIN D 25 HYDROXY (VIT D DEFICIENCY, FRACTURES): VIT D 25 HYDROXY: 36 ng/mL (ref 30–89)

## 2013-06-30 ENCOUNTER — Telehealth: Payer: Self-pay | Admitting: Family

## 2013-06-30 DIAGNOSIS — D72829 Elevated white blood cell count, unspecified: Secondary | ICD-10-CM

## 2013-06-30 NOTE — Telephone Encounter (Signed)
Please notify pt- white blood cell count is elevated.  Likely due to her infection.  She should complete abx and repeat cbc in about 2 weeks (dx leukocytosis).  Vit D,thyroid, cholesterol and liver are all normal. Continue vitamin D supplement.  Sugar was just a touch elevated- not in the diabetic range. We will continue to monitor this. Continue to work on [employer], exercise, weight loss.

## 2013-07-03 ENCOUNTER — Encounter: Payer: Self-pay | Admitting: Family

## 2013-07-03 NOTE — Telephone Encounter (Signed)
Notified pt and she voices understanding. Lab order entered. 

## 2013-07-09 ENCOUNTER — Other Ambulatory Visit: Payer: Self-pay | Admitting: Family

## 2013-07-10 ENCOUNTER — Telehealth: Payer: Self-pay | Admitting: *Deleted

## 2013-07-10 NOTE — Telephone Encounter (Signed)
Received message from pt requesting proof of recent pap smear to submit to [facility]. She requests that we fax info to [phone]. Pt will need to contact the billing dept to request claim info noting proof of pap.

## 2013-07-12 NOTE — Telephone Encounter (Signed)
Spoke with pt. She requests that I fax copy of pap smear result to her at [phone]. Result faxed.

## 2013-07-19 LAB — CBC WITH DIFFERENTIAL/PLATELET
BASOS ABS: 0.1 10*3/uL (ref 0.0–0.1)
Basophils Relative: 1 % (ref 0–1)
Eosinophils Absolute: 0.2 10*3/uL (ref 0.0–0.7)
Eosinophils Relative: 4 % (ref 0–5)
HEMATOCRIT: 35.8 % — AB (ref 36.0–46.0)
Hemoglobin: 11.7 g/dL — ABNORMAL LOW (ref 12.0–15.0)
Lymphocytes Relative: 35 % (ref 12–46)
Lymphs Abs: 2.2 10*3/uL (ref 0.7–4.0)
MCH: 28.3 pg (ref 26.0–34.0)
MCHC: 32.7 g/dL (ref 30.0–36.0)
MCV: 86.7 fL (ref 78.0–100.0)
MONO ABS: 0.3 10*3/uL (ref 0.1–1.0)
Monocytes Relative: 5 % (ref 3–12)
NEUTROS ABS: 3.4 10*3/uL (ref 1.7–7.7)
Neutrophils Relative %: 55 % (ref 43–77)
PLATELETS: 267 10*3/uL (ref 150–400)
RBC: 4.13 MIL/uL (ref 3.87–5.11)
RDW: 14.2 % (ref 11.5–15.5)
WBC: 6.2 10*3/uL (ref 4.0–10.5)

## 2013-07-21 ENCOUNTER — Telehealth: Payer: Self-pay | Admitting: Family

## 2013-07-21 DIAGNOSIS — D649 Anemia, unspecified: Secondary | ICD-10-CM

## 2013-07-21 NOTE — Telephone Encounter (Signed)
Follow up white blood cell count is normal. She does have mild anemia though.   I would like her to complete the following labs below including an IFOB to further evaluate please.

## 2013-07-22 NOTE — Telephone Encounter (Signed)
Left detailed message on voicemail and to call if any questions. 

## 2013-07-26 ENCOUNTER — Other Ambulatory Visit: Payer: Self-pay | Admitting: Family

## 2013-07-26 NOTE — Telephone Encounter (Signed)
Refill called to pharmacy voicemail. 

## 2013-07-26 NOTE — Telephone Encounter (Signed)
OK to send 60 tabs with zero refills.

## 2013-08-01 LAB — IRON AND TIBC
%SAT: 23 % (ref 20–55)
Iron: 55 ug/dL (ref 42–145)
TIBC: 240 ug/dL — ABNORMAL LOW (ref 250–470)
UIBC: 185 ug/dL (ref 125–400)

## 2013-08-01 LAB — FOLATE: Folate: >20 ng/mL

## 2013-08-01 LAB — VITAMIN B12: Vitamin B-12: 1001 pg/mL — ABNORMAL HIGH (ref 211–911)

## 2013-08-04 ENCOUNTER — Encounter: Payer: Self-pay | Admitting: Family

## 2013-08-07 ENCOUNTER — Other Ambulatory Visit: Payer: Self-pay | Admitting: Family

## 2013-08-28 ENCOUNTER — Other Ambulatory Visit: Payer: Self-pay | Admitting: Family

## 2013-08-28 NOTE — Telephone Encounter (Signed)
eScribe request from [facility] for refill on Alprazolam 0.5 mg Last filled - 06.12.15, #[id number] Last AEX - 05.15.15 Next AEX - 3 Mths Please Advise on refills/SLS

## 2013-08-29 NOTE — Telephone Encounter (Signed)
Ok to send 60 tabs zero refills. 

## 2013-09-03 ENCOUNTER — Other Ambulatory Visit: Payer: Self-pay | Admitting: Family

## 2013-09-03 ENCOUNTER — Telehealth: Payer: Self-pay | Admitting: *Deleted

## 2013-09-03 NOTE — Telephone Encounter (Signed)
Received fax refill request from [facility] for alprazolam 0.5mg . Last rx printed 07/26/13, #60. Pt is due for follow up around 09/28/13. Please advise refill.

## 2013-09-03 NOTE — Telephone Encounter (Signed)
Ok to send 60 tabs

## 2013-09-04 MED ORDER — ALPRAZOLAM 0.5 MG PO TABS: ORAL_TABLET | ORAL | Status: DC

## 2013-09-04 NOTE — Telephone Encounter (Signed)
Rx called to pharmacy voicemail. 

## 2013-09-10 ENCOUNTER — Other Ambulatory Visit: Payer: Self-pay | Admitting: Family

## 2013-09-18 ENCOUNTER — Encounter: Payer: Self-pay | Admitting: Physician Assistant

## 2013-09-18 ENCOUNTER — Ambulatory Visit (INDEPENDENT_AMBULATORY_CARE_PROVIDER_SITE_OTHER): Payer: BC Managed Care – PPO | Admitting: Physician Assistant

## 2013-09-18 VITALS — BP 104/82 | HR 92 | Temp 98.8°F | Resp 16 | Ht 65.0 in | Wt 241.2 lb

## 2013-09-18 DIAGNOSIS — R1032 Left lower quadrant pain: Secondary | ICD-10-CM

## 2013-09-18 DIAGNOSIS — K5792 Diverticulitis of intestine, part unspecified, without perforation or abscess without bleeding: Secondary | ICD-10-CM

## 2013-09-18 DIAGNOSIS — K5732 Diverticulitis of large intestine without perforation or abscess without bleeding: Secondary | ICD-10-CM

## 2013-09-18 LAB — POCT URINALYSIS DIPSTICK
Bilirubin, UA: NEGATIVE
Blood, UA: NEGATIVE
Glucose, UA: NEGATIVE
Ketones, UA: NEGATIVE
LEUKOCYTES UA: NEGATIVE
Nitrite, UA: NEGATIVE
PROTEIN UA: NEGATIVE
Spec Grav, UA: <=1.005
Urobilinogen, UA: 0.2
pH, UA: 6

## 2013-09-18 MED ORDER — CIPROFLOXACIN HCL 500 MG PO TABS: 500.0000 mg | ORAL_TABLET | Freq: Two times a day (BID) | ORAL | Status: DC

## 2013-09-18 MED ORDER — TIOCONAZOLE 6.5 % VA OINT: 1.0000 | TOPICAL_OINTMENT | Freq: Once | VAGINAL | Status: DC

## 2013-09-18 MED ORDER — METRONIDAZOLE 500 MG PO TABS: 500.0000 mg | ORAL_TABLET | Freq: Three times a day (TID) | ORAL | Status: DC

## 2013-09-18 NOTE — Assessment & Plan Note (Signed)
Giving symptoms, PE and history, seems mostly consistent with a very mild case of diverticulitis.  Do not feel imaging is warranted at present.  Will Rx Cipro and Flagyl.  Stay well hydrated -- recommended full liquid diet for 24 hours.  Discussed avoidance of trigger foods.  If symptoms are not improving over the next 24-48 hours, or if symptoms acutely worsen, proceed to the ER.

## 2013-09-18 NOTE — Patient Instructions (Signed)
Please take medications as directed.  Please stick with a liquid diet over the next day or two.  Can restart solid foods as pain is resolving.  Avoid foods with seeds, nuts, husks.  After this acute episode resolves, you need to make sure your diet is well balanced.  Take Tramadol if needed for pain.  Continue other medications as directed.  If symptoms are not improving over the next 1-2 days, please call the office.  If anything acutely worsens, please proceed to the ER.

## 2013-09-18 NOTE — Progress Notes (Signed)
Pre visit review using our clinic review tool, if applicable. No additional management support is needed unless otherwise documented below in the visit note/SLS  

## 2013-09-18 NOTE — Progress Notes (Signed)
Patient presents to clinic today c/o LLQ pain since yesterday at noon.  Patient describes pain as throbbing with occasional stabbing pains.  Non radiating. Patient endorses malaise.  Patient denies fever, chills, nausea, vomiting. Denies change to bowel habits.  Denies melena, hematochezia or tenesmus. Has + history of diverticulosis noted on last screening colonoscopy in 2008.  Denies history of diverticulitis.    Past Medical History  Diagnosis Date  . GERD (gastroesophageal reflux disease)   . Allergy   . Osteoporosis   . Obesity     status post bariatric procedure in [facility] 2005  . Elevated alkaline phosphatase level     negative workup (presumed secondary to fatty liver)  . Thyroid nodule 7/10    `  . Interstitial cystitis     Dr Vernie Shanks  . 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 Thyroid nodule   . Anxiety   . Varicose vein of leg   . Neuromuscular disorder     rt arm carpal tunnel syndrome  . Frequency-urgency syndrome   . Arthritis   . Anemia     takes iron supplement    Current Outpatient Prescriptions on File Prior to Visit  Medication Sig Dispense Refill  . ALPRAZolam (XANAX) 0.5 MG tablet TAKE 1 TABLET BY MOUTH TWICE DAILY  60 tablet  0  . amoxicillin-clavulanate (AUGMENTIN) 875-125 MG per tablet Take 1 tablet by mouth 2 (two) times daily.  28 tablet  0  . Azelastine HCl (ASTEPRO) 0.15 % SOLN 1 spray by Each Nare route 2 (two) times daily.        . Bepotastine Besilate (BEPREVE) 1.5 % SOLN Apply 1 drop to eye 2 (two) times daily as needed (allergies).       . calcium elemental as carbonate (TUMS ULTRA 1000) 400 MG tablet Chew 1,000 mg by mouth 2 (two) times daily.        . Cholecalciferol (VITAMIN D) 2000 UNITS CAPS Take 2,000 Units by mouth 2 (two) times daily.      . ferrous sulfate 325 (65 FE) MG tablet TAKE 1 TABLET BY MOUTH TWICE DAILY  60 tablet  0  . fexofenadine-pseudoephedrine (ALLEGRA-D 24) 180-240 MG per 24 hr tablet Take 1 tablet by mouth daily.      .  furosemide (LASIX) 20 MG tablet Take 20 mg by mouth as needed.      Marland Kitchen HYDROcodone-acetaminophen (NORCO) 10-325 MG per tablet Take 1 tablet by mouth every 6 (six) hours as needed for pain.  60 tablet  0  . Methen-Hyosc-Meth Blue-Na Phos (UTA) 120 MG CAPS As needed for dysuria.      . mometasone (NASONEX) 50 MCG/ACT nasal spray Place 1 spray into the nose 2 (two) times daily.       . Multiple Vitamin (MULTIVITAMIN) tablet Take 1 tablet by mouth 2 (two) times daily.        Marland Kitchen MYRBETRIQ 50 MG TB24 Take 50 mg by mouth daily.      . Omega-3 Fatty Acids (FISH OIL) 1000 MG CAPS Take 1 capsule by mouth 2 (two) times daily.        Marland Kitchen omeprazole (PRILOSEC) 40 MG capsule Take 40 mg by mouth 2 (two) times daily.        Marland Kitchen PARoxetine (PAXIL) 30 MG tablet TAKE 1 TABLET BY MOUTH EVERY MORNING  30 tablet  0  . terconazole (TERAZOL 3) 0.8 % vaginal cream Place 1 applicator vaginally at bedtime.  20 g  0  .  valACYclovir (VALTREX) 500 MG tablet TAKE 1 TABLET BY MOUTH EVERY DAY. NEEDS APPT  30 tablet  0  . VITAMIN A PO Take 1 tablet by mouth daily.        No current facility-administered medications on file prior to visit.    Allergies  Allergen Reactions  . Codeine Itching    REACTION: hives  . Cortisone     REACTION: palpitations  . 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  No allergy.  . Nitrofurantoin     REACTION: fever  . Prednisone     REACTION: palpitations    Family History  Problem Relation Age of Onset  . Diabetes Paternal Grandfather   . Hyperlipidemia Mother   . Hypertension Mother   . Hypertension Father   . Diabetes Father   . Prostate cancer Father   . Heart disease Maternal Grandmother   . Hypertension Maternal Grandmother   . Heart disease Maternal Grandfather   . Hypertension Maternal Grandfather   . Stroke Maternal Grandfather   . Stroke Paternal Grandfather     History   Social History  . Marital Status: Divorced    Spouse Name: N/A     Number of Children: 1  . Years of Education: N/A   Occupational History  . MANAGER    Social History Main Topics  . Smoking status: Never Smoker   . Smokeless tobacco: Never Used  . Alcohol Use: 0.6 oz/week    1 Glasses of wine per week     Comment: RARE  . Drug Use: No  . Sexual Activity: Yes   Other Topics Concern  . None   Social History Narrative  . None   Review of Systems - See HPI.  All other ROS are negative.  BP 104/82  Pulse 92  Temp(Src) 98.8 F (37.1 C) (Oral)  Resp 16  Ht _0  (1.651 m)  Wt 241 lb 4 oz (109.43 kg)  BMI 40.15 kg/m2  SpO2 98%  Physical Exam  Constitutional: She is well-developed, well-nourished, and in no distress.  HENT:  Head: Normocephalic and atraumatic.  Eyes: Conjunctivae are normal.  Neck: Neck supple.  Cardiovascular: Normal rate, regular rhythm, normal heart sounds and intact distal pulses.   Pulmonary/Chest: Effort normal and breath sounds normal. No respiratory distress. She has no wheezes. She has no rales. She exhibits no tenderness.  Abdominal: Soft. Bowel sounds are normal. She exhibits no distension and no mass. There is no hepatosplenomegaly. There is tenderness in the left lower quadrant. There is no rigidity, no rebound, no guarding and no CVA tenderness. No hernia.  Skin: Skin is warm and dry. No rash noted.  Psychiatric: Affect normal.    Recent Results (from the past 2160 hour(s))  BASIC METABOLIC PANEL WITH GFR     Status: Abnormal   Collection Time    06/28/13  8:36 AM      Result Value Ref Range   Sodium 137  135 - 145 mEq/L   Potassium 3.9  3.5 - 5.3 mEq/L   Chloride 101  96 - 112 mEq/L   CO2 24  19 - 32 mEq/L   Glucose, Bld 101 (*) 70 - 99 mg/dL   BUN 12  6 - 23 mg/dL   Creat 0.63  0.50 - 1.10 mg/dL   Calcium 8.7  8.4 - 10.5 mg/dL   GFR, Est African American >89     GFR, Est Non African American >89  Comment:       The estimated GFR is a calculation valid for adults (>=45 years old)     that uses  the CKD-EPI algorithm to adjust for age and sex. It is       not to be used for children, pregnant women, hospitalized patients,        patients on dialysis, or with rapidly changing kidney function.     According to the NKDEP, eGFR >89 is normal, 60-89 shows mild     impairment, 30-59 shows moderate impairment, 15-29 shows severe     impairment and <15 is ESRD.        CBC WITH DIFFERENTIAL     Status: Abnormal   Collection Time    06/28/13  8:36 AM      Result Value Ref Range   WBC 16.5 (*) 4.0 - 10.5 K/uL   RBC 4.22  3.87 - 5.11 MIL/uL   Hemoglobin 12.2  12.0 - 15.0 g/dL   HCT 36.6  36.0 - 46.0 %   MCV 86.7  78.0 - 100.0 fL   MCH 28.9  26.0 - 34.0 pg   MCHC 33.3  30.0 - 36.0 g/dL   RDW 13.9  11.5 - 15.5 %   Platelets 268  150 - 400 K/uL   Neutrophils Relative % 82 (*) 43 - 77 %   Neutro Abs 13.5 (*) 1.7 - 7.7 K/uL   Lymphocytes Relative 11 (*) 12 - 46 %   Lymphs Abs 1.8  0.7 - 4.0 K/uL   Monocytes Relative 7  3 - 12 %   Monocytes Absolute 1.2 (*) 0.1 - 1.0 K/uL   Eosinophils Relative 0  0 - 5 %   Eosinophils Absolute 0.0  0.0 - 0.7 K/uL   Basophils Relative 0  0 - 1 %   Basophils Absolute 0.0  0.0 - 0.1 K/uL   Smear Review Criteria for review not met    HEPATIC FUNCTION PANEL     Status: None   Collection Time    06/28/13  8:36 AM      Result Value Ref Range   Total Bilirubin 0.7  0.2 - 1.2 mg/dL   Bilirubin, Direct 0.2  0.0 - 0.3 mg/dL   Indirect Bilirubin 0.5  0.2 - 1.2 mg/dL   Alkaline Phosphatase 87  39 - 117 U/L   AST 16  0 - 37 U/L   ALT 14  0 - 35 U/L   Total Protein 6.8  6.0 - 8.3 g/dL   Albumin 4.0  3.5 - 5.2 g/dL  LIPID PANEL     Status: None   Collection Time    06/28/13  8:36 AM      Result Value Ref Range   Cholesterol 163  0 - 200 mg/dL   Comment: ATP III Classification:           < 200        mg/dL        Desirable          200 - 239     mg/dL        Borderline High          >= 240        mg/dL        High         Triglycerides 77  <150 mg/dL    HDL 54  >39 mg/dL   Total CHOL/HDL Ratio 3.0     VLDL 15  0 - 40 mg/dL   LDL Cholesterol 94  0 - 99 mg/dL   Comment:       Total Cholesterol/HDL Ratio:CHD Risk                            Coronary Heart Disease Risk Table                                            Men       Women              1/2 Average Risk              3.4        3.3                  Average Risk              5.0        4.4               2X Average Risk              9.6        7.1               3X Average Risk             23.4       11.0     Use the calculated Patient Ratio above and the CHD Risk table      to determine the patient's CHD Risk.     ATP III Classification (LDL):           < 100        mg/dL         Optimal          100 - 129     mg/dL         Near or Above Optimal          130 - 159     mg/dL         Borderline High          160 - 189     mg/dL         High           > 190        mg/dL         Very High        TSH     Status: None   Collection Time    06/28/13  8:36 AM      Result Value Ref Range   TSH 0.517  0.350 - 4.500 uIU/mL  VITAMIN D 25 HYDROXY     Status: None   Collection Time    06/28/13  8:36 AM      Result Value Ref Range   Vit D, 25-Hydroxy 36  30 - 89 ng/mL   Comment: This assay accurately quantifies Vitamin D, which is the sum of the     25-Hydroxy forms of Vitamin D2 and D3.  Studies have shown that the     optimum concentration of 25-Hydroxy Vitamin D is 30 ng/mL or higher.      Concentrations of Vitamin D between 20 and 29 ng/mL are considered to     be insufficient and concentrations less than 20  ng/mL are considered     to be deficient for Vitamin D.  CBC WITH DIFFERENTIAL     Status: Abnormal   Collection Time    07/19/13  2:10 PM      Result Value Ref Range   WBC 6.2  4.0 - 10.5 K/uL   RBC 4.13  3.87 - 5.11 MIL/uL   Hemoglobin 11.7 (*) 12.0 - 15.0 g/dL   HCT 35.8 (*) 36.0 - 46.0 %   MCV 86.7  78.0 - 100.0 fL   MCH 28.3  26.0 - 34.0 pg   MCHC 32.7  30.0 - 36.0 g/dL    RDW 14.2  11.5 - 15.5 %   Platelets 267  150 - 400 K/uL   Neutrophils Relative % 55  43 - 77 %   Neutro Abs 3.4  1.7 - 7.7 K/uL   Lymphocytes Relative 35  12 - 46 %   Lymphs Abs 2.2  0.7 - 4.0 K/uL   Monocytes Relative 5  3 - 12 %   Monocytes Absolute 0.3  0.1 - 1.0 K/uL   Eosinophils Relative 4  0 - 5 %   Eosinophils Absolute 0.2  0.0 - 0.7 K/uL   Basophils Relative 1  0 - 1 %   Basophils Absolute 0.1  0.0 - 0.1 K/uL   Smear Review Criteria for review not met    IRON AND TIBC     Status: Abnormal   Collection Time    08/01/13  8:03 AM      Result Value Ref Range   Iron 55  42 - 145 ug/dL   UIBC 185  125 - 400 ug/dL   TIBC 240 (*) 250 - 470 ug/dL   %SAT 23  20 - 55 %  FOLATE     Status: None   Collection Time    08/01/13  8:03 AM      Result Value Ref Range   Folate >20.0     Comment:       Reference Ranges             Deficient:       0.4 - 3.3 ng/mL             Indeterminate:   3.4 - 5.4 ng/mL             Normal:              > 5.4 ng/mL        VITAMIN B12     Status: Abnormal   Collection Time    08/01/13  8:03 AM      Result Value Ref Range   Vitamin B-12 1001 (*) 211 - 911 pg/mL  POCT URINALYSIS DIPSTICK     Status: Normal   Collection Time    09/18/13  8:08 AM      Result Value Ref Range   Color, UA golden     Clarity, UA clear     Glucose, UA neg     Bilirubin, UA neg     Ketones, UA neg     Spec Grav, UA <=1.005     Blood, UA neg     pH, UA 6.0     Protein, UA neg     Urobilinogen, UA 0.2     Nitrite, UA neg     Leukocytes, UA Negative      Assessment/Plan: Diverticulitis of intestine without perforation or abscess without bleeding Giving symptoms, PE and history, seems mostly consistent  with a very mild case of diverticulitis.  Do not feel imaging is warranted at present.  Will Rx Cipro and Flagyl.  Stay well hydrated -- recommended full liquid diet for 24 hours.  Discussed avoidance of trigger foods.  If symptoms are not improving over the next  24-48 hours, or if symptoms acutely worsen, proceed to the ER.

## 2013-09-28 ENCOUNTER — Other Ambulatory Visit: Payer: Self-pay | Admitting: Family

## 2013-09-30 ENCOUNTER — Other Ambulatory Visit: Payer: Self-pay | Admitting: Family

## 2013-10-24 ENCOUNTER — Encounter: Payer: Self-pay | Admitting: Medical

## 2013-10-24 ENCOUNTER — Telehealth: Payer: Self-pay

## 2013-10-24 ENCOUNTER — Ambulatory Visit (INDEPENDENT_AMBULATORY_CARE_PROVIDER_SITE_OTHER): Payer: BC Managed Care – PPO | Admitting: Medical

## 2013-10-24 VITALS — BP 121/86 | HR 72 | Temp 98.5°F | Ht 64.2 in | Wt 242.8 lb

## 2013-10-24 DIAGNOSIS — J018 Other acute sinusitis: Secondary | ICD-10-CM

## 2013-10-24 DIAGNOSIS — J309 Allergic rhinitis, unspecified: Secondary | ICD-10-CM

## 2013-10-24 MED ORDER — PREDNISONE 10 MG PO TABS: 10.0000 mg | ORAL_TABLET | Freq: Every day | ORAL | Status: DC

## 2013-10-24 MED ORDER — TIOCONAZOLE 6.5 % VA OINT: 1.0000 | TOPICAL_OINTMENT | Freq: Once | VAGINAL | Status: DC

## 2013-10-24 MED ORDER — AMOXICILLIN-POT CLAVULANATE 875-125 MG PO TABS: 1.0000 | ORAL_TABLET | Freq: Two times a day (BID) | ORAL | Status: DC

## 2013-10-24 NOTE — Assessment & Plan Note (Signed)
Gave 4 day taper prednisone. Only after pt assured me recently used and no reactions. States tried various time and no side effects. But did rx low dose and taper for safety.  Continue nasal sprays,

## 2013-10-24 NOTE — Progress Notes (Signed)
   Subjective:    Patient ID: [name] [name], female    DOB: November 06, 1955, 58 y.o.   MRN: [id number]  HPI  Pt in with some flair of allergies for 3-4 weeks(started with a lot of sneezing). Nasal congested,  Worse sneezing and cough moderate over past week. Pt takes astelin and nasonex. She has been on for years. Some sinus pressure and ear pain. Worse on rt side. No hx of asthma. She is not diabetic. She does have hx of sinusitis easily.    Review of Systems  Constitutional: Positive for chills. Negative for fever and fatigue.  HENT: Positive for congestion, ear pain, postnasal drip, sinus pressure and sneezing. Negative for sore throat and trouble swallowing.   Respiratory: Negative for cough, choking, chest tightness and wheezing.   Cardiovascular: Negative for chest pain and palpitations.  Musculoskeletal: Negative.   Skin: Negative.   Neurological: Negative.   Hematological: Negative for adenopathy. Does not bruise/bleed easily.       Objective:   Physical Exam  General  Mental Status - Alert. General Appearance - Well groomed. Not in acute distress.  Skin Rashes- No Rashes.  HEENT Head- Normal. Ear Auditory Canal - Left- Normal. Right - Normal.Tympanic Membrane- Left- Normal. Right- Normal. Eye Sclera/Conjunctiva- Left- Normal. Right- Normal. Nose & Sinuses Nasal Mucosa- Left-  Boggy +Congested. Right-  Boggy + Congested.maxillary sinus pressure. Mouth & Throat Lips: Upper Lip- Normal: no dryness, cracking, pallor, cyanosis, or vesicular eruption. Lower Lip-Normal: no dryness, cracking, pallor, cyanosis or vesicular eruption. Buccal Mucosa- Bilateral- No Aphthous ulcers. Oropharynx- No Discharge or Erythema. Tonsils: Characteristics- Bilateral- No Erythema or Congestion. Size/Enlargement- Bilateral- No enlargement. Discharge- bilateral-None.  Neck Neck- Supple. No Masses.   Chest and Lung Exam Auscultation: Breath Sounds:-Normal, clear, even and  unlabored.  Cardiovascular Auscultation:Rythm- Regular, rate and rhythm. Murmurs & Other Heart Sounds:Ausculatation of the heart reveal- No Murmurs.  Lymphatic Head & Neck General Head & Neck Lymphatics: Bilateral: Description- No Localized lymphadenopathy.         Assessment & Plan:

## 2013-10-24 NOTE — Assessment & Plan Note (Signed)
Rx augmentin. Pt states allergist usually gives for 14 days. So I rx'd same duration. Advised use probiotics.

## 2013-10-24 NOTE — Telephone Encounter (Signed)
Called in Prednisone 10mg  to [place] #9 tablets 4 tab po daily day 1, 3 tablets daily day 2, 1 tablet po day 3-4 then stop.  Augmentin 875mg  BID x 14 days #28 tabs .

## 2013-10-24 NOTE — Patient Instructions (Addendum)
For your allergies and possible sinus infection I rx'd  Short taper prednisone and augmentin antibiotic. If your symptoms persist or worsen notify us. Continue with your nasal sprays. Follow up in 1 wk or as needed.  Pt may have some eustachian tube dysfunction as well. TM do look normal.

## 2013-11-04 ENCOUNTER — Other Ambulatory Visit: Payer: Self-pay | Admitting: Family

## 2013-11-04 ENCOUNTER — Other Ambulatory Visit: Payer: Self-pay | Admitting: Family Medicine

## 2013-11-04 NOTE — Telephone Encounter (Signed)
Rx request to pharmacy/SLS  

## 2013-11-12 ENCOUNTER — Other Ambulatory Visit: Payer: Self-pay | Admitting: Family

## 2013-11-13 NOTE — Telephone Encounter (Signed)
Rx printed and forwarded to Provider for signature.  Medication name:  Name from pharmacy:  ALPRAZolam (XANAX) 0.5 MG tablet  ALPRAZOLAM 0.5MG  TABLETS Sig: TAKE 1 TABLET BY MOUTH TWICE DAILY Dispense: 60 tablet Refills: 0 Start: 11/12/2013 Class: Normal Requested on: 11/12/2013 Originally ordered on: 05/10/2010 Last refill: 09/30/2013

## 2013-11-15 NOTE — Telephone Encounter (Signed)
Rx faxed to pharmacy on 11/13/13.

## 2013-11-19 NOTE — Telephone Encounter (Signed)
eScribe request from [facility] for refill on Alprazolam: Medication Detail      Disp Refills Start End     ALPRAZolam (XANAX) 0.5 MG tablet 60 tablet 0 11/13/2013     Sig: TAKE 1 TABLET BY MOUTH TWICE DAILY    Class: Print    Pharmacy    [id number] - [place], [place] - [address] AT [address]  Rx request Denied, Too Soon for request/SLS

## 2013-12-05 ENCOUNTER — Other Ambulatory Visit: Payer: Self-pay | Admitting: Family

## 2013-12-16 ENCOUNTER — Encounter: Payer: Self-pay | Admitting: Medical

## 2013-12-27 ENCOUNTER — Other Ambulatory Visit: Payer: Self-pay | Admitting: Family

## 2013-12-27 NOTE — Telephone Encounter (Signed)
Ok to do 30 day but needs OV prior to additional refills please.

## 2013-12-27 NOTE — Telephone Encounter (Signed)
Refill sent as below.  Please call pt to arrange f/u before further refills are due.

## 2013-12-27 NOTE — Telephone Encounter (Signed)
Pt was due for follow up in August and is past due. Is it ok to do 2 week supply or 30 days?    Medication name:  Name from pharmacy:  PARoxetine (PAXIL) 30 MG tablet PAROXETINE 30MG  TABLETS     Sig: TAKE 1 TABLET BY MOUTH EVERY MORNING    Dispense: 30 tablet   Refills: 0   Start: 12/27/2013   Class: Normal    Requested on: 12/27/2013    Originally ordered on: 06/15/2012 11/29/2013       Medication name:  Name from pharmacy:  ALPRAZolam (XANAX) 0.5 MG tablet ALPRAZOLAM 0.5MG  TABLETS     Sig: TAKE 1 TABLET BY MOUTH TWICE DAILY    Dispense: 60 tablet   Refills: 0   Start: 12/27/2013   Class: Normal    Requested on: 12/27/2013    Originally ordered on: 05/10/2010 11/13/2013

## 2014-01-02 ENCOUNTER — Other Ambulatory Visit: Payer: Self-pay | Admitting: Family

## 2014-01-07 HISTORY — PX: FOOT SURGERY: SHX648

## 2014-01-28 ENCOUNTER — Other Ambulatory Visit: Payer: Self-pay | Admitting: Family

## 2014-01-29 NOTE — Telephone Encounter (Signed)
Alprazolam, #60 x no refills called to pharmacy voicemail.  Rx last called in 12/27/13 and pt was supposed to have been contacted to f/u before further refills were due.  Pt was not called. Please call pt to arrange f/u soon.

## 2014-01-29 NOTE — Telephone Encounter (Signed)
Informed patient of med refill and she scheduled appontment

## 2014-02-10 ENCOUNTER — Other Ambulatory Visit: Payer: Self-pay | Admitting: Family

## 2014-02-10 ENCOUNTER — Other Ambulatory Visit: Payer: Self-pay

## 2014-02-10 MED ORDER — FERROUS SULFATE 325 (65 FE) MG PO TABS: 325.0000 mg | ORAL_TABLET | Freq: Two times a day (BID) | ORAL | Status: DC

## 2014-02-10 NOTE — Telephone Encounter (Signed)
Refilled to [facility] for 30 days only.

## 2014-02-10 NOTE — Telephone Encounter (Signed)
Caller name: [facility]  Relation to pt: other  Call back number: [phone]   Reason for call:   pharmacy called requesting a refill ferrous sulfate 325 (65 FE) MG tablet

## 2014-02-18 ENCOUNTER — Encounter: Payer: Self-pay | Admitting: Family

## 2014-02-18 ENCOUNTER — Ambulatory Visit (INDEPENDENT_AMBULATORY_CARE_PROVIDER_SITE_OTHER): Payer: BLUE CROSS/BLUE SHIELD | Admitting: Family

## 2014-02-18 VITALS — BP 110/84 | HR 92 | Temp 98.3°F | Resp 16 | Ht 65.0 in

## 2014-02-18 DIAGNOSIS — F411 Generalized anxiety disorder: Secondary | ICD-10-CM

## 2014-02-18 DIAGNOSIS — Z5181 Encounter for therapeutic drug level monitoring: Secondary | ICD-10-CM

## 2014-02-18 DIAGNOSIS — B351 Tinea unguium: Secondary | ICD-10-CM

## 2014-02-18 MED ORDER — ALPRAZOLAM 0.5 MG PO TABS: 0.5000 mg | ORAL_TABLET | Freq: Two times a day (BID) | ORAL | Status: DC

## 2014-02-18 MED ORDER — TERBINAFINE HCL 250 MG PO TABS: 250.0000 mg | ORAL_TABLET | Freq: Every day | ORAL | Status: DC

## 2014-02-18 MED ORDER — PAROXETINE HCL 30 MG PO TABS: 30.0000 mg | ORAL_TABLET | Freq: Every morning | ORAL | Status: DC

## 2014-02-18 NOTE — Progress Notes (Signed)
Subjective:    Patient ID: [name] [name], female    DOB: [birth year], 59 y.o.   MRN: [id number]  HPI [name] [name] is here today for follow up.  1. Foot Surgery - Had foot surgery for arthritis November 24. Doing well. Using scooter and has a cast. Has follow up appointment is this Friday to have cast removed.Pain is improved since surgery, Now pain is intermittent and is 3/10. Taking tylenol arthritis for pain and this provides relief and vicodin if needed.  2. Toenail fungus on both great toes- has had fungus since 09/2013. Would like tx for this.  3. Anxiety - doing well on paxil and xanax at night for sleep. Takes xanax every night.   Review of Systems  Gastrointestinal: Negative for abdominal pain, diarrhea and constipation.   Past Medical History  Diagnosis Date  . GERD (gastroesophageal reflux disease)   . Allergy   . Osteoporosis   . Obesity     status post bariatric procedure in [facility] 2005  . Elevated alkaline phosphatase level     negative workup (presumed secondary to fatty liver)  . Thyroid nodule 7/10    `  . Interstitial cystitis     Dr Vernie Shanks  . 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 Thyroid nodule   . Anxiety   . Varicose vein of leg   . Neuromuscular disorder     rt arm carpal tunnel syndrome  . Frequency-urgency syndrome   . Arthritis   . Anemia     takes iron supplement    History   Social History  . Marital Status: Divorced    Spouse Name: N/A    Number of Children: 1  . Years of Education: N/A   Occupational History  . MANAGER    Social History Main Topics  . Smoking status: Never Smoker   . Smokeless tobacco: Never Used  . Alcohol Use: 0.6 oz/week    1 Glasses of wine per week     Comment: RARE  . Drug Use: No  . Sexual Activity: Yes   Other Topics Concern  . Not on file   Social History Narrative    Past Surgical History  Procedure Laterality Date  . Joint replacement  2008    left total knee  . Stomach surgery  2007      "tummy tuck"  . Gastric bypass  2005  . Eye surgery  1997    bilateral cataract extraction  . Knee surgery  1997    left knee  . Refractive surgery  2006    lasik  . Nasal sinus surgery  5465    Dr Erik Obey  . Cryotherapy  1980  . Colposcopy  1980  . Shoulder surgery  2 2012    RIGHT   . Foot surgery      bilateral bunionettes  . Cysto with hydrodistension  12/12/2011    Procedure: CYSTOSCOPY/HYDRODISTENSION;  Surgeon: Franchot Gallo, MD;  Location: [facility];  Service: Urology;  Laterality: N/A;  30 MIN   . Bladder surgery  02/21/12 and 02/28/12    Dr Diona Fanti  . Varicose vein surgery Right 02/2012    leg  . Anterior cervical decomp/discectomy fusion N/A 06/21/2012    Procedure: ANTERIOR CERVICAL DECOMPRESSION/DISCECTOMY FUSION C-4 - C5 (SPACER/DePUY CERVICAL PLATE ONLY) 1 LEVEL;  Surgeon: Melina Schools, MD;  Location: [place];  Service: Orthopedics;  Laterality: N/A;  . Foot surgery Right 01/07/14    Pt states surgery  was due to arthritis. "Has pins in place now"    Family History  Problem Relation Age of Onset  . Diabetes Paternal Grandfather   . Hyperlipidemia Mother   . Hypertension Mother   . Hypertension Father   . Diabetes Father   . Prostate cancer Father   . Heart disease Maternal Grandmother   . Hypertension Maternal Grandmother   . Heart disease Maternal Grandfather   . Hypertension Maternal Grandfather   . Stroke Maternal Grandfather   . Stroke Paternal Grandfather     Allergies  Allergen Reactions  . Codeine Itching    REACTION: hives  . Cortisone     REACTION: palpitations  . 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  No allergy.  . Nitrofurantoin     REACTION: fever  . Prednisone     REACTION: palpitations    Current Outpatient Prescriptions on File Prior to Visit  Medication Sig Dispense Refill  . Azelastine HCl (ASTEPRO) 0.15 % SOLN 1 spray by Each Nare route 2 (two) times daily.      .  Bepotastine Besilate (BEPREVE) 1.5 % SOLN Apply 1 drop to eye 2 (two) times daily as needed (allergies).     . calcium elemental as carbonate (TUMS ULTRA 1000) 400 MG tablet Chew 1,000 mg by mouth 2 (two) times daily.      . Cholecalciferol (VITAMIN D) 2000 UNITS CAPS Take 2,000 Units by mouth 2 (two) times daily.    . ferrous sulfate 325 (65 FE) MG tablet Take 1 tablet (325 mg total) by mouth 2 (two) times daily. 60 tablet 0  . fexofenadine-pseudoephedrine (ALLEGRA-D 24) 180-240 MG per 24 hr tablet Take 1 tablet by mouth daily.    Marland Kitchen HYDROcodone-acetaminophen (NORCO) 10-325 MG per tablet Take 1 tablet by mouth every 6 (six) hours as needed for pain. 60 tablet 0  . Methen-Hyosc-Meth Blue-Na Phos (UTA) 120 MG CAPS As needed for dysuria.    . mometasone (NASONEX) 50 MCG/ACT nasal spray Place 1 spray into the nose 2 (two) times daily.     . Multiple Vitamin (MULTIVITAMIN) tablet Take 1 tablet by mouth 2 (two) times daily.      Marland Kitchen MYRBETRIQ 50 MG TB24 Take 50 mg by mouth daily.    . Omega-3 Fatty Acids (FISH OIL) 1000 MG CAPS Take 1 capsule by mouth 2 (two) times daily.      Marland Kitchen omeprazole (PRILOSEC) 40 MG capsule Take 40 mg by mouth 2 (two) times daily.      . valACYclovir (VALTREX) 500 MG tablet TAKE 1 TABLET BY MOUTH EVERY DAY. NEEDS APPT 30 tablet 0  . VITAMIN A PO Take 1 tablet by mouth daily.      No current facility-administered medications on file prior to visit.    BP 110/84 mmHg  Pulse 92  Temp(Src) 98.3 F (36.8 C) (Oral)  Resp 16  Ht 5\' 5"  (1.651 m)  Wt   SpO2 98%      Objective:   Physical Exam  Constitutional: She is oriented to person, place, and time. She appears well-developed and well-nourished. No distress.  HENT:  Head: Normocephalic and atraumatic.  Cardiovascular: Normal rate, regular rhythm and normal heart sounds.  Exam reveals no gallop and no friction rub.   No murmur heard. Pulmonary/Chest: Effort normal and breath sounds normal. No respiratory distress. She has  no wheezes. She has no rales.  Musculoskeletal: She exhibits no edema.  Right foot in cast.  Good sensation, circulation and mobility of toes.  Neurological: She is alert and oriented to person, place, and time. She has normal reflexes.  Skin: Skin is warm and dry. She is not diaphoretic.  Bilateral great toenails thickened and peeling. Skin on right foot very dry and peeling.  Psychiatric: She has a normal mood and affect. Her behavior is normal. Judgment and thought content normal.          Assessment & Plan:  Patient seen along with [facility] NP-student.  I have personally seen and examined patient and agree with [name] [name]'s assessment and plan.

## 2014-02-18 NOTE — Assessment & Plan Note (Addendum)
Doing well on paxil and xanax nightly for sleep. Continue same.controlled substance contract is signed today. Will obtain UDS today.

## 2014-02-18 NOTE — Progress Notes (Signed)
Pre visit review using our clinic review tool, if applicable. No additional management support is needed unless otherwise documented below in the visit note. 

## 2014-02-18 NOTE — Patient Instructions (Signed)
Please complete your lab work prior to leaving (UDS). Start lamisil for your tonenails. Follow up in 6 weeks for repeat lab work.  Follow up in 3 months for office visit.

## 2014-02-18 NOTE — Assessment & Plan Note (Signed)
Onychomycosis both great toes and right forefoot. Begin terbinafine and draw LFT in 6 weeks. Follow up in 3 months.

## 2014-02-27 ENCOUNTER — Other Ambulatory Visit: Payer: Self-pay | Admitting: Family

## 2014-02-28 NOTE — Telephone Encounter (Signed)
Ok to send 60 tabs zero refills. 

## 2014-02-28 NOTE — Telephone Encounter (Signed)
Rx called to pharmacy voicemail. 

## 2014-03-11 ENCOUNTER — Encounter: Payer: Self-pay | Admitting: Family

## 2014-03-14 ENCOUNTER — Ambulatory Visit (INDEPENDENT_AMBULATORY_CARE_PROVIDER_SITE_OTHER): Payer: BLUE CROSS/BLUE SHIELD | Admitting: Medical

## 2014-03-14 ENCOUNTER — Encounter: Payer: Self-pay | Admitting: Medical

## 2014-03-14 VITALS — BP 132/82 | HR 99 | Temp 98.8°F | Ht 65.0 in

## 2014-03-14 DIAGNOSIS — J0101 Acute recurrent maxillary sinusitis: Secondary | ICD-10-CM

## 2014-03-14 DIAGNOSIS — J0100 Acute maxillary sinusitis, unspecified: Secondary | ICD-10-CM | POA: Insufficient documentation

## 2014-03-14 MED ORDER — AMOXICILLIN-POT CLAVULANATE 875-125 MG PO TABS: 1.0000 | ORAL_TABLET | Freq: Two times a day (BID) | ORAL | Status: DC

## 2014-03-14 MED ORDER — PREDNISONE 20 MG PO TABS: ORAL_TABLET | ORAL | Status: DC

## 2014-03-14 NOTE — Progress Notes (Signed)
Subjective:    Patient ID: [name] [name], female    DOB: 08-12-1955, 59 y.o.   MRN: [id number]  HPI   Pt states around new years had sneezing, and nasal congestion for 3 weeks. Gradually sinus pressure. But moderate over 3 weeks. Monday sinus pressure severe side. Pt has some chronic allergies.  Hx of sinusitis.  See below ros    Review of Systems  Constitutional: Negative for fever, chills and fatigue.  HENT: Positive for congestion, postnasal drip, rhinorrhea and sinus pressure.        Rt side sinus is worse. Feels clogged.   Respiratory: Negative for choking and shortness of breath.   Cardiovascular: Negative for chest pain and palpitations.  Musculoskeletal: Negative for back pain.  Neurological: Negative for dizziness, tremors, speech difficulty, weakness and numbness.  Hematological: Negative for adenopathy. Does not bruise/bleed easily.  Psychiatric/Behavioral: Negative for behavioral problems, confusion and decreased concentration.   Past Medical History  Diagnosis Date  . GERD (gastroesophageal reflux disease)   . Allergy   . Osteoporosis   . Obesity     status post bariatric procedure in [facility] 2005  . Elevated alkaline phosphatase level     negative workup (presumed secondary to fatty liver)  . Thyroid nodule 7/10    `  . Interstitial cystitis     Dr Vernie Shanks  . 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 Thyroid nodule   . Anxiety   . Varicose vein of leg   . Neuromuscular disorder     rt arm carpal tunnel syndrome  . Frequency-urgency syndrome   . Arthritis   . Anemia     takes iron supplement    History   Social History  . Marital Status: Divorced    Spouse Name: N/A    Number of Children: 1  . Years of Education: N/A   Occupational History  . MANAGER    Social History Main Topics  . Smoking status: Never Smoker   . Smokeless tobacco: Never Used  . Alcohol Use: 0.6 oz/week    1 Glasses of wine per week     Comment: RARE  . Drug Use: No    . Sexual Activity: Yes   Other Topics Concern  . Not on file   Social History Narrative    Past Surgical History  Procedure Laterality Date  . Joint replacement  2008    left total knee  . Stomach surgery  2007    "tummy tuck"  . Gastric bypass  2005  . Eye surgery  1997    bilateral cataract extraction  . Knee surgery  1997    left knee  . Refractive surgery  2006    lasik  . Nasal sinus surgery  6812    Dr Erik Obey  . Cryotherapy  1980  . Colposcopy  1980  . Shoulder surgery  2 2012    RIGHT   . Foot surgery      bilateral bunionettes  . Cysto with hydrodistension  12/12/2011    Procedure: CYSTOSCOPY/HYDRODISTENSION;  Surgeon: Franchot Gallo, MD;  Location: [facility];  Service: Urology;  Laterality: N/A;  30 MIN   . Bladder surgery  02/21/12 and 02/28/12    Dr Diona Fanti  . Varicose vein surgery Right 02/2012    leg  . Anterior cervical decomp/discectomy fusion N/A 06/21/2012    Procedure: ANTERIOR CERVICAL DECOMPRESSION/DISCECTOMY FUSION C-4 - C5 (SPACER/DePUY CERVICAL PLATE ONLY) 1 LEVEL;  Surgeon: Melina Schools, MD;  Location: [place];  Service: Orthopedics;  Laterality: N/A;  . Foot surgery Right 01/07/14    Pt states surgery was due to arthritis. "Has pins in place now"    Family History  Problem Relation Age of Onset  . Diabetes Paternal Grandfather   . Hyperlipidemia Mother   . Hypertension Mother   . Hypertension Father   . Diabetes Father   . Prostate cancer Father   . Heart disease Maternal Grandmother   . Hypertension Maternal Grandmother   . Heart disease Maternal Grandfather   . Hypertension Maternal Grandfather   . Stroke Maternal Grandfather   . Stroke Paternal Grandfather     Allergies  Allergen Reactions  . Codeine Itching    REACTION: hives  . Cortisone     REACTION: palpitations  . 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  No allergy.  . Nitrofurantoin     REACTION: fever  .  Prednisone     REACTION: palpitations    Current Outpatient Prescriptions on File Prior to Visit  Medication Sig Dispense Refill  . ALPRAZolam (XANAX) 0.5 MG tablet TAKE 1 TABLET BY MOUTH TWICE DAILY 60 tablet 0  . Azelastine HCl (ASTEPRO) 0.15 % SOLN 1 spray by Each Nare route 2 (two) times daily.      . Bepotastine Besilate (BEPREVE) 1.5 % SOLN Apply 1 drop to eye 2 (two) times daily as needed (allergies).     Marland Kitchen BIOTIN PO Take 1 tablet by mouth daily.    . calcium elemental as carbonate (TUMS ULTRA 1000) 400 MG tablet Chew 1,000 mg by mouth 2 (two) times daily.      . Cholecalciferol (VITAMIN D) 2000 UNITS CAPS Take 2,000 Units by mouth 2 (two) times daily.    . ferrous sulfate 325 (65 FE) MG tablet Take 1 tablet (325 mg total) by mouth 2 (two) times daily. 60 tablet 0  . fexofenadine-pseudoephedrine (ALLEGRA-D 24) 180-240 MG per 24 hr tablet Take 1 tablet by mouth daily.    Marland Kitchen HYDROcodone-acetaminophen (NORCO) 10-325 MG per tablet Take 1 tablet by mouth every 6 (six) hours as needed for pain. 60 tablet 0  . MAGNESIUM PO Take 1 tablet by mouth daily.    . Methen-Hyosc-Meth Blue-Na Phos (UTA) 120 MG CAPS As needed for dysuria.    . mometasone (NASONEX) 50 MCG/ACT nasal spray Place 1 spray into the nose 2 (two) times daily.     . Multiple Vitamin (MULTIVITAMIN) tablet Take 1 tablet by mouth 2 (two) times daily.      Marland Kitchen MYRBETRIQ 50 MG TB24 Take 50 mg by mouth daily.    . Omega-3 Fatty Acids (FISH OIL) 1000 MG CAPS Take 1 capsule by mouth 2 (two) times daily.      Marland Kitchen omeprazole (PRILOSEC) 40 MG capsule Take 40 mg by mouth 2 (two) times daily.      Marland Kitchen PARoxetine (PAXIL) 30 MG tablet Take 1 tablet (30 mg total) by mouth every morning. 30 tablet 5  . terbinafine (LAMISIL) 250 MG tablet Take 1 tablet (250 mg total) by mouth daily. 30 tablet 1  . valACYclovir (VALTREX) 500 MG tablet TAKE 1 TABLET BY MOUTH EVERY DAY. NEEDS APPT 30 tablet 0  . VITAMIN A PO Take 1 tablet by mouth daily.      No current  facility-administered medications on file prior to visit.    BP 132/82 mmHg  Pulse 99  Temp(Src) 98.8 F (37.1 C) (Oral)  Ht 5\' 5"  (1.651 m)  Wt   SpO2 94%      Objective:   Physical Exam    General  Mental Status - Alert. General Appearance - Well groomed. Not in acute distress.  Skin Rashes- No Rashes.  HEENT Head- Normal. Ear Auditory Canal - Left- Normal. Right - Normal.Tympanic Membrane- Left- Normal. Right- Normal. Eye Sclera/Conjunctiva- Left- Normal. Right- Normal. Nose & Sinuses Nasal Mucosa- Left-  Boggy and Congested. Right-  Boggy and  Congested.Bilateral maxillary and frontal sinus pressure. Mouth & Throat Lips: Upper Lip- Normal: no dryness, cracking, pallor, cyanosis, or vesicular eruption. Lower Lip-Normal: no dryness, cracking, pallor, cyanosis or vesicular eruption. Buccal Mucosa- Bilateral- No Aphthous ulcers. Oropharynx- No Discharge or Erythema. Tonsils: Characteristics- Bilateral- No Erythema or Congestion. Size/Enlargement- Bilateral- No enlargement. Discharge- bilateral-None.  Neck Neck- Supple. No Masses.   Chest and Lung Exam Auscultation: Breath Sounds:-Clear even and unlabored.  Cardiovascular Auscultation:Rythm- Regular, rate and rhythm. Murmurs & Other Heart Sounds:Ausculatation of the heart reveal- No Murmurs.  Lymphatic Head & Neck General Head & Neck Lymphatics: Bilateral: Description- No Localized lymphadenopathy.        Assessment & Plan:

## 2014-03-14 NOTE — Progress Notes (Signed)
Pre visit review using our clinic review tool, if applicable. No additional management support is needed unless otherwise documented below in the visit note. 

## 2014-03-14 NOTE — Assessment & Plan Note (Signed)
Your appear to have a sinus infection with underlying allergic rhinitis. I am prescribing antibiotic augmentin for the infection. To help with the nasal congestion continue your nasonex.   For your nasal congestion you could use low dose prednisone as well. I am making that available at your request. But if at any point you feel like worsening compared to now we need to see you and consider other med such as levaquin

## 2014-03-14 NOTE — Patient Instructions (Addendum)
Your appear to have a sinus infection with underlying allergic rhinitis. I am prescribing antibiotic augmentin for the infection. To help with the nasal congestion continue your nasonex.   For your nasal congestion you could use low dose prednisone as well. I am making that available at your request. But if at any point you feel like worsening compared to now we need to see you and consider other med such as levaquin.  Rest, hydrate, tylenol for fever.  Follow up in 7 days or as needed.  Pt explained to me she has used prednisone various time recently and no reaction.

## 2014-04-07 ENCOUNTER — Other Ambulatory Visit (INDEPENDENT_AMBULATORY_CARE_PROVIDER_SITE_OTHER): Payer: BLUE CROSS/BLUE SHIELD

## 2014-04-07 DIAGNOSIS — Z5181 Encounter for therapeutic drug level monitoring: Secondary | ICD-10-CM

## 2014-04-07 LAB — HEPATIC FUNCTION PANEL
ALBUMIN: 3.9 g/dL (ref 3.5–5.2)
ALK PHOS: 99 U/L (ref 39–117)
ALT: 20 U/L (ref 0–35)
AST: 19 U/L (ref 0–37)
BILIRUBIN DIRECT: 0.1 mg/dL (ref 0.0–0.3)
Total Bilirubin: 0.3 mg/dL (ref 0.2–1.2)
Total Protein: 6.6 g/dL (ref 6.0–8.3)

## 2014-04-08 ENCOUNTER — Encounter: Payer: Self-pay | Admitting: Family

## 2014-04-08 MED ORDER — TERBINAFINE HCL 250 MG PO TABS: 250.0000 mg | ORAL_TABLET | Freq: Every day | ORAL | Status: DC

## 2014-04-11 ENCOUNTER — Other Ambulatory Visit: Payer: Self-pay | Admitting: Family

## 2014-04-21 ENCOUNTER — Other Ambulatory Visit: Payer: Self-pay | Admitting: Family

## 2014-04-21 NOTE — Telephone Encounter (Signed)
Med filled.  Follow up appointment scheduled for 05/23/14.

## 2014-04-22 ENCOUNTER — Telehealth: Payer: Self-pay | Admitting: Family

## 2014-04-22 NOTE — Telephone Encounter (Signed)
Spoke with [name] [name] at [facility] and he states they received rx from 04/21/14 but insurance will not pay for med until 05/14/14.  Review of EPIC shows Rx was sent to [name] [name] on 04/08/14 and [name] [name] confirms that rx was filled on 04/18/14. Rx has not been picked up at [facility] yet and copay will be $3.73 at [facility]. Spoke with pt and she states she is at home sick and has not been able to get [facility] lam[facility]il rx. Has abx at [facility] that husband will pick up for her today and [facility] to [facility] as well. Cancelled rx at [facility] and notified Brad to fill rx from 04/21/14 at [facility].

## 2014-04-22 NOTE — Telephone Encounter (Signed)
Caller name: Toniyah Relation to pt: self Call back number: [phone] Pharmacy: [facility] [address]  Reason for call:   Patient states that [facility] did not receive lamisil refill. Please refill

## 2014-04-30 ENCOUNTER — Other Ambulatory Visit: Payer: Self-pay | Admitting: Family

## 2014-04-30 NOTE — Telephone Encounter (Signed)
Pt has f/u with PCP 05/23/14. Please advise.  Medication name:  Name from pharmacy:  ALPRAZolam (XANAX) 0.5 MG tablet ALPRAZOLAM 0.5MG  TABLETS     Sig: TAKE 1 TABLET BY MOUTH TWICE DAILY    Dispense: 60 tablet   Refills: 0   Start: 04/30/2014   Class: Normal    Requested on: 04/30/2014    Originally ordered on: 05/10/2010 03/28/2014

## 2014-05-02 NOTE — Telephone Encounter (Signed)
Ok to send 60 tabs zero refills. 

## 2014-05-02 NOTE — Telephone Encounter (Signed)
Rx called to pharmacy voicemail. 

## 2014-05-23 ENCOUNTER — Ambulatory Visit: Payer: BLUE CROSS/BLUE SHIELD | Admitting: Family

## 2014-05-25 ENCOUNTER — Other Ambulatory Visit: Payer: Self-pay | Admitting: Family Medicine

## 2014-05-25 ENCOUNTER — Other Ambulatory Visit: Payer: Self-pay | Admitting: Family

## 2014-05-26 ENCOUNTER — Other Ambulatory Visit (INDEPENDENT_AMBULATORY_CARE_PROVIDER_SITE_OTHER): Payer: BLUE CROSS/BLUE SHIELD

## 2014-05-26 ENCOUNTER — Encounter: Payer: Self-pay | Admitting: Family

## 2014-05-26 DIAGNOSIS — D649 Anemia, unspecified: Secondary | ICD-10-CM

## 2014-05-26 LAB — FECAL OCCULT BLOOD, IMMUNOCHEMICAL: FECAL OCCULT BLD: NEGATIVE

## 2014-05-26 NOTE — Telephone Encounter (Signed)
Ok to send 60 tabs zero refills. 

## 2014-05-26 NOTE — Telephone Encounter (Signed)
Last Rx given 05/02/14. Pt last seen 02/2014 and cancelled 3 month f/u on 05/20/14.  Please advise?  Medication name:  Name from pharmacy:  ALPRAZolam (XANAX) 0.5 MG tablet ALPRAZOLAM 0.5MG  TABLETS     Sig: TAKE 1 TABLET BY MOUTH TWICE DAILY    Dispense: 60 tablet   Refills: 0   Start: 05/25/2014   Class: Normal    Requested on: 05/25/2014    Originally ordered on: 05/10/2010 05/02/2014

## 2014-05-27 ENCOUNTER — Telehealth: Payer: Self-pay | Admitting: *Deleted

## 2014-05-27 NOTE — Telephone Encounter (Signed)
See 05/25/14 refill note.

## 2014-05-27 NOTE — Telephone Encounter (Signed)
rx refill- xanax 0.5mg  Last OV- 02/18/14 Last refilled- 05/02/14 # 60 / 0 rf UDS- 02/28/14 Moderate risk.

## 2014-05-27 NOTE — Telephone Encounter (Signed)
Ok to send 60 tabs zero refills. 

## 2014-05-27 NOTE — Telephone Encounter (Signed)
Rx called to pharmacy voicemail. 

## 2014-06-24 ENCOUNTER — Other Ambulatory Visit: Payer: Self-pay | Admitting: Family

## 2014-06-24 NOTE — Telephone Encounter (Signed)
[name] [name], [name] [name] Female, 59 y.o., 03-Nov-1955 Last Weight:  242 lb 12.8 oz (110.133 kg) Phone:  [phone] PCP:  [name] Language:  English Need Interp:  No AllergiesCodeine Cortisone Ibuprofen Nitrofurantoin Prednisone Health Maintenance:  Due FYIGeneral Primary Ins:  629 MRN:  [id number] MyChart:  Code Exp Next Appt:  None Taleia, Sadowski - 06/24/2014 4:48 PM >','<< Less Detail',event)" href="javascript:;">More Detail >>   Rx Auth Request    Surescripts Out Interface    Sent: Tue Jun 24, 2014 4:48 PM    To: Rise Paganini Rx Refill        Message     ----- Message from [facility] sent at 06/24/2014 4:48 PM EDT -----        The demographic information from the pharmacy is:    Patient Name: [name], [name]    Patient DOB: April 27, 1955    Patient Gender: Female    Address:     [id number] [place]     [place]  59 y.o. / Female (April 02, 1955)  Pharmacy: [id number] - [place] - [address] Ph: [phone]   MRN: [id number]  PCP: [name], NP  Wt: 242 lb 12.8 oz (110.133 kg) (10/24/2013)   Home: [phone]  Work: [phone]  Mobile: [phone]       Guarantor Account: [name], Ament [name] ([id number])     Relation to Patient: Account Type Service Area    Self Personal/Family [place] for This Account     Coverage ID Payor Plan Insurance ID    [id number] [facility] Oren Binet SHIELD BCBS OTHER [id number]        Guarantor Account: [name], Robledo [name] ([id number])     Relation to Patient: Account Type Service Area    Self Personal/Family [place] for This Account     Coverage ID [place] ID    [phone] Gard[employer] Barefoot [id number]          Guarantor Account: [name], Kaczorowski [name] ([id number])     Relation to Patient: Account Type Service Area    Self Personal/Family [facility]          Guarantor Account: [name], Doffing [name] ([id number])     Relation to Patient: Account Type Service Area    Self Personal/Family [facility]          Guarantor Account: [name], Kuck [name] ([id number])     Relation to Patient: Account Type Service Area    Self Personal/Family [place] [facility]          Medication Refill     [facility] Refill  11 minutes ago   (4:48 PM)    [facility] [phone]   Surescripts Out Interface  11 minutes ago   (4:48 PM)         New medications from outside sources are available for reconciliation.       Requested Medications     Medication name:  Name from pharmacy:  ALPRAZolam (XANAX) 0.5 MG tablet ALPRAZOLAM 0.5MG  TABLETS    Sig: TAKE 1 TABLET BY MOUTH TWICE DAILY    Dispense: 60 tablet   Refills:  0   Start: 06/24/2014   Class: Normal    Requested on: 06/24/2014    Originally ordered on: 05/10/2010 05/28/2014 Order History and Details           Medication name:  Name from pharmacy:  ferrous sulfate 325 (65 FE) MG tablet FERROUS SULFATE 325MG  (5GR) TABS    Sig: TAKE 1 TABLET BY MOUTH TWICE DAILY    Dispense: 60 tablet   Refills: 0   Start: 06/24/2014   Class: Normal    Requested on: 06/24/2014    Originally ordered on: 03/09/2011 05/26/2014 Order History and Details      Call Documentation     No notes of this type exist for this encounter.     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 No allergy.    Nitrofurantoin 05/14/2009       REACTION: fever    Prednisone       REACTION: palpitations      Patient Flags     Flag Type Author Status Filed    General Amy Diona Fanti Active 02/18/2014 3:19 PM    02/18/14 no control substance contract signed, uds sample given,    Moderate risk, next screen 05/20/14.        General Sinclair Grooms Active 10/23/2013 11:22 AM    GGA/chart 1 box 1577          Outstanding Procedures      Open Future Orders       Priority Expected Expires Ordered    MM DIGITAL SCREENING BILATERAL Routine  08/04/2014 06/03/2013          Past Appointments     Date Time Status Provider Department Type Appt Notes    05/26/2014 3:20 PM Comp [facility]10     05/23/2014 8:00 AM Can [name], NP [facility] FU15 follow up for onychomycosis/asl--confirmed and asked to bring meds    04/07/2014 8:00 AM Comp [facility] LAB [facility] lab/melissa/ss    03/14/2014 8:15 AM Comp Meriam Sprague Saguier, PA-C [facility] ACUTE ? sinus infection     02/18/2014 8:15 AM Comp [name], NP [facility] FU15 med refill/ss    10/24/2013 2:15 PM Comp Meriam Sprague Saguier, PA-C [facility] ACUTE congestion, nasal draining down throat, hoarse , head ache, ears hurt / bcbs / tbell    09/18/2013 8:00 AM Comp Brunetta Jeans, PA-C [facility] ACUTE PAIN IN LEFT LOWER ABD    06/28/2013 7:15 AM Comp [name], NP [facility] OV30 cpe/pap/med refill/ss    06/17/2013 8:20 AM Comp MHP-MM 1 MHP-MM MMDIGITS20 APPMT CONFIRMED 06/14/13 [facility]    02/05/2013 8:30 AM Comp [name], NP [facility] ACUTE congestion--tf,cma      Pharmacy     [id number] - [place], [place] - [address] AT [place]    [address] [place] [address]    Phone: [phone] Fax: [phone]    Open 24 Hours?: No  [name] [name], [name] [name] Female, 59 y.o., [birth year] Last Weight:  242 lb 12.8 oz (110.133 kg) Phone:  [phone] PCP:  [name] Language:  English Need Interp:  No AllergiesCodeine Cortisone Ibuprofen Nitrofurantoin Prednisone Health Maintenance:  Due FYIGeneral Primary Ins:  381 MRN:  [id number] MyChart:  Code Exp Next Appt:  None [name], [name] - 06/24/2014 4:48 PM >','<< Less Detail',event)" href="javascript:;">More Detail >>   Rx Auth Request    Surescripts Out Interface    Sent: Tue Jun 24, 2014 4:48 PM    To: Rise Paganini Rx Refill        Message     ----- Message from [facility] sent at 06/24/2014 4:48 PM EDT -----        The demographic information from the pharmacy is:    Patient Name: [name] [name], [name] [name]    Patient DOB: Oct 23, 1955    Patient Gender: Female    Address:     [address] [place]     [place]  59 y.o. / Female ([birth year])  Pharmacy: [id number] - [place], [place] - [address] Ph: [phone]   MRN: [id number]  PCP: [name], NP  Wt: 242 lb 12.8 oz (110.133 kg) (10/24/2013)   Home: [phone]  Work: [phone]  Mobile: [phone]       Guarantor Account: Rhya, Shan [name] ([id number])     Relation to Patient: Account Type Service Area    Self Personal/Family [place] for This Account     Coverage ID Payor Plan Insurance ID    [id number] [facility] [place] Oren Binet SHIELD BCBS OTHER [id number]        Guarantor Account: Netasha, Wehrli [name] ([id number])     Relation to Patient: Account Type Service Area    Self Personal/Family [place] for This  Account     Coverage ID [place] ID    [id number] Gard[employer] Barefoot [id number]        Guarantor Account: Albertha, Beattie [name] ([id number])     Relation to Patient: Account Type Service Area    Self Personal/Family [facility]          Guarantor Account: Liv, Rallis [name] ([id number])     Relation to Patient: Account Type Service Area    Self Personal/Family [facility]          Guarantor Account: Abeeha, Twist [name] ([id number])     Relation to Patient: Account Type Service Area    Self Personal/Family [place] [facility]          Medication Refill     [facility] Refill  11 minutes ago   (4:48 PM)    [facility] [phone]   Surescripts Out Interface  11 minutes ago   (4:48 PM)         New medications from outside sources are available for reconciliation.       Requested Medications     Medication name:  Name from pharmacy:  ALPRAZolam (XANAX) 0.5 MG tablet ALPRAZOLAM 0.5MG  TABLETS    Sig: TAKE 1 TABLET BY MOUTH TWICE DAILY    Dispense: 60 tablet   Refills: 0  Start: 06/24/2014   Class: Normal    Requested on: 06/24/2014    Originally ordered on: 05/10/2010 05/28/2014 Order History and Details           Medication name:  Name from pharmacy:  ferrous sulfate 325 (65 FE) MG tablet FERROUS SULFATE 325MG  (5GR) TABS    Sig: TAKE 1 TABLET BY MOUTH TWICE DAILY    Dispense: 60 tablet   Refills: 0   Start: 06/24/2014   Class: Normal    Requested on: 06/24/2014    Originally ordered on: 03/09/2011 05/26/2014 Order History and Details      Call Documentation     No notes of this type exist for this encounter.     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 No allergy.    Nitrofurantoin 05/14/2009       REACTION: fever    Prednisone       REACTION: palpitations      Patient Flags     Flag Type Author Status Filed    General Amy Diona Fanti Active 02/18/2014 3:19 PM    02/18/14 no control substance contract signed, uds sample given,    Moderate risk, next screen 05/20/14.        General Sinclair Grooms Active 10/23/2013 11:22 AM    GGA/chart 1 box 1577          Outstanding Procedures      Open Future Orders       Priority Expected Expires Ordered    MM DIGITAL SCREENING BILATERAL Routine  08/04/2014 06/03/2013          Past Appointments     Date Time Status Provider Department Type Appt Notes    05/26/2014 3:20 PM Comp [facility]     05/23/2014 8:00 AM Can [name], NP [facility] FU15 follow up for onychomycosis/asl--confirmed and asked to bring meds    04/07/2014 8:00 AM Comp [facility] lab/melissa/ss    03/14/2014 8:15 AM Comp Meriam Sprague Saguier, PA-C [facility] ACUTE ? sinus infection     02/18/2014 8:15 AM Comp [name], NP [facility] FU15 med refill/ss    10/24/2013 2:15 PM Comp Meriam Sprague Saguier, PA-C [facility] ACUTE congestion, nasal draining down throat, hoarse , head ache, ears hurt / bcbs / tbell    09/18/2013 8:00 AM Comp Brunetta Jeans, PA-C [facility] ACUTE PAIN IN LEFT LOWER ABD    06/28/2013 7:15 AM Comp [name], NP [facility] OV30 cpe/pap/med refill/ss    06/17/2013 8:20 AM Comp MHP-MM 1 MHP-MM MMDIGITS20 APPMT CONFIRMED 06/14/13 [facility]    02/05/2013 8:30 AM Comp [name], NP [facility] ACUTE congestion--tf,cma      Pharmacy     [id number] - [place], [place] - [address] [place]    [address] [place]  [address]    Phone: [phone] Fax: [phone]    Open 24 Hours?: No     Last  alprazolam Rx 05/27/14. Current CSC and UDS on file.  Rx printed and forwarded to Provider for signature.

## 2014-06-25 NOTE — Telephone Encounter (Signed)
Rx faxed to pharmacy  

## 2014-07-18 ENCOUNTER — Other Ambulatory Visit: Payer: Self-pay | Admitting: Family

## 2014-07-18 DIAGNOSIS — Z1231 Encounter for screening mammogram for malignant neoplasm of breast: Secondary | ICD-10-CM

## 2014-07-22 ENCOUNTER — Ambulatory Visit (HOSPITAL_BASED_OUTPATIENT_CLINIC_OR_DEPARTMENT_OTHER)
Admission: RE | Admit: 2014-07-22 | Discharge: 2014-07-22 | Disposition: A | Discharge status: Home / Self Care | Payer: BLUE CROSS/BLUE SHIELD | Source: Ambulatory Visit | Attending: Family | Admitting: Family

## 2014-07-22 DIAGNOSIS — Z1231 Encounter for screening mammogram for malignant neoplasm of breast: Secondary | ICD-10-CM | POA: Insufficient documentation

## 2014-07-23 ENCOUNTER — Encounter: Payer: Self-pay | Admitting: Family

## 2014-07-23 ENCOUNTER — Other Ambulatory Visit: Payer: Self-pay | Admitting: Family

## 2014-07-23 NOTE — Telephone Encounter (Signed)
Left detailed message informing patient of med refills and that she needs to be seen before further refills can be given.

## 2014-07-23 NOTE — Telephone Encounter (Signed)
30 day supply of ferrous sulfate and alprazolam called to pharmacy voicemail.  Pt was due for 3 month follow up in April and is past due. Further refills cannot be given until pt is seen in the office. Please call pt to arrange follow up as soon as possible.

## 2014-07-28 NOTE — Telephone Encounter (Signed)
Letter mailed to pt.  

## 2014-08-25 ENCOUNTER — Other Ambulatory Visit: Payer: Self-pay | Admitting: Family

## 2014-08-25 NOTE — Telephone Encounter (Signed)
Last alprazolam Rx 07/23/14.  CSC and UDS on file from 02/2014.  Rx printed and forwarded to Provider for signature.

## 2014-08-25 NOTE — Telephone Encounter (Signed)
Rx faxed to pharmacy  

## 2014-09-01 ENCOUNTER — Telehealth: Payer: Self-pay | Admitting: Family

## 2014-09-01 NOTE — Telephone Encounter (Signed)
pre visit letter mailed 08/29/14

## 2014-09-18 ENCOUNTER — Telehealth: Payer: Self-pay | Admitting: *Deleted

## 2014-09-18 NOTE — Telephone Encounter (Signed)
Unable to reach patient at time of Pre-Visit Call.  Left message for patient to return call when available.    

## 2014-09-19 ENCOUNTER — Other Ambulatory Visit: Payer: Self-pay | Admitting: Family

## 2014-09-19 ENCOUNTER — Encounter: Payer: Self-pay | Admitting: Family

## 2014-09-19 ENCOUNTER — Telehealth: Payer: Self-pay | Admitting: *Deleted

## 2014-09-19 ENCOUNTER — Ambulatory Visit (INDEPENDENT_AMBULATORY_CARE_PROVIDER_SITE_OTHER): Payer: BLUE CROSS/BLUE SHIELD | Admitting: Family

## 2014-09-19 VITALS — BP 110/80 | HR 84 | Temp 98.3°F | Resp 16 | Ht 64.5 in | Wt 240.6 lb

## 2014-09-19 DIAGNOSIS — M810 Age-related osteoporosis without current pathological fracture: Secondary | ICD-10-CM

## 2014-09-19 DIAGNOSIS — Z0000 Encounter for general adult medical examination without abnormal findings: Secondary | ICD-10-CM

## 2014-09-19 DIAGNOSIS — E559 Vitamin D deficiency, unspecified: Secondary | ICD-10-CM

## 2014-09-19 LAB — URINALYSIS, ROUTINE W REFLEX MICROSCOPIC
Bilirubin Urine: NEGATIVE
HGB URINE DIPSTICK: NEGATIVE
Ketones, ur: NEGATIVE
Leukocytes, UA: NEGATIVE
Nitrite: NEGATIVE
PH: 6 (ref 5.0–8.0)
RBC / HPF: NONE SEEN (ref 0–?)
Specific Gravity, Urine: <=1.005 — AB (ref 1.000–1.030)
TOTAL PROTEIN, URINE-UPE24: NEGATIVE
URINE GLUCOSE: NEGATIVE
Urobilinogen, UA: 0.2 (ref 0.0–1.0)
WBC UA: NONE SEEN (ref 0–?)

## 2014-09-19 LAB — LIPID PANEL
Cholesterol: 145 mg/dL (ref 0–200)
HDL: 46 mg/dL (ref >39.00)
LDL Cholesterol: 86 mg/dL (ref 0–99)
NONHDL: 98.5
Total CHOL/HDL Ratio: 3
Triglycerides: 64 mg/dL (ref 0.0–149.0)
VLDL: 12.8 mg/dL (ref 0.0–40.0)

## 2014-09-19 LAB — CBC WITH DIFFERENTIAL/PLATELET
Basophils Absolute: 0 10*3/uL (ref 0.0–0.1)
Basophils Relative: 0.6 % (ref 0.0–3.0)
Eosinophils Absolute: 0.2 10*3/uL (ref 0.0–0.7)
Eosinophils Relative: 2.3 % (ref 0.0–5.0)
HCT: 37.3 % (ref 36.0–46.0)
Hemoglobin: 12.3 g/dL (ref 12.0–15.0)
LYMPHS PCT: 28.9 % (ref 12.0–46.0)
Lymphs Abs: 2.1 10*3/uL (ref 0.7–4.0)
MCHC: 33 g/dL (ref 30.0–36.0)
MCV: 88.1 fl (ref 78.0–100.0)
Monocytes Absolute: 0.4 10*3/uL (ref 0.1–1.0)
Monocytes Relative: 5.5 % (ref 3.0–12.0)
Neutro Abs: 4.5 10*3/uL (ref 1.4–7.7)
Neutrophils Relative %: 62.7 % (ref 43.0–77.0)
Platelets: 283 10*3/uL (ref 150.0–400.0)
RBC: 4.24 Mil/uL (ref 3.87–5.11)
RDW: 13.8 % (ref 11.5–15.5)
WBC: 7.2 10*3/uL (ref 4.0–10.5)

## 2014-09-19 LAB — VITAMIN D 25 HYDROXY (VIT D DEFICIENCY, FRACTURES): VITD: 26.96 ng/mL — ABNORMAL LOW (ref 30.00–100.00)

## 2014-09-19 LAB — IRON: IRON: 53 ug/dL (ref 42–145)

## 2014-09-19 LAB — BASIC METABOLIC PANEL
BUN: 13 mg/dL (ref 6–23)
CHLORIDE: 107 meq/L (ref 96–112)
CO2: 27 mEq/L (ref 19–32)
Calcium: 8.6 mg/dL (ref 8.4–10.5)
Creatinine, Ser: 0.54 mg/dL (ref 0.40–1.20)
GFR: 123.07 mL/min (ref >60.00)
Glucose, Bld: 95 mg/dL (ref 70–99)
Potassium: 3.8 mEq/L (ref 3.5–5.1)
SODIUM: 141 meq/L (ref 135–145)

## 2014-09-19 LAB — HEPATIC FUNCTION PANEL
ALT: 17 U/L (ref 0–35)
AST: 16 U/L (ref 0–37)
Albumin: 3.9 g/dL (ref 3.5–5.2)
Alkaline Phosphatase: 100 U/L (ref 39–117)
Bilirubin, Direct: 0.1 mg/dL (ref 0.0–0.3)
Total Bilirubin: 0.3 mg/dL (ref 0.2–1.2)
Total Protein: 6.7 g/dL (ref 6.0–8.3)

## 2014-09-19 LAB — VITAMIN B12: VITAMIN B 12: 942 pg/mL — AB (ref 211–911)

## 2014-09-19 LAB — TSH: TSH: 0.77 u[IU]/mL (ref 0.35–4.50)

## 2014-09-19 MED ORDER — PAROXETINE HCL 30 MG PO TABS: 30.0000 mg | ORAL_TABLET | Freq: Every morning | ORAL | Status: DC

## 2014-09-19 MED ORDER — VITAMIN D (ERGOCALCIFEROL) 1.25 MG (50000 UNIT) PO CAPS: 50000.0000 [IU] | ORAL_CAPSULE | ORAL | Status: DC

## 2014-09-19 MED ORDER — ALPRAZOLAM 0.5 MG PO TABS
0.5000 mg | ORAL_TABLET | Freq: Two times a day (BID) | ORAL | Status: DC
Start: 2014-09-19 — End: 2014-10-24

## 2014-09-19 NOTE — Progress Notes (Signed)
Subjective:    Patient ID: [name] [name], female    DOB: Aug 26, 1955, 59 y.o.   MRN: [id number]  HPI  [name] [name] is a 59 yr old female who presents today for cpx.  Patient presents today for complete physical.  Immunizations: tetanus- up to date Diet:  2 weeks ago she began to cut back on carbs, more protein, has lost 5 lbs Exercise: limited due to joint pain Colonoscopy: 2008- due 2018 Dexa: last bone density was 2013- did 5 years of fosamax Pap Smear: 06/18/13 Mammogram:6/16- negative    Review of Systems  Constitutional: Negative for unexpected weight change.  HENT: Positive for rhinorrhea.   Respiratory: Negative for cough.   Cardiovascular:       R ankle swelling  Gastrointestinal: Negative for diarrhea and constipation.  Genitourinary:       Sees Dr. Diona Fanti- re: bladder issues- reports stable  Musculoskeletal: Positive for arthralgias.  Skin: Positive for rash.  Neurological: Negative for headaches.  Hematological: Negative for adenopathy.  Psychiatric/Behavioral:       Denies current issues with anxiety or depression       Past Medical History  Diagnosis Date  . GERD (gastroesophageal reflux disease)   . Allergy   . Osteoporosis   . Obesity     status post bariatric procedure in [facility] 2005  . Elevated alkaline phosphatase level     negative workup (presumed secondary to fatty liver)  . Thyroid nodule 7/10    `  . Interstitial cystitis     Dr Vernie Shanks  . 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 Thyroid nodule   . Anxiety   . Varicose vein of leg   . Neuromuscular disorder     rt arm carpal tunnel syndrome  . Frequency-urgency syndrome   . Arthritis   . Anemia     takes iron supplement    History   Social History  . Marital Status: Divorced    Spouse Name: N/A  . Number of Children: 1  . Years of Education: N/A   Occupational History  . MANAGER    Social History Main Topics  . Smoking status: Never Smoker   . Smokeless tobacco:  Never Used  . Alcohol Use: 0.6 oz/week    1 Glasses of wine per week     Comment: RARE  . Drug Use: No  . Sexual Activity: Yes   Other Topics Concern  . Not on file   Social History Narrative    Past Surgical History  Procedure Laterality Date  . Joint replacement  2008    left total knee  . Stomach surgery  2007    "tummy tuck"  . Gastric bypass  2005  . Eye surgery  1997    bilateral cataract extraction  . Knee surgery  1997    left knee  . Refractive surgery  2006    lasik  . Nasal sinus surgery  8242    Dr Erik Obey  . Cryotherapy  1980  . Colposcopy  1980  . Shoulder surgery  2 2012    RIGHT   . Foot surgery      bilateral bunionettes  . Cysto with hydrodistension  12/12/2011    Procedure: CYSTOSCOPY/HYDRODISTENSION;  Surgeon: Franchot Gallo, MD;  Location: [facility];  Service: Urology;  Laterality: N/A;  30 MIN   . Bladder surgery  02/21/12 and 02/28/12    Dr Diona Fanti  . Varicose vein surgery Right 02/2012  leg  . Anterior cervical decomp/discectomy fusion N/A 06/21/2012    Procedure: ANTERIOR CERVICAL DECOMPRESSION/DISCECTOMY FUSION C-4 - C5 (SPACER/DePUY CERVICAL PLATE ONLY) 1 LEVEL;  Surgeon: Melina Schools, MD;  Location: [facility];  Service: Orthopedics;  Laterality: N/A;  . Foot surgery Right 01/07/14    Pt states surgery was due to arthritis. "Has pins in place now"    Family History  Problem Relation Age of Onset  . Diabetes Paternal Grandfather   . Hyperlipidemia Mother   . Hypertension Mother   . Hypertension Father   . Diabetes Father   . Prostate cancer Father   . Heart disease Maternal Grandmother   . Hypertension Maternal Grandmother   . Heart disease Maternal Grandfather   . Hypertension Maternal Grandfather   . Stroke Maternal Grandfather   . Stroke Paternal Grandfather     Allergies  Allergen Reactions  . Codeine Itching    REACTION: hives  . Cortisone     REACTION: palpitations  . Ibuprofen     REACTION: Post  gastric bypass-->can not take due to surgery.  No allergy.  . Nitrofurantoin     REACTION: fever  . Prednisone     REACTION: palpitations    Current Outpatient Prescriptions on File Prior to Visit  Medication Sig Dispense Refill  . ALPRAZolam (XANAX) 0.5 MG tablet TAKE 1 TABLET BY MOUTH TWICE DAILY 60 tablet 0  . Azelastine HCl (ASTEPRO) 0.15 % SOLN 1 spray by Each Nare route 2 (two) times daily.      . Bepotastine Besilate (BEPREVE) 1.5 % SOLN Apply 1 drop to eye 2 (two) times daily as needed (allergies).     Marland Kitchen BIOTIN PO Take 1 tablet by mouth daily.    . calcium elemental as carbonate (TUMS ULTRA 1000) 400 MG tablet Chew 1,000 mg by mouth 2 (two) times daily.      . Cholecalciferol (VITAMIN D) 2000 UNITS CAPS Take 2,000 Units by mouth 2 (two) times daily.    . ferrous sulfate 325 (65 FE) MG tablet TAKE 1 TABLET BY MOUTH TWICE DAILY. 60 tablet 5  . HYDROcodone-acetaminophen (NORCO) 10-325 MG per tablet Take 1 tablet by mouth every 6 (six) hours as needed for pain. 60 tablet 0  . MAGNESIUM PO Take 1 tablet by mouth daily.    . mometasone (NASONEX) 50 MCG/ACT nasal spray Place 1 spray into the nose 2 (two) times daily.     . Multiple Vitamin (MULTIVITAMIN) tablet Take 1 tablet by mouth 2 (two) times daily.      Marland Kitchen MYRBETRIQ 50 MG TB24 Take 50 mg by mouth daily.    . Omega-3 Fatty Acids (FISH OIL) 1000 MG CAPS Take 1 capsule by mouth 2 (two) times daily.      Marland Kitchen omeprazole (PRILOSEC) 40 MG capsule Take 40 mg by mouth 2 (two) times daily.      Marland Kitchen PARoxetine (PAXIL) 30 MG tablet TAKE 1 TABLET BY MOUTH EVERY MORNING 30 tablet 0  . valACYclovir (VALTREX) 500 MG tablet TAKE 1 TABLET BY MOUTH EVERY DAY. NEEDS APPT 30 tablet 0  . VITAMIN A PO Take 1 tablet by mouth daily.      No current facility-administered medications on file prior to visit.    BP 110/80 mmHg  Pulse 84  Temp(Src) 98.3 F (36.8 C) (Oral)  Resp 16  Ht 5' 4.5" (1.638 m)  Wt 240 lb 9.6 oz (109.135 kg)  BMI 40.68 kg/m2  SpO2  98%    Objective:  Physical Exam Physical Exam  Constitutional: She is oriented to person, place, and time. She appears well-developed and well-nourished. No distress.  HENT:  Head: Normocephalic and atraumatic.  Right Ear: Tympanic membrane and ear canal normal.  Left Ear: Tympanic membrane and ear canal normal.  Mouth/Throat: Oropharynx is clear and moist.  Eyes: Pupils are equal, round, and reactive to light. No scleral icterus.  Neck: Normal range of motion. No thyromegaly present.  Cardiovascular: Normal rate and regular rhythm.   No murmur heard. Pulmonary/Chest: Effort normal and breath sounds normal. No respiratory distress. He has no wheezes. She has no rales. She exhibits no tenderness.  Abdominal: Soft. Bowel sounds are normal. He exhibits no distension and no mass. There is no tenderness. There is no rebound and no guarding.  Musculoskeletal: She exhibits no edema.  Lymphadenopathy:    She has no cervical adenopathy.  Neurological: She is alert and oriented to person, place, and time.  She exhibits normal muscle tone. Coordination normal.  Skin: Skin is warm and dry.  Psychiatric: She has a normal mood and affect. Her behavior is normal. Judgment and thought content normal.  Breasts: Examined lying Right: Without masses, retractions, discharge or axillary adenopathy.  Left: Without masses, retractions, discharge or axillary adenopathy.          Assessment & Plan:          Assessment & Plan:  Preventative Health Care-  Discussed healthy diet, exercise weight loss.  Immunizations reviewed and up to date. Obtain routine lab work.   EKG tracing is personally reviewed.  EKG notes  RBBB. Compared to prior EKG- unchanged.

## 2014-09-19 NOTE — Telephone Encounter (Signed)
Pt unable to do 9/16 so appt has been r/s for 10/30/14 at 7:30am.

## 2014-09-19 NOTE — Progress Notes (Signed)
Pre visit review using our clinic review tool, if applicable. No additional management support is needed unless otherwise documented below in the visit note. 

## 2014-09-19 NOTE — Telephone Encounter (Signed)
Notified pt and she voices understanding. Rx sent to pharmacy, lab appt scheduled for 10/31/14 and future order entered. Pt requests that I call her back at work # in 101min to verify lab appt.

## 2014-09-19 NOTE — Patient Instructions (Addendum)
Please complete lab work prior to leaving. Continue your weight loss efforts and exercise as able.  You will be contacted about your bone density.

## 2014-09-19 NOTE — Telephone Encounter (Signed)
-----   Message from [name], NP sent at 09/19/2014  1:09 PM EDT ----- Vit D low.  D/c otc vit D supplement.  Advise patient to begin vit D 50000 units once weekly for 12 weeks (rx sent), then repeat vit D level (dx Vit D deficiency).   b12 and iron look good.  Blood count, urine, thyroid, blood count, sugar, kidney function, cholesterol and thyroid look good.

## 2014-09-24 ENCOUNTER — Other Ambulatory Visit (HOSPITAL_BASED_OUTPATIENT_CLINIC_OR_DEPARTMENT_OTHER): Payer: BLUE CROSS/BLUE SHIELD

## 2014-10-24 ENCOUNTER — Telehealth: Payer: Self-pay | Admitting: *Deleted

## 2014-10-24 MED ORDER — ALPRAZOLAM 0.5 MG PO TABS: 0.5000 mg | ORAL_TABLET | Freq: Two times a day (BID) | ORAL | Status: DC

## 2014-10-24 NOTE — Telephone Encounter (Signed)
Rx placed at front desk for pick up.  

## 2014-10-24 NOTE — Telephone Encounter (Signed)
Received fax from [facility] requesting alprazolam refill. Last Rx 09/23/14, #60.  Pt is due for UDS and has been made aware that we will place Rx at front desk for pick up and she can provide UDS at that time. Pt states she already has a lab appt scheduled for 10/30/14 and will pick up Rx and complete UDS at that visit. Pt states she has enough on hand to get her through until that time. Rx printed and forwarded to PCP for signature.

## 2014-10-30 ENCOUNTER — Other Ambulatory Visit: Payer: BLUE CROSS/BLUE SHIELD

## 2014-10-30 ENCOUNTER — Telehealth: Payer: Self-pay | Admitting: Family

## 2014-10-30 ENCOUNTER — Other Ambulatory Visit (INDEPENDENT_AMBULATORY_CARE_PROVIDER_SITE_OTHER): Payer: BLUE CROSS/BLUE SHIELD

## 2014-10-30 DIAGNOSIS — E559 Vitamin D deficiency, unspecified: Secondary | ICD-10-CM

## 2014-10-30 LAB — VITAMIN D 25 HYDROXY (VIT D DEFICIENCY, FRACTURES): VITD: 28.44 ng/mL — ABNORMAL LOW (ref 30.00–100.00)

## 2014-10-30 NOTE — Telephone Encounter (Signed)
Vit D still low but it has not been 12 weeks yet. Advise pt to continue weekly vit D repeat in 8 weeks.

## 2014-10-31 NOTE — Telephone Encounter (Signed)
Left detailed message on cell# and to call and schedule lab appt in 8 weeks.  Future lab order entered.

## 2014-11-04 ENCOUNTER — Ambulatory Visit (HOSPITAL_BASED_OUTPATIENT_CLINIC_OR_DEPARTMENT_OTHER)
Admission: RE | Admit: 2014-11-04 | Discharge: 2014-11-04 | Disposition: A | Discharge status: Home / Self Care | Payer: BLUE CROSS/BLUE SHIELD | Source: Ambulatory Visit | Attending: Family | Admitting: Family

## 2014-11-04 DIAGNOSIS — M810 Age-related osteoporosis without current pathological fracture: Secondary | ICD-10-CM | POA: Diagnosis present

## 2014-11-04 DIAGNOSIS — M8580 Other specified disorders of bone density and structure, unspecified site: Secondary | ICD-10-CM | POA: Insufficient documentation

## 2014-11-05 ENCOUNTER — Other Ambulatory Visit: Payer: Self-pay | Admitting: Family

## 2014-11-05 ENCOUNTER — Encounter: Payer: Self-pay | Admitting: Family

## 2014-11-06 NOTE — Telephone Encounter (Signed)
Last filled: 10/24/14 Amt: 60, 0 Last OV: 09/19/14  Please advise.

## 2014-11-06 NOTE — Telephone Encounter (Signed)
Cannot refill before 30 days.

## 2014-11-07 NOTE — Telephone Encounter (Signed)
Pt notified and made aware.  She stated understanding and said "that's fine."  No additional needs at this time.

## 2014-11-07 NOTE — Telephone Encounter (Signed)
Caller name: Morgen Linebaugh   Relationship to patient: Self   Can be [phone]  Pharmacy: [id number] - [place], [place] - [address] AT [place]  Reason for call: Pt is requesting a refill on his ALPRAZolam Rx. She says that she will be out after this weekend.

## 2014-11-07 NOTE — Telephone Encounter (Signed)
Left a message for call back.  

## 2014-11-18 NOTE — Telephone Encounter (Addendum)
Pt had never picked up Rx from 10/24/14 as it was still at the front desk. Request really not early. Pt gave UDS on 10/30/14 and was out of medication at that time. Pt just picked up Rx from 10/24/14 on 11/17/14. Copy of UDS was faxed to [place] and forwarded to PCP for review.

## 2014-11-20 ENCOUNTER — Encounter: Payer: Self-pay | Admitting: Medical

## 2014-11-20 ENCOUNTER — Ambulatory Visit (INDEPENDENT_AMBULATORY_CARE_PROVIDER_SITE_OTHER): Payer: BLUE CROSS/BLUE SHIELD | Admitting: Medical

## 2014-11-20 VITALS — BP 118/78 | HR 65 | Temp 97.0°F | Ht 64.5 in | Wt 245.0 lb

## 2014-11-20 DIAGNOSIS — J0100 Acute maxillary sinusitis, unspecified: Secondary | ICD-10-CM | POA: Diagnosis not present

## 2014-11-20 DIAGNOSIS — J3089 Other allergic rhinitis: Secondary | ICD-10-CM

## 2014-11-20 MED ORDER — PREDNISONE 20 MG PO TABS: ORAL_TABLET | ORAL | Status: DC

## 2014-11-20 MED ORDER — AMOXICILLIN-POT CLAVULANATE 875-125 MG PO TABS: 1.0000 | ORAL_TABLET | Freq: Two times a day (BID) | ORAL | Status: DC

## 2014-11-20 NOTE — Patient Instructions (Signed)
You appear to have a sinus infection with underlying allergic rhinitis. I am prescribing antibiotic augmentin for the infection. To help with the nasal congestion continue your nasonex.   For your nasal congestion you could use low dose prednisone as well. I am making that available at your request.

## 2014-11-20 NOTE — Progress Notes (Signed)
Pre visit review using our clinic review tool, if applicable. No additional management support is needed unless otherwise documented below in the visit note. 

## 2014-11-20 NOTE — Progress Notes (Signed)
Subjective:    Patient ID: [name] [name], female    DOB: 12/30/1955, 59 y.o.   MRN: [id number]  HPI   Pt in for recent sinus problems. She states some drainage and nasal congestion for 2 months. Pt states her allergist gave her prednisone about 4-6 wks ago. She got better briefly over past month. She was in [place] and outside this past weekend. Then last 3 days sinus pressure came on quickly with worsening nasal congestion. Her eyes are itching as well.   She is prone to sinus infection following allergies.  Pt is on astelin, nasonex, allegra and rx eye drop.  Pt states typically when get sinus infection she needs 14 day course augmentin.     Review of Systems  Constitutional: Negative for chills and fatigue.  HENT: Positive for congestion, postnasal drip, sinus pressure and sneezing.   Respiratory: Positive for cough. Negative for chest tightness and wheezing.   Cardiovascular: Negative for chest pain and palpitations.  Musculoskeletal: Negative for back pain.  Neurological: Negative for dizziness, syncope, weakness and headaches.  Hematological: Negative for adenopathy. Does not bruise/bleed easily.  Psychiatric/Behavioral: Negative for behavioral problems and decreased concentration.   Past Medical History  Diagnosis Date  . GERD (gastroesophageal reflux disease)   . Allergy   . Osteoporosis   . Obesity     status post bariatric procedure in [facility] 2005  . Elevated alkaline phosphatase level     negative workup (presumed secondary to fatty liver)  . Thyroid nodule 7/10    `  . Interstitial cystitis     Dr Vernie Shanks  . 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 Thyroid nodule   . Anxiety   . Varicose vein of leg   . Neuromuscular disorder (HCC)     rt arm carpal tunnel syndrome  . Frequency-urgency syndrome   . Arthritis   . Anemia     takes iron supplement    Social History   Social History  . Marital Status: Divorced    Spouse Name: N/A  . Number of  Children: 1  . Years of Education: N/A   Occupational History  . MANAGER    Social History Main Topics  . Smoking status: Never Smoker   . Smokeless tobacco: Never Used  . Alcohol Use: 0.6 oz/week    1 Glasses of wine per week     Comment: RARE  . Drug Use: No  . Sexual Activity: Yes   Other Topics Concern  . Not on file   Social History Narrative    Past Surgical History  Procedure Laterality Date  . Joint replacement  2008    left total knee  . Stomach surgery  2007    "tummy tuck"  . Gastric bypass  2005  . Eye surgery  1997    bilateral cataract extraction  . Knee surgery  1997    left knee  . Refractive surgery  2006    lasik  . Nasal sinus surgery  1448    Dr Erik Obey  . Cryotherapy  1980  . Colposcopy  1980  . Shoulder surgery  2 2012    RIGHT   . Foot surgery      bilateral bunionettes  . Cysto with hydrodistension  12/12/2011    Procedure: CYSTOSCOPY/HYDRODISTENSION;  Surgeon: Franchot Gallo, MD;  Location: [facility];  Service: Urology;  Laterality: N/A;  30 MIN   . Bladder surgery  02/21/12 and 02/28/12  Dr Diona Fanti  . Varicose vein surgery Right 02/2012    leg  . Anterior cervical decomp/discectomy fusion N/A 06/21/2012    Procedure: ANTERIOR CERVICAL DECOMPRESSION/DISCECTOMY FUSION C-4 - C5 (SPACER/DePUY CERVICAL PLATE ONLY) 1 LEVEL;  Surgeon: Melina Schools, MD;  Location: [place];  Service: Orthopedics;  Laterality: N/A;  . Foot surgery Right 01/07/14    Pt states surgery was due to arthritis. "Has pins in place now"    Family History  Problem Relation Age of Onset  . Diabetes Paternal Grandfather   . Hyperlipidemia Mother   . Hypertension Mother   . Hypertension Father   . Diabetes Father   . Prostate cancer Father   . Heart disease Maternal Grandmother   . Hypertension Maternal Grandmother   . Heart disease Maternal Grandfather   . Hypertension Maternal Grandfather   . Stroke Maternal Grandfather   . Stroke Paternal  Grandfather     Allergies  Allergen Reactions  . Codeine Itching    REACTION: hives  . Cortisone     REACTION: palpitations  . 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  No allergy.  . Nitrofurantoin     REACTION: fever  . Prednisone     REACTION: palpitations    Current Outpatient Prescriptions on File Prior to Visit  Medication Sig Dispense Refill  . ALPRAZolam (XANAX) 0.5 MG tablet Take 1 tablet (0.5 mg total) by mouth 2 (two) times daily. 60 tablet 0  . Azelastine HCl (ASTEPRO) 0.15 % SOLN 1 spray by Each Nare route 2 (two) times daily.      . Bepotastine Besilate (BEPREVE) 1.5 % SOLN Apply 1 drop to eye 2 (two) times daily as needed (allergies).     Marland Kitchen BIOTIN PO Take 1 tablet by mouth daily.    . calcium elemental as carbonate (TUMS ULTRA 1000) 400 MG tablet Chew 1,000 mg by mouth 2 (two) times daily.      . ferrous sulfate 325 (65 FE) MG tablet TAKE 1 TABLET BY MOUTH TWICE DAILY. 60 tablet 5  . fexofenadine (ALLEGRA) 180 MG tablet Take 180 mg by mouth daily as needed for allergies or rhinitis.    Marland Kitchen HYDROcodone-acetaminophen (NORCO) 10-325 MG per tablet Take 1 tablet by mouth every 6 (six) hours as needed for pain. 60 tablet 0  . MAGNESIUM PO Take 1 tablet by mouth daily.    . methocarbamol (ROBAXIN) 500 MG tablet Take 500 mg by mouth as needed. Two times weekly as needed  1  . mometasone (NASONEX) 50 MCG/ACT nasal spray Place 1 spray into the nose 2 (two) times daily.     . Multiple Vitamin (MULTIVITAMIN) tablet Take 1 tablet by mouth 2 (two) times daily.      Marland Kitchen MYRBETRIQ 50 MG TB24 Take 50 mg by mouth daily.    . Omega-3 Fatty Acids (FISH OIL) 1000 MG CAPS Take 1 capsule by mouth 2 (two) times daily.      Marland Kitchen omeprazole (PRILOSEC) 40 MG capsule Take 40 mg by mouth 2 (two) times daily.      Marland Kitchen PARoxetine (PAXIL) 30 MG tablet Take 1 tablet (30 mg total) by mouth every morning. 30 tablet 5  . phenazopyridine (PYRIDIUM) 200 MG tablet Take 200 mg by  mouth daily as needed.  11  . valACYclovir (VALTREX) 500 MG tablet TAKE 1 TABLET BY MOUTH EVERY DAY. NEEDS APPT 30 tablet 0  . VITAMIN A PO Take 1 tablet by mouth daily.     Marland Kitchen  Vitamin D, Ergocalciferol, (DRISDOL) 50000 UNITS CAPS capsule Take 1 capsule (50,000 Units total) by mouth every 7 (seven) days. 12 capsule 0   No current facility-administered medications on file prior to visit.    BP 118/78 mmHg  Pulse 65  Temp(Src) 97 F (36.1 C) (Oral)  Ht 5' 4.5" (1.638 m)  Wt 245 lb (111.131 kg)  BMI 41.42 kg/m2  SpO2 98%       Objective:   Physical Exam  Mental Status - Alert. General Appearance - Well groomed. Not in acute distress.  Skin Rashes- No Rashes.  HEENT Head- Normal. Ear Auditory Canal - Left- Normal. Right - Normal.Tympanic Membrane- Left- Normal. Right- Normal. Eye Sclera/Conjunctiva- Left- Normal. Right- Normal. Nose & Sinuses Nasal Mucosa- Left- Boggy and Congested. Right- Boggy and Congested.Bilateral maxillary sinus pressure but no frontal sinus pressure. Mouth & Throat Lips: Upper Lip- Normal: no dryness, cracking, pallor, cyanosis, or vesicular eruption. Lower Lip-Normal: no dryness, cracking, pallor, cyanosis or vesicular eruption. Buccal Mucosa- Bilateral- No Aphthous ulcers. Oropharynx- No Discharge or Erythema. Tonsils: Characteristics- Bilateral- No Erythema or Congestion. Size/Enlargement- Bilateral- No enlargement. Discharge- bilateral-None.  Neck Neck- Supple. No Masses.   Chest and Lung Exam Auscultation: Breath Sounds:-Clear even and unlabored.  Cardiovascular Auscultation:Rythm- Regular, rate and rhythm. Murmurs & Other Heart Sounds:Ausculatation of the heart reveal- No Murmurs.  Lymphatic Head & Neck General Head & Neck Lymphatics: Bilateral: Description- No Localized lymphadenopathy.      Assessment & Plan:  You appear to have a sinus infection with underlying allergic rhinitis. I am prescribing antibiotic augmentin for the  infection. To help with the nasal congestion continue your nasonex.   For your nasal congestion you could use low dose prednisone as well. I am making that available at your request.   Note regarding both rx today. Pt did not have any reaction to either rx in January when I wrote the same medications.

## 2014-11-25 ENCOUNTER — Other Ambulatory Visit: Payer: Self-pay | Admitting: Orthopedic Surgery

## 2014-11-25 DIAGNOSIS — M25474 Effusion, right foot: Secondary | ICD-10-CM

## 2014-11-26 ENCOUNTER — Telehealth: Payer: Self-pay | Admitting: *Deleted

## 2014-11-26 NOTE — Telephone Encounter (Signed)
Received fax from [facility] for Alprazolam 0.5mg  refill.  Spoke with pharmacy and they filled Rx for pt on 11/19/14, #60. Spoke with pt and she states she requested some refills with pharmacy today and must have requested this alprazolam in error. Sent fax to pharmacy to d/c request.

## 2014-11-28 ENCOUNTER — Telehealth: Payer: Self-pay | Admitting: *Deleted

## 2014-11-28 MED ORDER — VITAMIN D (ERGOCALCIFEROL) 1.25 MG (50000 UNIT) PO CAPS: 50000.0000 [IU] | ORAL_CAPSULE | ORAL | Status: DC

## 2014-11-28 NOTE — Telephone Encounter (Signed)
Received fax request from [facility] for Vitamin D 50,000 units once a week.  Refill sent. See 10/30/14 phone note.

## 2014-12-01 ENCOUNTER — Other Ambulatory Visit: Payer: BLUE CROSS/BLUE SHIELD

## 2014-12-03 ENCOUNTER — Other Ambulatory Visit: Payer: Self-pay | Admitting: Allergy and Immunology

## 2014-12-05 ENCOUNTER — Inpatient Hospital Stay
Admission: RE | Admit: 2014-12-05 | Discharge: 2014-12-05 | Disposition: A | Discharge status: Home / Self Care | Payer: BLUE CROSS/BLUE SHIELD | Source: Ambulatory Visit | Attending: Orthopedic Surgery | Admitting: Orthopedic Surgery

## 2015-01-02 ENCOUNTER — Telehealth: Payer: Self-pay | Admitting: *Deleted

## 2015-01-02 MED ORDER — ALPRAZOLAM 0.5 MG PO TABS: 0.5000 mg | ORAL_TABLET | Freq: Two times a day (BID) | ORAL | Status: DC

## 2015-01-02 NOTE — Telephone Encounter (Signed)
Received fax from [facility]  for Alprazolam 0.5mg  twice a day.  Last Rx 11/19/14.  Rx called to [place], #60 x no refills.

## 2015-01-22 ENCOUNTER — Other Ambulatory Visit: Payer: Self-pay | Admitting: Orthopedic Surgery

## 2015-01-22 ENCOUNTER — Telehealth: Payer: Self-pay | Admitting: Family

## 2015-01-22 ENCOUNTER — Ambulatory Visit
Admission: RE | Admit: 2015-01-22 | Discharge: 2015-01-22 | Disposition: A | Discharge status: Home / Self Care | Payer: BLUE CROSS/BLUE SHIELD | Source: Ambulatory Visit | Attending: Orthopedic Surgery | Admitting: Orthopedic Surgery

## 2015-01-22 DIAGNOSIS — M25474 Effusion, right foot: Secondary | ICD-10-CM

## 2015-01-22 NOTE — Telephone Encounter (Signed)
Relation to [id number] Call back [phone] Pharmacy: [id number] - [place], [place] - [address] AT [place] [phone] (Phone) [phone] (Fax)         Reason for call:  Patient requesting a 60 day supply for the following medication  PARoxetine (PAXIL) 30 MG tablet  ALPRAZolam (XANAX) 0.5 MG tablet  omeprazole (PRILOSEC) 40 MG capsule (patient states she would like NP to take over medication because she sees her allergist once a year) mometasone (NASONEX) 50 MCG/ACT nasal spray (patient states she would like NP to take over medication because she sees her allergist once a year) Azelastine HCl (ASTEPRO) 0.15 % SOLN

## 2015-01-23 MED ORDER — OMEPRAZOLE 40 MG PO CPDR: 40.0000 mg | DELAYED_RELEASE_CAPSULE | Freq: Two times a day (BID) | ORAL | Status: DC

## 2015-01-23 MED ORDER — AZELASTINE HCL 0.15 % NA SOLN: 1.0000 | Freq: Two times a day (BID) | NASAL | Status: DC

## 2015-01-23 MED ORDER — PAROXETINE HCL 30 MG PO TABS: 30.0000 mg | ORAL_TABLET | Freq: Every morning | ORAL | Status: DC

## 2015-01-23 MED ORDER — ALPRAZOLAM 0.5 MG PO TABS: 0.5000 mg | ORAL_TABLET | Freq: Two times a day (BID) | ORAL | Status: DC

## 2015-01-23 MED ORDER — MOMETASONE FUROATE 50 MCG/ACT NA SUSP: NASAL | Status: DC

## 2015-01-23 NOTE — Telephone Encounter (Signed)
Alprazolam Rx faxed to pharmacy. Notified pt and she states she was requesting a 90 day supply of below Rxs. Rxs re-sent.

## 2015-01-23 NOTE — Telephone Encounter (Signed)
Can send 60 tabs xanax no refills please.

## 2015-01-23 NOTE — Telephone Encounter (Signed)
Pt next f/u 03/2015, Next UDS due 01/29/15. Last Alprazolam RX 01/02/15.  Please advise below requests?

## 2015-01-23 NOTE — Telephone Encounter (Signed)
Refills sent on medications requested below, alprazolam rx printed and forwarded to PCP for signature.

## 2015-01-27 ENCOUNTER — Telehealth: Payer: Self-pay | Admitting: Family

## 2015-01-27 ENCOUNTER — Other Ambulatory Visit (INDEPENDENT_AMBULATORY_CARE_PROVIDER_SITE_OTHER): Payer: BLUE CROSS/BLUE SHIELD

## 2015-01-27 DIAGNOSIS — E559 Vitamin D deficiency, unspecified: Secondary | ICD-10-CM

## 2015-01-27 LAB — VITAMIN D 25 HYDROXY (VIT D DEFICIENCY, FRACTURES): VITD: 27.14 ng/mL — ABNORMAL LOW (ref 30.00–100.00)

## 2015-01-27 MED ORDER — VITAMIN D (ERGOCALCIFEROL) 1.25 MG (50000 UNIT) PO CAPS: 50000.0000 [IU] | ORAL_CAPSULE | ORAL | Status: DC

## 2015-01-27 NOTE — Telephone Encounter (Signed)
Vit d is low. Increase vit D supplement to 2 x weekly, repeat vit d level in 3 mos, dx vit d def

## 2015-01-28 NOTE — Telephone Encounter (Signed)
Left detailed message on pt's cell# and to call and schedule lab appt. Future order entered.

## 2015-02-06 ENCOUNTER — Telehealth: Payer: Self-pay | Admitting: *Deleted

## 2015-02-06 MED ORDER — ALPRAZOLAM 0.5 MG PO TABS
0.5000 mg | ORAL_TABLET | Freq: Two times a day (BID) | ORAL | Status: DC
Start: 2015-02-06 — End: 2015-03-13

## 2015-02-06 NOTE — Telephone Encounter (Signed)
Spoke with Anderson Malta at [facility]. She states they never received Rx that was faxed to them on 01/23/15.  Verbal given for #60 x no refills.

## 2015-02-20 ENCOUNTER — Encounter: Payer: Self-pay | Admitting: Family

## 2015-02-20 ENCOUNTER — Ambulatory Visit (INDEPENDENT_AMBULATORY_CARE_PROVIDER_SITE_OTHER): Payer: BLUE CROSS/BLUE SHIELD | Admitting: Family

## 2015-02-20 VITALS — BP 117/68 | HR 105 | Temp 98.2°F | Resp 20 | Ht 64.5 in | Wt 243.2 lb

## 2015-02-20 DIAGNOSIS — L03011 Cellulitis of right finger: Secondary | ICD-10-CM | POA: Diagnosis not present

## 2015-02-20 DIAGNOSIS — J0100 Acute maxillary sinusitis, unspecified: Secondary | ICD-10-CM | POA: Diagnosis not present

## 2015-02-20 MED ORDER — AMOXICILLIN-POT CLAVULANATE 875-125 MG PO TABS: 1.0000 | ORAL_TABLET | Freq: Two times a day (BID) | ORAL | Status: DC

## 2015-02-20 NOTE — Progress Notes (Signed)
Subjective:    Patient ID: [name] [name], female    DOB: [birth year], 60 y.o.   MRN: [id number]  HPI  [name] is a 60 yr old female who presents today following a dog bite to the right middle finger 5 days ago. Reports that her pomeranian "nipped" her finger by accident trying to fetch a treat.  She has been treating the area with neosporin, but notes that the area has "become infected."    She also reports + facial pain and wonders if she has a sinus infection. Reports that 1 month ago she develops sinus congestion.  Very little drainage.  Feels some hard discharge yesterday. Reports + facial pressure in the maxillary sinus.  Has some mild right ear pain for a few days.  She denies fever. She continues her allegra, nasonex and astepro.    Review of Systems See HPI  Past Medical History  Diagnosis Date  . GERD (gastroesophageal reflux disease)   . Allergy   . Osteoporosis   . Obesity     status post bariatric procedure in [facility] 2005  . Elevated alkaline phosphatase level     negative workup (presumed secondary to fatty liver)  . Thyroid nodule 7/10    `  . Interstitial cystitis     Dr [name] [name]  . 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 Thyroid nodule   . Anxiety   . Varicose vein of leg   . Neuromuscular disorder (HCC)     rt arm carpal tunnel syndrome  . Frequency-urgency syndrome   . Arthritis   . Anemia     takes iron supplement    Social History   Social History  . Marital Status: Divorced    Spouse Name: N/A  . Number of Children: 1  . Years of Education: N/A   Occupational History  . MANAGER    Social History Main Topics  . Smoking status: Never Smoker   . Smokeless tobacco: Never Used  . Alcohol Use: 0.6 oz/week    1 Glasses of wine per week     Comment: RARE  . Drug Use: No  . Sexual Activity: Yes   Other Topics Concern  . Not on file   Social History Narrative    Past Surgical History  Procedure Laterality Date  . Joint replacement   2008    left total knee  . Stomach surgery  2007    "tummy tuck"  . Gastric bypass  2005  . Eye surgery  1997    bilateral cataract extraction  . Knee surgery  1997    left knee  . Refractive surgery  2006    lasik  . Nasal sinus surgery  [id number]    Dr [name] [name]  . Cryotherapy  1980  . Colposcopy  1980  . Shoulder surgery  2 2012    RIGHT   . Foot surgery      bilateral bunionettes  . Cysto with hydrodistension  12/12/2011    Procedure: CYSTOSCOPY/HYDRODISTENSION;  Surgeon: [name] Gallo, MD;  Location: [facility];  Service: Urology;  Laterality: N/A;  30 MIN   . Bladder surgery  02/21/12 and 02/28/12    Dr [name] [name]  . Varicose vein surgery Right 02/2012    leg  . Anterior cervical decomp/discectomy fusion N/A 06/21/2012    Procedure: ANTERIOR CERVICAL DECOMPRESSION/DISCECTOMY FUSION C-4 - C5 (SPACER/DePUY CERVICAL PLATE ONLY) 1 LEVEL;  Surgeon: Melina Schools, MD;  Location: [place];  Service: Orthopedics;  Laterality: N/A;  . Foot surgery Right 01/07/14    Pt states surgery was due to arthritis. "Has pins in place now"    Family History  Problem Relation Age of Onset  . Diabetes Paternal Grandfather   . Hyperlipidemia Mother   . Hypertension Mother   . Hypertension Father   . Diabetes Father   . Prostate cancer Father   . Heart disease Maternal Grandmother   . Hypertension Maternal Grandmother   . Heart disease Maternal Grandfather   . Hypertension Maternal Grandfather   . Stroke Maternal Grandfather   . Stroke Paternal Grandfather     Allergies  Allergen Reactions  . Codeine Itching    REACTION: hives  . Cortisone     REACTION: palpitations  . 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  No allergy.  . Nitrofurantoin     REACTION: fever  . Prednisone     REACTION: palpitations    Current Outpatient Prescriptions on File Prior to Visit  Medication Sig Dispense Refill  . ALPRAZolam (XANAX) 0.5 MG tablet Take 1  tablet (0.5 mg total) by mouth 2 (two) times daily. 60 tablet 0  . Azelastine HCl (ASTEPRO) 0.15 % SOLN Place 1 spray into both nostrils 2 (two) times daily. 90 mL 1  . Bepotastine Besilate (BEPREVE) 1.5 % SOLN Apply 1 drop to eye 2 (two) times daily as needed (allergies).     Marland Kitchen BIOTIN PO Take 1 tablet by mouth daily.    . calcium elemental as carbonate (TUMS ULTRA 1000) 400 MG tablet Chew 1,000 mg by mouth 2 (two) times daily.      . ferrous sulfate 325 (65 FE) MG tablet TAKE 1 TABLET BY MOUTH TWICE DAILY. 60 tablet 5  . fexofenadine (ALLEGRA) 180 MG tablet Take 180 mg by mouth daily as needed for allergies or rhinitis.    Marland Kitchen HYDROcodone-acetaminophen (NORCO) 10-325 MG per tablet Take 1 tablet by mouth every 6 (six) hours as needed for pain. 60 tablet 0  . MAGNESIUM PO Take 1 tablet by mouth daily.    . methocarbamol (ROBAXIN) 500 MG tablet Take 500 mg by mouth as needed. Two times weekly as needed  1  . mometasone (NASONEX) 50 MCG/ACT nasal spray USE 1-2 SPRAYS IN EACH NOSTRIL ONCE DAILY FOR STUFFY NOSE OR DRAINAGE 51 g 1  . Multiple Vitamin (MULTIVITAMIN) tablet Take 1 tablet by mouth 2 (two) times daily.      Marland Kitchen MYRBETRIQ 50 MG TB24 Take 50 mg by mouth daily.    . Omega-3 Fatty Acids (FISH OIL) 1000 MG CAPS Take 1 capsule by mouth 2 (two) times daily.      Marland Kitchen omeprazole (PRILOSEC) 40 MG capsule Take 1 capsule (40 mg total) by mouth 2 (two) times daily. 180 capsule 1  . PARoxetine (PAXIL) 30 MG tablet Take 1 tablet (30 mg total) by mouth every morning. 90 tablet 1  . phenazopyridine (PYRIDIUM) 200 MG tablet Take 200 mg by mouth daily as needed.  11  . predniSONE (DELTASONE) 20 MG tablet 1 tab po tid x 3 days 9 tablet 0  . valACYclovir (VALTREX) 500 MG tablet TAKE 1 TABLET BY MOUTH EVERY DAY. NEEDS APPT 30 tablet 0  . VITAMIN A PO Take 1 tablet by mouth daily.     . Vitamin D, Ergocalciferol, (DRISDOL) 50000 UNITS CAPS capsule Take 1 capsule (50,000 Units total) by mouth 2 (two) times a week. 24  capsule 0  No current facility-administered medications on file prior to visit.    BP 117/68 mmHg  Pulse 105  Temp(Src) 98.2 F (36.8 C) (Oral)  Resp 20  Ht 5' 4.5" (1.638 m)  Wt 243 lb 3.2 oz (110.315 kg)  BMI 41.12 kg/m2  SpO2 93%       Objective:   Physical Exam  Constitutional: She is oriented to person, place, and time. She appears well-developed and well-nourished.  HENT:  Head: Normocephalic and atraumatic.  Right Ear: Tympanic membrane and ear canal normal.  Left Ear: Tympanic membrane and ear canal normal.  Nose: Right sinus exhibits maxillary sinus tenderness. Left sinus exhibits maxillary sinus tenderness.  Mouth/Throat: No oropharyngeal exudate, posterior oropharyngeal edema or posterior oropharyngeal erythema.  Cardiovascular: Normal rate, regular rhythm and normal heart sounds.   No murmur heard. Pulmonary/Chest: Effort normal and breath sounds normal. No respiratory distress. She has no wheezes.  Lymphadenopathy:    She has no cervical adenopathy.  Neurological: She is alert and oriented to person, place, and time.  Skin:  + mild erythema/ right middle finger right lateral nail bed  Psychiatric: She has a normal mood and affect. Her behavior is normal. Judgment and thought content normal.           Assessment & Plan:  Sinusitis- Will rx with augmentin.  Continue allegra, nasal sprays.  Pt requests 14 day course per her ENT's recommendation.    Paronychia/cellulitis- Mild, due to dog bite. Rx with augmentin.

## 2015-02-20 NOTE — Progress Notes (Signed)
Pre visit review using our clinic review tool, if applicable. No additional management support is needed unless otherwise documented below in the visit note. 

## 2015-02-20 NOTE — Patient Instructions (Signed)
Please start augmentin for sinus infection and infection on your finger. Please let us know if symptoms worsen, if you develop fever, or if symptoms are not improved in 2-3 days.

## 2015-03-10 ENCOUNTER — Telehealth: Payer: Self-pay | Admitting: *Deleted

## 2015-03-10 MED ORDER — FERROUS SULFATE 325 (65 FE) MG PO TABS: 325.0000 mg | ORAL_TABLET | Freq: Two times a day (BID) | ORAL | Status: DC

## 2015-03-10 MED ORDER — OMEPRAZOLE 40 MG PO CPDR: 40.0000 mg | DELAYED_RELEASE_CAPSULE | Freq: Two times a day (BID) | ORAL | Status: DC

## 2015-03-10 MED ORDER — VITAMIN D (ERGOCALCIFEROL) 1.25 MG (50000 UNIT) PO CAPS: 50000.0000 [IU] | ORAL_CAPSULE | ORAL | Status: DC

## 2015-03-10 MED ORDER — PAROXETINE HCL 30 MG PO TABS: 30.0000 mg | ORAL_TABLET | Freq: Every morning | ORAL | Status: DC

## 2015-03-10 NOTE — Telephone Encounter (Signed)
Received fax from [facility] requesting 90 day Rxs on "all meds".   Spoke with pt. She states [facility] transferred Rxs but [facility] told her they were for 30 days only. Re-sent Rxs for 90 day supply. Pt states she has stopped all nasal sprays at the direction of her ENT doctor. Meds removed from med list.

## 2015-03-13 ENCOUNTER — Telehealth: Payer: Self-pay | Admitting: *Deleted

## 2015-03-13 MED ORDER — ALPRAZOLAM 0.5 MG PO TABS: 0.5000 mg | ORAL_TABLET | Freq: Two times a day (BID) | ORAL | Status: DC

## 2015-03-13 NOTE — Telephone Encounter (Signed)
Rx faxed to pharmacy  

## 2015-03-13 NOTE — Telephone Encounter (Signed)
Received fax from [facility] for alprazolam. Last Rx 02/06/16.  Last UDS 10/2014. Pt has f/u on 03/2015 and will get UDS at that visit. Rx printed and forwarded to PCP for signature.

## 2015-03-17 ENCOUNTER — Telehealth: Payer: Self-pay | Admitting: General Practice

## 2015-03-17 MED ORDER — OMEPRAZOLE 40 MG PO CPDR: 40.0000 mg | DELAYED_RELEASE_CAPSULE | Freq: Every day | ORAL | Status: DC

## 2015-03-17 NOTE — Telephone Encounter (Signed)
Received a fax from [facility] in regards to patients omeprazole 40mg  capsule. Per pharmacy pt is exceeding her quantity limit f one capsule daily. SIG wasvpreviously written for 2 capsules daily. Please advise if patient needs to be on 2 capsules daily. If not please resend in prescription for 1 capsule daily.

## 2015-03-17 NOTE — Telephone Encounter (Signed)
Pt notified and verbalized understanding.

## 2015-03-17 NOTE — Telephone Encounter (Signed)
Lets decrease to 1 capsule daily.  Pls notify patient.

## 2015-03-25 ENCOUNTER — Ambulatory Visit: Payer: BLUE CROSS/BLUE SHIELD | Admitting: Family

## 2015-03-27 ENCOUNTER — Ambulatory Visit (INDEPENDENT_AMBULATORY_CARE_PROVIDER_SITE_OTHER): Payer: BLUE CROSS/BLUE SHIELD | Admitting: Family

## 2015-03-27 ENCOUNTER — Encounter: Payer: Self-pay | Admitting: Family

## 2015-03-27 VITALS — BP 127/52 | HR 103 | Temp 98.6°F | Resp 18 | Ht 65.0 in | Wt 248.6 lb

## 2015-03-27 DIAGNOSIS — I10 Essential (primary) hypertension: Secondary | ICD-10-CM | POA: Diagnosis not present

## 2015-03-27 DIAGNOSIS — N3090 Cystitis, unspecified without hematuria: Secondary | ICD-10-CM

## 2015-03-27 DIAGNOSIS — F411 Generalized anxiety disorder: Secondary | ICD-10-CM

## 2015-03-27 DIAGNOSIS — R300 Dysuria: Secondary | ICD-10-CM | POA: Diagnosis not present

## 2015-03-27 DIAGNOSIS — E559 Vitamin D deficiency, unspecified: Secondary | ICD-10-CM | POA: Diagnosis not present

## 2015-03-27 LAB — POCT URINALYSIS DIPSTICK
Bilirubin, UA: NEGATIVE
Blood, UA: NEGATIVE
CLARITY UA: NEGATIVE
GLUCOSE UA: NEGATIVE
Ketones, UA: NEGATIVE
LEUKOCYTES UA: NEGATIVE
Nitrite, UA: NEGATIVE
PROTEIN UA: NEGATIVE
SPEC GRAV UA: 1.015
UROBILINOGEN UA: NEGATIVE
pH, UA: 6

## 2015-03-27 LAB — VITAMIN D 25 HYDROXY (VIT D DEFICIENCY, FRACTURES): VITD: 39.71 ng/mL (ref 30.00–100.00)

## 2015-03-27 NOTE — Patient Instructions (Addendum)
Please complete lab work prior to leaving (including UDS). Follow up with Dr. Diona Fanti for your cystitis. In the meantime you can continue ditropan and pyridium as needed.

## 2015-03-27 NOTE — Progress Notes (Signed)
Pre visit review using our clinic review tool, if applicable. No additional management support is needed unless otherwise documented below in the visit note. 

## 2015-03-27 NOTE — Assessment & Plan Note (Addendum)
Stable on current meds. Continue same. Obtain uds.

## 2015-03-27 NOTE — Assessment & Plan Note (Signed)
Stable off of meds.  

## 2015-03-27 NOTE — Progress Notes (Signed)
Subjective:    Patient ID: [name] [name], female    DOB: May 16, 1955, 60 y.o.   MRN: [id number]  HPI  [name] is a 60 yr old female who presents today for follow up.  1) Vit D deficiency- she is maintained on twice weekly vit D.  2) HTN- not currently on antihypertensive meds.  BP Readings from Last 3 Encounters:  03/27/15 127/52  02/20/15 117/68  11/20/14 118/78   3) Anxiety- on paxil and prn xanax. She uses xanax at night most days, rarely needs a second dose.   4) Dysuria- Reports dysuria for several weeks.  She has hx of cystitis which is being treated by Dr. Diona Fanti.    Review of Systems    see HPI  Past Medical History  Diagnosis Date  . GERD (gastroesophageal reflux disease)   . Allergy   . Osteoporosis   . Obesity     status post bariatric procedure in [facility] 2005  . Elevated alkaline phosphatase level     negative workup (presumed secondary to fatty liver)  . Thyroid nodule 7/10    `  . Interstitial cystitis     Dr Vernie Shanks  . 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 Thyroid nodule   . Anxiety   . Varicose vein of leg   . Neuromuscular disorder (HCC)     rt arm carpal tunnel syndrome  . Frequency-urgency syndrome   . Arthritis   . Anemia     takes iron supplement    Social History   Social History  . Marital Status: Divorced    Spouse Name: N/A  . Number of Children: 1  . Years of Education: N/A   Occupational History  . MANAGER    Social History Main Topics  . Smoking status: Never Smoker   . Smokeless tobacco: Never Used  . Alcohol Use: 0.6 oz/week    1 Glasses of wine per week     Comment: RARE  . Drug Use: No  . Sexual Activity: Yes   Other Topics Concern  . Not on file   Social History Narrative    Past Surgical History  Procedure Laterality Date  . Joint replacement  2008    left total knee  . Stomach surgery  2007    "tummy tuck"  . Gastric bypass  2005  . Eye surgery  1997    bilateral cataract extraction  .  Knee surgery  1997    left knee  . Refractive surgery  2006    lasik  . Nasal sinus surgery  123XX123    Dr Erik Obey  . Cryotherapy  1980  . Colposcopy  1980  . Shoulder surgery  2 2012    RIGHT   . Foot surgery      bilateral bunionettes  . Cysto with hydrodistension  12/12/2011    Procedure: CYSTOSCOPY/HYDRODISTENSION;  Surgeon: Franchot Gallo, MD;  Location: [facility];  Service: Urology;  Laterality: N/A;  30 MIN   . Bladder surgery  02/21/12 and 02/28/12    Dr Diona Fanti  . Varicose vein surgery Right 02/2012    leg  . Anterior cervical decomp/discectomy fusion N/A 06/21/2012    Procedure: ANTERIOR CERVICAL DECOMPRESSION/DISCECTOMY FUSION C-4 - C5 (SPACER/DePUY CERVICAL PLATE ONLY) 1 LEVEL;  Surgeon: Melina Schools, MD;  Location: [place];  Service: Orthopedics;  Laterality: N/A;  . Foot surgery Right 01/07/14    Pt states surgery was due to arthritis. "Has pins in place  now"    Family History  Problem Relation Age of Onset  . Diabetes Paternal Grandfather   . Hyperlipidemia Mother   . Hypertension Mother   . Hypertension Father   . Diabetes Father   . Prostate cancer Father   . Heart disease Maternal Grandmother   . Hypertension Maternal Grandmother   . Heart disease Maternal Grandfather   . Hypertension Maternal Grandfather   . Stroke Maternal Grandfather   . Stroke Paternal Grandfather     Allergies  Allergen Reactions  . Codeine Itching    REACTION: hives  . Cortisone     REACTION: palpitations  . 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  No allergy.  . Nitrofurantoin     REACTION: fever  . Prednisone     REACTION: palpitations    Current Outpatient Prescriptions on File Prior to Visit  Medication Sig Dispense Refill  . ALPRAZolam (XANAX) 0.5 MG tablet Take 1 tablet (0.5 mg total) by mouth 2 (two) times daily. 60 tablet 0  . amitriptyline (ELAVIL) 50 MG tablet TK 1 T PO QD  3  . BIOTIN PO Take 1 tablet by mouth  daily.    . calcium elemental as carbonate (TUMS ULTRA 1000) 400 MG tablet Chew 1,000 mg by mouth 2 (two) times daily.      . ferrous sulfate 325 (65 FE) MG tablet Take 1 tablet (325 mg total) by mouth 2 (two) times daily. 30 tablet 5  . fexofenadine (ALLEGRA) 180 MG tablet Take 180 mg by mouth daily as needed for allergies or rhinitis.    . Multiple Vitamin (MULTIVITAMIN) tablet Take 1 tablet by mouth 2 (two) times daily.      . Omega-3 Fatty Acids (FISH OIL) 1000 MG CAPS Take 1 capsule by mouth 2 (two) times daily.      Marland Kitchen omeprazole (PRILOSEC) 40 MG capsule Take 1 capsule (40 mg total) by mouth daily. 90 capsule 1  . oxybutynin (DITROPAN-XL) 10 MG 24 hr tablet TK 1 T PO  D  11  . PARoxetine (PAXIL) 30 MG tablet Take 1 tablet (30 mg total) by mouth every morning. 90 tablet 1  . phenazopyridine (PYRIDIUM) 200 MG tablet Take 200 mg by mouth daily as needed.  11  . valACYclovir (VALTREX) 500 MG tablet TAKE 1 TABLET BY MOUTH EVERY DAY. NEEDS APPT 30 tablet 0  . VITAMIN A PO Take 1 tablet by mouth daily.     . Vitamin D, Ergocalciferol, (DRISDOL) 50000 units CAPS capsule Take 1 capsule (50,000 Units total) by mouth 2 (two) times a week. 24 capsule 0  . XIIDRA 5 % SOLN INSTILL 1 DROP IN BOTH EYES BID  0   No current facility-administered medications on file prior to visit.    BP 127/52 mmHg  Pulse 103  Temp(Src) 98.6 F (37 C) (Oral)  Resp 18  Ht 5\' 5"  (1.651 m)  Wt 248 lb 9.6 oz (112.764 kg)  BMI 41.37 kg/m2  SpO2 98%    Objective:   Physical Exam  Constitutional: She is oriented to person, place, and time. She appears well-developed and well-nourished.  HENT:  Head: Normocephalic and atraumatic.  Cardiovascular: Normal rate, regular rhythm and normal heart sounds.   No murmur heard. Pulmonary/Chest: Effort normal and breath sounds normal. No respiratory distress. She has no wheezes.  Musculoskeletal: She exhibits no edema.  Neurological: She is alert and oriented to person, place,  and time.  Psychiatric: She  has a normal mood and affect. Her behavior is normal. Judgment and thought content normal.          Assessment & Plan:  Cystitis- pt reports hx of cystitis, followed by Dr. Diona Fanti.  UA is clean today. Doubt infection, but will send for culture to be sure. Advised follow up with Dr. Diona Fanti.

## 2015-03-27 NOTE — Addendum Note (Signed)
Addended by: Kelle Darting A on: 03/27/2015 09:37 AM   Modules accepted: Orders

## 2015-03-27 NOTE — Assessment & Plan Note (Signed)
Continues vit D twice weekly, obtain follow up vit D level.

## 2015-03-28 LAB — URINE CULTURE
Colony Count: NO GROWTH
Organism ID, Bacteria: NO GROWTH

## 2015-03-30 ENCOUNTER — Encounter: Payer: Self-pay | Admitting: Family

## 2015-03-30 MED ORDER — VITAMIN D (ERGOCALCIFEROL) 1.25 MG (50000 UNIT) PO CAPS: 50000.0000 [IU] | ORAL_CAPSULE | ORAL | Status: DC

## 2015-03-31 ENCOUNTER — Telehealth: Payer: Self-pay | Admitting: Family

## 2015-03-31 NOTE — Telephone Encounter (Signed)
Allergies updated.  

## 2015-03-31 NOTE — Telephone Encounter (Signed)
-----   Message from Ronny Flurry, [place] sent at 03/27/2015 10:00 AM EST ----- Regarding: allergies Pt told me she can tolerate codeine and cortisone and does not have an reactions to them any longer. Currently gets cortisone injections and states she is able to take cough medication with codeine without any problems.  She wants these removed from her allergy list.  Please advise?

## 2015-04-06 ENCOUNTER — Other Ambulatory Visit: Payer: Self-pay | Admitting: Family

## 2015-04-06 ENCOUNTER — Other Ambulatory Visit: Payer: Self-pay

## 2015-04-06 NOTE — Telephone Encounter (Signed)
Rx printed and forwarded to PCP for signature. 

## 2015-04-06 NOTE — Telephone Encounter (Signed)
Rx faxed to pharmacy  

## 2015-04-06 NOTE — Telephone Encounter (Signed)
Opened in error

## 2015-04-29 ENCOUNTER — Ambulatory Visit (INDEPENDENT_AMBULATORY_CARE_PROVIDER_SITE_OTHER): Payer: BLUE CROSS/BLUE SHIELD | Admitting: Family

## 2015-04-29 ENCOUNTER — Encounter: Payer: Self-pay | Admitting: Family

## 2015-04-29 VITALS — BP 128/89 | HR 88 | Temp 98.5°F | Wt 252.0 lb

## 2015-04-29 DIAGNOSIS — Z9109 Other allergy status, other than to drugs and biological substances: Secondary | ICD-10-CM

## 2015-04-29 DIAGNOSIS — Z91048 Other nonmedicinal substance allergy status: Secondary | ICD-10-CM | POA: Diagnosis not present

## 2015-04-29 MED ORDER — PREDNISONE 10 MG PO TABS: ORAL_TABLET | ORAL | Status: DC

## 2015-04-29 NOTE — Progress Notes (Signed)
Pre visit review using our clinic review tool, if applicable. No additional management support is needed unless otherwise documented below in the visit note. 

## 2015-04-29 NOTE — Progress Notes (Signed)
Subjective:    Patient ID: [name] [name], female    DOB: 11/23/1955, 60 y.o.   MRN: [id number]  HPI  [name] [name] is a 60 yr old female who presents today with 3 day history of allergies.  Reports allergies began after her husband tore up carpet in their great room and a lot of dust was stirred. Reports watery eyes, "buzzing in ears." Reports hx of epistaxis- therefore was instructed by ENT to avoid nasal steroids.  Denies fever or wheezing.  She continues allegra and has recently restarted asteline.   Review of Systems Past Medical History  Diagnosis Date  . GERD (gastroesophageal reflux disease)   . Allergy   . Osteoporosis   . Obesity     status post bariatric procedure in [facility] 2005  . Elevated alkaline phosphatase level     negative workup (presumed secondary to fatty liver)  . Thyroid nodule 7/10    `  . Interstitial cystitis     Dr Vernie Shanks  . 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 Thyroid nodule   . Anxiety   . Varicose vein of leg   . Neuromuscular disorder (HCC)     rt arm carpal tunnel syndrome  . Frequency-urgency syndrome   . Arthritis   . Anemia     takes iron supplement    Social History   Social History  . Marital Status: Divorced    Spouse Name: N/A  . Number of Children: 1  . Years of Education: N/A   Occupational History  . MANAGER    Social History Main Topics  . Smoking status: Never Smoker   . Smokeless tobacco: Never Used  . Alcohol Use: 0.6 oz/week    1 Glasses of wine per week     Comment: RARE  . Drug Use: No  . Sexual Activity: Yes   Other Topics Concern  . Not on file   Social History Narrative    Past Surgical History  Procedure Laterality Date  . Joint replacement  2008    left total knee  . Stomach surgery  2007    "tummy tuck"  . Gastric bypass  2005  . Eye surgery  1997    bilateral cataract extraction  . Knee surgery  1997    left knee  . Refractive surgery  2006    lasik  . Nasal sinus surgery  123XX123   Dr Erik Obey  . Cryotherapy  1980  . Colposcopy  1980  . Shoulder surgery  2 2012    RIGHT   . Foot surgery      bilateral bunionettes  . Cysto with hydrodistension  12/12/2011    Procedure: CYSTOSCOPY/HYDRODISTENSION;  Surgeon: Franchot Gallo, MD;  Location: [facility];  Service: Urology;  Laterality: N/A;  30 MIN   . Bladder surgery  02/21/12 and 02/28/12    Dr Diona Fanti  . Varicose vein surgery Right 02/2012    leg  . Anterior cervical decomp/discectomy fusion N/A 06/21/2012    Procedure: ANTERIOR CERVICAL DECOMPRESSION/DISCECTOMY FUSION C-4 - C5 (SPACER/DePUY CERVICAL PLATE ONLY) 1 LEVEL;  Surgeon: Melina Schools, MD;  Location: [place];  Service: Orthopedics;  Laterality: N/A;  . Foot surgery Right 01/07/14    Pt states surgery was due to arthritis. "Has pins in place now"    Family History  Problem Relation Age of Onset  . Diabetes Paternal Grandfather   . Hyperlipidemia Mother   . Hypertension Mother   . Hypertension Father   .  Diabetes Father   . Prostate cancer Father   . Heart disease Maternal Grandmother   . Hypertension Maternal Grandmother   . Heart disease Maternal Grandfather   . Hypertension Maternal Grandfather   . Stroke Maternal Grandfather   . Stroke Paternal Grandfather     Allergies  Allergen Reactions  . 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  No allergy.  . Nitrofurantoin     REACTION: fever  . Prednisone     REACTION: palpitations    Current Outpatient Prescriptions on File Prior to Visit  Medication Sig Dispense Refill  . ALPRAZolam (XANAX) 0.5 MG tablet TAKE 1 TABLET BY MOUTH 2 TIMES DAILY 60 tablet 0  . amitriptyline (ELAVIL) 50 MG tablet TK 1 T PO QD  3  . BIOTIN PO Take 1 tablet by mouth daily.    . calcium elemental as carbonate (TUMS ULTRA 1000) 400 MG tablet Chew 1,000 mg by mouth 2 (two) times daily.      . ferrous sulfate 325 (65 FE) MG tablet Take 1 tablet (325 mg total) by mouth 2  (two) times daily. 30 tablet 5  . fexofenadine (ALLEGRA) 180 MG tablet Take 180 mg by mouth daily as needed for allergies or rhinitis.    . Multiple Vitamin (MULTIVITAMIN) tablet Take 1 tablet by mouth 2 (two) times daily.      . Omega-3 Fatty Acids (FISH OIL) 1000 MG CAPS Take 1 capsule by mouth 2 (two) times daily.      Marland Kitchen omeprazole (PRILOSEC) 40 MG capsule Take 1 capsule (40 mg total) by mouth daily. 90 capsule 1  . oxybutynin (DITROPAN-XL) 10 MG 24 hr tablet TK 1 T PO  D  11  . PARoxetine (PAXIL) 30 MG tablet Take 1 tablet (30 mg total) by mouth every morning. 90 tablet 1  . phenazopyridine (PYRIDIUM) 200 MG tablet Take 200 mg by mouth daily as needed.  11  . valACYclovir (VALTREX) 500 MG tablet TAKE 1 TABLET BY MOUTH EVERY DAY. NEEDS APPT 30 tablet 0  . VITAMIN A PO Take 1 tablet by mouth daily.     . Vitamin D, Ergocalciferol, (DRISDOL) 50000 units CAPS capsule Take 1 capsule (50,000 Units total) by mouth 2 (two) times a week. 24 capsule 0  . XIIDRA 5 % SOLN INSTILL 1 DROP IN BOTH EYES BID  0   No current facility-administered medications on file prior to visit.    BP 128/89 mmHg  Pulse 88  Temp(Src) 98.5 F (36.9 C) (Oral)  Wt 252 lb (114.306 kg)  SpO2 99%       Objective:   Physical Exam  Constitutional: She is oriented to person, place, and time. She appears well-developed and well-nourished.  HENT:  Head: Normocephalic and atraumatic.  Right Ear: Tympanic membrane and ear canal normal.  Left Ear: Tympanic membrane and ear canal normal.  Nose: No epistaxis.  Mouth/Throat: No oropharyngeal exudate, posterior oropharyngeal edema or posterior oropharyngeal erythema.  + hoarseness   Eyes: Pupils are equal, round, and reactive to light. Left eye exhibits no discharge.  Neck: Neck supple.  Cardiovascular: Normal rate, regular rhythm and normal heart sounds.   No murmur heard. Pulmonary/Chest: Effort normal and breath sounds normal. No respiratory distress. She has no  wheezes.  Lymphadenopathy:    She has cervical adenopathy.  Neurological: She is alert and oriented to person, place, and time.  Psychiatric: She has a normal mood and affect. Her behavior is  normal. Judgment and thought content normal.          Assessment & Plan:  Environmental allergies- reports steroids "are the only thing that works when this happens." Typically sees her ENT who treats her with steroids.  Will give a short course of prednisone (pt reports she has tolerated this in the past). She is advised to call if she develops fever/swelling, if symptoms worsen, or if symptoms do not improve.

## 2015-04-29 NOTE — Patient Instructions (Signed)
Please call if your symptoms worsen or if symptoms do not improve. Continue allegra and asteline.  Start prednisone.

## 2015-05-07 ENCOUNTER — Telehealth: Payer: Self-pay | Admitting: Family

## 2015-05-07 ENCOUNTER — Encounter: Payer: Self-pay | Admitting: Medical

## 2015-05-07 ENCOUNTER — Ambulatory Visit (INDEPENDENT_AMBULATORY_CARE_PROVIDER_SITE_OTHER): Payer: BLUE CROSS/BLUE SHIELD | Admitting: Medical

## 2015-05-07 VITALS — BP 124/88 | HR 90 | Temp 98.7°F | Ht 65.0 in | Wt 248.2 lb

## 2015-05-07 DIAGNOSIS — J0100 Acute maxillary sinusitis, unspecified: Secondary | ICD-10-CM | POA: Diagnosis not present

## 2015-05-07 DIAGNOSIS — R059 Cough, unspecified: Secondary | ICD-10-CM

## 2015-05-07 DIAGNOSIS — J309 Allergic rhinitis, unspecified: Secondary | ICD-10-CM | POA: Diagnosis not present

## 2015-05-07 DIAGNOSIS — R05 Cough: Secondary | ICD-10-CM | POA: Diagnosis not present

## 2015-05-07 MED ORDER — BENZONATATE 200 MG PO CAPS: 200.0000 mg | ORAL_CAPSULE | Freq: Three times a day (TID) | ORAL | Status: DC | PRN

## 2015-05-07 MED ORDER — AMOXICILLIN-POT CLAVULANATE 875-125 MG PO TABS: 1.0000 | ORAL_TABLET | Freq: Two times a day (BID) | ORAL | Status: DC

## 2015-05-07 NOTE — Telephone Encounter (Signed)
Pt called in, she says that she was seen on 3/15 and still isn't feeling better. Pt says that she was told to call back in if not feeling better. She says that she has finished all of her meds that was prescribed.  Pt is requesting a call back to be advised further.   CB: [phone]

## 2015-05-07 NOTE — Telephone Encounter (Signed)
Patient called back to follow up on message left this morning. Plse adv patient [phone]

## 2015-05-07 NOTE — Progress Notes (Signed)
Subjective:    Patient ID: [name] [name], female    DOB: 1955/03/02, 60 y.o.   MRN: [id number]  HPI  Pt 2 wks ago got nasal  congested after exposure to a lot of dust. She was seen by pcp and given 4 days of prednisone. She got little better but not completely. But states never got better completely. Now  she been sneezing some. Pt has some sinus pressure that gradually developed. Some colored mucous when blows nose. Coughing some but dry. No wheezing.  Pt has been using some astelin. She was told not to use nasonex by ENT due to nose bleed history.  Review of Systems  Constitutional: Negative for fever, chills and fatigue.  HENT: Positive for congestion, postnasal drip and sinus pressure. Negative for ear pain.   Respiratory: Positive for cough. Negative for chest tightness, shortness of breath and wheezing.   Cardiovascular: Negative for chest pain and palpitations.  Gastrointestinal: Negative for abdominal pain.  Musculoskeletal: Negative for back pain.  Neurological: Negative for dizziness and headaches.  Hematological: Negative for adenopathy. Does not bruise/bleed easily.  Psychiatric/Behavioral: Negative for behavioral problems and confusion.   Past Medical History  Diagnosis Date  . GERD (gastroesophageal reflux disease)   . Allergy   . Osteoporosis   . Obesity     status post bariatric procedure in [facility] 2005  . Elevated alkaline phosphatase level     negative workup (presumed secondary to fatty liver)  . Thyroid nodule 7/10    `  . Interstitial cystitis     Dr Vernie Shanks  . 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 Thyroid nodule   . Anxiety   . Varicose vein of leg   . Neuromuscular disorder (HCC)     rt arm carpal tunnel syndrome  . Frequency-urgency syndrome   . Arthritis   . Anemia     takes iron supplement    Social History   Social History  . Marital Status: Divorced    Spouse Name: N/A  . Number of Children: 1  . Years of Education: N/A    Occupational History  . MANAGER    Social History Main Topics  . Smoking status: Never Smoker   . Smokeless tobacco: Never Used  . Alcohol Use: 0.6 oz/week    1 Glasses of wine per week     Comment: RARE  . Drug Use: No  . Sexual Activity: Yes   Other Topics Concern  . Not on file   Social History Narrative    Past Surgical History  Procedure Laterality Date  . Joint replacement  2008    left total knee  . Stomach surgery  2007    "tummy tuck"  . Gastric bypass  2005  . Eye surgery  1997    bilateral cataract extraction  . Knee surgery  1997    left knee  . Refractive surgery  2006    lasik  . Nasal sinus surgery  123XX123    Dr Erik Obey  . Cryotherapy  1980  . Colposcopy  1980  . Shoulder surgery  2 2012    RIGHT   . Foot surgery      bilateral bunionettes  . Cysto with hydrodistension  12/12/2011    Procedure: CYSTOSCOPY/HYDRODISTENSION;  Surgeon: Franchot Gallo, MD;  Location: [facility];  Service: Urology;  Laterality: N/A;  30 MIN   . Bladder surgery  02/21/12 and 02/28/12    Dr Diona Fanti  . Varicose  vein surgery Right 02/2012    leg  . Anterior cervical decomp/discectomy fusion N/A 06/21/2012    Procedure: ANTERIOR CERVICAL DECOMPRESSION/DISCECTOMY FUSION C-4 - C5 (SPACER/DePUY CERVICAL PLATE ONLY) 1 LEVEL;  Surgeon: Melina Schools, MD;  Location: [place];  Service: Orthopedics;  Laterality: N/A;  . Foot surgery Right 01/07/14    Pt states surgery was due to arthritis. "Has pins in place now"    Family History  Problem Relation Age of Onset  . Diabetes Paternal Grandfather   . Hyperlipidemia Mother   . Hypertension Mother   . Hypertension Father   . Diabetes Father   . Prostate cancer Father   . Heart disease Maternal Grandmother   . Hypertension Maternal Grandmother   . Heart disease Maternal Grandfather   . Hypertension Maternal Grandfather   . Stroke Maternal Grandfather   . Stroke Paternal Grandfather     Allergies  Allergen  Reactions  . 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  No allergy.  . Nitrofurantoin     REACTION: fever  . Prednisone     REACTION: palpitations    Current Outpatient Prescriptions on File Prior to Visit  Medication Sig Dispense Refill  . ALPRAZolam (XANAX) 0.5 MG tablet TAKE 1 TABLET BY MOUTH 2 TIMES DAILY 60 tablet 0  . amitriptyline (ELAVIL) 50 MG tablet TK 1 T PO QD  3  . BIOTIN PO Take 1 tablet by mouth daily.    . calcium elemental as carbonate (TUMS ULTRA 1000) 400 MG tablet Chew 1,000 mg by mouth 2 (two) times daily.      . ferrous sulfate 325 (65 FE) MG tablet Take 1 tablet (325 mg total) by mouth 2 (two) times daily. 30 tablet 5  . fexofenadine (ALLEGRA) 180 MG tablet Take 180 mg by mouth daily as needed for allergies or rhinitis.    . Multiple Vitamin (MULTIVITAMIN) tablet Take 1 tablet by mouth 2 (two) times daily.      . Omega-3 Fatty Acids (FISH OIL) 1000 MG CAPS Take 1 capsule by mouth 2 (two) times daily.      Marland Kitchen omeprazole (PRILOSEC) 40 MG capsule Take 1 capsule (40 mg total) by mouth daily. 90 capsule 1  . oxybutynin (DITROPAN-XL) 10 MG 24 hr tablet TK 1 T PO  D  11  . PARoxetine (PAXIL) 30 MG tablet Take 1 tablet (30 mg total) by mouth every morning. 90 tablet 1  . phenazopyridine (PYRIDIUM) 200 MG tablet Take 200 mg by mouth daily as needed.  11  . valACYclovir (VALTREX) 500 MG tablet TAKE 1 TABLET BY MOUTH EVERY DAY. NEEDS APPT 30 tablet 0  . VITAMIN A PO Take 1 tablet by mouth daily.     . Vitamin D, Ergocalciferol, (DRISDOL) 50000 units CAPS capsule Take 1 capsule (50,000 Units total) by mouth 2 (two) times a week. 24 capsule 0  . XIIDRA 5 % SOLN INSTILL 1 DROP IN BOTH EYES BID  0   No current facility-administered medications on file prior to visit.    BP 124/88 mmHg  Pulse 90  Temp(Src) 98.7 F (37.1 C) (Oral)  Ht 5\' 5"  (1.651 m)  Wt 248 lb 3.2 oz (112.583 kg)  BMI 41.30 kg/m2  SpO2 98%       Objective:   Physical  Exam  General  Mental Status - Alert. General Appearance - Well groomed. Not in acute distress.  Skin Rashes- No Rashes.  HEENT Head- Normal. Ear  Auditory Canal - Left- Normal. Right - Normal.Tympanic Membrane- Left- Normal. Right- Normal. Eye Sclera/Conjunctiva- Left- Normal. Right- Normal. Nose & Sinuses Nasal Mucosa- Left-  Boggy and Congested. Right-  Boggy and  Congested.Bilateral maxillary and frontal sinus pressure. Mouth & Throat Lips: Upper Lip- Normal: no dryness, cracking, pallor, cyanosis, or vesicular eruption. Lower Lip-Normal: no dryness, cracking, pallor, cyanosis or vesicular eruption. Buccal Mucosa- Bilateral- No Aphthous ulcers. Oropharynx- No Discharge or Erythema. +pnd Tonsils: Characteristics- Bilateral- No Erythema or Congestion. Size/Enlargement- Bilateral- No enlargement. Discharge- bilateral-None.  Neck Neck- Supple. No Masses.   Chest and Lung Exam Auscultation: Breath Sounds:-Clear even and unlabored.  Cardiovascular Auscultation:Rythm- Regular, rate and rhythm. Murmurs & Other Heart Sounds:Ausculatation of the heart reveal- No Murmurs.  Lymphatic Head & Neck General Head & Neck Lymphatics: Bilateral: Description- No Localized lymphadenopathy.       Assessment & Plan:  You appear to have a sinus infection. I am prescribing augmentin antibiotic for the infection. To help with the nasal congestion use your astelin. For your associated cough, I prescribed  benzonatate cough tab.  You asked for 14 days of augmentin. If you feel completley better with 10 days can hold remaining 4 days of augmentin.  Rest, hydrate, tylenol for fever. If symptoms persist consider CT of sinus.  Follow up in 7 days or as needed.

## 2015-05-07 NOTE — Progress Notes (Signed)
Pre visit review using our clinic review tool, if applicable. No additional management support is needed unless otherwise documented below in the visit note. 

## 2015-05-07 NOTE — Telephone Encounter (Signed)
Pt has an appointment on 05/06/15 with Mackie Pai. Cancelled 05/08/15 appointment.

## 2015-05-07 NOTE — Patient Instructions (Addendum)
You appear to have a sinus infection. I am prescribing augmentin antibiotic for the infection. To help with the nasal congestion use your astelin. For your associated cough, I prescribed  benzonatate cough tab.  You asked for 14 days of augmentin. If you feel completley better with 10 days can hold remaining 4 days of augmentin.  Rest, hydrate, tylenol for fever. If symptoms persist consider CT of sinus.  Follow up in 7 days or as needed.

## 2015-05-08 ENCOUNTER — Ambulatory Visit: Payer: BLUE CROSS/BLUE SHIELD | Admitting: Family

## 2015-05-12 ENCOUNTER — Encounter: Payer: Self-pay | Admitting: Family

## 2015-06-19 ENCOUNTER — Other Ambulatory Visit: Payer: Self-pay | Admitting: Family Medicine

## 2015-06-19 NOTE — Telephone Encounter (Signed)
Your patient requesting Alprazolam refill

## 2015-06-23 ENCOUNTER — Other Ambulatory Visit: Payer: Self-pay | Admitting: Family

## 2015-06-23 ENCOUNTER — Encounter: Payer: Self-pay | Admitting: Emergency Medicine

## 2015-06-23 NOTE — Telephone Encounter (Signed)
Opened in error

## 2015-06-23 NOTE — Telephone Encounter (Signed)
Pt requesting vitamin D refill, #24. Last Vit D level 03/2015. Ok to send refill?

## 2015-06-23 NOTE — Telephone Encounter (Signed)
See rx. 

## 2015-06-23 NOTE — Telephone Encounter (Signed)
Rx faxed to pharmacy  

## 2015-07-01 ENCOUNTER — Encounter: Payer: Self-pay | Admitting: Family

## 2015-07-01 ENCOUNTER — Ambulatory Visit (INDEPENDENT_AMBULATORY_CARE_PROVIDER_SITE_OTHER): Payer: BLUE CROSS/BLUE SHIELD | Admitting: Family

## 2015-07-01 MED ORDER — NALTREXONE-BUPROPION HCL ER 8-90 MG PO TB12: ORAL_TABLET | ORAL | Status: DC

## 2015-07-01 NOTE — Progress Notes (Signed)
Pre visit review using our clinic review tool, if applicable. No additional management support is needed unless otherwise documented below in the visit note. 

## 2015-07-01 NOTE — Patient Instructions (Signed)
Begin contrave. Try to add water aerobics/water walking for exercise. Continue to work on diet.

## 2015-07-01 NOTE — Assessment & Plan Note (Signed)
Discussed water aerobics/water walking for exercise. Discussed diet. Trial of contrave. 15 min spent with pt today. >50% of this time was spent counseling patient on obesity and weight loss.

## 2015-07-01 NOTE — Progress Notes (Signed)
Subjective:    Patient ID: [name] [name], female    DOB: 1955/04/17, 60 y.o.   MRN: [id number]  HPI  [name] is a 60 yr old female who presents today to discuss obesity/weight loss options.  She is s/p gastric bypass 2005. Got down to 185, then stayed at 215 for a while.  Recently gaining.  She needs a right knee and right ankle replacement this fall.  Wants to work on weight loss prior to surgery. Reports that she is not exercising due to pain.   Wt Readings from Last 3 Encounters:  07/01/15 247 lb 3.2 oz (112.129 kg)  05/07/15 248 lb 3.2 oz (112.583 kg)  04/29/15 252 lb (114.306 kg)   Review of Systems    see HPI  Past Medical History  Diagnosis Date  . GERD (gastroesophageal reflux disease)   . Allergy   . Osteoporosis   . Obesity     status post bariatric procedure in [facility] 2005  . Elevated alkaline phosphatase level     negative workup (presumed secondary to fatty liver)  . Thyroid nodule 7/10    `  . Interstitial cystitis     Dr Vernie Shanks  . 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 Thyroid nodule   . Anxiety   . Varicose vein of leg   . Neuromuscular disorder (HCC)     rt arm carpal tunnel syndrome  . Frequency-urgency syndrome   . Arthritis   . Anemia     takes iron supplement     Social History   Social History  . Marital Status: Divorced    Spouse Name: N/A  . Number of Children: 1  . Years of Education: N/A   Occupational History  . MANAGER    Social History Main Topics  . Smoking status: Never Smoker   . Smokeless tobacco: Never Used  . Alcohol Use: 0.6 oz/week    1 Glasses of wine per week     Comment: RARE  . Drug Use: No  . Sexual Activity: Yes   Other Topics Concern  . Not on file   Social History Narrative    Past Surgical History  Procedure Laterality Date  . Joint replacement  2008    left total knee  . Stomach surgery  2007    "tummy tuck"  . Gastric bypass  2005  . Eye surgery  1997    bilateral cataract extraction   . Knee surgery  1997    left knee  . Refractive surgery  2006    lasik  . Nasal sinus surgery  [id number]    Dr Erik Obey  . Cryotherapy  1980  . Colposcopy  1980  . Shoulder surgery  2 2012    RIGHT   . Foot surgery      bilateral bunionettes  . Cysto with hydrodistension  12/12/2011    Procedure: CYSTOSCOPY/HYDRODISTENSION;  Surgeon: Franchot Gallo, MD;  [facility];  Service: Urology;  Laterality: N/A;  30 MIN   . Bladder surgery  02/21/12 and 02/28/12    Dr Diona Fanti  . Varicose vein surgery Right 02/2012    leg  . Anterior cervical decomp/discectomy fusion N/A 06/21/2012    Procedure: ANTERIOR CERVICAL DECOMPRESSION/DISCECTOMY FUSION C-4 - C5 (SPACER/DePUY CERVICAL PLATE ONLY) 1 LEVEL;  Surgeon: Melina Schools, MD;  Location: [place];  Service: Orthopedics;  Laterality: N/A;  . Foot surgery Right 01/07/14    Pt states surgery was due to arthritis. "  Has pins in place now"    Family History  Problem Relation Age of Onset  . Diabetes Paternal Grandfather   . Hyperlipidemia Mother   . Hypertension Mother   . Hypertension Father   . Diabetes Father   . Prostate cancer Father   . Heart disease Maternal Grandmother   . Hypertension Maternal Grandmother   . Heart disease Maternal Grandfather   . Hypertension Maternal Grandfather   . Stroke Maternal Grandfather   . Stroke Paternal Grandfather     Allergies  Allergen Reactions  . 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  No allergy.  . Nitrofurantoin     REACTION: fever  . Prednisone     REACTION: palpitations.  06/1715  Pt states she can now tolerate prednisone.    Current Outpatient Prescriptions on File Prior to Visit  Medication Sig Dispense Refill  . ALPRAZolam (XANAX) 0.5 MG tablet TAKE 1 TABLET BY MOUTH TWICE A DAY 60 tablet 0  . amitriptyline (ELAVIL) 50 MG tablet TK 1 T PO QD  3  . BIOTIN PO Take 1 tablet by mouth daily.    . calcium elemental as carbonate  (TUMS ULTRA 1000) 400 MG tablet Chew 1,000 mg by mouth 2 (two) times daily.      . ferrous sulfate 325 (65 FE) MG tablet Take 1 tablet (325 mg total) by mouth 2 (two) times daily. 30 tablet 5  . fexofenadine (ALLEGRA) 180 MG tablet Take 180 mg by mouth daily as needed for allergies or rhinitis.    . Multiple Vitamin (MULTIVITAMIN) tablet Take 1 tablet by mouth 2 (two) times daily.      . Omega-3 Fatty Acids (FISH OIL) 1000 MG CAPS Take 1 capsule by mouth 2 (two) times daily.      Marland Kitchen omeprazole (PRILOSEC) 40 MG capsule Take 1 capsule (40 mg total) by mouth daily. 90 capsule 1  . oxybutynin (DITROPAN-XL) 10 MG 24 hr tablet TK 1 T PO  D  11  . PARoxetine (PAXIL) 30 MG tablet Take 1 tablet (30 mg total) by mouth every morning. 90 tablet 1  . phenazopyridine (PYRIDIUM) 200 MG tablet Take 200 mg by mouth daily as needed.  11  . valACYclovir (VALTREX) 500 MG tablet TAKE 1 TABLET BY MOUTH EVERY DAY. NEEDS APPT 30 tablet 0  . VITAMIN A PO Take 1 tablet by mouth daily.     . Vitamin D, Ergocalciferol, (DRISDOL) 50000 units CAPS capsule TAKE 1 CAPSULE (50,000 UNITS TOTAL) BY MOUTH 2 (TWO) TIMES A WEEK. 24 capsule 0  . XIIDRA 5 % SOLN INSTILL 1 DROP IN BOTH EYES BID  0   No current facility-administered medications on file prior to visit.    BP 132/88 mmHg  Pulse 85  Temp(Src) 98.3 F (36.8 C) (Oral)  Resp 18  Ht 5\' 5"  (1.651 m)  Wt 247 lb 3.2 oz (112.129 kg)  BMI 41.14 kg/m2  SpO2 97%    Objective:   Physical Exam  Constitutional: She is oriented to person, place, and time. She appears well-developed and well-nourished.  HENT:  Head: Normocephalic and atraumatic.  Musculoskeletal:  + swelling right ankle  Neurological: She is alert and oriented to person, place, and time.  Psychiatric: She has a normal mood and affect. Her behavior is normal. Judgment and thought content normal.          Assessment & Plan:

## 2015-07-02 ENCOUNTER — Telehealth: Payer: Self-pay | Admitting: Family

## 2015-07-02 NOTE — Telephone Encounter (Signed)
Pt called in because she says that she was written a Rx for CONTRAVE. Pt says that she has a coupon for 70.00 off if written for 70 pills, her current Rx is written for 50. She would like to know if the Rx could be changed to 70 pills instead so that she could take advantage of the coupon.    ALSO she says that the a PA is needed for this so she has the pharmacy faxing over form. She would like to go ahead an pay out of pocket for first Rx and going forward have insurance cover.

## 2015-07-03 MED ORDER — NALTREXONE-BUPROPION HCL ER 8-90 MG PO TB12: ORAL_TABLET | ORAL | Status: DC

## 2015-07-03 NOTE — Telephone Encounter (Signed)
[facility] with [facility] at [facility] and increased quantity to #70. Called [facility] at [phone] and spoke with [place]. Obtained approval for Contrave for 6 months. Left detailed message on pt's cell# and to call if any questions.

## 2015-07-03 NOTE — Telephone Encounter (Signed)
Please contact pharmacy and change quantity to 70 tabs please.

## 2015-08-04 ENCOUNTER — Ambulatory Visit: Payer: BLUE CROSS/BLUE SHIELD | Admitting: Family

## 2015-08-07 ENCOUNTER — Other Ambulatory Visit: Payer: Self-pay | Admitting: Family

## 2015-08-07 NOTE — Telephone Encounter (Signed)
Last OV: 07/01/2015 Last filled: 06/23/2015 Sig: TAKE 1 TABLET BY MOUTH TWICE A DAY UDS: 03/27/2015, negative for Xanax, moderate risk

## 2015-08-07 NOTE — Telephone Encounter (Signed)
Rx phoned to pharmacy. Pt scheduled lab appt next week for UDS.

## 2015-08-07 NOTE — Telephone Encounter (Signed)
Ok to send rx for 60 tabs,  however she is due for a UDS.

## 2015-08-11 ENCOUNTER — Other Ambulatory Visit: Payer: BLUE CROSS/BLUE SHIELD

## 2015-09-02 ENCOUNTER — Encounter: Payer: Self-pay | Admitting: Family

## 2015-09-11 ENCOUNTER — Ambulatory Visit (INDEPENDENT_AMBULATORY_CARE_PROVIDER_SITE_OTHER): Payer: BLUE CROSS/BLUE SHIELD | Admitting: Family

## 2015-09-11 ENCOUNTER — Encounter: Payer: Self-pay | Admitting: Family

## 2015-09-11 VITALS — BP 130/90 | HR 104 | Temp 98.1°F | Resp 18 | Ht 65.0 in | Wt 243.2 lb

## 2015-09-11 DIAGNOSIS — J019 Acute sinusitis, unspecified: Secondary | ICD-10-CM

## 2015-09-11 MED ORDER — AMOXICILLIN-POT CLAVULANATE 875-125 MG PO TABS: 1.0000 | ORAL_TABLET | Freq: Two times a day (BID) | ORAL | 0 refills | Status: DC

## 2015-09-11 NOTE — Progress Notes (Signed)
Pre visit review using our clinic review tool, if applicable. No additional management support is needed unless otherwise documented below in the visit note. 

## 2015-09-11 NOTE — Progress Notes (Signed)
Subjective:    Patient ID: [name] [name], female    DOB: December 21, 1955, 60 y.o.   MRN: [id number]  HPI  [name] is a 60 yr old female who presents today with chief complaint of headache and nasal congestion. Has associated "light headedness."  Reports yellow drainage x 3 weeks.  Denies associated fever.     Review of Systems    see HPI  Past Medical History:  Diagnosis Date  . Allergy   . Anemia    takes iron supplement  . Anxiety   . Arthritis   . 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 Elevated alkaline phosphatase level    negative workup (presumed secondary to fatty liver)  . Frequency-urgency syndrome   . GERD (gastroesophageal reflux disease)   . Interstitial cystitis    Dr Vernie Shanks  . Neuromuscular disorder (HCC)    rt arm carpal tunnel syndrome  . Obesity    status post bariatric procedure in [facility] 2005  . Osteoporosis   . Thyroid nodule 7/10   `  . Thyroid nodule   . Varicose vein of leg      Social History   Social History  . Marital status: Divorced    Spouse name: N/A  . Number of children: 1  . Years of education: N/A   Occupational History  . [employer]   Social History Main Topics  . Smoking status: Never Smoker  . Smokeless tobacco: Never Used  . Alcohol use 0.6 oz/week    1 Glasses of wine per week     Comment: RARE  . Drug use: No  . Sexual activity: Yes   Other Topics Concern  . Not on file   Social History Narrative  . No narrative on file    Past Surgical History:  Procedure Laterality Date  . ANTERIOR CERVICAL DECOMP/DISCECTOMY FUSION N/A 06/21/2012   Procedure: ANTERIOR CERVICAL DECOMPRESSION/DISCECTOMY FUSION C-4 - C5 (SPACER/DePUY CERVICAL PLATE ONLY) 1 LEVEL;  Surgeon: Melina Schools, MD;  Location: [place];  Service: Orthopedics;  Laterality: N/A;  . BLADDER SURGERY  02/21/12 and 02/28/12   Dr Diona Fanti  . COLPOSCOPY  1980  . CRYOTHERAPY  1980  . CYSTO WITH HYDRODISTENSION  12/12/2011   Procedure:  CYSTOSCOPY/HYDRODISTENSION;  Surgeon: Franchot Gallo, MD;  Location: [facility];  Service: Urology;  Laterality: N/A;  30 MIN   . EYE SURGERY  1997   bilateral cataract extraction  . FOOT SURGERY     bilateral bunionettes  . FOOT SURGERY Right 01/07/14   Pt states surgery was due to arthritis. "Has pins in place now"  . GASTRIC BYPASS  2005  . JOINT REPLACEMENT  2008   left total knee  . KNEE SURGERY  1997   left knee  . NASAL SINUS SURGERY  123XX123   Dr Erik Obey  . REFRACTIVE SURGERY  2006   lasik  . SHOULDER SURGERY  2 2012   RIGHT   . STOMACH SURGERY  2007   "tummy tuck"  . VARICOSE VEIN SURGERY Right 02/2012   leg    Family History  Problem Relation Age of Onset  . Diabetes Paternal Grandfather   . Hyperlipidemia Mother   . Hypertension Mother   . Hypertension Father   . Diabetes Father   . Prostate cancer Father   . Heart disease Maternal Grandmother   . Hypertension Maternal Grandmother   . Heart disease Maternal Grandfather   . Hypertension Maternal Grandfather   .  Stroke Maternal Grandfather   . Stroke Paternal Grandfather     Allergies  Allergen Reactions  . 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  No allergy.  . Nitrofurantoin     REACTION: fever  . Prednisone     REACTION: palpitations.  06/1715  Pt states she can now tolerate prednisone.    Current Outpatient Prescriptions on File Prior to Visit  Medication Sig Dispense Refill  . ALPRAZolam (XANAX) 0.5 MG tablet TAKE 1 TABLET TWICE A DAY 60 tablet 0  . amitriptyline (ELAVIL) 50 MG tablet TK 1 T PO QD  3  . BIOTIN PO Take 1 tablet by mouth daily.    . calcium elemental as carbonate (TUMS ULTRA 1000) 400 MG tablet Chew 1,000 mg by mouth 2 (two) times daily.      . ferrous sulfate 325 (65 FE) MG tablet Take 1 tablet (325 mg total) by mouth 2 (two) times daily. 30 tablet 5  . fexofenadine (ALLEGRA) 180 MG tablet Take 180 mg by mouth daily as needed for  allergies or rhinitis.    . Multiple Vitamin (MULTIVITAMIN) tablet Take 1 tablet by mouth 2 (two) times daily.      . Omega-3 Fatty Acids (FISH OIL) 1000 MG CAPS Take 1 capsule by mouth 2 (two) times daily.      Marland Kitchen omeprazole (PRILOSEC) 40 MG capsule Take 1 capsule (40 mg total) by mouth daily. 90 capsule 1  . oxybutynin (DITROPAN-XL) 10 MG 24 hr tablet TK 1 T PO  D  11  . PARoxetine (PAXIL) 30 MG tablet Take 1 tablet (30 mg total) by mouth every morning. 90 tablet 1  . phenazopyridine (PYRIDIUM) 200 MG tablet Take 200 mg by mouth daily as needed.  11  . valACYclovir (VALTREX) 500 MG tablet TAKE 1 TABLET BY MOUTH EVERY DAY. NEEDS APPT 30 tablet 0  . VITAMIN A PO Take 1 tablet by mouth daily.     . Vitamin D, Ergocalciferol, (DRISDOL) 50000 units CAPS capsule TAKE 1 CAPSULE (50,000 UNITS TOTAL) BY MOUTH 2 (TWO) TIMES A WEEK. 24 capsule 0  . XIIDRA 5 % SOLN INSTILL 1 DROP IN BOTH EYES BID  0   No current facility-administered medications on file prior to visit.     BP 130/90   Pulse (!) 104   Temp 98.1 F (36.7 C) (Oral)   Resp 18   Ht 5\' 5"  (1.651 m)   Wt 243 lb 3.2 oz (110.3 kg)   SpO2 97% Comment: room air  BMI 40.47 kg/m    Objective:   Physical Exam  Constitutional: She is oriented to person, place, and time. She appears well-developed and well-nourished.  HENT:  Head: Normocephalic and atraumatic.  Right Ear: Tympanic membrane and ear canal normal.  Left Ear: Tympanic membrane and ear canal normal.  Nose: Right sinus exhibits maxillary sinus tenderness and frontal sinus tenderness.  Mouth/Throat: No oropharyngeal exudate, posterior oropharyngeal edema or posterior oropharyngeal erythema.  Milder L frontal and left maxillary sinus tenderness to palpation  Cardiovascular: Normal rate, regular rhythm and normal heart sounds.   No murmur heard. Pulmonary/Chest: Effort normal and breath sounds normal. No respiratory distress. She has no wheezes.  Neurological: She is alert and  oriented to person, place, and time.  Skin: Skin is warm and dry.  Psychiatric: She has a normal mood and affect. Her behavior is normal. Judgment and thought content normal.  Assessment & Plan:  Sinusitis- advised pt to begin augmentin. Advised nasal saline washes.  She is advised to call if symptoms worsen or do not improve. She reports that sometimes she has required 14 day course of abx from ENT. I gave her 10 days but asked her to call me before she is done with this rx if not better.

## 2015-09-11 NOTE — Patient Instructions (Signed)
Please begin augmentin for sinus infection. Call if new/worsening symptoms or if not improved in 1 week.

## 2015-09-13 ENCOUNTER — Telehealth: Payer: Self-pay | Admitting: Family

## 2015-09-13 NOTE — Telephone Encounter (Signed)
[facility] to cancel [facility] for augmentin as this was duplicate.

## 2015-09-15 ENCOUNTER — Other Ambulatory Visit: Payer: Self-pay | Admitting: Family

## 2015-09-15 NOTE — Telephone Encounter (Signed)
[name] [name]-- last vitamin D was checked 03/2015. Pt requesting refill of 50,000 units once a week.  Please advise?

## 2015-09-17 ENCOUNTER — Other Ambulatory Visit: Payer: Self-pay | Admitting: Family

## 2015-09-18 NOTE — Telephone Encounter (Signed)
Rx faxed to pharmacy  

## 2015-09-18 NOTE — Telephone Encounter (Signed)
Last alprazolam Rx: 08/07/15, #60 Last ov: 09/11/15 UDS: moderate, 08/11/15  Rx printed and forwarded to PCP for signature.

## 2015-10-10 ENCOUNTER — Other Ambulatory Visit: Payer: Self-pay | Admitting: Family

## 2015-10-12 NOTE — Telephone Encounter (Signed)
Hold request for refill until Friday August 1st, too early to fill. Last fill 09/18/15 #60 0 refill.

## 2015-10-13 ENCOUNTER — Other Ambulatory Visit: Payer: Self-pay | Admitting: Family

## 2015-10-16 NOTE — Telephone Encounter (Signed)
Rx faxed to pharmacy  

## 2015-10-16 NOTE — Telephone Encounter (Signed)
Rx printed and forwarded to PCP for signature. 

## 2015-10-28 ENCOUNTER — Other Ambulatory Visit: Payer: Self-pay | Admitting: Family

## 2015-10-28 DIAGNOSIS — Z1231 Encounter for screening mammogram for malignant neoplasm of breast: Secondary | ICD-10-CM

## 2015-10-30 ENCOUNTER — Ambulatory Visit (HOSPITAL_BASED_OUTPATIENT_CLINIC_OR_DEPARTMENT_OTHER)
Admission: RE | Admit: 2015-10-30 | Discharge: 2015-10-30 | Disposition: A | Discharge status: Home / Self Care | Payer: BLUE CROSS/BLUE SHIELD | Source: Ambulatory Visit | Attending: Family | Admitting: Family

## 2015-10-30 DIAGNOSIS — Z1231 Encounter for screening mammogram for malignant neoplasm of breast: Secondary | ICD-10-CM | POA: Diagnosis not present

## 2015-11-18 ENCOUNTER — Other Ambulatory Visit: Payer: Self-pay | Admitting: Family

## 2015-11-18 NOTE — Telephone Encounter (Addendum)
Last seen 09/11/15 Last filled #60-0 rf, 10/16/15 UDS 08/11/15-- not detected  Written 1 tab 2 times daily. Please advise    PC

## 2015-11-19 MED ORDER — ALPRAZOLAM 0.5 MG PO TABS: 0.5000 mg | ORAL_TABLET | Freq: Two times a day (BID) | ORAL | 0 refills | Status: DC

## 2015-11-19 NOTE — Telephone Encounter (Signed)
Ok to send 60 tabs zero refills. 

## 2015-11-19 NOTE — Addendum Note (Signed)
Addended by: Magdalene Molly A on: 11/19/2015 01:39 PM   Modules accepted: Orders

## 2015-11-19 NOTE — Telephone Encounter (Signed)
Rx has been printed awaiting Melissa sig.  Pc

## 2015-11-20 NOTE — Telephone Encounter (Signed)
Rx faxed to pharmacy  

## 2015-11-25 ENCOUNTER — Other Ambulatory Visit: Payer: Self-pay | Admitting: Family

## 2015-11-25 NOTE — Telephone Encounter (Signed)
Pt's last OV was 08/2015 and pt has no future appts scheduled. Please advise when pt should follow up with PCP?

## 2015-11-27 NOTE — Telephone Encounter (Signed)
[name], NP  You 23 hours ago (1:49 PM)    6 month follow up please. (Routing comment)

## 2015-12-09 ENCOUNTER — Ambulatory Visit: Payer: Self-pay | Admitting: Orthopedic Surgery

## 2015-12-09 NOTE — Telephone Encounter (Signed)
Pt has been scheduled.  °

## 2015-12-15 ENCOUNTER — Other Ambulatory Visit: Payer: Self-pay | Admitting: Family

## 2015-12-16 ENCOUNTER — Encounter: Payer: Self-pay | Admitting: Family

## 2015-12-16 ENCOUNTER — Ambulatory Visit (INDEPENDENT_AMBULATORY_CARE_PROVIDER_SITE_OTHER): Payer: BLUE CROSS/BLUE SHIELD | Admitting: Family

## 2015-12-16 ENCOUNTER — Ambulatory Visit: Payer: BLUE CROSS/BLUE SHIELD | Admitting: Family

## 2015-12-16 VITALS — BP 123/67 | HR 88 | Temp 98.4°F | Resp 18 | Ht 65.0 in | Wt 242.6 lb

## 2015-12-16 DIAGNOSIS — Z0000 Encounter for general adult medical examination without abnormal findings: Secondary | ICD-10-CM | POA: Diagnosis not present

## 2015-12-16 DIAGNOSIS — D509 Iron deficiency anemia, unspecified: Secondary | ICD-10-CM | POA: Diagnosis not present

## 2015-12-16 DIAGNOSIS — E559 Vitamin D deficiency, unspecified: Secondary | ICD-10-CM

## 2015-12-16 LAB — BASIC METABOLIC PANEL
BUN: 20 mg/dL (ref 6–23)
CHLORIDE: 107 meq/L (ref 96–112)
CO2: 25 meq/L (ref 19–32)
Calcium: 8.9 mg/dL (ref 8.4–10.5)
Creatinine, Ser: 0.47 mg/dL (ref 0.40–1.20)
GFR: 143.84 mL/min (ref >60.00)
Glucose, Bld: 101 mg/dL — ABNORMAL HIGH (ref 70–99)
POTASSIUM: 3.9 meq/L (ref 3.5–5.1)
SODIUM: 139 meq/L (ref 135–145)

## 2015-12-16 LAB — CBC WITH DIFFERENTIAL/PLATELET
BASOS ABS: 0 10*3/uL (ref 0.0–0.1)
BASOS PCT: 0.6 % (ref 0.0–3.0)
EOS PCT: 3.4 % (ref 0.0–5.0)
Eosinophils Absolute: 0.2 10*3/uL (ref 0.0–0.7)
HEMATOCRIT: 37.3 % (ref 36.0–46.0)
Hemoglobin: 12.3 g/dL (ref 12.0–15.0)
LYMPHS PCT: 27.3 % (ref 12.0–46.0)
Lymphs Abs: 1.9 10*3/uL (ref 0.7–4.0)
MCHC: 32.9 g/dL (ref 30.0–36.0)
MCV: 86.3 fl (ref 78.0–100.0)
MONOS PCT: 5.6 % (ref 3.0–12.0)
Monocytes Absolute: 0.4 10*3/uL (ref 0.1–1.0)
NEUTROS ABS: 4.4 10*3/uL (ref 1.4–7.7)
Neutrophils Relative %: 63.1 % (ref 43.0–77.0)
PLATELETS: 309 10*3/uL (ref 150.0–400.0)
RBC: 4.32 Mil/uL (ref 3.87–5.11)
RDW: 13.9 % (ref 11.5–15.5)
WBC: 7 10*3/uL (ref 4.0–10.5)

## 2015-12-16 LAB — URINALYSIS, ROUTINE W REFLEX MICROSCOPIC
BILIRUBIN URINE: NEGATIVE
HGB URINE DIPSTICK: NEGATIVE
KETONES UR: NEGATIVE
Leukocytes, UA: NEGATIVE
Nitrite: NEGATIVE
PH: 5.5 (ref 5.0–8.0)
Specific Gravity, Urine: 1.025 (ref 1.000–1.030)
TOTAL PROTEIN, URINE-UPE24: NEGATIVE
Urine Glucose: NEGATIVE
Urobilinogen, UA: 0.2 (ref 0.0–1.0)

## 2015-12-16 LAB — LIPID PANEL
Cholesterol: 147 mg/dL (ref 0–200)
HDL: 50.6 mg/dL (ref >39.00)
LDL Cholesterol: 77 mg/dL (ref 0–99)
NONHDL: 96.22
Total CHOL/HDL Ratio: 3
Triglycerides: 96 mg/dL (ref 0.0–149.0)
VLDL: 19.2 mg/dL (ref 0.0–40.0)

## 2015-12-16 LAB — HEPATIC FUNCTION PANEL
ALBUMIN: 4 g/dL (ref 3.5–5.2)
ALK PHOS: 80 U/L (ref 39–117)
ALT: 15 U/L (ref 0–35)
AST: 16 U/L (ref 0–37)
Bilirubin, Direct: 0.1 mg/dL (ref 0.0–0.3)
TOTAL PROTEIN: 6.9 g/dL (ref 6.0–8.3)
Total Bilirubin: 0.4 mg/dL (ref 0.2–1.2)

## 2015-12-16 LAB — TSH: TSH: 0.61 u[IU]/mL (ref 0.35–4.50)

## 2015-12-16 LAB — VITAMIN D 25 HYDROXY (VIT D DEFICIENCY, FRACTURES): VITD: 52.88 ng/mL (ref 30.00–100.00)

## 2015-12-16 LAB — IRON: IRON: 47 ug/dL (ref 42–145)

## 2015-12-16 NOTE — Patient Instructions (Addendum)
Continue to work on diet and weight loss. Complete lab work prior to leaving.

## 2015-12-16 NOTE — Progress Notes (Signed)
Pre visit review using our clinic review tool, if applicable. No additional management support is needed unless otherwise documented below in the visit note. 

## 2015-12-16 NOTE — Progress Notes (Signed)
Subjective:    Patient ID: [name] [name], female    DOB: 1955-06-06, 60 y.o.   MRN: [id number]  HPI  Patient presents today for complete physical.  Immunizations: up to date  Diet: reports that her diet is fair.   Exercise: not exercising.  She will have knee replacement.   Colonoscopy: 2008- due 2018 Dexa: 9/16 Pap Smear: 5/15- normal Mammogram: 9/17 Dental: up to date Vision: has an appointment scheduled  Wt Readings from Last 3 Encounters:  12/16/15 242 lb 9.6 oz (110 kg)  09/11/15 243 lb 3.2 oz (110.3 kg)  07/01/15 247 lb 3.2 oz (112.1 kg)       Review of Systems  Constitutional: Negative for unexpected weight change.  HENT: Positive for rhinorrhea. Negative for hearing loss.   Eyes: Negative for visual disturbance.  Respiratory: Negative for cough.   Cardiovascular:       Some arthritis swelling in her feet R>L  Gastrointestinal: Negative for constipation and diarrhea.  Genitourinary: Negative for frequency.       Reports hx of cystitis, uses myrbetriq- she sees Dr. Diona Fanti  Musculoskeletal: Positive for arthralgias.  Neurological: Negative for headaches.  Hematological: Negative for adenopathy.  Psychiatric/Behavioral:       Denies depression. Reports chronic anxiety which is stable with xanax and paxil.  Feels like pain (knee/foot) makes her anxiety worse       Past Medical History:  Diagnosis Date  . Allergy   . Anemia    takes iron supplement  . Anxiety   . Arthritis   . 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 Elevated alkaline phosphatase level    negative workup (presumed secondary to fatty liver)  . Frequency-urgency syndrome   . GERD (gastroesophageal reflux disease)   . Interstitial cystitis    Dr Vernie Shanks  . Neuromuscular disorder (HCC)    rt arm carpal tunnel syndrome  . Obesity    status post bariatric procedure in [facility] 2005  . Osteoporosis   . Thyroid nodule 7/10   `  . Thyroid nodule   . Varicose vein of leg      Social  History   Social History  . Marital status: Divorced    Spouse name: N/A  . Number of children: 1  . Years of education: N/A   Occupational History  . [employer]   Social History Main Topics  . Smoking status: Never Smoker  . Smokeless tobacco: Never Used  . Alcohol use Yes     Comment: RARE  . Drug use: No  . Sexual activity: Yes   Other Topics Concern  . Not on file   Social History Narrative  . No narrative on file    Past Surgical History:  Procedure Laterality Date  . ANTERIOR CERVICAL DECOMP/DISCECTOMY FUSION N/A 06/21/2012   Procedure: ANTERIOR CERVICAL DECOMPRESSION/DISCECTOMY FUSION C-4 - C5 (SPACER/DePUY CERVICAL PLATE ONLY) 1 LEVEL;  Surgeon: Melina Schools, MD;  Location: [place];  Service: Orthopedics;  Laterality: N/A;  . BLADDER SURGERY  02/21/12 and 02/28/12   Dr Diona Fanti  . COLPOSCOPY  1980  . CRYOTHERAPY  1980  . CYSTO WITH HYDRODISTENSION  12/12/2011   Procedure: CYSTOSCOPY/HYDRODISTENSION;  Surgeon: Franchot Gallo, MD;  Location: [facility];  Service: Urology;  Laterality: N/A;  30 MIN   . EYE SURGERY  1997   bilateral cataract extraction  . FOOT SURGERY     bilateral bunionettes  . FOOT SURGERY Right 01/07/14   Pt  states surgery was due to arthritis. "Has pins in place now"  . GASTRIC BYPASS  2005  . JOINT REPLACEMENT  2008   left total knee  . KNEE SURGERY  1997   left knee  . NASAL SINUS SURGERY  123XX123   Dr Erik Obey  . REFRACTIVE SURGERY  2006   lasik  . SHOULDER SURGERY  2 2012   RIGHT   . STOMACH SURGERY  2007   "tummy tuck"  . VARICOSE VEIN SURGERY Right 02/2012   leg    Family History  Problem Relation Age of Onset  . Diabetes Paternal Grandfather   . Stroke Paternal Grandfather   . Hyperlipidemia Mother   . Hypertension Mother   . Hypertension Father   . Diabetes Father   . Prostate cancer Father   . Dementia Father   . Heart disease Maternal Grandmother   . Hypertension Maternal  Grandmother   . Heart disease Maternal Grandfather   . Hypertension Maternal Grandfather   . Stroke Maternal Grandfather     Allergies  Allergen Reactions  . 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  No allergy.  . Nitrofurantoin     REACTION: fever  . Prednisone     REACTION: palpitations.  06/1715  Pt states she can now tolerate prednisone.    Current Outpatient Prescriptions on File Prior to Visit  Medication Sig Dispense Refill  . ALPRAZolam (XANAX) 0.5 MG tablet Take 1 tablet (0.5 mg total) by mouth 2 (two) times daily. 60 tablet 0  . amitriptyline (ELAVIL) 50 MG tablet TK 1 T PO QD  3  . BIOTIN PO Take 1 tablet by mouth daily.    . calcium elemental as carbonate (TUMS ULTRA 1000) 400 MG tablet Chew 1,000 mg by mouth 2 (two) times daily.      . ferrous sulfate 325 (65 FE) MG tablet Take 1 tablet (325 mg total) by mouth 2 (two) times daily. 30 tablet 5  . fexofenadine (ALLEGRA) 180 MG tablet Take 180 mg by mouth daily as needed for allergies or rhinitis.    . Multiple Vitamin (MULTIVITAMIN) tablet Take 1 tablet by mouth 2 (two) times daily.      Marland Kitchen MYRBETRIQ 50 MG TB24 tablet Take 50 mg by mouth daily.  2  . Omega-3 Fatty Acids (FISH OIL) 1000 MG CAPS Take 1 capsule by mouth 2 (two) times daily.      Marland Kitchen omeprazole (PRILOSEC) 40 MG capsule TAKE 1 CAPSULE (40 MG TOTAL) BY MOUTH DAILY. 90 capsule 1  . PARoxetine (PAXIL) 30 MG tablet TAKE 1 TABLET BY MOUTH EVERY MORNING 90 tablet 0  . phenazopyridine (PYRIDIUM) 200 MG tablet Take 200 mg by mouth daily as needed.  11  . valACYclovir (VALTREX) 500 MG tablet TAKE 1 TABLET BY MOUTH EVERY DAY. NEEDS APPT 30 tablet 0  . VITAMIN A PO Take 1 tablet by mouth daily.     . Vitamin D, Ergocalciferol, (DRISDOL) 50000 units CAPS capsule TAKE 1 CAPSULE (50,000 UNITS TOTAL) BY MOUTH 2 (TWO) TIMES A WEEK. 24 capsule 0  . XIIDRA 5 % SOLN INSTILL 1 DROP IN BOTH EYES BID  0   No current facility-administered medications  on file prior to visit.     BP 123/67 (BP Location: Left Arm, Cuff Size: Large)   Pulse 88   Temp 98.4 F (36.9 C) (Oral)   Resp 18   Ht 5\' 5"  (1.651 m)   Wt 242  lb 9.6 oz (110 kg)   SpO2 97% Comment: room air  BMI 40.37 kg/m    Objective:   Physical Exam  Physical Exam  Constitutional: Morbidly obese white female. She is oriented to person, place, and time. She appears well-developed and well-nourished. No distress.  HENT:  Head: Normocephalic and atraumatic.  Right Ear: Tympanic membrane and ear canal normal.  Left Ear: Tympanic membrane and ear canal normal.  Mouth/Throat: Oropharynx is clear and moist.  Eyes: Pupils are equal, round, and reactive to light. No scleral icterus.  Neck: Normal range of motion. No thyromegaly present.  Cardiovascular: Normal rate and regular rhythm.   No murmur heard. Pulmonary/Chest: Effort normal and breath sounds normal. No respiratory distress. He has no wheezes. She has no rales. She exhibits no tenderness.  Abdominal: Soft. Bowel sounds are normal. She exhibits no distension and no mass. There is no tenderness. There is no rebound and no guarding.  Musculoskeletal: She exhibits no edema.  Lymphadenopathy:    She has no cervical adenopathy.  Neurological: She is alert and oriented to person, place, and time. She has normal patellar reflexes. She exhibits normal muscle tone. Coordination normal.  Skin: Skin is warm and dry.  Psychiatric: She has a normal mood and affect. Her behavior is normal. Judgment and thought content normal.  Breasts: Examined lying Right: Without masses, retractions, discharge or axillary adenopathy.  Left: Without masses, retractions, discharge or axillary adenopathy.  Pelvic: deferred          Assessment & Plan:        Assessment & Plan:  Preventative Care- discussed healthy diet, exercise and weight loss. EKG tracing is personally reviewed.  EKG notes NSR- RBBB (old) No acute changes.

## 2015-12-17 ENCOUNTER — Encounter: Payer: Self-pay | Admitting: Family

## 2015-12-22 NOTE — Progress Notes (Signed)
12/16/2015- noted in EPIC-EKG, labs- CBC w/diff., BMET, Hepatic function, Lipid panel,TSH,U/A, Vit. D, Iron

## 2015-12-22 NOTE — Patient Instructions (Addendum)
MARQUITIA PHAUP  12/22/2015   Your procedure is scheduled on: Thursday 12/31/2015  Report to [facility] Main  Entrance take [place]  elevators to 3rd floor to  [place] at   [place]  AM.  Call this number if you have problems the morning of surgery [phone]   Remember: ONLY 1 PERSON MAY GO WITH YOU TO SHORT STAY TO GET  READY MORNING OF [place].   Do not eat food or drink liquids :After Midnight.     Take these medicines the morning of surgery with A SIP OF WATER: Astelin nasal spray, may use Restasis if needed,  May have Hydrocodone if needed                                 You may not have any metal on your body including hair pins and              piercings  Do not wear jewelry, make-up, lotions, powders or perfumes, deodorant             Do not wear nail polish.  Do not shave  48 hours prior to surgery.              Men may shave face and neck.   Do not bring valuables to the hospital. [place].  Contacts, dentures or bridgework may not be worn into surgery.  Leave suitcase in the car. After surgery it may be brought to your room.                  Please read over the following fact sheets you were given: _____________________________________________________________________             [facility] - Preparing for Surgery Before surgery, you can play an important role.  Because skin is not sterile, your skin needs to be as free of germs as possible.  You can reduce the number of germs on your skin by washing with CHG (chlorahexidine gluconate) soap before surgery.  CHG is an antiseptic cleaner which kills germs and bonds with the skin to continue killing germs even after washing. Please DO NOT use if you have an allergy to CHG or antibacterial soaps.  If your skin becomes reddened/irritated stop using the CHG and inform your nurse when you arrive at Short Stay. Do not shave (including  legs and underarms) for at least 48 hours prior to the first CHG shower.  You may shave your face/neck. Please follow these instructions carefully:  1.  Shower with CHG Soap the night before surgery and the  morning of Surgery.  2.  If you choose to wash your hair, wash your hair first as usual with your  normal  shampoo.  3.  After you shampoo, rinse your hair and body thoroughly to remove the  shampoo.                           4.  Use CHG as you would any other liquid soap.  You can apply chg directly  to the skin and wash  Gently with a scrungie or clean washcloth.  5.  Apply the CHG Soap to your body ONLY FROM THE NECK DOWN.   Do not use on face/ open                           Wound or open sores. Avoid contact with eyes, ears mouth and genitals (private parts).                       Wash face,  Genitals (private parts) with your normal soap.             6.  Wash thoroughly, paying special attention to the area where your surgery  will be performed.  7.  Thoroughly rinse your body with warm water from the neck down.  8.  DO NOT shower/wash with your normal soap after using and rinsing off  the CHG Soap.                9.  Pat yourself dry with a clean towel.            10.  Wear clean pajamas.            11.  Place clean sheets on your bed the night of your first shower and do not  sleep with pets. Day of Surgery : Do not apply any lotions/deodorants the morning of surgery.  Please wear clean clothes to the hospital/surgery center.  FAILURE TO FOLLOW THESE INSTRUCTIONS MAY RESULT IN THE CANCELLATION OF YOUR SURGERY PATIENT SIGNATURE_________________________________  NURSE SIGNATURE__________________________________  ________________________________________________________________________   Adam Phenix  An incentive spirometer is a tool that can help keep your lungs clear and active. This tool measures how well you are filling your lungs with each breath.  Taking long deep breaths may help reverse or decrease the chance of developing breathing (pulmonary) problems (especially infection) following:  A long period of time when you are unable to move or be active. BEFORE THE PROCEDURE   If the spirometer includes an indicator to show your best effort, your nurse or respiratory therapist will set it to a desired goal.  If possible, sit up straight or lean slightly forward. Try not to slouch.  Hold the incentive spirometer in an upright position. INSTRUCTIONS FOR USE  1. Sit on the edge of your bed if possible, or sit up as far as you can in bed or on a chair. 2. Hold the incentive spirometer in an upright position. 3. Breathe out normally. 4. Place the mouthpiece in your mouth and seal your lips tightly around it. 5. Breathe in slowly and as deeply as possible, raising the piston or the ball toward the top of the column. 6. Hold your breath for 3-5 seconds or for as long as possible. Allow the piston or ball to fall to the bottom of the column. 7. Remove the mouthpiece from your mouth and breathe out normally. 8. Rest for a few seconds and repeat Steps 1 through 7 at least 10 times every 1-2 hours when you are awake. Take your time and take a few normal breaths between deep breaths. 9. The spirometer may include an indicator to show your best effort. Use the indicator as a goal to work toward during each repetition. 10. After each set of 10 deep breaths, practice coughing to be sure your lungs are clear. If you have an incision (the cut made at the time of surgery),  support your incision when coughing by placing a pillow or rolled up towels firmly against it. Once you are able to get out of bed, walk around indoors and cough well. You may stop using the incentive spirometer when instructed by your caregiver.  RISKS AND COMPLICATIONS  Take your time so you do not get dizzy or light-headed.  If you are in pain, you may need to take or ask for pain  medication before doing incentive spirometry. It is harder to take a deep breath if you are having pain. AFTER USE  Rest and breathe slowly and easily.  It can be helpful to keep track of a log of your progress. Your caregiver can provide you with a simple table to help with this. If you are using the spirometer at home, follow these instructions: [place] IF:   You are having difficultly using the spirometer.  You have trouble using the spirometer as often as instructed.  Your pain medication is not giving enough relief while using the spirometer.  You develop fever of 100.5 F (38.1 C) or higher. SEEK IMMEDIATE MEDICAL CARE IF:   You cough up bloody sputum that had not been present before.  You develop fever of 102 F (38.9 C) or greater.  You develop worsening pain at or near the incision site. MAKE SURE YOU:   Understand these instructions.  Will watch your condition.  Will get help right away if you are not doing well or get worse. Document Released: 06/13/2006 Document Revised: 04/25/2011 Document Reviewed: 08/14/2006 ExitCare Patient Information 2014 ExitCare, [place].   ________________________________________________________________________  WHAT IS A BLOOD TRANSFUSION? Blood Transfusion Information  A transfusion is the replacement of blood or some of its parts. Blood is made up of multiple cells which provide different functions.  Red blood cells carry oxygen and are used for blood loss replacement.  White blood cells fight against infection.  Platelets control bleeding.  Plasma helps clot blood.  Other blood products are available for specialized needs, such as hemophilia or other clotting disorders. BEFORE THE TRANSFUSION  Who gives blood for transfusions?   Healthy volunteers who are fully evaluated to make sure their blood is safe. This is blood bank blood. Transfusion therapy is the safest it has ever been in the practice of medicine.  Before blood is taken from a donor, a complete history is taken to make sure that person has no history of diseases nor engages in risky social behavior (examples are intravenous drug use or sexual activity with multiple partners). The donor's travel history is screened to minimize risk of transmitting infections, such as malaria. The donated blood is tested for signs of infectious diseases, such as HIV and hepatitis. The blood is then tested to be sure it is compatible with you in order to minimize the chance of a transfusion reaction. If you or a relative donates blood, this is often done in anticipation of surgery and is not appropriate for emergency situations. It takes many days to process the donated blood. RISKS AND COMPLICATIONS Although transfusion therapy is very safe and saves many lives, the main dangers of transfusion include:   Getting an infectious disease.  Developing a transfusion reaction. This is an allergic reaction to something in the blood you were given. Every precaution is taken to prevent this. The decision to have a blood transfusion has been considered carefully by your caregiver before blood is given. Blood is not given unless the benefits outweigh the risks. AFTER THE TRANSFUSION  Right after receiving a blood transfusion, you will usually feel much better and more energetic. This is especially true if your red blood cells have gotten low (anemic). The transfusion raises the level of the red blood cells which carry oxygen, and this usually causes an energy increase.  The nurse administering the transfusion will monitor you carefully for complications. HOME CARE INSTRUCTIONS  No special instructions are needed after a transfusion. You may find your energy is better. Speak with your caregiver about any limitations on activity for underlying diseases you may have. SEEK MEDICAL CARE IF:   Your condition is not improving after your transfusion.  You develop redness or  irritation at the intravenous (IV) site. SEEK IMMEDIATE MEDICAL CARE IF:  Any of the following symptoms occur over the next 12 hours:  Shaking chills.  You have a temperature by mouth above 102 F (38.9 C), not controlled by medicine.  Chest, back, or muscle pain.  People around you feel you are not acting correctly or are confused.  Shortness of breath or difficulty breathing.  Dizziness and fainting.  You get a rash or develop hives.  You have a decrease in urine output.  Your urine turns a dark color or changes to pink, red, or brown. Any of the following symptoms occur over the next 10 days:  You have a temperature by mouth above 102 F (38.9 C), not controlled by medicine.  Shortness of breath.  Weakness after normal activity.  The white part of the eye turns yellow (jaundice).  You have a decrease in the amount of urine or are urinating less often.  Your urine turns a dark color or changes to pink, red, or brown. Document Released: 01/29/2000 Document Revised: 04/25/2011 Document Reviewed: 09/17/2007 [facility] Patient Information 2014 [place], [place].  _______________________________________________________________________

## 2015-12-23 ENCOUNTER — Inpatient Hospital Stay (HOSPITAL_COMMUNITY): Admission: RE | Admit: 2015-12-23 | Payer: BLUE CROSS/BLUE SHIELD | Source: Ambulatory Visit

## 2015-12-23 ENCOUNTER — Encounter (HOSPITAL_COMMUNITY)
Admission: RE | Admit: 2015-12-23 | Discharge: 2015-12-23 | Disposition: A | Discharge status: Home / Self Care | Payer: BLUE CROSS/BLUE SHIELD | Source: Ambulatory Visit | Attending: Specialist | Admitting: Specialist

## 2015-12-23 ENCOUNTER — Encounter (HOSPITAL_COMMUNITY): Payer: Self-pay

## 2015-12-23 DIAGNOSIS — M1711 Unilateral primary osteoarthritis, right knee: Secondary | ICD-10-CM | POA: Insufficient documentation

## 2015-12-23 DIAGNOSIS — Z01812 Encounter for preprocedural laboratory examination: Secondary | ICD-10-CM | POA: Diagnosis not present

## 2015-12-23 LAB — URINALYSIS, ROUTINE W REFLEX MICROSCOPIC
Bilirubin Urine: NEGATIVE
GLUCOSE, UA: NEGATIVE mg/dL
HGB URINE DIPSTICK: NEGATIVE
KETONES UR: NEGATIVE mg/dL
Leukocytes, UA: NEGATIVE
Nitrite: NEGATIVE
PROTEIN: NEGATIVE mg/dL
Specific Gravity, Urine: 1.009 (ref 1.005–1.030)
pH: 6 (ref 5.0–8.0)

## 2015-12-23 LAB — SURGICAL PCR SCREEN
MRSA, PCR: NEGATIVE
STAPHYLOCOCCUS AUREUS: POSITIVE — AB

## 2015-12-23 LAB — PROTIME-INR
INR: 1
Prothrombin Time: 13.2 seconds (ref 11.4–15.2)

## 2015-12-23 LAB — APTT: aPTT: 26 seconds (ref 24–36)

## 2015-12-24 ENCOUNTER — Ambulatory Visit: Payer: Self-pay | Admitting: Orthopedic Surgery

## 2015-12-24 ENCOUNTER — Other Ambulatory Visit: Payer: Self-pay | Admitting: Family

## 2015-12-25 ENCOUNTER — Ambulatory Visit: Payer: Self-pay | Admitting: Orthopedic Surgery

## 2015-12-25 MED ORDER — ALPRAZOLAM 0.5 MG PO TABS: 0.5000 mg | ORAL_TABLET | Freq: Two times a day (BID) | ORAL | 0 refills | Status: DC

## 2015-12-25 NOTE — Addendum Note (Signed)
Addended by: Magdalene Molly A on: 12/25/2015 03:56 PM   Modules accepted: Orders

## 2015-12-25 NOTE — H&P (Signed)
[name] [name] DOB: 04/22/1955 Single / Language: [name] [name] / Race: White Female  H&P Date: 12/25/15  Chief Complaint: R knee pain  History of Present Illness The patient is a 60 year old female who comes in today for a preoperative History and Physical. The patient is scheduled for a right total knee arthroplasty to be performed by Dr. Johnn Hai, MD at [facility] on December 31, 2015. Sumayyah reports chronic pain R knee for years, progressively worsening, refractory to conservative tx including both steroid and viscosupplementation injections, pain medications, bracing, activity modifications, quad strengthening, relative rest. Her pain is now interfering with ADL's and quality of life. She would like to proceed with total knee replacement on the right.  Dr. Tonita Cong and the patient mutually agreed to proceed with a R total knee replacement. Risks and benefits of the procedure were discussed including stiffness, suboptimal range of motion, persistent pain, infection requiring removal of prosthesis and reinsertion, need for prophylactic antibiotics in the future, for example, dental procedures, possible need for manipulation, revision in the future and also anesthetic complications including DVT, PE, etc. We discussed the perioperative course, time in the hospital, postoperative recovery and the need for elevation to control swelling. We also discussed the predicted range of motion and the probability that squatting and kneeling would be unobtainable in the future. In addition, postoperative anticoagulation was discussed. We have obtained preoperative medical clearance as necessary. Provided her illustrated handout and discussed it in detail. They will enroll in the total joint replacement educational forum at the hospital.  Pre-op appt was earlier this week. 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 Allergies Reconciled   Family History  Cerebrovascular Accident  grandfather mothers side Congestive Heart Failure  grandfather mothers side Heart Disease  grandfather mothers side Chronic Obstructive Lung Disease  father First Degree Relatives  Depression  child Heart disease in female family member before age 70  Hypertension  grandmother mothers side and grandfather mothers side Diabetes Mellitus  grandfather fathers side Cancer  First Degree Relatives. father and brother  Social History Tobacco use  Never smoker. 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 Ferrous Sulfate (325 (65 Fe)MG Tablet, Oral) Active. Allegra Allergy (60MG  Tablet, Oral) Active. Paxil (30MG  Tablet, Oral) Active. Myrbetriq (Oral) Specific strength unknown - Active. ALPRAZolam (0.5MG  Tablet, Oral) Active.  (PRN) Amitriptyline HCl (50MG  Tablet, Oral)  Active. Astepro (0.15% Solution, Nasal) Active. Omeprazole (40MG  Capsule DR, Oral) Active. Vitamin D2 (Oral) Specific strength unknown - Active. Ketotifen Fumarate (0.025% Solution, Ophthalmic) Active. Medications Reconciled  Pregnancy / Birth History Pregnant  no  Past Surgical History  Cataract Surgery  bilateral Arthroscopy of Shoulder  right Foot Surgery  bilateral Sinus Surgery  Other Surgery  Mini Gastric Bypass, Tummy Tuck and Arm Tuck Total Knee Replacement  left Arthroscopy of Knee  left Spinal Fusion - Neck   Review of Systems General Not Present- Chills, Fatigue, Fever, Memory Loss, Night Sweats, Weight Gain and Weight Loss. Skin Not Present- Eczema, Hives, Itching, Lesions and Rash. HEENT Not Present- Dentures, Double Vision, Headache, Hearing Loss, Tinnitus and Visual Loss. Respiratory Present- Allergies. Not Present- Chronic Cough, Coughing up blood, Shortness of breath at rest and Shortness of breath with exertion. Cardiovascular Not Present- Chest Pain, Difficulty Breathing Lying Down, Murmur, Palpitations, Racing/skipping heartbeats and Swelling. Gastrointestinal Not Present- Abdominal Pain, Bloody Stool, Constipation, Diarrhea, Difficulty Swallowing, Heartburn, Jaundice, Loss of appetitie, Nausea and Vomiting. Female Genitourinary Present- Incontinence, Urinary frequency, Urinating at Night and Weak urinary stream. Not Present- Blood in Urine, Discharge, Flank Pain, Painful Urination, Urgency and Urinary Retention. Musculoskeletal Present- Joint Pain, Joint Swelling, Morning Stiffness and Muscle Weakness. Not Present- Back Pain, Muscle Pain and Spasms. Neurological Not Present- Blackout spells, Difficulty with balance, Dizziness, Paralysis, Tremor and Weakness. Psychiatric Not Present- Insomnia.  Physical Exam General Mental Status -Alert, cooperative and good historian. General Appearance-pleasant,  Not in acute distress. Orientation-Oriented X3. Build & Nutrition-Well nourished and Well developed.  Head and Neck Head-normocephalic, atraumatic . Neck Global Assessment - supple, no bruit auscultated on the right, no bruit auscultated on the left.  Eye Pupil - Bilateral-Regular and Round. Motion - Bilateral-EOMI.  Chest and Lung Exam Auscultation Breath sounds - clear at anterior chest wall and clear at posterior chest wall. Adventitious sounds - No Adventitious sounds.  Cardiovascular Auscultation Rhythm - Regular rate and rhythm. Heart Sounds - S1 WNL and S2 WNL. Murmurs & Other Heart Sounds - Auscultation of the heart reveals - No Murmurs.  Abdomen Palpation/Percussion Tenderness - Abdomen is non-tender to palpation. Rigidity (guarding) - Abdomen is soft. Auscultation Auscultation of the abdomen reveals - Bowel sounds normal.  Female Genitourinary Not done, not pertinent to present illness  Musculoskeletal She is obese. She has an antalgic gait. Right knee tender to palpation medial joint line, nontender lateral joint line, patella, patellar tendon, quad tendon, fibular head, peroneal nerve, popliteal space. No calf pain or sign of DVT. No pain or laxity with varus or valgus stress. Range of motion is approximately 0 to 100 degrees, has an antalgic gait.  Imaging X-rays reviewed by Dr. Tonita Cong left total knee in excellent alignment with no signs of osteolysis or loosening. Right knee bone-on-bone medial joint space, varus deformity, even with some bony erosion noted there at the medial joint space.  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 Tonita Cong on 12/31/15. We again discussed the procedure itself as well as risks, complications and alternatives, including but not limited to DVT, PE, infx, bleeding, failure  of procedure, need for secondary procedure including manipulation, nerve injury, ongoing pain/symptoms, anesthesia risk, even stroke or death. Also discussed typical post-op protocols, activity restrictions, need for PT, flexion/extension exercises, time out of work. Discussed need for DVT ppx post-op with Xarelto then ASA  per protocol. Discussed dental ppx. Also discussed limitations post-operatively such as kneeling and squatting. All questions were answered. Patient desires to proceed with surgery as scheduled. Will hold ASA, supplements and NSAIDs accordingly. Will remain NPO after MN night before surgery. Will present to [facility] for pre-op testing. Plan Xarelto 2 weeks post-op for DVT ppx then ASA. Plan Percocet plus benadryl prn, Robaxin, Colace, Miralax. Plan home with HHPT post-op with family members at home for assistance. Will follow up 10-14 days post-op for staple removal and xrays.  Signed electronically by Cecilie Kicks, PA-C for Dr. Tonita Cong

## 2015-12-25 NOTE — Telephone Encounter (Signed)
rx faxed   pc 

## 2015-12-25 NOTE — Telephone Encounter (Signed)
Filling in for PCP  Last seen 12/16/15 Last filled #[id number] 10/5/7 Sig:  Take 1 tablet (0.5 mg total) by mouth 2 (two) times daily.  Patient taking differently: Take 0.5-1 mg by mouth at bedtime as needed for sleep. Depends on if she can get to sleep   Please advise PC

## 2015-12-25 NOTE — Addendum Note (Signed)
Addended by: Magdalene Molly A on: 12/25/2015 04:02 PM   Modules accepted: Orders

## 2015-12-28 ENCOUNTER — Telehealth: Payer: Self-pay | Admitting: Family

## 2015-12-28 ENCOUNTER — Ambulatory Visit: Payer: Self-pay | Admitting: Orthopedic Surgery

## 2015-12-28 ENCOUNTER — Other Ambulatory Visit: Payer: Self-pay | Admitting: Family

## 2015-12-28 NOTE — Telephone Encounter (Signed)
Caller name: Relationship to patient: Self Can be reached: [phone] Pharmacy:  [id number] - [place], [place] [phone] (Phone) [phone] (Fax)     Reason for call: Refill on ALPRAZolam Duanne Moron) 0.5 MG tablet [id number]

## 2015-12-29 NOTE — Telephone Encounter (Signed)
Rx was faxed to [place] in error. Cancelled Rx per Suezanne Jacquet at [facility] and called Rx to [facility] at [place]. Notified pt.

## 2015-12-31 ENCOUNTER — Inpatient Hospital Stay (HOSPITAL_COMMUNITY)
Admission: RE | Admit: 2015-12-31 | Discharge: 2016-01-03 | DRG: 470 | Disposition: A | Discharge status: Home Health | Payer: BLUE CROSS/BLUE SHIELD | Source: Ambulatory Visit | Attending: Specialist | Admitting: Specialist

## 2015-12-31 ENCOUNTER — Encounter (HOSPITAL_COMMUNITY)
Admission: RE | Disposition: A | Discharge status: Home Health | Payer: Self-pay | Source: Ambulatory Visit | Attending: Specialist

## 2015-12-31 ENCOUNTER — Inpatient Hospital Stay (HOSPITAL_COMMUNITY): Payer: BLUE CROSS/BLUE SHIELD | Admitting: Registered Nurse

## 2015-12-31 ENCOUNTER — Ambulatory Visit (HOSPITAL_COMMUNITY): Payer: BLUE CROSS/BLUE SHIELD

## 2015-12-31 ENCOUNTER — Encounter (HOSPITAL_COMMUNITY): Payer: Self-pay | Admitting: *Deleted

## 2015-12-31 DIAGNOSIS — Z8249 Family history of ischemic heart disease and other diseases of the circulatory system: Secondary | ICD-10-CM

## 2015-12-31 DIAGNOSIS — Z6841 Body Mass Index (BMI) 40.0 and over, adult: Secondary | ICD-10-CM

## 2015-12-31 DIAGNOSIS — I10 Essential (primary) hypertension: Secondary | ICD-10-CM | POA: Diagnosis present

## 2015-12-31 DIAGNOSIS — F419 Anxiety disorder, unspecified: Secondary | ICD-10-CM | POA: Diagnosis present

## 2015-12-31 DIAGNOSIS — Z833 Family history of diabetes mellitus: Secondary | ICD-10-CM | POA: Diagnosis not present

## 2015-12-31 DIAGNOSIS — Z885 Allergy status to narcotic agent status: Secondary | ICD-10-CM

## 2015-12-31 DIAGNOSIS — Z79899 Other long term (current) drug therapy: Secondary | ICD-10-CM

## 2015-12-31 DIAGNOSIS — N3020 Other chronic cystitis without hematuria: Secondary | ICD-10-CM | POA: Diagnosis present

## 2015-12-31 DIAGNOSIS — M1711 Unilateral primary osteoarthritis, right knee: Secondary | ICD-10-CM | POA: Diagnosis present

## 2015-12-31 DIAGNOSIS — K219 Gastro-esophageal reflux disease without esophagitis: Secondary | ICD-10-CM | POA: Diagnosis present

## 2015-12-31 DIAGNOSIS — Z96659 Presence of unspecified artificial knee joint: Secondary | ICD-10-CM

## 2015-12-31 DIAGNOSIS — Z96651 Presence of right artificial knee joint: Secondary | ICD-10-CM

## 2015-12-31 DIAGNOSIS — M48061 Spinal stenosis, lumbar region without neurogenic claudication: Secondary | ICD-10-CM | POA: Diagnosis present

## 2015-12-31 DIAGNOSIS — M25561 Pain in right knee: Secondary | ICD-10-CM | POA: Diagnosis present

## 2015-12-31 HISTORY — PX: TOTAL KNEE ARTHROPLASTY: SHX125

## 2015-12-31 LAB — TYPE AND SCREEN
ABO/RH(D): O POS
Antibody Screen: NEGATIVE

## 2015-12-31 SURGERY — ARTHROPLASTY, KNEE, TOTAL
Anesthesia: General | Site: Knee | Laterality: Right

## 2015-12-31 MED ORDER — DOCUSATE SODIUM 100 MG PO CAPS: 100.0000 mg | ORAL_CAPSULE | Freq: Two times a day (BID) | ORAL | 1 refills | Status: DC | PRN

## 2015-12-31 MED ORDER — SODIUM CHLORIDE 0.9 % IR SOLN
Status: DC | PRN
  Administered 2015-12-31: 2000 mL

## 2015-12-31 MED ORDER — ACETAMINOPHEN 325 MG PO TABS: 650.0000 mg | ORAL_TABLET | ORAL | Status: DC | PRN

## 2015-12-31 MED ORDER — SODIUM CHLORIDE 0.9 % IR SOLN
Status: AC
  Filled 2015-12-31: qty 500000

## 2015-12-31 MED ORDER — ALPRAZOLAM 0.5 MG PO TABS
0.5000 mg | ORAL_TABLET | Freq: Two times a day (BID) | ORAL | Status: DC
  Administered 2015-12-31 – 2016-01-02 (×5): 0.5 mg via ORAL
  Filled 2015-12-31 (×6): qty 1

## 2015-12-31 MED ORDER — RIVAROXABAN 10 MG PO TABS: 10.0000 mg | ORAL_TABLET | Freq: Every day | ORAL | 0 refills | Status: DC

## 2015-12-31 MED ORDER — BUPIVACAINE IN DEXTROSE 0.75-8.25 % IT SOLN
INTRATHECAL | Status: DC | PRN
  Administered 2015-12-31: 2 mL via INTRATHECAL

## 2015-12-31 MED ORDER — CLINDAMYCIN PHOSPHATE 900 MG/50ML IV SOLN
900.0000 mg | Freq: Three times a day (TID) | INTRAVENOUS | Status: AC
  Administered 2015-12-31: 900 mg via INTRAVENOUS
  Filled 2015-12-31: qty 50

## 2015-12-31 MED ORDER — ALPRAZOLAM 0.5 MG PO TABS: 0.5000 mg | ORAL_TABLET | Freq: Two times a day (BID) | ORAL | Status: DC

## 2015-12-31 MED ORDER — METHOCARBAMOL 500 MG PO TABS
500.0000 mg | ORAL_TABLET | Freq: Four times a day (QID) | ORAL | Status: DC | PRN
  Administered 2016-01-01 (×3): 500 mg via ORAL
  Filled 2015-12-31 (×3): qty 1

## 2015-12-31 MED ORDER — ONDANSETRON HCL 4 MG/2ML IJ SOLN
INTRAMUSCULAR | Status: AC
Start: 2015-12-31 — End: 2015-12-31
  Filled 2015-12-31: qty 2

## 2015-12-31 MED ORDER — MAGNESIUM CITRATE PO SOLN: 1.0000 | Freq: Once | ORAL | Status: DC | PRN

## 2015-12-31 MED ORDER — PROPOFOL 10 MG/ML IV BOLUS
INTRAVENOUS | Status: AC
  Filled 2015-12-31: qty 20

## 2015-12-31 MED ORDER — POLYETHYLENE GLYCOL 3350 17 G PO PACK: 17.0000 g | PACK | Freq: Every day | ORAL | 0 refills | Status: DC

## 2015-12-31 MED ORDER — ACETAMINOPHEN ER 650 MG PO TBCR: 1300.0000 mg | EXTENDED_RELEASE_TABLET | Freq: Three times a day (TID) | ORAL | Status: DC | PRN

## 2015-12-31 MED ORDER — CLINDAMYCIN PHOSPHATE 900 MG/50ML IV SOLN
INTRAVENOUS | Status: AC
  Filled 2015-12-31: qty 50

## 2015-12-31 MED ORDER — RIVAROXABAN 10 MG PO TABS
10.0000 mg | ORAL_TABLET | Freq: Every day | ORAL | Status: DC
  Administered 2016-01-01 – 2016-01-03 (×3): 10 mg via ORAL
  Filled 2015-12-31 (×3): qty 1

## 2015-12-31 MED ORDER — OXYCODONE-ACETAMINOPHEN 10-325 MG PO TABS: 1.0000 | ORAL_TABLET | ORAL | 0 refills | Status: DC | PRN

## 2015-12-31 MED ORDER — BISACODYL 5 MG PO TBEC
5.0000 mg | DELAYED_RELEASE_TABLET | Freq: Every day | ORAL | Status: DC | PRN
Start: 2015-12-31 — End: 2016-01-03

## 2015-12-31 MED ORDER — CLINDAMYCIN PHOSPHATE 900 MG/50ML IV SOLN
900.0000 mg | INTRAVENOUS | Status: AC
  Administered 2015-12-31: 900 mg via INTRAVENOUS

## 2015-12-31 MED ORDER — FENTANYL CITRATE (PF) 100 MCG/2ML IJ SOLN
INTRAMUSCULAR | Status: AC
  Filled 2015-12-31: qty 2

## 2015-12-31 MED ORDER — DEXAMETHASONE SODIUM PHOSPHATE 10 MG/ML IJ SOLN
INTRAMUSCULAR | Status: AC
  Filled 2015-12-31: qty 1

## 2015-12-31 MED ORDER — ACETAMINOPHEN 10 MG/ML IV SOLN
INTRAVENOUS | Status: AC
  Filled 2015-12-31: qty 100

## 2015-12-31 MED ORDER — CYCLOSPORINE 0.05 % OP EMUL
1.0000 [drp] | Freq: Two times a day (BID) | OPHTHALMIC | Status: DC
  Administered 2015-12-31 – 2016-01-02 (×5): 1 [drp] via OPHTHALMIC
  Filled 2015-12-31 (×6): qty 1

## 2015-12-31 MED ORDER — BUPIVACAINE HCL (PF) 0.5 % IJ SOLN
INTRAMUSCULAR | Status: AC
  Filled 2015-12-31: qty 30

## 2015-12-31 MED ORDER — PANTOPRAZOLE SODIUM 40 MG PO TBEC
40.0000 mg | DELAYED_RELEASE_TABLET | Freq: Every evening | ORAL | Status: DC
  Administered 2015-12-31 – 2016-01-02 (×3): 40 mg via ORAL
  Filled 2015-12-31 (×3): qty 1

## 2015-12-31 MED ORDER — HYDROMORPHONE HCL 1 MG/ML IJ SOLN
0.2500 mg | INTRAMUSCULAR | Status: DC | PRN
  Administered 2015-12-31 (×4): 0.5 mg via INTRAVENOUS

## 2015-12-31 MED ORDER — CEFAZOLIN SODIUM-DEXTROSE 2-4 GM/100ML-% IV SOLN
2.0000 g | INTRAVENOUS | Status: AC
  Administered 2015-12-31: 2 g via INTRAVENOUS

## 2015-12-31 MED ORDER — AZELASTINE HCL 0.1 % NA SOLN
1.0000 | Freq: Every day | NASAL | Status: DC
Start: 2016-01-01 — End: 2016-01-03
  Administered 2016-01-02 – 2016-01-03 (×2): 1 via NASAL
  Filled 2015-12-31: qty 30

## 2015-12-31 MED ORDER — ONDANSETRON HCL 4 MG/2ML IJ SOLN
INTRAMUSCULAR | Status: DC | PRN
  Administered 2015-12-31: 4 mg via INTRAVENOUS

## 2015-12-31 MED ORDER — LIDOCAINE-EPINEPHRINE (PF) 1 %-1:200000 IJ SOLN
INTRAMUSCULAR | Status: DC | PRN
  Administered 2015-12-31: 30 mL

## 2015-12-31 MED ORDER — ONDANSETRON HCL 4 MG/2ML IJ SOLN: 4.0000 mg | INTRAMUSCULAR | Status: DC | PRN

## 2015-12-31 MED ORDER — LIP MEDEX EX OINT
TOPICAL_OINTMENT | CUTANEOUS | Status: AC
  Filled 2015-12-31: qty 7

## 2015-12-31 MED ORDER — BUPIVACAINE HCL (PF) 0.25 % IJ SOLN
INTRAMUSCULAR | Status: DC | PRN
  Administered 2015-12-31: 30 mL

## 2015-12-31 MED ORDER — MENTHOL 3 MG MT LOZG: 1.0000 | LOZENGE | OROMUCOSAL | Status: DC | PRN

## 2015-12-31 MED ORDER — HYDROMORPHONE HCL 1 MG/ML IJ SOLN
0.5000 mg | INTRAMUSCULAR | Status: DC | PRN
  Administered 2015-12-31 – 2016-01-01 (×7): 1 mg via INTRAVENOUS
  Filled 2015-12-31 (×7): qty 1

## 2015-12-31 MED ORDER — HYDROMORPHONE HCL 1 MG/ML IJ SOLN
INTRAMUSCULAR | Status: AC
  Filled 2015-12-31: qty 1

## 2015-12-31 MED ORDER — LIDOCAINE-EPINEPHRINE (PF) 1 %-1:200000 IJ SOLN
INTRAMUSCULAR | Status: AC
  Filled 2015-12-31: qty 30

## 2015-12-31 MED ORDER — PHENYLEPHRINE HCL 10 MG/ML IJ SOLN
INTRAMUSCULAR | Status: AC
Start: 2015-12-31 — End: 2015-12-31
  Filled 2015-12-31: qty 1

## 2015-12-31 MED ORDER — HYDROCODONE-ACETAMINOPHEN 5-325 MG PO TABS
1.0000 | ORAL_TABLET | ORAL | Status: DC | PRN
  Administered 2015-12-31 – 2016-01-02 (×2): 2 via ORAL
  Filled 2015-12-31 (×3): qty 2

## 2015-12-31 MED ORDER — STERILE WATER FOR IRRIGATION IR SOLN
Status: DC | PRN
  Administered 2015-12-31: 2000 mL

## 2015-12-31 MED ORDER — LORATADINE 10 MG PO TABS
10.0000 mg | ORAL_TABLET | Freq: Every day | ORAL | Status: DC
  Administered 2016-01-01 – 2016-01-03 (×3): 10 mg via ORAL
  Filled 2015-12-31 (×3): qty 1

## 2015-12-31 MED ORDER — PROPOFOL 500 MG/50ML IV EMUL
INTRAVENOUS | Status: DC | PRN
  Administered 2015-12-31: 150 ug/kg/min via INTRAVENOUS

## 2015-12-31 MED ORDER — ALUM & MAG HYDROXIDE-SIMETH 200-200-20 MG/5ML PO SUSP: 30.0000 mL | Freq: Four times a day (QID) | ORAL | Status: DC | PRN

## 2015-12-31 MED ORDER — FENTANYL CITRATE (PF) 100 MCG/2ML IJ SOLN
INTRAMUSCULAR | Status: DC | PRN
  Administered 2015-12-31: 100 ug via INTRAVENOUS

## 2015-12-31 MED ORDER — HEMOSTATIC AGENTS (NO CHARGE) OPTIME
TOPICAL | Status: DC | PRN
  Administered 2015-12-31: 1 via TOPICAL

## 2015-12-31 MED ORDER — MIDAZOLAM HCL 5 MG/5ML IJ SOLN
INTRAMUSCULAR | Status: DC | PRN
  Administered 2015-12-31: 2 mg via INTRAVENOUS

## 2015-12-31 MED ORDER — PROPOFOL 10 MG/ML IV BOLUS
INTRAVENOUS | Status: AC
  Filled 2015-12-31: qty 60

## 2015-12-31 MED ORDER — ACETAMINOPHEN 10 MG/ML IV SOLN
INTRAVENOUS | Status: DC | PRN
  Administered 2015-12-31: 1000 mg via INTRAVENOUS

## 2015-12-31 MED ORDER — MIRABEGRON ER 50 MG PO TB24
50.0000 mg | ORAL_TABLET | Freq: Every evening | ORAL | Status: DC
  Administered 2015-12-31 – 2016-01-02 (×4): 50 mg via ORAL
  Filled 2015-12-31 (×5): qty 1

## 2015-12-31 MED ORDER — RISAQUAD PO CAPS
1.0000 | ORAL_CAPSULE | Freq: Every day | ORAL | Status: DC
  Administered 2016-01-01 – 2016-01-03 (×3): 1 via ORAL
  Filled 2015-12-31 (×3): qty 1

## 2015-12-31 MED ORDER — PHENOL 1.4 % MT LIQD: 1.0000 | OROMUCOSAL | Status: DC | PRN

## 2015-12-31 MED ORDER — ARTIFICIAL TEARS OP OINT
TOPICAL_OINTMENT | OPHTHALMIC | Status: AC
  Filled 2015-12-31: qty 3.5

## 2015-12-31 MED ORDER — DEXAMETHASONE SODIUM PHOSPHATE 10 MG/ML IJ SOLN
INTRAMUSCULAR | Status: DC | PRN
  Administered 2015-12-31: 10 mg via INTRAVENOUS

## 2015-12-31 MED ORDER — OXYCODONE-ACETAMINOPHEN 5-325 MG PO TABS
1.0000 | ORAL_TABLET | ORAL | Status: DC | PRN
  Administered 2015-12-31 – 2016-01-01 (×6): 2 via ORAL
  Filled 2015-12-31 (×6): qty 2

## 2015-12-31 MED ORDER — DIPHENHYDRAMINE HCL 12.5 MG/5ML PO ELIX
12.5000 mg | ORAL_SOLUTION | Freq: Four times a day (QID) | ORAL | Status: DC | PRN
  Administered 2015-12-31: 25 mg via ORAL
  Administered 2016-01-01: 12.5 mg via ORAL
  Administered 2016-01-01 – 2016-01-03 (×5): 25 mg via ORAL
  Filled 2015-12-31 (×4): qty 10
  Filled 2015-12-31 (×2): qty 5
  Filled 2015-12-31 (×2): qty 10

## 2015-12-31 MED ORDER — CEFAZOLIN SODIUM-DEXTROSE 2-4 GM/100ML-% IV SOLN
2.0000 g | Freq: Three times a day (TID) | INTRAVENOUS | Status: AC
  Administered 2015-12-31 – 2016-01-01 (×3): 2 g via INTRAVENOUS
  Filled 2015-12-31 (×3): qty 100

## 2015-12-31 MED ORDER — DEXTROSE 5 % IV SOLN
500.0000 mg | Freq: Four times a day (QID) | INTRAVENOUS | Status: DC | PRN
  Administered 2015-12-31: 500 mg via INTRAVENOUS
  Filled 2015-12-31: qty 5
  Filled 2015-12-31: qty 550

## 2015-12-31 MED ORDER — TRANEXAMIC ACID 1000 MG/10ML IV SOLN
1000.0000 mg | INTRAVENOUS | Status: AC
  Administered 2015-12-31: 1000 mg via INTRAVENOUS
  Filled 2015-12-31: qty 1100

## 2015-12-31 MED ORDER — POLYMYXIN B SULFATE 500000 UNITS IJ SOLR
INTRAMUSCULAR | Status: DC | PRN
  Administered 2015-12-31: 500 mL

## 2015-12-31 MED ORDER — ACETAMINOPHEN 650 MG RE SUPP: 650.0000 mg | RECTAL | Status: DC | PRN

## 2015-12-31 MED ORDER — PAROXETINE HCL 20 MG PO TABS
30.0000 mg | ORAL_TABLET | Freq: Every morning | ORAL | Status: DC
  Administered 2016-01-01 – 2016-01-03 (×3): 30 mg via ORAL
  Filled 2015-12-31: qty 2
  Filled 2015-12-31 (×2): qty 1

## 2015-12-31 MED ORDER — MIDAZOLAM HCL 2 MG/2ML IJ SOLN
INTRAMUSCULAR | Status: AC
  Filled 2015-12-31: qty 2

## 2015-12-31 MED ORDER — AMITRIPTYLINE HCL 25 MG PO TABS
50.0000 mg | ORAL_TABLET | ORAL | Status: DC
  Administered 2015-12-31 – 2016-01-02 (×3): 50 mg via ORAL
  Filled 2015-12-31: qty 2
  Filled 2015-12-31 (×3): qty 1

## 2015-12-31 MED ORDER — CEFAZOLIN SODIUM-DEXTROSE 2-4 GM/100ML-% IV SOLN
INTRAVENOUS | Status: AC
  Filled 2015-12-31: qty 100

## 2015-12-31 MED ORDER — PROPOFOL 10 MG/ML IV BOLUS
INTRAVENOUS | Status: DC | PRN
  Administered 2015-12-31: 30 mg via INTRAVENOUS
  Administered 2015-12-31 (×2): 20 mg via INTRAVENOUS

## 2015-12-31 MED ORDER — LIDOCAINE-EPINEPHRINE (PF) 2 %-1:200000 IJ SOLN
INTRAMUSCULAR | Status: AC
Start: 2015-12-31 — End: 2015-12-31
  Filled 2015-12-31: qty 20

## 2015-12-31 MED ORDER — KCL IN DEXTROSE-NACL 20-5-0.45 MEQ/L-%-% IV SOLN
INTRAVENOUS | Status: AC
  Administered 2015-12-31: 13:00:00 via INTRAVENOUS
  Filled 2015-12-31 (×2): qty 1000

## 2015-12-31 MED ORDER — LACTATED RINGERS IV SOLN
INTRAVENOUS | Status: DC
  Administered 2015-12-31 (×3): via INTRAVENOUS

## 2015-12-31 MED ORDER — METHOCARBAMOL 500 MG PO TABS: 500.0000 mg | ORAL_TABLET | Freq: Four times a day (QID) | ORAL | 1 refills | Status: DC | PRN

## 2015-12-31 MED ORDER — BUPIVACAINE HCL (PF) 0.25 % IJ SOLN
INTRAMUSCULAR | Status: AC
  Filled 2015-12-31: qty 30

## 2015-12-31 MED ORDER — PHENYLEPHRINE HCL 10 MG/ML IJ SOLN
INTRAVENOUS | Status: DC | PRN
  Administered 2015-12-31: 50 ug/min via INTRAVENOUS

## 2015-12-31 MED ORDER — DOCUSATE SODIUM 100 MG PO CAPS
100.0000 mg | ORAL_CAPSULE | Freq: Two times a day (BID) | ORAL | Status: DC
  Administered 2015-12-31 – 2016-01-03 (×6): 100 mg via ORAL
  Filled 2015-12-31 (×6): qty 1

## 2015-12-31 MED ORDER — POLYETHYLENE GLYCOL 3350 17 G PO PACK
17.0000 g | PACK | Freq: Every day | ORAL | Status: DC | PRN
  Filled 2015-12-31: qty 1

## 2015-12-31 SURGICAL SUPPLY — 65 items
BAG ZIPLOCK 12X15 (MISCELLANEOUS) ×2 IMPLANT
BANDAGE ACE 4X5 VEL STRL LF (GAUZE/BANDAGES/DRESSINGS) ×2 IMPLANT
BANDAGE ACE 6X5 VEL STRL LF (GAUZE/BANDAGES/DRESSINGS) ×2 IMPLANT
BLADE SAG 18X100X1.27 (BLADE) ×2 IMPLANT
BLADE SAW SGTL 13.0X1.19X90.0M (BLADE) ×2 IMPLANT
CAPT KNEE TOTAL 3 ATTUNE ×2 IMPLANT
CEMENT HV SMART SET (Cement) ×4 IMPLANT
CLOTH 2% CHLOROHEXIDINE 3PK (PERSONAL CARE ITEMS) ×2 IMPLANT
CUFF TOURN SGL QUICK 34 (TOURNIQUET CUFF)
CUFF TOURN SGL QUICK 44 (TOURNIQUET CUFF) ×2 IMPLANT
CUFF TRNQT CYL 34X4X40X1 (TOURNIQUET CUFF) IMPLANT
DECANTER SPIKE VIAL GLASS SM (MISCELLANEOUS) ×2 IMPLANT
DRAPE INCISE IOBAN 66X45 STRL (DRAPES) ×2 IMPLANT
DRAPE ORTHO SPLIT 77X108 STRL (DRAPES) ×2
DRAPE SHEET LG 3/4 BI-LAMINATE (DRAPES) ×2 IMPLANT
DRAPE SURG ORHT 6 SPLT 77X108 (DRAPES) ×2 IMPLANT
DRAPE U-SHAPE 47X51 STRL (DRAPES) ×2 IMPLANT
DRESSING AQUACEL AG SP 3.5X10 (GAUZE/BANDAGES/DRESSINGS) ×1 IMPLANT
DRSG AQUACEL AG ADV 3.5X10 (GAUZE/BANDAGES/DRESSINGS) IMPLANT
DRSG AQUACEL AG SP 3.5X10 (GAUZE/BANDAGES/DRESSINGS) ×2
DRSG TEGADERM 4X4.75 (GAUZE/BANDAGES/DRESSINGS) IMPLANT
DURAPREP 26ML APPLICATOR (WOUND CARE) ×2 IMPLANT
ELECT REM PT RETURN 9FT ADLT (ELECTROSURGICAL) ×2
ELECTRODE REM PT RTRN 9FT ADLT (ELECTROSURGICAL) ×1 IMPLANT
GAUZE SPONGE 2X2 8PLY STRL LF (GAUZE/BANDAGES/DRESSINGS) IMPLANT
GLOVE BIOGEL PI IND STRL 6.5 (GLOVE) ×1 IMPLANT
GLOVE BIOGEL PI IND STRL 7.0 (GLOVE) ×1 IMPLANT
GLOVE BIOGEL PI IND STRL 7.5 (GLOVE) ×5 IMPLANT
GLOVE BIOGEL PI IND STRL 8 (GLOVE) ×1 IMPLANT
GLOVE BIOGEL PI INDICATOR 6.5 (GLOVE) ×1
GLOVE BIOGEL PI INDICATOR 7.0 (GLOVE) ×1
GLOVE BIOGEL PI INDICATOR 7.5 (GLOVE) ×5
GLOVE BIOGEL PI INDICATOR 8 (GLOVE) ×1
GLOVE SURG SS PI 6.5 STRL IVOR (GLOVE) ×2 IMPLANT
GLOVE SURG SS PI 7.0 STRL IVOR (GLOVE) ×2 IMPLANT
GLOVE SURG SS PI 7.5 STRL IVOR (GLOVE) ×4 IMPLANT
GLOVE SURG SS PI 8.0 STRL IVOR (GLOVE) ×4 IMPLANT
GOWN STRL REUS W/ TWL XL LVL3 (GOWN DISPOSABLE) ×1 IMPLANT
GOWN STRL REUS W/TWL LRG LVL3 (GOWN DISPOSABLE) ×2 IMPLANT
GOWN STRL REUS W/TWL XL LVL3 (GOWN DISPOSABLE) ×7 IMPLANT
HANDPIECE INTERPULSE COAX TIP (DISPOSABLE) ×1
HEMOSTAT SPONGE AVITENE ULTRA (HEMOSTASIS) ×2 IMPLANT
IMMOBILIZER KNEE 20 (SOFTGOODS) ×2
IMMOBILIZER KNEE 20 THIGH 36 (SOFTGOODS) ×1 IMPLANT
MANIFOLD NEPTUNE II (INSTRUMENTS) ×2 IMPLANT
PACK TOTAL KNEE CUSTOM (KITS) ×2 IMPLANT
POSITIONER SURGICAL ARM (MISCELLANEOUS) ×2 IMPLANT
SET HNDPC FAN SPRY TIP SCT (DISPOSABLE) ×1 IMPLANT
SPONGE GAUZE 2X2 STER 10/PKG (GAUZE/BANDAGES/DRESSINGS)
STAPLER VISISTAT (STAPLE) IMPLANT
STRIP CLOSURE SKIN 1/2X4 (GAUZE/BANDAGES/DRESSINGS) IMPLANT
SUT BONE WAX W31G (SUTURE) IMPLANT
SUT MNCRL AB 4-0 PS2 18 (SUTURE) IMPLANT
SUT STRATAFIX 0 PDS 27 VIOLET (SUTURE) ×2
SUT VIC AB 1 CT1 27 (SUTURE) ×2
SUT VIC AB 1 CT1 27XBRD ANTBC (SUTURE) ×2 IMPLANT
SUT VIC AB 1 CT1 36 (SUTURE) ×2 IMPLANT
SUT VIC AB 2-0 CT1 27 (SUTURE) ×3
SUT VIC AB 2-0 CT1 TAPERPNT 27 (SUTURE) ×3 IMPLANT
SUTURE STRATFX 0 PDS 27 VIOLET (SUTURE) ×1 IMPLANT
SYR 50ML LL SCALE MARK (SYRINGE) ×2 IMPLANT
TOWER CARTRIDGE SMART MIX (DISPOSABLE) ×2 IMPLANT
TRAY FOLEY CATH SILVER 14FR (SET/KITS/TRAYS/PACK) ×2 IMPLANT
WRAP KNEE MAXI GEL POST OP (GAUZE/BANDAGES/DRESSINGS) ×2 IMPLANT
YANKAUER SUCT BULB TIP 10FT TU (MISCELLANEOUS) ×2 IMPLANT

## 2015-12-31 NOTE — Anesthesia Postprocedure Evaluation (Signed)
Anesthesia Post Note  Patient: RITA PARCHMENT  Procedure(s) Performed: Procedure(s) (LRB): RIGHT TOTAL KNEE ARTHROPLASTY (Right)  Patient location during evaluation: PACU Anesthesia Type: Spinal Level of consciousness: oriented and awake and alert Pain management: pain level controlled Vital Signs Assessment: post-procedure vital signs reviewed and stable Respiratory status: spontaneous breathing, respiratory function stable and patient connected to nasal cannula oxygen Cardiovascular status: blood pressure returned to baseline and stable Postop Assessment: no headache and no backache Anesthetic complications: no    Last Vitals:  Vitals:   12/31/15 1100 12/31/15 1115  BP: 129/74 120/72  Pulse: 78 80  Resp: 19 17  Temp:      Last Pain:  Vitals:   12/31/15 1115  TempSrc:   PainSc: 5                  Lasean Rahming S

## 2015-12-31 NOTE — Evaluation (Signed)
Physical Therapy Evaluation Patient Details Name: [name] [name] MRN: [id number] DOB: 1955-09-01 Today's Date: 12/31/2015   History of Present Illness  Pt s/p R TKR and with hx of L TKR (08)   Clinical Impression  Pt s/p R TKR presents with decreased R LE strength/ROM and post op pain limiting functional mobility. Pt should progress to dc home with family assist and HHPT follow up.    Follow Up Recommendations Home health PT    Equipment Recommendations  None recommended by PT    Recommendations for Other Services OT consult     Precautions / Restrictions Precautions Precautions: Knee;Fall Required Braces or Orthoses: Knee Immobilizer - Right Knee Immobilizer - Right: Discontinue once straight leg raise with < 10 degree lag Restrictions Weight Bearing Restrictions: No Other Position/Activity Restrictions: WBAT      Mobility  Bed Mobility Overal bed mobility: Needs Assistance Bed Mobility: Supine to Sit;Sit to Supine     Supine to sit: Min assist Sit to supine: Min assist   General bed mobility comments: min cues for sequence and assist for R LE  Transfers Overall transfer level: Needs assistance Equipment used: Rolling walker (2 wheeled) Transfers: Sit to/from Stand Sit to Stand: From elevated surface;Min assist         General transfer comment: cues for LE managment and use of UEs to self assist  Ambulation/Gait Ambulation/Gait assistance: Min assist Ambulation Distance (Feet): 30 Feet Assistive device: Rolling walker (2 wheeled) Gait Pattern/deviations: Step-to pattern;Decreased step length - right;Decreased step length - left;Shuffle;Decreased stance time - right;Trunk flexed Gait velocity: decr Gait velocity interpretation: Below normal speed for age/gender General Gait Details: cues for sequence, posture and position from [employer]            Wheelchair Mobility    Modified Rankin (Stroke Patients Only)       Balance                                              Pertinent Vitals/Pain Pain Assessment: 0-10 Pain Score: 10-Worst pain ever Faces Pain Scale: Hurts little more Pain Location: R knee Pain Descriptors / Indicators: Aching;Sore Pain Intervention(s): Limited activity within patient's tolerance;Monitored during session;Premedicated before session;Ice applied    Home Living Family/patient expects to be discharged to:: Private residence Living Arrangements: Spouse/significant other Available Help at Discharge: Family Type of Home: House Home Access: Stairs to enter Entrance Stairs-Rails: None (Has pull handle on the wall) Entrance Stairs-Number of Steps: 2 Home Layout: One level Home Equipment: Walker - 2 wheels;Crutches;Wheelchair - manual      Prior Function Level of Independence: Independent               Hand Dominance        Extremity/Trunk Assessment   Upper Extremity Assessment: Overall WFL for tasks assessed           Lower Extremity Assessment: RLE deficits/detail      Cervical / Trunk Assessment: Normal  Communication   Communication: No difficulties  Cognition Arousal/Alertness: Awake/alert Behavior During Therapy: WFL for tasks assessed/performed Overall Cognitive Status: Within Functional Limits for tasks assessed                      General Comments      Exercises Total Joint Exercises Ankle Circles/Pumps: AROM;Both;15 reps;Supine   Assessment/Plan    PT  Assessment Patient needs continued PT services  PT Problem List Decreased strength;Decreased range of motion;Decreased activity tolerance;Decreased mobility;Pain;Decreased knowledge of use of DME;Obesity          PT Treatment Interventions DME instruction;Gait training;Stair training;Functional mobility training;Therapeutic activities;Therapeutic exercise;Patient/family education    PT Goals (Current goals can be found in the Care Plan section)  Acute Rehab PT Goals Patient  Stated Goal: Get some sleep PT Goal Formulation: With patient Time For Goal Achievement: 01/04/16 Potential to Achieve Goals: Good    Frequency 7X/week   Barriers to discharge        Co-evaluation               End of Session Equipment Utilized During Treatment: Right knee immobilizer Activity Tolerance: Patient tolerated treatment well Patient left: in bed;with call bell/phone within reach;with family/visitor present Nurse Communication: Mobility status         Time: [id number] PT Time Calculation (min) (ACUTE ONLY): 31 min   Charges:   PT Evaluation $PT Eval Low Complexity: 1 Procedure PT Treatments $Gait Training: 8-22 mins   PT G Codes:        Taite Schoeppner 10-Jan-2016, 4:04 PM

## 2015-12-31 NOTE — Interval H&P Note (Signed)
History and Physical Interval Note:  12/31/2015 7:14 AM  [name] [name]  has presented today for surgery, with the diagnosis of RIGHT KNEE DJD  The various methods of treatment have been discussed with the patient and family. After consideration of risks, benefits and other options for treatment, the patient has consented to  Procedure(s): RIGHT TOTAL KNEE ARTHROPLASTY (Right) as a surgical intervention .  The patient's history has been reviewed, patient examined, no change in status, stable for surgery.  I have reviewed the patient's chart and labs.  Questions were answered to the patient's satisfaction.     [name]

## 2015-12-31 NOTE — Brief Op Note (Signed)
12/31/2015  9:44 AM  PATIENT:  [name] [name]  60 y.o. female  PRE-OPERATIVE DIAGNOSIS:  RIGHT KNEE DJD  POST-OPERATIVE DIAGNOSIS:  RIGHT KNEE DJD  PROCEDURE:  Procedure(s) with comments: RIGHT TOTAL KNEE ARTHROPLASTY (Right) - Adductor Block  SURGEON:  Surgeon(s) and Role:    * [name], MD - Primary  PHYSICIAN ASSISTANT:   ASSISTANTS: [place]   ANESTHESIA:   general  EBL:  Total I/O In: 2000 [I.V.:2000] Out: 400 [Urine:300; Blood:100]  BLOOD ADMINISTERED:none  DRAINS: none   LOCAL MEDICATIONS USED:  LIDOCAINE   SPECIMEN:  No Specimen  DISPOSITION OF SPECIMEN:  N/A  COUNTS:  YES  TOURNIQUET:   Total Tourniquet Time Documented: Thigh (Right) - 62 minutes Total: Thigh (Right) - 62 minutes   DICTATION: .Other Dictation: Dictation Number [id number]  PLAN OF CARE: Admit to inpatient   PATIENT DISPOSITION:  PACU - hemodynamically stable.   Delay start of Pharmacological VTE agent (>24hrs) due to surgical blood loss or risk of bleeding: no

## 2015-12-31 NOTE — Anesthesia Procedure Notes (Addendum)
Spinal  Patient location during procedure: OR End time: 12/31/2015 7:39 AM Staffing Performed: anesthesiologist  Preanesthetic Checklist Completed: patient identified, site marked, surgical consent, pre-op evaluation, timeout performed, IV checked, risks and benefits discussed and monitors and equipment checked Spinal Block Patient position: sitting Prep: ChloraPrep Patient monitoring: heart rate, continuous pulse ox and blood pressure Location: L3-4 Injection technique: single-shot Needle Needle type: Sprotte  Needle gauge: 24 G Needle length: 10 cm Additional Notes Expiration date of kit checked and confirmed. Patient tolerated procedure well, without complications.

## 2015-12-31 NOTE — Care Management Note (Signed)
Case Management Note  Patient Details  Name: CAMORA TREMAIN MRN: [id number] Date of Birth: 1955-09-14  Subjective/Objective:                  RIGHT TOTAL KNEE ARTHROPLASTY (Right) Action/Plan: Discharge planning Expected Discharge Date:                  Expected Discharge Plan:  [place]  In-House Referral:     Discharge planning Services  CM Consult  Post Acute Care Choice:  Home Health Choice offered to:  Patient  DME Arranged:  N/A DME Agency:  NA  HH Arranged:  PT [place] Agency:  [facility] ([facility]  Status of Service:  Completed, signed off  If discussed at [employer], dates discussed:    Additional Comments: CM met with pt in room to offer choice of home health agency. Pt chooses [facility] to render HHPT.  Referral given to [facility] rep, Tim.  Pt has both rolling walker and 3n1 at home.  No other CM needs were communicated. Dellie Catholic, RN 12/31/2015, 12:44 PM

## 2015-12-31 NOTE — Anesthesia Preprocedure Evaluation (Signed)
Anesthesia Evaluation  Patient identified by MRN, date of birth, ID band Patient awake    Reviewed: Allergy & Precautions, NPO status , Patient's Chart, lab work & pertinent test results  Airway Mallampati: II  TM Distance: >3 FB Neck ROM: Full    Dental no notable dental hx.    Pulmonary neg pulmonary ROS,    Pulmonary exam normal breath sounds clear to auscultation       Cardiovascular hypertension, Normal cardiovascular exam Rhythm:Regular Rate:Normal     Neuro/Psych negative neurological ROS  negative psych ROS   GI/Hepatic negative GI ROS, Neg liver ROS,   Endo/Other  Morbid obesity  Renal/GU negative Renal ROS  negative genitourinary   Musculoskeletal negative musculoskeletal ROS (+)   Abdominal   Peds negative pediatric ROS (+)  Hematology negative hematology ROS (+)   Anesthesia Other Findings   Reproductive/Obstetrics negative OB ROS                             Anesthesia Physical Anesthesia Plan  ASA: III  Anesthesia Plan: General   Post-op Pain Management:    Induction: Intravenous  Airway Management Planned: Oral ETT  Additional Equipment:   Intra-op Plan:   Post-operative Plan: Extubation in OR  Informed Consent: I have reviewed the patients History and Physical, chart, labs and discussed the procedure including the risks, benefits and alternatives for the proposed anesthesia with the patient or authorized representative who has indicated his/her understanding and acceptance.   Dental advisory given  Plan Discussed with: CRNA and Surgeon  Anesthesia Plan Comments:         Anesthesia Quick Evaluation

## 2015-12-31 NOTE — Transfer of Care (Signed)
Immediate Anesthesia Transfer of Care Note  Patient: [name] [name]  Procedure(s) Performed: Procedure(s) with comments: RIGHT TOTAL KNEE ARTHROPLASTY (Right) - Adductor Block  Patient Location: PACU  Anesthesia Type:Spinal  Level of Consciousness: awake, alert , oriented and patient cooperative  Airway & Oxygen Therapy: Patient Spontanous Breathing and Patient connected to face mask oxygen  Post-op Assessment: Report given to RN, Post -op Vital signs reviewed and stable and Patient moving all extremities X 4  Post vital signs: stable  Last Vitals:  Vitals:   12/31/15 0601  BP: 135/86  Pulse: 91  Resp: 18  Temp: 36.7 C    Last Pain:  Vitals:   12/31/15 0601  TempSrc: Oral      Patients Stated Pain Goal: 4 ([id number])  Complications: No apparent anesthesia complications

## 2015-12-31 NOTE — Interval H&P Note (Signed)
History and Physical Interval Note:  12/31/2015 7:13 AM  [name] [name]  has presented today for surgery, with the diagnosis of RIGHT KNEE DJD  The various methods of treatment have been discussed with the patient and family. After consideration of risks, benefits and other options for treatment, the patient has consented to  Procedure(s): RIGHT TOTAL KNEE ARTHROPLASTY (Right) as a surgical intervention .  The patient's history has been reviewed, patient examined, no change in status, stable for surgery.  I have reviewed the patient's chart and labs.  Questions were answered to the patient's satisfaction.     Adalynne Steffensmeier C

## 2015-12-31 NOTE — H&P (View-Only) (Signed)
[name] [name] DOB: 1955-06-01 Single / Language: [name] / Race: White Female  H&P Date: 12/25/15  Chief Complaint: R knee pain  History of Present Illness The patient is a 60 year old female who comes in today for a preoperative History and Physical. The patient is scheduled for a right total knee arthroplasty to be performed by Dr. Johnn Hai, MD at [facility] on December 31, 2015. [name] [name] reports chronic pain R knee for years, progressively worsening, refractory to conservative tx including both steroid and viscosupplementation injections, pain medications, bracing, activity modifications, quad strengthening, relative rest. Her pain is now interfering with ADL's and quality of life. She would like to proceed with total knee replacement on the right.  Dr. Tonita Cong and the patient mutually agreed to proceed with a R total knee replacement. Risks and benefits of the procedure were discussed including stiffness, suboptimal range of motion, persistent pain, infection requiring removal of prosthesis and reinsertion, need for prophylactic antibiotics in the future, for example, dental procedures, possible need for manipulation, revision in the future and also anesthetic complications including DVT, PE, etc. We discussed the perioperative course, time in the hospital, postoperative recovery and the need for elevation to control swelling. We also discussed the predicted range of motion and the probability that squatting and kneeling would be unobtainable in the future. In addition, postoperative anticoagulation was discussed. We have obtained preoperative medical clearance as necessary. Provided her illustrated handout and discussed it in detail. They will enroll in the total joint replacement educational forum at the hospital.  Pre-op appt was earlier this week. 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 Allergies Reconciled   Family History  Cerebrovascular Accident  grandfather mothers side Congestive Heart Failure  grandfather mothers side Heart Disease  grandfather mothers side Chronic Obstructive Lung Disease  father First Degree Relatives  Depression  child Heart disease in female family member before age 2  Hypertension  grandmother mothers side and grandfather mothers side Diabetes Mellitus  grandfather fathers side Cancer  First Degree Relatives. father and brother  Social History Tobacco use  Never smoker. 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 Ferrous Sulfate (325 (65 Fe)MG Tablet, Oral) Active. Allegra Allergy (60MG  Tablet, Oral) Active. Paxil (30MG  Tablet, Oral) Active. Myrbetriq (Oral) Specific strength unknown - Active. ALPRAZolam (0.5MG  Tablet, Oral) Active.  (PRN) Amitriptyline HCl (50MG  Tablet, Oral)  Active. Astepro (0.15% Solution, Nasal) Active. Omeprazole (40MG  Capsule DR, Oral) Active. Vitamin D2 (Oral) Specific strength unknown - Active. Ketotifen Fumarate (0.025% Solution, Ophthalmic) Active. Medications Reconciled  Pregnancy / Birth History Pregnant  no  Past Surgical History  Cataract Surgery  bilateral Arthroscopy of Shoulder  right Foot Surgery  bilateral Sinus Surgery  Other Surgery  Mini Gastric Bypass, Tummy Tuck and Arm Tuck Total Knee Replacement  left Arthroscopy of Knee  left Spinal Fusion - Neck   Review of Systems General Not Present- Chills, Fatigue, Fever, Memory Loss, Night Sweats, Weight Gain and Weight Loss. Skin Not Present- Eczema, Hives, Itching, Lesions and Rash. HEENT Not Present- Dentures, Double Vision, Headache, Hearing Loss, Tinnitus and Visual Loss. Respiratory Present- Allergies. Not Present- Chronic Cough, Coughing up blood, Shortness of breath at rest and Shortness of breath with exertion. Cardiovascular Not Present- Chest Pain, Difficulty Breathing Lying Down, Murmur, Palpitations, Racing/skipping heartbeats and Swelling. Gastrointestinal Not Present- Abdominal Pain, Bloody Stool, Constipation, Diarrhea, Difficulty Swallowing, Heartburn, Jaundice, Loss of appetitie, Nausea and Vomiting. Female Genitourinary Present- Incontinence, Urinary frequency, Urinating at Night and Weak urinary stream. Not Present- Blood in Urine, Discharge, Flank Pain, Painful Urination, Urgency and Urinary Retention. Musculoskeletal Present- Joint Pain, Joint Swelling, Morning Stiffness and Muscle Weakness. Not Present- Back Pain, Muscle Pain and Spasms. Neurological Not Present- Blackout spells, Difficulty with balance, Dizziness, Paralysis, Tremor and Weakness. Psychiatric Not Present- Insomnia.  Physical Exam General Mental Status -Alert, cooperative and good historian. General Appearance-pleasant,  Not in acute distress. Orientation-Oriented X3. Build & Nutrition-Well nourished and Well developed.  Head and Neck Head-normocephalic, atraumatic . Neck Global Assessment - supple, no bruit auscultated on the right, no bruit auscultated on the left.  Eye Pupil - Bilateral-Regular and Round. Motion - Bilateral-EOMI.  Chest and Lung Exam Auscultation Breath sounds - clear at anterior chest wall and clear at posterior chest wall. Adventitious sounds - No Adventitious sounds.  Cardiovascular Auscultation Rhythm - Regular rate and rhythm. Heart Sounds - S1 WNL and S2 WNL. Murmurs & Other Heart Sounds - Auscultation of the heart reveals - No Murmurs.  Abdomen Palpation/Percussion Tenderness - Abdomen is non-tender to palpation. Rigidity (guarding) - Abdomen is soft. Auscultation Auscultation of the abdomen reveals - Bowel sounds normal.  Female Genitourinary Not done, not pertinent to present illness  Musculoskeletal She is obese. She has an antalgic gait. Right knee tender to palpation medial joint line, nontender lateral joint line, patella, patellar tendon, quad tendon, fibular head, peroneal nerve, popliteal space. No calf pain or sign of DVT. No pain or laxity with varus or valgus stress. Range of motion is approximately 0 to 100 degrees, has an antalgic gait.  Imaging X-rays reviewed by Dr. Tonita Cong left total knee in excellent alignment with no signs of osteolysis or loosening. Right knee bone-on-bone medial joint space, varus deformity, even with some bony erosion noted there at the medial joint space.  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 Tonita Cong on 12/31/15. We again discussed the procedure itself as well as risks, complications and alternatives, including but not limited to DVT, PE, infx, bleeding, failure  of procedure, need for secondary procedure including manipulation, nerve injury, ongoing pain/symptoms, anesthesia risk, even stroke or death. Also discussed typical post-op protocols, activity restrictions, need for PT, flexion/extension exercises, time out of work. Discussed need for DVT ppx post-op with Xarelto then ASA  per protocol. Discussed dental ppx. Also discussed limitations post-operatively such as kneeling and squatting. All questions were answered. Patient desires to proceed with surgery as scheduled. Will hold ASA, supplements and NSAIDs accordingly. Will remain NPO after MN night before surgery. Will present to [facility] for pre-op testing. Plan Xarelto 2 weeks post-op for DVT ppx then ASA. Plan Percocet plus benadryl prn, Robaxin, Colace, Miralax. Plan home with HHPT post-op with family members at home for assistance. Will follow up 10-14 days post-op for staple removal and xrays.  Signed electronically by Cecilie Kicks, PA-C for Dr. Tonita Cong

## 2015-12-31 NOTE — Interval H&P Note (Signed)
History and Physical Interval Note:  12/31/2015 7:14 AM  [name] [name]  has presented today for surgery, with the diagnosis of RIGHT KNEE DJD  The various methods of treatment have been discussed with the patient and family. After consideration of risks, benefits and other options for treatment, the patient has consented to  Procedure(s): RIGHT TOTAL KNEE ARTHROPLASTY (Right) as a surgical intervention .  The patient's history has been reviewed, patient examined, no change in status, stable for surgery.  I have reviewed the patient's chart and labs.  Questions were answered to the patient's satisfaction.     Valarie Farace C

## 2016-01-01 LAB — CBC
HEMATOCRIT: 35.8 % — AB (ref 36.0–46.0)
HEMOGLOBIN: 11.3 g/dL — AB (ref 12.0–15.0)
MCH: 28.9 pg (ref 26.0–34.0)
MCHC: 31.6 g/dL (ref 30.0–36.0)
MCV: 91.6 fL (ref 78.0–100.0)
Platelets: 280 10*3/uL (ref 150–400)
RBC: 3.91 MIL/uL (ref 3.87–5.11)
RDW: 14.2 % (ref 11.5–15.5)
WBC: 13.2 10*3/uL — ABNORMAL HIGH (ref 4.0–10.5)

## 2016-01-01 LAB — BASIC METABOLIC PANEL
Anion gap: 5 (ref 5–15)
BUN: 14 mg/dL (ref 6–20)
CHLORIDE: 108 mmol/L (ref 101–111)
CO2: 24 mmol/L (ref 22–32)
CREATININE: 0.75 mg/dL (ref 0.44–1.00)
Calcium: 8.1 mg/dL — ABNORMAL LOW (ref 8.9–10.3)
GFR calc Af Amer: >60 mL/min (ref >60)
GFR calc non Af Amer: >60 mL/min (ref >60)
Glucose, Bld: 133 mg/dL — ABNORMAL HIGH (ref 65–99)
Potassium: 5.2 mmol/L — ABNORMAL HIGH (ref 3.5–5.1)
Sodium: 137 mmol/L (ref 135–145)

## 2016-01-01 MED ORDER — OXYCODONE HCL 5 MG PO TABS
5.0000 mg | ORAL_TABLET | ORAL | Status: DC | PRN
  Administered 2016-01-02 – 2016-01-03 (×6): 15 mg via ORAL
  Filled 2016-01-01 (×6): qty 3

## 2016-01-01 MED ORDER — OXYCODONE HCL 5 MG PO TABS
5.0000 mg | ORAL_TABLET | ORAL | Status: DC | PRN
  Administered 2016-01-01 (×2): 15 mg via ORAL
  Filled 2016-01-01 (×2): qty 3

## 2016-01-01 MED ORDER — ACETAMINOPHEN ER 650 MG PO TBCR: 1300.0000 mg | EXTENDED_RELEASE_TABLET | Freq: Three times a day (TID) | ORAL | Status: AC | PRN

## 2016-01-01 NOTE — Op Note (Signed)
NAMECHAVY, COMRIE NO.:  [id number]  MEDICAL RECORD NO.:  [id number]  LOCATION:  [facility]                         FACILITY:  [facility]  PHYSICIAN:  [name], M.D.    DATE OF BIRTH:  [birth year]  DATE OF PROCEDURE:  12/31/2015 DATE OF DISCHARGE:  12/23/2015                              OPERATIVE REPORT   PREOPERATIVE DIAGNOSES: 1. End-stage osteoarthrosis of the right knee. 2. Elevated BMI.  POSTOPERATIVE DIAGNOSES: 1. End-stage osteoarthrosis of the right knee. 2. Elevated BMI.  PROCEDURE PERFORMED:  Right total knee arthroplasty utilizing DePuy Attune, 5 femur, 5 tibia, 7-mm insert, 38 patella.  ANESTHESIA:  Spinal and adductor block.  ASSISTANT:  Cleophas Dunker, PA, was used throughout the case for patient positioning, holding closure and exposure.  INDICATIONS:  A 60 year old, bone-on-bone arthrosis, medial compartment of the knee refractory to conservative treatment, significant negative affect to her activities of daily living, in fact rest, activity modification, knee arthroscopy, physical therapy and injections, bone-on- bone arthrosis, medial compartment, patellofemoral joint, indicated for placement of degenerated joint.  Risk and benefits were discussed including bleeding, infection, damage to neurovascular structures, DVT, PE, anesthetic complications, etc.  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  The right lower extremity was prepped, draped, and exsanguinated in usual sterile fashion.  Thigh tourniquet was inflated to 275 mmHg.  Midline incision was then made over the knee.  Full-thickness flaps developed. Subcutaneous adipose tissue noted.  Median parapatellar arthrotomy was performed.  Patella everted.  Knee flexed.  Tricompartmental osteoarthrosis was noted in particularly medial  compartment, osteophytes were removed with a rongeur.  Leksell was utilized to remove the remnants of medial and lateral menisci and the ACL.  We elevated the soft tissues medially and preserving the MCL.  Step drill was utilized in the femoral canal.  It was irrigated and a 5-degree right was used with 9 off the distal femur, pinned.  We performed our distal femoral cut.  We then carefully subluxed the tibia.  External alignment guide, bisecting the tibiotalar joint 3 degrees of slope, parallel to the tibial shaft, 2 off the defect, which was medial with a 3- degree slope, this was then pinned, evaluated and then cut.  Soft tissue was protected posteriorly at all times.  We checked our extension gap and that was slightly hyperextended with a 6 block.  We then flexed the knee and completed the tibia.  It was sized to a 5.  It was then just to the medial aspect of the tibial tubercle.  We then used our central drilling technique and our punch guide.  With the trial tibia placed, turned attention towards the femur.  We measured off the distal femur, the anterior cortex, which was a 4.5.  3 degrees of external rotation, this was pinned and we tried a 4 and then a 5 block.  The 5 seemed to be a better fit with closing of the flexion gap.  This was then pinned.  We performed the anterior, posterior and chamfer cuts.  Soft tissue was protected at all times throughout  the case with slight difficult exposure due to the patient's elevated BMI.  We performed our anterior, posterior, chamfer cuts and then our box cut bisecting the canal.  This was without difficulty.  I then placed a trial femur, drilled our lug holes and a 6-mm insert, reduced the knee.  She had slight hyperextension with good flexion and good stability with varus and valgus stressing at 0-30 degrees.  Attention turned towards the patella, was measured at a 25, planed to a 14, sized to a 38 medializing our PEG holes, which were  drilled.  We placed a trial patella parallel to the joint line and the 38 trial patella, which was reduced, held with towel clip, and we had excellent patellofemoral tracking.  We then removed all components and checked posteriorly, any remnants of the menisci removed, cauterized our geniculate, popliteus was slightly attenuated.  Pulsatile lavage was used to clean the knee.  We mixed the cement on the back table in appropriate fashion.  We flexed the knee, subluxed the tibia. Thoroughly dried all surfaces, injected the cement into the tibial canal.  Digitally pressurizing it, we impacted our 5 tibial tray.  Then, we impacted our 5 narrow femur, redundant cement removed, placed the trial 6 and reduced it, on an axial load throughout the curing of the cement.  We cemented the 38 patella and applied the clamp.  Redundant cement removed.  We injected periarticular tissues with lidocaine with epinephrine and Marcaine.  Bone wax on the cancellous surfaces after curing the cement.  Tourniquet was deflated at 62 minutes.  Knee was flexed.  Meticulously, we removed all redundant cement and irrigated the wound.  We trialed a 7, which seemed to be better than the 6 in terms of extension.  We selected the 7.  We then placed a 7 insert, reduced it, full extension, full flexion, good stability, varus and valgus stressing at 0-30 degrees, negative anterior drawer.  There was no significant active bleeding and we cauterized any bleeding tissue.  We placed Lidocaine with epinephrine into the joint.  We repaired the patellar arthrotomy with #1 Vicryl interrupted figure-of-eight sutures and then a running Stratafix due to her elevated BMI.  Multiple layers of 2-0 in the subcutaneous adipose tissue and staples for the skin.  Copiously irrigated throughout.  The wound was dressed sterilely.  Knee immobilizer was placed.  She was then transferred to the hospital bed and to the recovery room in satisfactory  condition.  The patient tolerated the procedure well.  No complications.  Blood loss, minimal.     [name], M.D.     JB/MEDQ  D:  12/31/2015  T:  01/01/2016  Job:  [id number]

## 2016-01-01 NOTE — Progress Notes (Signed)
Patient ID: [name] [name], female   DOB: 02/23/1955, 60 y.o.   MRN: [id number]  Subjective: 1 Day Post-Op Procedure(s) (LRB): RIGHT TOTAL KNEE ARTHROPLASTY (Right)  Seen in AM rounds for Dr. Tonita Cong Patient reports pain as moderate.    Patient has complaints of R knee pain. Has chronic cystitis states foley is causing burning and irritation. Has started taking an at home med this morning for that. Ready for foley to come out.  We will start therapy today. Plan is to go home with HHPT after hospital stay. No other c/o.  Objective: Vital signs in last 24 hours: Temp:  [97.5 F (36.4 C)-98.4 F (36.9 C)] 97.8 F (36.6 C) (11/17 0618) Pulse Rate:  [77-96] 78 (11/17 0618) Resp:  [16-22] 16 (11/17 0618) BP: (104-139)/(59-88) 124/64 (11/17 0618) SpO2:  [95 %-100 %] 100 % (11/17 0618)  Intake/Output from previous day:  Intake/Output Summary (Last 24 hours) at 01/01/16 0749 Last data filed at 01/01/16 0500  Gross per 24 hour  Intake           4967.5 ml  Output             2750 ml  Net           2217.5 ml    Intake/Output this shift: No intake/output data recorded.  Labs: Results for orders placed or performed during the hospital encounter of 12/31/15  CBC  Result Value Ref Range   WBC 13.2 (H) 4.0 - 10.5 K/uL   RBC 3.91 3.87 - 5.11 MIL/uL   Hemoglobin 11.3 (L) 12.0 - 15.0 g/dL   HCT 35.8 (L) 36.0 - 46.0 %   MCV 91.6 78.0 - 100.0 fL   MCH 28.9 26.0 - 34.0 pg   MCHC 31.6 30.0 - 36.0 g/dL   RDW 14.2 11.5 - 15.5 %   Platelets 280 150 - 400 K/uL  Basic Metabolic Panel  Result Value Ref Range   Sodium 137 135 - 145 mmol/L   Potassium 5.2 (H) 3.5 - 5.1 mmol/L   Chloride 108 101 - 111 mmol/L   CO2 24 22 - 32 mmol/L   Glucose, Bld 133 (H) 65 - 99 mg/dL   BUN 14 6 - 20 mg/dL   Creatinine, Ser 0.75 0.44 - 1.00 mg/dL   Calcium 8.1 (L) 8.9 - 10.3 mg/dL   GFR calc non Af Amer >60 >60 mL/min   GFR calc Af Amer >60 >60 mL/min   Anion gap 5 5 - 15    Exam - Neurologically  intact ABD soft Neurovascular intact Sensation intact distally Intact pulses distally Dorsiflexion/Plantar flexion intact Incision: dressing C/D/I and no drainage No cellulitis present Compartment soft no sign of DVT Dressing - clean, dry, no drainage Motor function intact - moving foot and toes well on exam.   Assessment/Plan: 1 Day Post-Op Procedure(s) (LRB): RIGHT TOTAL KNEE ARTHROPLASTY (Right)  Advance diet Up with therapy D/C IV fluids Past Medical History:  Diagnosis Date  . Allergy   . Anemia    takes iron supplement  . Anxiety   . Arthritis   . 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 Elevated alkaline phosphatase level    negative workup (presumed secondary to fatty liver)  . Frequency-urgency syndrome   . GERD (gastroesophageal reflux disease)   . Interstitial cystitis    Dr Vernie Shanks  . Neuromuscular disorder (HCC)    rt arm carpal tunnel syndrome  . Obesity    status post bariatric procedure in [facility] 2005  .  Osteoporosis   . Thyroid nodule 7/10   `  . Thyroid nodule   . Varicose vein of leg     DVT Prophylaxis - Xarelto Protocol Weight-Bearing as tolerated to Right leg No vaccines. Foley out C/o exacerbation of chronic cystitis symptoms with foley. If still experiencing burning today will obtain UA/C&S Ok for home cystitis med Plan D/C home with HHPT when ready- tomorrow  Vs. Sunday Will discuss with Dr. Mliss Fritz, [place]. 01/01/2016, 7:49 AM

## 2016-01-01 NOTE — Discharge Instructions (Signed)
Elevate leg above heart 6x a day for 20minutes each °Use knee immobilizer while walking until can SLR x 10 °Use knee immobilizer in bed to keep knee in extension °Aquacel dressing may remain in place until follow up. May shower with aquacel dressing in place. If the dressing becomes saturated or peels off, you may remove aquacel dressing. Do not remove steri-strips if they are present. Place new dressing with gauze and tape or ACE bandage which should be kept clean and dry and changed daily. ° °INSTRUCTIONS AFTER JOINT REPLACEMENT  ° °o Remove items at home which could result in a fall. This includes throw rugs or furniture in walking pathways °o ICE to the affected joint every three hours while awake for 30 minutes at a time, for at least the first 3-5 days, and then as needed for pain and swelling.  Continue to use ice for pain and swelling. You may notice swelling that will progress down to the foot and ankle.  This is normal after surgery.  Elevate your leg when you are not up walking on it.   °o Continue to use the breathing machine you got in the hospital (incentive spirometer) which will help keep your temperature down.  It is common for your temperature to cycle up and down following surgery, especially at night when you are not up moving around and exerting yourself.  The breathing machine keeps your lungs expanded and your temperature down. ° ° °DIET:  As you were doing prior to hospitalization, we recommend a well-balanced diet. ° °DRESSING / WOUND CARE / SHOWERING ° °Keep the surgical dressing until follow up.  The dressing is water proof, so you can shower without any extra covering.  IF THE DRESSING FALLS OFF or the wound gets wet inside, change the dressing with sterile gauze.  Please use good hand washing techniques before changing the dressing.  Do not use any lotions or creams on the incision until instructed by your surgeon.   ° °ACTIVITY ° °o Increase activity slowly as tolerated, but follow the  weight bearing instructions below.   °o No driving for 6 weeks or until further direction given by your physician.  You cannot drive while taking narcotics.  °o No lifting or carrying greater than 10 lbs. until further directed by your surgeon. °o Avoid periods of inactivity such as sitting longer than an hour when not asleep. This helps prevent blood clots.  °o You may return to work once you are authorized by your doctor.  ° ° ° °WEIGHT BEARING  ° °Weight bearing as tolerated with assist device (walker, cane, etc) as directed, use it as long as suggested by your surgeon or therapist, typically at least 4-6 weeks. ° ° °EXERCISES ° °Results after joint replacement surgery are often greatly improved when you follow the exercise, range of motion and muscle strengthening exercises prescribed by your doctor. Safety measures are also important to protect the joint from further injury. Any time any of these exercises cause you to have increased pain or swelling, decrease what you are doing until you are comfortable again and then slowly increase them. If you have problems or questions, call your caregiver or physical therapist for advice.  ° °Rehabilitation is important following a joint replacement. After just a few days of immobilization, the muscles of the leg can become weakened and shrink (atrophy).  These exercises are designed to build up the tone and strength of the thigh and leg muscles and to improve motion. Often   times heat used for twenty to thirty minutes before working out will loosen up your tissues and help with improving the range of motion but do not use heat for the first two weeks following surgery (sometimes heat can increase post-operative swelling).  ° °These exercises can be done on a training (exercise) mat, on the floor, on a table or on a bed. Use whatever works the best and is most comfortable for you.    Use music or television while you are exercising so that the exercises are a pleasant  break in your day. This will make your life better with the exercises acting as a break in your routine that you can look forward to.   Perform all exercises about fifteen times, three times per day or as directed.  You should exercise both the operative leg and the other leg as well. ° °Exercises include: °  °• Quad Sets - Tighten up the muscle on the front of the thigh (Quad) and hold for 5-10 seconds.   °• Straight Leg Raises - With your knee straight (if you were given a brace, keep it on), lift the leg to 60 degrees, hold for 3 seconds, and slowly lower the leg.  Perform this exercise against resistance later as your leg gets stronger.  °• Leg Slides: Lying on your back, slowly slide your foot toward your buttocks, bending your knee up off the floor (only go as far as is comfortable). Then slowly slide your foot back down until your leg is flat on the floor again.  °• Angel Wings: Lying on your back spread your legs to the side as far apart as you can without causing discomfort.  °• Hamstring Strength:  Lying on your back, push your heel against the floor with your leg straight by tightening up the muscles of your buttocks.  Repeat, but this time bend your knee to a comfortable angle, and push your heel against the floor.  You may put a pillow under the heel to make it more comfortable if necessary.  ° °A rehabilitation program following joint replacement surgery can speed recovery and prevent re-injury in the future due to weakened muscles. Contact your doctor or a physical therapist for more information on knee rehabilitation.  ° ° °CONSTIPATION ° °Constipation is defined medically as fewer than three stools per week and severe constipation as less than one stool per week.  Even if you have a regular bowel pattern at home, your normal regimen is likely to be disrupted due to multiple reasons following surgery.  Combination of anesthesia, postoperative narcotics, change in appetite and fluid intake all can  affect your bowels.  ° °YOU MUST use at least one of the following options; they are listed in order of increasing strength to get the job done.  They are all available over the counter, and you may need to use some, POSSIBLY even all of these options:   ° °Drink plenty of fluids (prune juice may be helpful) and high fiber foods °Colace 100 mg by mouth twice a day  °Senokot for constipation as directed and as needed Dulcolax (bisacodyl), take with full glass of water  °Miralax (polyethylene glycol) once or twice a day as needed. ° °If you have tried all these things and are unable to have a bowel movement in the first 3-4 days after surgery call either your surgeon or your primary doctor.   ° °If you experience loose stools or diarrhea, hold the medications until you stool   forms back up.  If your symptoms do not get better within 1 week or if they get worse, check with your doctor.  If you experience "the worst abdominal pain ever" or develop nausea or vomiting, please contact the office immediately for further recommendations for treatment.   ITCHING:  If you experience itching with your medications, try taking only a single pain pill, or even half a pain pill at a time.  You can also use Benadryl over the counter for itching or also to help with sleep.   TED HOSE STOCKINGS:  Use stockings on both legs until for at least 2 weeks or as directed by physician office. They may be removed at night for sleeping.  MEDICATIONS:  See your medication summary on the After Visit Summary that nursing will review with you.  You may have some home medications which will be placed on hold until you complete the course of blood thinner medication.  It is important for you to complete the blood thinner medication as prescribed.  PRECAUTIONS:  If you experience chest pain or shortness of breath - call 911 immediately for transfer to the hospital emergency department.   If you develop a fever greater that 101 F, purulent  drainage from wound, increased redness or drainage from wound, foul odor from the wound/dressing, or calf pain - CONTACT YOUR SURGEON.                                                   FOLLOW-UP APPOINTMENTS:  If you do not already have a post-op appointment, please call the office for an appointment to be seen by your surgeon.  Guidelines for how soon to be seen are listed in your After Visit Summary, but are typically between 1-4 weeks after surgery.  OTHER INSTRUCTIONS:   Knee Replacement:  Do not place pillow under knee, focus on keeping the knee straight while resting. CPM instructions: 0-90 degrees, 2 hours in the morning, 2 hours in the afternoon, and 2 hours in the evening. Place foam block, curve side up under heel at all times except when in CPM or when walking.  DO NOT modify, tear, cut, or change the foam block in any way.  MAKE SURE YOU:   Understand these instructions.   Get help right away if you are not doing well or get worse.    Thank you for letting us be a part of your medical care team.  It is a privilege we respect greatly.  We hope these instructions will help you stay on track for a fast and full recovery!    Information on my medicine - XARELTO (Rivaroxaban)  This medication education was reviewed with me or my healthcare representative as part of my discharge preparation.  The pharmacist that spoke with me during my hospital stay was:  Leiliana Foody, [facility]  Why was Xarelto prescribed for you? Xarelto was prescribed for you to reduce the risk of blood clots forming after orthopedic surgery. The medical term for these abnormal blood clots is venous thromboembolism (VTE).  What do you need to know about xarelto ? Take your Xarelto ONCE DAILY at the same time every day. You may take it either with or without food.  If you have difficulty swallowing the tablet whole, you may crush it and mix in applesauce just prior to taking your  dose.  Take Xarelto  exactly as prescribed by your doctor and DO NOT stop taking Xarelto without talking to the doctor who prescribed the medication.  Stopping without other VTE prevention medication to take the place of Xarelto may increase your risk of developing a clot.  After discharge, you should have regular check-up appointments with your healthcare provider that is prescribing your Xarelto.    What do you do if you miss a dose? If you miss a dose, take it as soon as you remember on the same day then continue your regularly scheduled once daily regimen the next day. Do not take two doses of Xarelto on the same day.   Important Safety Information A possible side effect of Xarelto is bleeding. You should call your healthcare provider right away if you experience any of the following: ? Bleeding from an injury or your nose that does not stop. ? Unusual colored urine (red or dark brown) or unusual colored stools (red or black). ? Unusual bruising for unknown reasons. ? A serious fall or if you hit your head (even if there is no bleeding).  Some medicines may interact with Xarelto and might increase your risk of bleeding while on Xarelto. To help avoid this, consult your healthcare provider or pharmacist prior to using any new prescription or non-prescription medications, including herbals, vitamins, non-steroidal anti-inflammatory drugs (NSAIDs) and supplements.  This website has more information on Xarelto: [email or web address]

## 2016-01-01 NOTE — Progress Notes (Signed)
Physical Therapy Treatment Patient Details Name: [name] [name] MRN: [id number] DOB: 1955-06-12 Today's Date: 01/01/2016    History of Present Illness Pt s/p R TKR and with hx of L TKR (08)     PT Comments    The patient c/o increased pain but did ambulate x 30'. Continue PT while in acute care. Plans DC to home when more ambulatory.  Follow Up Recommendations  Home health PT     Equipment Recommendations  None recommended by PT    Recommendations for Other Services       Precautions / Restrictions Precautions Precautions: Knee;Fall Required Braces or Orthoses: Knee Immobilizer - Right Knee Immobilizer - Right: Discontinue once straight leg raise with < 10 degree lag Restrictions Other Position/Activity Restrictions: WBAT    Mobility  Bed Mobility Overal bed mobility: Needs Assistance Bed Mobility: Sit to Supine     Supine to sit: Mod assist Sit to supine: Min assist   General bed mobility comments: assist  right leg onto the bed  Transfers Overall transfer level: Needs assistance Equipment used: Rolling walker (2 wheeled) Transfers: Sit to/from Stand Sit to Stand: Min assist         General transfer comment: cues for LE managment and use of UEs to self assist  Ambulation/Gait Ambulation/Gait assistance: Min assist Ambulation Distance (Feet): 30 Feet Assistive device: Rolling walker (2 wheeled) Gait Pattern/deviations: Step-to pattern;Antalgic;Decreased step length - right         Stairs            Wheelchair Mobility    Modified Rankin (Stroke Patients Only)       Balance                                    Cognition Arousal/Alertness: Awake/alert Behavior During Therapy: Anxious Overall Cognitive Status: Within Functional Limits for tasks assessed                      Exercises      General Comments        Pertinent Vitals/Pain Pain Score: 9  Pain Location: right knee Pain Descriptors /  Indicators: Sore;Crying Pain Intervention(s): Limited activity within patient's tolerance;Monitored during session;Patient requesting pain meds-RN notified;Ice applied    Home Living Family/patient expects to be discharged to:: Private residence Living Arrangements: Spouse/significant other Available Help at Discharge: Family Type of Home: House Home Access: Stairs to enter Entrance Stairs-Rails: None (Has pull handle on the wall) Home Layout: One level Home Equipment: Walker - 2 wheels;Crutches;Wheelchair - manual      Prior Function Level of Independence: Independent          PT Goals (current goals can now be found in the care plan section) Acute Rehab PT Goals Patient Stated Goal: did not state Progress towards PT goals: Progressing toward goals    Frequency    7X/week      PT Plan Current plan remains appropriate    Co-evaluation             End of Session Equipment Utilized During Treatment: Right knee immobilizer Activity Tolerance: Patient limited by pain Patient left: in bed;with call bell/phone within reach     Time: 1435-1451 PT Time Calculation (min) (ACUTE ONLY): 16 min  Charges:  $Gait Training: 8-22 mins                    G  Codes:      Claretha Cooper 01/01/2016, 4:10 PM

## 2016-01-01 NOTE — Op Note (Deleted)
  The note originally documented on this encounter has been moved the the encounter in which it belongs.  

## 2016-01-01 NOTE — Progress Notes (Signed)
Patient requested all bedtime medications now due to being ready for bed and wants to to sleep. Nurse explained she can not give amitriptyline and xanax together. Patient explains she takes them together at home at bedtime. Patient requested pain medication. Nurse questioned pain medication being given with xanax and amitriptyline. Patient reports taking pain medication, amitriptyline and xanax together before bed at home. Patient understands nurse does not give all of the medications together due to possibility of being over-sedated. Patient reports never having an issue with taking medications together and is requesting medications at this time so she can go to bed.

## 2016-01-01 NOTE — Evaluation (Signed)
Occupational Therapy Evaluation Patient Details Name: [name] [name] MRN: [id number] DOB: October 26, 1955 Today's Date: 01/01/2016    History of Present Illness Pt s/p R TKR and with hx of L TKR (08)    Clinical Impression   Pt is s/p TKA resulting in the deficits listed below (see OT Problem List).  Pt will benefit from skilled OT to increase their safety and independence with ADL and functional mobility for ADL to facilitate discharge to venue listed below.        Follow Up Recommendations  No OT follow up    Equipment Recommendations  None recommended by OT    Recommendations for Other Services       Precautions / Restrictions Precautions Precautions: Knee;Fall Required Braces or Orthoses: Knee Immobilizer - Right Knee Immobilizer - Right: Discontinue once straight leg raise with < 10 degree lag Restrictions Other Position/Activity Restrictions: WBAT      Mobility Bed Mobility Overal bed mobility: Needs Assistance Bed Mobility: Supine to Sit     Supine to sit: Mod assist     General bed mobility comments: patient declined, just walked to BR  Transfers Overall transfer level: Needs assistance Equipment used: Rolling walker (2 wheeled) Transfers: Sit to/from [facility] Sit to Stand: Min assist         General transfer comment: cues for LE managment and use of UEs to self assist    Balance                                            ADL Overall ADL's : Needs assistance/impaired             Lower Body Bathing: Maximal assistance;Cueing for sequencing;Cueing for safety;Sit to/from stand Lower Body Bathing Details (indicate cue type and reason): pt will need AE     Lower Body Dressing: Maximal assistance;Cueing for safety;Cueing for sequencing;Sit to/from stand Lower Body Dressing Details (indicate cue type and reason): pt will need AE Toilet Transfer: Minimal assistance;RW;Ambulation;Comfort height  toilet;Cueing for sequencing;Cueing for safety Toilet Transfer Details (indicate cue type and reason): pt needed increased time Toileting- Clothing Manipulation and Hygiene: Moderate assistance;Sit to/from stand;Cueing for safety;Cueing for sequencing                         Pertinent Vitals/Pain Pain Score: 6  Pain Location: r knee Pain Descriptors / Indicators: Aching;Tightness Pain Intervention(s): Monitored during session;Repositioned;Limited activity within patient's tolerance;Patient requesting pain meds-RN notified     Hand Dominance     Extremity/Trunk Assessment Upper Extremity Assessment Upper Extremity Assessment: Overall WFL for tasks assessed           Communication Communication Communication: No difficulties   Cognition Arousal/Alertness: Awake/alert Behavior During Therapy: WFL for tasks assessed/performed Overall Cognitive Status: Within Functional Limits for tasks assessed                                Home Living Family/patient expects to be discharged to:: Private residence Living Arrangements: Spouse/significant other Available Help at Discharge: Family Type of Home: House Home Access: Stairs to enter Technical brewer of Steps: 2 Entrance Stairs-Rails: None (Has pull handle on the wall) Home Layout: One level     Bathroom Shower/Tub: Walk-in shower;Door   [employer] Toilet: Standard     Home  Equipment: Gilford Rile - 2 wheels;Crutches;Wheelchair - manual          Prior Functioning/Environment Level of Independence: Independent                 OT Problem List: Decreased strength;Pain   OT Treatment/Interventions: DME and/or AE instruction;Patient/family education    OT Goals(Current goals can be found in the care plan section) Acute Rehab OT Goals Patient Stated Goal: did not state OT Goal Formulation: With patient Time For Goal Achievement: 01/08/16 Potential to Achieve Goals: Good  OT Frequency: Min  2X/week   Barriers to D/C:            Co-evaluation              End of Session Nurse Communication: Mobility status  Activity Tolerance: Patient tolerated treatment well Patient left: in chair;with call bell/phone within reach   Time: 1125-1213 OT Time Calculation (min): 48 min Charges:  OT General Charges $OT Visit: 1 Procedure OT Evaluation $OT Eval Moderate Complexity: 1 Procedure OT Treatments $Self Care/Home Management : 23-37 mins G-Codes:    Payton Mccallum D 01/06/2016, 12:23 PM

## 2016-01-01 NOTE — Progress Notes (Signed)
Physical Therapy Treatment Patient Details Name: LIAN VAUX MRN: [id number] DOB: January 15, 1956 Today's Date: 01/01/2016    History of Present Illness Pt s/p R TKR and with hx of L TKR (08)     PT Comments    Patient c/o pain. Recently ambulated to BR. Wants to rest. Will return to ambulate .   Follow Up Recommendations  Home health PT     Equipment Recommendations  None recommended by PT    Recommendations for Other Services       Precautions / Restrictions Precautions Precautions: Knee;Fall Required Braces or Orthoses: Knee Immobilizer - Right Knee Immobilizer - Right: Discontinue once straight leg raise with < 10 degree lag Restrictions Weight Bearing Restrictions: No    Mobility  Bed Mobility               General bed mobility comments: patient declined, just walked to [facility]  Transfers                    Ambulation/Gait                 Stairs            Wheelchair Mobility    Modified Rankin (Stroke Patients Only)       Balance                                    Cognition Arousal/Alertness: Awake/alert                          Exercises Total Joint Exercises Ankle Circles/Pumps: AROM;Both;10 reps Quad Sets: AROM;Both;10 reps Heel Slides: AAROM;Right;10 reps Hip ABduction/ADduction: AAROM;Right;10 reps Straight Leg Raises: AAROM;Right;10 reps    General Comments        Pertinent Vitals/Pain Pain Score: 9  Pain Location: Right knee Pain Descriptors / Indicators: Aching;Throbbing;Tightness Pain Intervention(s): Monitored during session;Premedicated before session;Ice applied    Home Living Family/patient expects to be discharged to:: Private residence                    Prior Function            PT Goals (current goals can now be found in the care plan section) Progress towards PT goals: Progressing toward goals    Frequency    7X/week      PT Plan Current plan  remains appropriate    Co-evaluation             End of Session Equipment Utilized During Treatment: Right knee immobilizer Activity Tolerance: Patient tolerated treatment well Patient left: in bed;with call bell/phone within reach     Time: 0956-1009 PT Time Calculation (min) (ACUTE ONLY): 13 min  Charges:  $Therapeutic Exercise: 8-22 mins                    G Codes:      Claretha Cooper 01/01/2016, 10:12 AM

## 2016-01-02 NOTE — Progress Notes (Signed)
   01/02/16 1500  PT Visit Information  Last PT Received On 01/02/16  Assistance Needed +1  History of Present Illness Pt s/p right TKR and with hx of Lt TKR (08)   Subjective Data  Patient Stated Goal get back to bed  Precautions  Precautions Knee;Fall  Precaution Comments educated on knee precautions  Required Braces or Orthoses Knee Immobilizer - Right  Knee Immobilizer - Right On when out of bed or walking;Discontinue once straight leg raise with < 10 degree lag  Restrictions  Weight Bearing Restrictions No  RLE Weight Bearing WBAT  Pain Assessment  Pain Assessment 0-10  Pain Score 9  Pain Location R knee  Pain Descriptors / Indicators Aching;Grimacing;Guarding  Pain Intervention(s) Limited activity within patient's tolerance;Monitored during session;Patient requesting pain meds-RN notified;Ice applied  Cognition  Arousal/Alertness Awake/alert  Behavior During Therapy WFL for tasks assessed/performed  Overall Cognitive Status Within Functional Limits for tasks assessed  Bed Mobility  Overal bed mobility Needs Assistance  Bed Mobility Supine to Sit  General bed mobility comments (declines OOB d/t pain)  Total Joint Exercises  Ankle Circles/Pumps AROM;Both;10 reps  Quad Sets AROM;Both;10 reps  Heel Slides AAROM;Right;10 reps;Limitations  Hip ABduction/ADduction AAROM;Right;10 reps  Straight Leg Raises AAROM;Right;10 reps  Heel Slides Limitations pain   PT - End of Session  Equipment Utilized During Treatment Right knee immobilizer;Gait belt  Activity Tolerance Patient tolerated treatment well  Patient left with call bell/phone within reach;with family/visitor present;in bed;with bed alarm set  Nurse Communication Mobility status;Patient requests pain meds  PT - Assessment/Plan  PT Plan Current plan remains appropriate  PT Frequency (ACUTE ONLY) 7X/week  Follow Up Recommendations Home health PT  PT equipment None recommended by PT  PT Goal Progression  Progress towards  PT goals Progressing toward goals (slowly)  Acute Rehab PT Goals  PT Goal Formulation With patient  Time For Goal Achievement 01/04/16  Potential to Achieve Goals Good  PT Time Calculation  PT Start Time (ACUTE ONLY) 1414  PT Stop Time (ACUTE ONLY) 1428  PT Time Calculation (min) (ACUTE ONLY) 14 min  PT General Charges  $$ ACUTE PT VISIT 1 Procedure

## 2016-01-02 NOTE — Progress Notes (Signed)
Physical Therapy Treatment Patient Details Name: [name] [name] MRN: [id number] DOB: 18-Dec-1955 Today's Date: 01/02/2016    History of Present Illness Pt s/p right TKR and with hx of Lt TKR (08)     PT Comments    Progress slowly, encouraged OOB  Follow Up Recommendations  Home health PT     Equipment Recommendations  None recommended by PT    Recommendations for Other Services       Precautions / Restrictions Precautions Precautions: Knee;Fall Precaution Booklet Issued: No Precaution Comments: educated on knee precautions Required Braces or Orthoses: Knee Immobilizer - Right Knee Immobilizer - Right: On when out of bed or walking;Discontinue once straight leg raise with < 10 degree lag Restrictions Weight Bearing Restrictions: No RLE Weight Bearing: Weight bearing as tolerated    Mobility  Bed Mobility Overal bed mobility: Needs Assistance Bed Mobility: Supine to Sit     Supine to sit: Mod assist     General bed mobility comments:  (OOB with OT)  Transfers Overall transfer level: Needs assistance Equipment used: Rolling walker (2 wheeled) Transfers: Sit to/from Stand Sit to Stand: Min assist         General transfer comment: assist to boost to stand and assist with RLE prior to sitting.  Ambulation/Gait Ambulation/Gait assistance: Min assist Ambulation Distance (Feet): 42 Feet Assistive device: Rolling walker (2 wheeled) Gait Pattern/deviations: Step-to pattern;Antalgic Gait velocity: decr   General Gait Details: cues for sequence, posture and position from [employer]            Wheelchair Mobility    Modified Rankin (Stroke Patients Only)       Balance                                    Cognition Arousal/Alertness: Awake/alert Behavior During Therapy: Anxious Overall Cognitive Status: Within Functional Limits for tasks assessed                      Exercises Total Joint Exercises Ankle  Circles/Pumps: AROM;Both;10 reps Quad Sets: AROM;Both;10 reps    General Comments        Pertinent Vitals/Pain Pain Assessment: 0-10 Pain Score: 7  Pain Location: R knee and IV site Pain Descriptors / Indicators: Aching;Sore Pain Intervention(s): Limited activity within patient's tolerance;Monitored during session;Ice applied    Home Living                      Prior Function            PT Goals (current goals can now be found in the care plan section) Acute Rehab PT Goals Patient Stated Goal: get back to bed PT Goal Formulation: With patient Time For Goal Achievement: 01/04/16 Potential to Achieve Goals: Good Progress towards PT goals: Progressing toward goals    Frequency    7X/week      PT Plan Current plan remains appropriate    Co-evaluation             End of Session Equipment Utilized During Treatment: Right knee immobilizer;Gait belt Activity Tolerance: Patient tolerated treatment well Patient left: in chair;with call bell/phone within reach;with chair alarm set     Time: [id number] PT Time Calculation (min) (ACUTE ONLY): 15 min  Charges:  $Gait Training: 8-22 mins  G CodesKenyon Ana 01/02/2016, 10:56 AM

## 2016-01-02 NOTE — Progress Notes (Signed)
Orthopedic Tech Progress Note Patient Details:  [name] [name] 1955-08-29 [id number] Patient already has Knee Immobilizer. Patient ID: [name] [name], female   DOB: 1955/06/07, 60 y.o.   MRN: [id number]   Charlott Rakes 01/02/2016, 12:38 PM

## 2016-01-02 NOTE — Progress Notes (Signed)
Report provided to [facility].

## 2016-01-02 NOTE — Progress Notes (Signed)
[name] [name]  MRN: [id number] DOB/Age: August 08, 1955 60 y.o. Physician: Tonita Cong Procedure: Procedure(s) (LRB): RIGHT TOTAL KNEE ARTHROPLASTY (Right)     Subjective: Has had a really tough time with pain control. Feeling better at moment, but rough night. Bladder spasms and pain are resloving with her home med. Itching a little with pain meds  Vital Signs Temp:  [98.4 F (36.9 C)-98.9 F (37.2 C)] 98.9 F (37.2 C) (11/18 0515) Pulse Rate:  [90-110] 110 (11/18 0515) Resp:  [16] 16 (11/18 0515) BP: (155-176)/(73-85) 155/81 (11/18 0515) SpO2:  [94 %-99 %] 98 % (11/18 0515)  Lab Results  Recent Labs  01/01/16 0407  WBC 13.2*  HGB 11.3*  HCT 35.8*  PLT 280   BMET  Recent Labs  01/01/16 0407  NA 137  K 5.2*  CL 108  CO2 24  GLUCOSE 133*  BUN 14  CREATININE 0.75  CALCIUM 8.1*   INR  Date Value Ref Range Status  12/23/2015 1.00  Final     Exam aquacel in place with scant drainage NVI         Plan Mobilize today Anticipate discharge home tomorrow  [facility] PA-C   01/02/2016, 8:51 AM Contact # [phone]

## 2016-01-02 NOTE — Progress Notes (Addendum)
Occupational Therapy Treatment Patient Details Name: [name] [name] MRN: [id number] DOB: 05-01-1955 Today's Date: 01/02/2016    History of present illness Pt s/p right TKR and with hx of Lt TKR (08)    OT comments  Education provided in session. Pt limited by pain. Feel pt will continue to benefit from acute OT.  Follow Up Recommendations  No OT follow up    Equipment Recommendations  None recommended by OT    Recommendations for Other Services      Precautions / Restrictions Precautions Precautions: Knee;Fall Precaution Booklet Issued: No Precaution Comments: educated on knee precautions Required Braces or Orthoses: Knee Immobilizer - Right Knee Immobilizer - Right: Other (comment) (when walking) Restrictions Weight Bearing Restrictions: Yes RLE Weight Bearing: Weight bearing as tolerated       Mobility Bed Mobility Overal bed mobility: Needs Assistance Bed Mobility: Supine to Sit     Supine to sit: Mod assist     General bed mobility comments: assist with RLE and to shift hips around  Transfers Overall transfer level: Needs assistance Equipment used: Rolling walker (2 wheeled) Transfers: Sit to/from Stand Sit to Stand: Min assist         General transfer comment: assist to boost to stand and assist with RLE prior to sitting.    Balance     [place] on RW to take a few steps to chair.                               ADL Overall ADL's : Needs assistance/impaired     Grooming: Set up;Sitting;Oral care                 Lower Body Dressing Details (indicate cue type and reason): unable to doff right sock. Toilet Transfer: Minimal assistance;RW;Ambulation Armed forces technical officer Details (indicate cue type and reason): took a few steps to chair         Functional mobility during ADLs: Minimal assistance;Rolling walker General ADL Comments: Educated on AE and LB dressing technique. Discussed shower transfer techniques. Pt has small shower  and shower chair at home. Pt plans to have someone assist her with LB dressing.      Vision                     Perception     Praxis      Cognition  Awake/Alert Behavior During Therapy: Anxious Overall Cognitive Status: No family/caregiver present to determine baseline cognitive functioning                       Extremity/Trunk Assessment               Exercises     Shoulder Instructions       General Comments      Pertinent Vitals/ Pain       Pain Assessment: 0-10 Pain Score: 7  Pain Location: Rt knee and IV site in hand Pain Descriptors / Indicators: Aching;Throbbing;Burning Pain Intervention(s): Monitored during session;Repositioned;Limited activity within patient's tolerance  Home Living                                          Prior Functioning/Environment              Frequency  Min 2X/week  Progress Toward Goals  OT Goals(current goals can now be found in the care plan section)  Progress towards OT goals: Progressing toward goals  Acute Rehab OT Goals Patient Stated Goal: brush my teeth OT Goal Formulation: With patient Time For Goal Achievement: 01/08/16 Potential to Achieve Goals: Good ADL Goals Pt Will Perform Lower Body Bathing: with set-up;sit to/from stand;with adaptive equipment Pt Will Transfer to Toilet: with set-up;ambulating Pt Will Perform Toileting - Clothing Manipulation and hygiene: with set-up;sit to/from stand;with adaptive equipment Pt Will Perform Tub/Shower Transfer: Shower transfer;with min guard assist (educated on this in session)  Plan Discharge plan remains appropriate    Co-evaluation                 End of Session Equipment Utilized During Treatment: Rolling walker;Gait belt;Right knee immobilizer   Activity Tolerance Patient limited by pain   Patient Left in chair;with call bell/phone within reach   Nurse Communication          Time: [phone] OT  Time Calculation (min): 17 min  Charges: OT General Charges $OT Visit: 1 Procedure OT Treatments $Self Care/Home Management : 8-22 mins  Angela Platner L OTR/L 01/02/2016, 9:24 AM

## 2016-01-03 NOTE — Progress Notes (Addendum)
Physical Therapy Treatment Patient Details Name: [name] [name] MRN: [id number] DOB: 29-Sep-1955 Today's Date: 01/03/2016    History of Present Illness Pt s/p right TKR and with hx of Lt TKR (08)     PT Comments    Pt progressing, pain better controlled today; did well with stairs today; will benefit from HHPT  Follow Up Recommendations  Home health PT     Equipment Recommendations  None recommended by PT    Recommendations for Other Services       Precautions / Restrictions Precautions Precautions: Knee Precaution Comments: Knee immobiliizer, pt instructed in use Restrictions RLE Weight Bearing: Weight bearing as tolerated    Mobility  Bed Mobility Overal bed mobility: Needs Assistance Bed Mobility: Supine to Sit;Sit to Supine     Supine to sit: Min assist;Min guard Sit to supine: Min assist;Min guard   General bed mobility comments: with RLE, partail turn and use of rail to come to full sit  Transfers Overall transfer level: Needs assistance Equipment used: Rolling walker (2 wheeled) Transfers: Sit to/from Stand Sit to Stand: Min guard;Supervision         General transfer comment: min/guard to control descent  Ambulation/Gait Ambulation/Gait assistance: Min guard;Supervision Ambulation Distance (Feet): 60 Feet Assistive device: Rolling walker (2 wheeled) Gait Pattern/deviations: Step-to pattern;Antalgic;Trunk flexed Gait velocity: decr   General Gait Details: cues for sequence, posture and position from RW   Stairs Stairs: Yes Stairs assistance: Min assist Stair Management: No rails;Step to pattern;Backwards;With walker Number of Stairs: 3 General stair comments: cues for technique and sequence  Wheelchair Mobility    Modified Rankin (Stroke Patients Only)       Balance                                    Cognition Arousal/Alertness: Awake/alert Behavior During Therapy: WFL for tasks assessed/performed Overall  Cognitive Status: Within Functional Limits for tasks assessed                      Exercises      General Comments        Pertinent Vitals/Pain Pain Assessment: Faces Faces Pain Scale: Hurts little more Pain Location: R knee Pain Descriptors / Indicators: Constant;Sore Pain Intervention(s): Limited activity within patient's tolerance;Monitored during session;Premedicated before session;Ice applied    Home Living                      Prior Function            PT Goals (current goals can now be found in the care plan section) Acute Rehab PT Goals Patient Stated Goal: get back to bed PT Goal Formulation: With patient Time For Goal Achievement: 01/04/16 Potential to Achieve Goals: Good Progress towards PT goals: Progressing toward goals    Frequency    7X/week      PT Plan Current plan remains appropriate    Co-evaluation             End of Session Equipment Utilized During Treatment: Right knee immobilizer;Gait belt Activity Tolerance: Patient tolerated treatment well Patient left: Other (comment);with call bell/phone within reach (in bathroom, NT advised)     Time: [id number] PT Time Calculation (min) (ACUTE ONLY): 21 min  Charges:  $Gait Training: 8-22 mins                    G  Codes:      [facility] 01/03/2016, 11:28 AM

## 2016-01-03 NOTE — Progress Notes (Signed)
Subjective: 3 Days Post-Op Procedure(s) (LRB): RIGHT TOTAL KNEE ARTHROPLASTY (Right) Patient reports pain as well controlled.  Tolertaing PO's. Progressing with PT. Deneis SOB, CP, or calf pain.  Objective: Vital signs in last 24 hours: Temp:  [98 F (36.7 C)-99.1 F (37.3 C)] 98.5 F (36.9 C) (11/19 0525) Pulse Rate:  [95-103] 103 (11/19 0525) Resp:  [16-20] 20 (11/19 0525) BP: (100-148)/(69-77) 123/77 (11/19 0525) SpO2:  [92 %-98 %] 96 % (11/19 0525)  Intake/Output from previous day: 11/18 0701 - 11/19 0700 In: 360 [P.O.:360] Out: 5 [Urine:5] Intake/Output this shift: No intake/output data recorded.   Recent Labs  01/01/16 0407  HGB 11.3*    Recent Labs  01/01/16 0407  WBC 13.2*  RBC 3.91  HCT 35.8*  PLT 280    Recent Labs  01/01/16 0407  NA 137  K 5.2*  CL 108  CO2 24  BUN 14  CREATININE 0.75  GLUCOSE 133*  CALCIUM 8.1*   No results for input(s): LABPT, INR in the last 72 hours.  Well nourished. Alert and oriented x3. RRR, Lungs clear, BS x4. Abdomen soft and non tender. Right Calf soft and non tender. Right knee dressing C/D/I. No DVT signs. Compartment soft. No signs of infection.  Right LE grossly neurovascular intact.  Assessment/Plan: 3 Days Post-Op Procedure(s) (LRB): RIGHT TOTAL KNEE ARTHROPLASTY (Right) D/c home Up with PT Follow instructions F/u in office with Dr.Beane  Ilisha Blust L 01/03/2016, 7:48 AM

## 2016-01-03 NOTE — Progress Notes (Signed)
Nursing Discharge Summary  Patient ID: CHYSTAL GIRTY MRN: [id number] DOB/AGE: 59-Feb-1957 59 y.o.  Admit date: 12/31/2015 Discharge date: 01/03/2016  Discharged Condition: good  Disposition: 01-Home or Self Care  Follow-up Information    BEANE,JEFFREY C, MD Follow up in 2 week(s).   Specialty:  Orthopedic Surgery Contact information: [address] [place] [facility] [address] [phone]        [facility] Follow up.   Specialty:  Home Health Services Why:  home health physical therapy Contact information: [place] [address] [phone]           Prescriptions Given: Patient given prescriptions.  Patient follow up appointments and medications discussed.  Patient and husband both verbalized understanding without further questions.    Means of Discharge: patient to be taken downstairs via wheelchair to be discharged home via private vehicle.   Signed: Buel Ream 01/03/2016, 1:00 PM

## 2016-01-03 NOTE — Progress Notes (Signed)
Occupational Therapy Treatment Patient Details Name: [name] [name] MRN: [id number] DOB: 05-28-1955 Today's Date: 01/03/2016    History of present illness Pt s/p right TKR and with hx of Lt TKR (08)    OT comments  Pt continues to have pain but is improving with ADL activity  Follow Up Recommendations  No OT follow up    Equipment Recommendations  None recommended by OT       Precautions / Restrictions Precautions Precautions: Knee Precaution Comments: educated on knee precautions Required Braces or Orthoses: Knee Immobilizer - Right Knee Immobilizer - Right: On when out of bed or walking;Discontinue once straight leg raise with < 10 degree lag Restrictions RLE Weight Bearing: Weight bearing as tolerated       Mobility Bed Mobility Overal bed mobility: Needs Assistance Bed Mobility: Supine to Sit;Sit to Supine     Supine to sit: Min assist;Min guard Sit to supine: Min assist;Min guard   General bed mobility comments: pt in chair  Transfers Overall transfer level: Needs assistance Equipment used: Rolling walker (2 wheeled) Transfers: Sit to/from [facility] Sit to Stand: Min assist Stand pivot transfers: Min assist       General transfer comment: min/guard to control descent    Balance                                   ADL Overall ADL's : Needs assistance/impaired             Lower Body Bathing: Moderate assistance;Sit to/from stand;Cueing for safety;Cueing for sequencing       Lower Body Dressing: Moderate assistance;Cueing for safety;Cueing for sequencing   Toilet Transfer: Min guard;RW;Cueing for sequencing;Ambulation   Toileting- Clothing Manipulation and Hygiene: Minimal assistance;Sit to/from stand         General ADL Comments: husband will A as needed         Perception     Praxis      Cognition   Behavior During Therapy: WFL for tasks assessed/performed Overall Cognitive Status: Within  Functional Limits for tasks assessed                               General Comments      Pertinent Vitals/ Pain       Pain Assessment: Faces Pain Score: 4  Faces Pain Scale: Hurts little more Pain Location: r knee Pain Descriptors / Indicators: Sore;Constant Pain Intervention(s): Monitored during session  Home Living                                          Prior Functioning/Environment              Frequency  Min 2X/week        Progress Toward Goals  OT Goals(current goals can now be found in the care plan section)  Progress towards OT goals: Progressing toward goals  Acute Rehab OT Goals Patient Stated Goal: get back to bed  Plan Discharge plan remains appropriate    Co-evaluation                 End of Session Equipment Utilized During Treatment: Rolling walker;Gait belt;Right knee immobilizer   Activity Tolerance Patient limited by pain   Patient Left in chair;with call  bell/phone within reach   Nurse Communication Mobility status        Time: [id number] OT Time Calculation (min): 18 min  Charges: OT General Charges $OT Visit: 1 Procedure OT Treatments $Self Care/Home Management : 8-22 mins  [name] [name], Thereasa Parkin 01/03/2016, 12:43 PM

## 2016-01-04 NOTE — Discharge Summary (Signed)
Physician Discharge Summary   Patient ID: [name] [name] MRN: [id number] DOB/AGE: 15-Jul-1955 60 y.o.  Admit date: 12/31/2015 Discharge date: 01/03/2016  Primary Diagnosis: right knee primary osteoarthritis  Admission Diagnoses:  Past Medical History:  Diagnosis Date  . Allergy   . Anemia    takes iron supplement  . Anxiety   . Arthritis   . 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 Elevated alkaline phosphatase level    negative workup (presumed secondary to fatty liver)  . Frequency-urgency syndrome   . GERD (gastroesophageal reflux disease)   . Interstitial cystitis    Dr Vernie Shanks  . Neuromuscular disorder (HCC)    rt arm carpal tunnel syndrome  . Obesity    status post bariatric procedure in [facility] 2005  . Osteoporosis   . Thyroid nodule 7/10   `  . Thyroid nodule   . Varicose vein of leg    Discharge Diagnoses:   Principal Problem:   Primary osteoarthritis of right knee Active Problems:   Spinal stenosis of lumbar region   S/P TKR (total knee replacement), right  Estimated body mass index is 40.27 kg/m as calculated from the following:   Height as of this encounter: '5\' 5"'  (1.651 m).   Weight as of this encounter: 109.8 kg (242 lb).  Procedure:  Procedure(s) (LRB): RIGHT TOTAL KNEE ARTHROPLASTY (Right)   Consults: None  HPI: see H&P Laboratory Data: Admission on 12/31/2015, Discharged on 01/03/2016  Component Date Value Ref Range Status  . WBC 01/01/2016 13.2* 4.0 - 10.5 K/uL Final  . RBC 01/01/2016 3.91  3.87 - 5.11 MIL/uL Final  . Hemoglobin 01/01/2016 11.3* 12.0 - 15.0 g/dL Final  . HCT 01/01/2016 35.8* 36.0 - 46.0 % Final  . MCV 01/01/2016 91.6  78.0 - 100.0 fL Final  . MCH 01/01/2016 28.9  26.0 - 34.0 pg Final  . MCHC 01/01/2016 31.6  30.0 - 36.0 g/dL Final  . RDW 01/01/2016 14.2  11.5 - 15.5 % Final  . Platelets 01/01/2016 280  150 - 400 K/uL Final  . Sodium 01/01/2016 137  135 - 145 mmol/L Final  . Potassium 01/01/2016 5.2* 3.5 - 5.1 mmol/L  Final  . Chloride 01/01/2016 108  101 - 111 mmol/L Final  . CO2 01/01/2016 24  22 - 32 mmol/L Final  . Glucose, Bld 01/01/2016 133* 65 - 99 mg/dL Final  . BUN 01/01/2016 14  6 - 20 mg/dL Final  . Creatinine, Ser 01/01/2016 0.75  0.44 - 1.00 mg/dL Final  . Calcium 01/01/2016 8.1* 8.9 - 10.3 mg/dL Final  . GFR calc non Af Amer 01/01/2016 >60  >60 mL/min Final  . GFR calc Af Amer 01/01/2016 >60  >60 mL/min Final   Comment: (NOTE) The eGFR has been calculated using the CKD EPI equation. This calculation has not been validated in all clinical situations. eGFR's persistently <60 mL/min signify possible Chronic Kidney Disease.   Georgiann Hahn gap 01/01/2016 5  5 - 15 Final  Hospital Outpatient Visit on 12/23/2015  Component Date Value Ref Range Status  . MRSA, PCR 12/23/2015 NEGATIVE  NEGATIVE Final  . Staphylococcus aureus 12/23/2015 POSITIVE* NEGATIVE Final   Comment:        The Xpert SA Assay (FDA approved for NASAL specimens in patients over 73 years of age), is one component of a comprehensive surveillance program.  Test performance has been validated by [facility] for patients greater than or equal to 24 year old. It is not intended to diagnose infection  nor to guide or monitor treatment.   Marland Kitchen aPTT 12/23/2015 26  24 - 36 seconds Final  . Prothrombin Time 12/23/2015 13.2  11.4 - 15.2 seconds Final  . INR 12/23/2015 1.00   Final  . ABO/RH(D) 12/31/2015 O POS   Final  . Antibody Screen 12/31/2015 NEG   Final  . Sample Expiration 12/31/2015 01/03/2016   Final  . Extend sample reason 12/31/2015 NO TRANSFUSIONS OR PREGNANCY IN THE PAST 3 MONTHS   Final  . Color, Urine 12/23/2015 YELLOW  YELLOW Final  . APPearance 12/23/2015 CLEAR  CLEAR Final  . Specific Gravity, Urine 12/23/2015 1.009  1.005 - 1.030 Final  . pH 12/23/2015 6.0  5.0 - 8.0 Final  . Glucose, UA 12/23/2015 NEGATIVE  NEGATIVE mg/dL Final  . Hgb urine dipstick 12/23/2015 NEGATIVE  NEGATIVE Final  . Bilirubin Urine  12/23/2015 NEGATIVE  NEGATIVE Final  . Ketones, ur 12/23/2015 NEGATIVE  NEGATIVE mg/dL Final  . Protein, ur 12/23/2015 NEGATIVE  NEGATIVE mg/dL Final  . Nitrite 12/23/2015 NEGATIVE  NEGATIVE Final  . Leukocytes, UA 12/23/2015 NEGATIVE  NEGATIVE Final  Office Visit on 12/16/2015  Component Date Value Ref Range Status  . Sodium 12/16/2015 139  135 - 145 mEq/L Final  . Potassium 12/16/2015 3.9  3.5 - 5.1 mEq/L Final  . Chloride 12/16/2015 107  96 - 112 mEq/L Final  . CO2 12/16/2015 25  19 - 32 mEq/L Final  . Glucose, Bld 12/16/2015 101* 70 - 99 mg/dL Final  . BUN 12/16/2015 20  6 - 23 mg/dL Final  . Creatinine, Ser 12/16/2015 0.47  0.40 - 1.20 mg/dL Final  . Calcium 12/16/2015 8.9  8.4 - 10.5 mg/dL Final  . GFR 12/16/2015 143.84  >60.00 mL/min Final  . WBC 12/16/2015 7.0  4.0 - 10.5 K/uL Final  . RBC 12/16/2015 4.32  3.87 - 5.11 Mil/uL Final  . Hemoglobin 12/16/2015 12.3  12.0 - 15.0 g/dL Final  . HCT 12/16/2015 37.3  36.0 - 46.0 % Final  . MCV 12/16/2015 86.3  78.0 - 100.0 fl Final  . MCHC 12/16/2015 32.9  30.0 - 36.0 g/dL Final  . RDW 12/16/2015 13.9  11.5 - 15.5 % Final  . Platelets 12/16/2015 309.0  150.0 - 400.0 K/uL Final  . Neutrophils Relative % 12/16/2015 63.1  43.0 - 77.0 % Final  . Lymphocytes Relative 12/16/2015 27.3  12.0 - 46.0 % Final  . Monocytes Relative 12/16/2015 5.6  3.0 - 12.0 % Final  . Eosinophils Relative 12/16/2015 3.4  0.0 - 5.0 % Final  . Basophils Relative 12/16/2015 0.6  0.0 - 3.0 % Final  . Neutro Abs 12/16/2015 4.4  1.4 - 7.7 K/uL Final  . Lymphs Abs 12/16/2015 1.9  0.7 - 4.0 K/uL Final  . Monocytes Absolute 12/16/2015 0.4  0.1 - 1.0 K/uL Final  . Eosinophils Absolute 12/16/2015 0.2  0.0 - 0.7 K/uL Final  . Basophils Absolute 12/16/2015 0.0  0.0 - 0.1 K/uL Final  . Total Bilirubin 12/16/2015 0.4  0.2 - 1.2 mg/dL Final  . Bilirubin, Direct 12/16/2015 0.1  0.0 - 0.3 mg/dL Final  . Alkaline Phosphatase 12/16/2015 80  39 - 117 U/L Final  . AST 12/16/2015  16  0 - 37 U/L Final  . ALT 12/16/2015 15  0 - 35 U/L Final  . Total Protein 12/16/2015 6.9  6.0 - 8.3 g/dL Final  . Albumin 12/16/2015 4.0  3.5 - 5.2 g/dL Final  . Cholesterol 12/16/2015 147  0 - 200 mg/dL  Final  . Triglycerides 12/16/2015 96.0  0.0 - 149.0 mg/dL Final  . HDL 12/16/2015 50.60  >39.00 mg/dL Final  . VLDL 12/16/2015 19.2  0.0 - 40.0 mg/dL Final  . LDL Cholesterol 12/16/2015 77  0 - 99 mg/dL Final  . Total CHOL/HDL Ratio 12/16/2015 3   Final  . NonHDL 12/16/2015 96.22   Final  . TSH 12/16/2015 0.61  0.35 - 4.50 uIU/mL Final  . Color, Urine 12/16/2015 YELLOW  Yellow;Lt. Yellow Final  . APPearance 12/16/2015 CLEAR  Clear Final  . Specific Gravity, Urine 12/16/2015 1.025  1.000 - 1.030 Final  . pH 12/16/2015 5.5  5.0 - 8.0 Final  . Total Protein, Urine 12/16/2015 NEGATIVE  Negative Final  . Urine Glucose 12/16/2015 NEGATIVE  Negative Final  . Ketones, ur 12/16/2015 NEGATIVE  Negative Final  . Bilirubin Urine 12/16/2015 NEGATIVE  Negative Final  . Hgb urine dipstick 12/16/2015 NEGATIVE  Negative Final  . Urobilinogen, UA 12/16/2015 0.2  0.0 - 1.0 Final  . Leukocytes, UA 12/16/2015 NEGATIVE  Negative Final  . Nitrite 12/16/2015 NEGATIVE  Negative Final  . WBC, UA 12/16/2015 0-2/hpf  0-2/hpf Final  . RBC / HPF 12/16/2015 0-2/hpf  0-2/hpf Final  . Mucus, UA 12/16/2015 Presence of* None Final  . Squamous Epithelial / LPF 12/16/2015 Rare(0-4/hpf)  Rare(0-4/hpf) Final  . Bacteria, UA 12/16/2015 Rare(<10/hpf)* None Final  . Ca Oxalate Crys, UA 12/16/2015 Presence of* None Final  . VITD 12/16/2015 52.88  30.00 - 100.00 ng/mL Final  . Iron 12/16/2015 47  42 - 145 ug/dL Final     X-Rays:Dg Knee Right Port  Result Date: 12/31/2015 CLINICAL DATA:  Status post total right knee arthroplasty. EXAM: PORTABLE RIGHT KNEE - 1-2 VIEW COMPARISON:  None in PACs FINDINGS: The patient has undergone total right knee joint replacement. Radiographic positioning of the prosthetic components is  good. The interface with the native bone appears normal. No acute native bone abnormality is observed. There is air in fluid in the anterior soft tissues of the knee. Surgical skin staples are present. IMPRESSION: Status post right total knee prosthesis placement without evidence of immediate complication. Electronically Signed   By: David  Martinique M.D.   On: 12/31/2015 11:02    EKG: Orders placed or performed in visit on 12/16/15  . EKG 12-Lead     Hospital Course: [name] [name] is a 60 y.o. who was admitted to [facility]. They were brought to the operating room on 12/31/2015 and underwent Procedure(s): RIGHT TOTAL KNEE ARTHROPLASTY.  Patient tolerated the procedure well and was later transferred to the recovery room and then to the orthopaedic floor for postoperative care.  They were given PO and IV analgesics for pain control following their surgery.  They were given 24 hours of postoperative antibiotics of  Anti-infectives    Start     Dose/Rate Route Frequency Ordered Stop   12/31/15 1600  clindamycin (CLEOCIN) IVPB 900 mg     900 mg 100 mL/hr over 30 Minutes Intravenous Every 8 hours 12/31/15 1204 12/31/15 1625   12/31/15 1500  ceFAZolin (ANCEF) IVPB 2g/100 mL premix     2 g 200 mL/hr over 30 Minutes Intravenous Every 8 hours 12/31/15 1204 01/01/16 0728   12/31/15 0811  polymyxin B 500,000 Units, bacitracin 50,000 Units in sodium chloride irrigation 0.9 % 500 mL irrigation  Status:  Discontinued       As needed 12/31/15 0817 12/31/15 1006   12/31/15 0615  clindamycin (CLEOCIN) IVPB 900 mg  900 mg 100 mL/hr over 30 Minutes Intravenous On call to O.R. 12/31/15 0603 12/31/15 0744   12/31/15 0615  ceFAZolin (ANCEF) IVPB 2g/100 mL premix     2 g 200 mL/hr over 30 Minutes Intravenous On call to O.R. 12/31/15 0603 12/31/15 0732     and started on DVT prophylaxis in the form of Xarelto, TED hose and SCDs.   PT and OT were ordered for total joint protocol.  Discharge planning  consulted to help with postop disposition and equipment needs.  Patient had a fair night on the evening of surgery.  They started to get up OOB with therapy on day one. Continued to work with therapy into day two. By day three, the patient had progressed with therapy and meeting their goals.  Incision was healing well.  Patient was seen in rounds and was ready to go home.   Diet: Regular diet Activity:WBAT Follow-up:today Disposition - Home Discharged Condition: good      Medication List    STOP taking these medications   BIOTIN PO   Fish Oil 1000 MG Caps   HYDROcodone-acetaminophen 5-325 MG tablet Commonly known as:  NORCO/VICODIN   multivitamin tablet   VITAMIN A PO     TAKE these medications   acetaminophen 650 MG CR tablet Commonly known as:  TYLENOL Take 2 tablets (1,300 mg total) by mouth every 8 (eight) hours as needed for pain. Take for mild pain. Do not combine with Percocet. Do not exceed daily recommended limit of Tylenol. What changed:  additional instructions   ALPRAZolam 0.5 MG tablet Commonly known as:  XANAX Take 1 tablet (0.5 mg total) by mouth 2 (two) times daily. What changed:  Another medication with the same name was removed. Continue taking this medication, and follow the directions you see here.   amitriptyline 50 MG tablet Commonly known as:  ELAVIL TAKE 1 TABLET BY MOUTH EVERY EVENING   azelastine 0.1 % nasal spray Commonly known as:  ASTELIN Place 1 spray into both nostrils daily. Use in each nostril as directed   cycloSPORINE 0.05 % ophthalmic emulsion Commonly known as:  RESTASIS Place 1 drop into both eyes 2 (two) times daily.   docusate sodium 100 MG capsule Commonly known as:  COLACE Take 1 capsule (100 mg total) by mouth 2 (two) times daily as needed for mild constipation.   ferrous sulfate 325 (65 FE) MG tablet Take 1 tablet (325 mg total) by mouth 2 (two) times daily. What changed:  additional instructions   fexofenadine 180  MG tablet Commonly known as:  ALLEGRA Take 180 mg by mouth every evening.   methocarbamol 500 MG tablet Commonly known as:  ROBAXIN Take 1 tablet (500 mg total) by mouth every 6 (six) hours as needed for muscle spasms.   MYRBETRIQ 50 MG Tb24 tablet Generic drug:  mirabegron ER Take 50 mg by mouth every evening.   omeprazole 40 MG capsule Commonly known as:  PRILOSEC TAKE 1 CAPSULE (40 MG TOTAL) BY MOUTH DAILY. What changed:  when to take this   [place] Proper 1 drop into both eyes 2 (two) times daily. Allergy eye drops   oxyCODONE-acetaminophen 10-325 MG tablet Commonly known as:  PERCOCET Take 1 tablet by mouth every 4 (four) hours as needed for pain (severe).   PARoxetine 30 MG tablet Commonly known as:  PAXIL TAKE 1 TABLET BY MOUTH EVERY MORNING What changed:  See the new instructions.   phenazopyridine 200 MG tablet Commonly known  as:  PYRIDIUM Take 200 mg by mouth every 6 (six) hours as needed for pain.   polyethylene glycol packet Commonly known as:  MIRALAX / GLYCOLAX Take 17 g by mouth daily.   rivaroxaban 10 MG Tabs tablet Commonly known as:  XARELTO Take 1 tablet (10 mg total) by mouth daily.   TUMS ULTRA 1000 PO Take 1,000 mg by mouth 2 (two) times daily.   valACYclovir 500 MG tablet Commonly known as:  VALTREX TAKE 1 TABLET BY MOUTH EVERY DAY. NEEDS APPT   Vitamin D (Ergocalciferol) 50000 units Caps capsule Commonly known as:  DRISDOL TAKE 1 CAPSULE (50,000 UNITS TOTAL) BY MOUTH 2 (TWO) TIMES A WEEK.      Follow-up Information    BEANE,JEFFREY C, MD Follow up in 2 week(s).   Specialty:  Orthopedic Surgery Contact information: [address]  [place] [address] [phone]        [facility] Follow up.   Specialty:  [place] Why:  home health physical therapy Contact information: [address] [place] [address] [phone]           Signed: Lacie Draft,  PA-C Orthopaedic Surgery 01/04/2016, 8:39 AM

## 2016-01-20 ENCOUNTER — Ambulatory Visit: Payer: BLUE CROSS/BLUE SHIELD | Attending: Specialist | Admitting: Physical Therapy

## 2016-01-20 DIAGNOSIS — M6281 Muscle weakness (generalized): Secondary | ICD-10-CM

## 2016-01-20 DIAGNOSIS — M25561 Pain in right knee: Secondary | ICD-10-CM | POA: Diagnosis present

## 2016-01-20 DIAGNOSIS — R262 Difficulty in walking, not elsewhere classified: Secondary | ICD-10-CM

## 2016-01-20 DIAGNOSIS — M25661 Stiffness of right knee, not elsewhere classified: Secondary | ICD-10-CM | POA: Insufficient documentation

## 2016-01-20 DIAGNOSIS — R2689 Other abnormalities of gait and mobility: Secondary | ICD-10-CM | POA: Insufficient documentation

## 2016-01-20 NOTE — Patient Instructions (Signed)
Short Arc [employer] a large can or rolled towel under leg. Straighten knee and leg. Hold ___3-5_ seconds. Repeat with other leg. Repeat __15__ times. Do __2__ sessions per day.    Heel Slides    Slide left heel along bed towards bottom. Hold for _3-5__ seconds. Slide back to flat knee position. Repeat __15_ times. Do _2__ times a day. Repeat with other leg.     Quad Set    With other leg bent, foot flat, slowly tighten muscles on thigh of straight leg.  Repeat __15__ times. Do __2__ sessions per day.   Straight Leg Raise    Tighten stomach and slowly raise locked leg. Repeat __15__ times per set. Do __2__ sets per session.    KNEE: Extension, Long Arc Quads - Sitting    Raise leg until knee is straight. _15__ reps per set, _2__ sets per day

## 2016-01-20 NOTE — Therapy (Signed)
[facility] [address]  [place], [place], [address] Phone: [phone]   Fax:  [phone]  Physical Therapy Evaluation  Patient Details  Name: [name] [name] MRN: [id number] Date of Birth: August 28, 1955 Referring Provider: Dr. [name]  Encounter Date: 01/20/2016      PT End of Session - 01/20/16 1604    Visit Number 1   Number of Visits 16   Date for PT Re-Evaluation 03/16/16   PT Start Time [id number]   PT Stop Time 1530   PT Time Calculation (min) 46 min   Activity Tolerance Patient tolerated treatment well   Behavior During Therapy [facility] for tasks assessed/performed      Past Medical History:  Diagnosis Date  . Allergy   . Anemia    takes iron supplement  . Anxiety   . Arthritis   . 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 Elevated alkaline phosphatase level    negative workup (presumed secondary to fatty liver)  . Frequency-urgency syndrome   . GERD (gastroesophageal reflux disease)   . Interstitial cystitis    Dr Vernie Shanks  . Neuromuscular disorder (HCC)    rt arm carpal tunnel syndrome  . Obesity    status post bariatric procedure in [facility] 2005  . Osteoporosis   . Thyroid nodule 7/10   `  . Thyroid nodule   . Varicose vein of leg     Past Surgical History:  Procedure Laterality Date  . ANTERIOR CERVICAL DECOMP/DISCECTOMY FUSION N/A 06/21/2012   Procedure: ANTERIOR CERVICAL DECOMPRESSION/DISCECTOMY FUSION C-4 - C5 (SPACER/DePUY CERVICAL PLATE ONLY) 1 LEVEL;  Surgeon: Melina Schools, MD;  Location: [facility];  Service: Orthopedics;  Laterality: N/A;  . BLADDER SURGERY  02/21/12 and 02/28/12   Dr Diona Fanti  . COLPOSCOPY  1980  . CRYOTHERAPY  1980  . CYSTO WITH HYDRODISTENSION  12/12/2011   Procedure: CYSTOSCOPY/HYDRODISTENSION;  Surgeon: Franchot Gallo, MD;  Location: [facility];  Service: Urology;  Laterality: N/A;  30 MIN   . EYE SURGERY  1997   bilateral cataract extraction  .  FOOT SURGERY     bilateral bunionettes  . FOOT SURGERY Right 01/07/14   Pt states surgery was due to arthritis. "Has pins in place now"  . GASTRIC BYPASS  2005  . JOINT REPLACEMENT  2008   left total knee  . KNEE SURGERY  1997   left knee  . NASAL SINUS SURGERY  [id number]   Dr Erik Obey  . REFRACTIVE SURGERY  2006   lasik  . SHOULDER SURGERY  2 2012   RIGHT   . STOMACH SURGERY  2007   "tummy tuck"  . TOTAL KNEE ARTHROPLASTY Right 12/31/2015   Procedure: RIGHT TOTAL KNEE ARTHROPLASTY;  Surgeon: [name], MD;  Location: [facility];  Service: Orthopedics;  Laterality: Right;  Adductor Block  . VARICOSE VEIN SURGERY Right 02/2012   leg    There were no vitals filed for this visit.       Subjective Assessment - 01/20/16 1445    Subjective Patient s/p R TKA on . Waited 3 years to have TKA, has "rod" in foot as well as arthritis in foot. Had tried conservative treatment for knee pain - with no success. Has some soreness in her knee currently. Is able to get in/out of bed but sometimes needs assist from pulling on clothes. Had HHPT following surgery for 5 visits.    Limitations Sitting;Standing;Walking   How long can  you sit comfortably? 1 hour   How long can you stand comfortably? 15 minutes   How long can you walk comfortably? 2 hours   Patient Stated Goals reduce pain, improve walking   Currently in Pain? Yes   Pain Score 2    Pain Location Knee   Pain Orientation Right   Pain Descriptors / Indicators Aching  pulling   Pain Type Surgical pain   Pain Onset 1 to 4 weeks ago   Pain Frequency Constant   Aggravating Factors  "using knee"   Pain Relieving Factors pain meds, ice            [facility] PT Assessment - 01/20/16 1452      Assessment   Medical Diagnosis R TKA   Referring Provider Dr. [name]   Onset Date/Surgical Date 12/31/15   Next MD Visit 02/10/16   Prior Therapy yes; other knee     Balance Screen   Has the patient fallen in the past 6 months No   Has the  patient had a decrease in activity level because of a fear of falling?  No   Is the patient reluctant to leave their home because of a fear of falling?  No     Home Social worker Private residence   Living Arrangements Spouse/significant other;Children   Type of [place] to enter   Entrance Stairs-Number of Steps 2   [place] One level   [place] - single point;Walker - 2 wheels     Prior Function   Level of Independence Independent   Vocation Full time employment   Engineer, manufacturing job, up and down   Leisure shopping, caring for grandchildren     Cognition   Overall Cognitive Status Within Functional Limits for tasks assessed     Observation/Other Assessments   Focus on Therapeutic Outcomes (FOTO)  38 (62% limited, predicted 43% limited)     ROM / Strength   AROM / PROM / Strength AROM;PROM;Strength     AROM   AROM Assessment Site Knee   Right/Left Knee Right;Left   Right Knee Extension 1   Right Knee Flexion 78   Left Knee Extension -1   Left Knee Flexion 100     PROM   PROM Assessment Site Knee   Right/Left Knee Right   Right Knee Extension 0   Right Knee Flexion 88     Strength   Strength Assessment Site Hip;Knee   Right/Left Hip Right;Left   Right Hip Flexion 3-/5   Right Hip Extension 3/5   Left Hip Flexion 4/5   Left Hip Extension 3+/5   Right/Left Knee Right;Left   Right Knee Flexion 4+/5   Right Knee Extension 4+/5   Left Knee Flexion 4-/5   Left Knee Extension 3/5                           PT Education - 01/20/16 1603    Education provided Yes   Education Details exam findings, POC, HEP   Person(s) Educated Patient   Methods Explanation;Demonstration;Handout   Comprehension Verbalized understanding;Returned demonstration          PT Short Term Goals - 01/20/16 1605      PT SHORT TERM GOAL #1   Title patient to be independent with HEP (03/16/16)   Status  New     PT SHORT TERM GOAL #2  Title Patient to improve R knee PROM equal to that of L knee (03/16/16)   Status New           PT Long Term Goals - 01/20/16 1627      PT LONG TERM GOAL #1   Title patient to be independent with advacned HEP (03/16/16)   Status New     PT LONG TERM GOAL #2   Title Patient to improve R knee AROM equal to that of L knee (03/16/16)   Status New     PT LONG TERM GOAL #3   Title Patient to improve R LE strength to >/= 4/5 (03/16/16)   Status New     PT LONG TERM GOAL #4   Title Patient to demonstrate normal gait mechanics with good heel strike and toe off without use of AD (03/16/16)   Status New     PT LONG TERM GOAL #5   Title Patient to demonstrate SLR with no extensor lag (03/16/16)   Status New               Plan - 01/20/16 1745    Clinical Impression Statement [name] [name] is a 60 y/o female presenting to [facility] today s/p R TKA on 12/31/15. Patient has been seen by [facility] for 5 visits and is currently ambulating with RW or SPC. Patient today with R knee extension at 1-2 degrees from neutral and flexion at 78 degress actively. Patient today with antalgic gait pattern as well as reduced quad control with SAQ/LAQ, as well as inability to perform SLR without manual assist from PT. Patient to benefit from PT intervention to address the above listed deficts to allow patient to regain strength and ROM needed for proper gait mechanics.    Rehab Potential Excellent   PT Frequency 2x / week   PT Duration 8 weeks   PT Treatment/Interventions ADLs/Self Care Home Management;Cryotherapy;Electrical Stimulation;Moist Heat;Neuromuscular re-education;Balance training;Therapeutic exercise;Therapeutic activities;Functional mobility training;Stair training;Gait training;Patient/family education;Manual techniques;Passive range of motion;Vasopneumatic Device;Taping   PT Next Visit Plan progress R knee strength/ROM   Consulted and Agree with Plan of Care Patient       Patient will benefit from skilled therapeutic intervention in order to improve the following deficits and impairments:  Abnormal gait, Decreased activity tolerance, Decreased balance, Decreased range of motion, Decreased mobility, Decreased strength, Increased fascial restricitons, Pain, Difficulty walking  Visit Diagnosis: Acute pain of right knee - Plan: PT plan of care cert/re-cert  Stiffness of right knee, not elsewhere classified - Plan: PT plan of care cert/re-cert  Difficulty in walking, not elsewhere classified - Plan: PT plan of care cert/re-cert  Muscle weakness (generalized) - Plan: PT plan of care cert/re-cert  Other abnormalities of gait and mobility - Plan: PT plan of care cert/re-cert     Problem List Patient Active Problem List   Diagnosis Date Noted  . Primary osteoarthritis of right knee 12/31/2015  . Spinal stenosis of lumbar region 12/31/2015  . S/P TKR (total knee replacement), right 12/31/2015  . Diverticulitis of intestine without perforation or abscess without bleeding 09/18/2013  . Routine general medical examination at a health care facility 06/28/2013  . Overactive bladder 01/04/2013  . Onychomycosis 09/19/2012  . Right bundle branch block 03/07/2012  . Anemia 03/03/2011  . Rosacea 10/30/2010  . THYROID NODULE, RIGHT 09/10/2008  . THYROID STIMULATING HORMONE, ABNORMAL 09/08/2008  . Vitamin D deficiency 09/08/2008  . ALKALINE PHOSPHATASE, ELEVATED 09/08/2008  . OSTEOPOROSIS 08/21/2008  . BARIATRIC SURGERY STATUS 08/21/2008  . MORBID  OBESITY 02/27/2007  . Anxiety state 02/27/2007  . Essential hypertension 02/27/2007  . HEMORRHOIDS 02/27/2007  . VENOUS INSUFFICIENCY 02/27/2007  . ALLERGIC RHINITIS 02/27/2007  . GERD 02/27/2007  . DEGENERATIVE JOINT DISEASE 02/27/2007      Lanney Gins, PT, DPT 01/20/16 5:54 PM     [facility] [address]  [place], [place],  [address] Phone: [phone]   Fax:  [phone]  Name: [name] [name] MRN: [id number] Date of Birth: 1955-05-17

## 2016-01-21 ENCOUNTER — Ambulatory Visit: Payer: BLUE CROSS/BLUE SHIELD | Admitting: Physical Therapy

## 2016-01-21 DIAGNOSIS — M6281 Muscle weakness (generalized): Secondary | ICD-10-CM

## 2016-01-21 DIAGNOSIS — M25561 Pain in right knee: Secondary | ICD-10-CM

## 2016-01-21 DIAGNOSIS — M25661 Stiffness of right knee, not elsewhere classified: Secondary | ICD-10-CM

## 2016-01-21 DIAGNOSIS — R2689 Other abnormalities of gait and mobility: Secondary | ICD-10-CM

## 2016-01-21 DIAGNOSIS — R262 Difficulty in walking, not elsewhere classified: Secondary | ICD-10-CM

## 2016-01-21 NOTE — Therapy (Signed)
[facility] [address]  [place], [place], [address] Phone: [phone]   Fax:  [phone]  Physical Therapy Treatment  Patient Details  Name: [name] [name] MRN: [id number] Date of Birth: [birth year] Referring Provider: Dr. [name]  Encounter Date: 01/21/2016      PT End of Session - 01/21/16 1405    Visit Number 2   Number of Visits 16   Date for PT Re-Evaluation 03/16/16   PT Start Time 1400   PT Stop Time 1445   PT Time Calculation (min) 45 min   Activity Tolerance Patient tolerated treatment well   Behavior During Therapy [facility] for tasks assessed/performed      Past Medical History:  Diagnosis Date  . Allergy   . Anemia    takes iron supplement  . Anxiety   . Arthritis   . 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 Elevated alkaline phosphatase level    negative workup (presumed secondary to fatty liver)  . Frequency-urgency syndrome   . GERD (gastroesophageal reflux disease)   . Interstitial cystitis    Dr Vernie Shanks  . Neuromuscular disorder (HCC)    rt arm carpal tunnel syndrome  . Obesity    status post bariatric procedure in [facility] 2005  . Osteoporosis   . Thyroid nodule 7/10   `  . Thyroid nodule   . Varicose vein of leg     Past Surgical History:  Procedure Laterality Date  . ANTERIOR CERVICAL DECOMP/DISCECTOMY FUSION N/A 06/21/2012   Procedure: ANTERIOR CERVICAL DECOMPRESSION/DISCECTOMY FUSION C-4 - C5 (SPACER/DePUY CERVICAL PLATE ONLY) 1 LEVEL;  Surgeon: Melina Schools, MD;  Location: [facility];  Service: Orthopedics;  Laterality: N/A;  . BLADDER SURGERY  02/21/12 and 02/28/12   Dr Diona Fanti  . COLPOSCOPY  1980  . CRYOTHERAPY  1980  . CYSTO WITH HYDRODISTENSION  12/12/2011   Procedure: CYSTOSCOPY/HYDRODISTENSION;  Surgeon: Franchot Gallo, MD;  Location[facility];  Service: Urology;  Laterality: N/A;  30 MIN   . EYE SURGERY  1997   bilateral cataract extraction  .  FOOT SURGERY     bilateral bunionettes  . FOOT SURGERY Right 01/07/14   Pt states surgery was due to arthritis. "Has pins in place now"  . GASTRIC BYPASS  2005  . JOINT REPLACEMENT  2008   left total knee  . KNEE SURGERY  1997   left knee  . NASAL SINUS SURGERY  [id number]   Dr Erik Obey  . REFRACTIVE SURGERY  2006   lasik  . SHOULDER SURGERY  2 2012   RIGHT   . STOMACH SURGERY  2007   "tummy tuck"  . TOTAL KNEE ARTHROPLASTY Right 12/31/2015   Procedure: RIGHT TOTAL KNEE ARTHROPLASTY;  Surgeon: [name], MD;  Location: [facility];  Service: Orthopedics;  Laterality: Right;  Adductor Block  . VARICOSE VEIN SURGERY Right 02/2012   leg    There were no vitals filed for this visit.      Subjective Assessment - 01/21/16 1404    Subjective patient today with SPC. Propped foot on pillow last night.    Patient Stated Goals reduce pain, improve walking   Currently in Pain? Yes   Pain Score 5    Pain Location Knee   Pain Orientation Right   Pain Descriptors / Indicators Aching   Pain Type Surgical pain   Pain Onset 1 to 4 weeks ago   Pain Frequency Constant   Aggravating Factors  "  using knee"   Pain Relieving Factors pain meds, ice            [facility] PT Assessment - 01/20/16 1452      Assessment   Medical Diagnosis R TKA   Referring Provider Dr. [name]   Onset Date/Surgical Date 12/31/15   Next MD Visit 02/10/16   Prior Therapy yes; other knee     Balance Screen   Has the patient fallen in the past 6 months No   Has the patient had a decrease in activity level because of a fear of falling?  No   Is the patient reluctant to leave their home because of a fear of falling?  No     Home Social worker Private residence   Living Arrangements Spouse/significant other;Children   Type of [place] to enter   Entrance Stairs-Number of Steps 2   [place] One level   [place] - single point;[place] - 2 wheels     Prior  Function   Level of Independence Independent   Vocation Full time employment   Engineer, manufacturing job, up and down   Leisure shopping, caring for grandchildren     Cognition   Overall Cognitive Status Within Functional Limits for tasks assessed     Observation/Other Assessments   Focus on Therapeutic Outcomes (FOTO)  38 (62% limited, predicted 43% limited)     ROM / Strength   AROM / PROM / Strength AROM;PROM;Strength     AROM   AROM Assessment Site Knee   Right/Left Knee Right;Left   Right Knee Extension 1   Right Knee Flexion 78   Left Knee Extension -1   Left Knee Flexion 100     PROM   PROM Assessment Site Knee   Right/Left Knee Right   Right Knee Extension 0   Right Knee Flexion 88     Strength   Strength Assessment Site Hip;Knee   Right/Left Hip Right;Left   Right Hip Flexion 3-/5   Right Hip Extension 3/5   Left Hip Flexion 4/5   Left Hip Extension 3+/5   Right/Left Knee Right;Left   Right Knee Flexion 4+/5   Right Knee Extension 4+/5   Left Knee Flexion 4-/5   Left Knee Extension 3/5                     [facility] Adult PT Treatment/Exercise - 01/21/16 0001      Exercises   Exercises Knee/Hip     Knee/Hip Exercises: Aerobic   Nustep level 4 x 8 minutes     Knee/Hip Exercises: Standing   Heel Raises Both;15 reps   Hip Abduction Both;15 reps   Hip Extension Right;15 reps     Knee/Hip Exercises: Seated   Long Arc Quad Strengthening;Right;15 reps   Other Seated Knee/Hip Exercises Fitter - 2 blue cords - 2 x 15     Knee/Hip Exercises: Supine   Short Arc Quad Sets Strengthening;Right;2 sets;15 reps   Heel Slides AAROM;Right;15 reps  with strap   Bridges 15 reps   Straight Leg Raises Strengthening;Right;15 reps   Straight Leg Raises Limitations assist needed from PT                PT Education - 01/20/16 1603    Education provided Yes   Education Details exam findings, POC, HEP   Person(s) Educated Patient   Methods  Explanation;Demonstration;Handout   Comprehension Verbalized understanding;Returned  demonstration          PT Short Term Goals - 01/20/16 1605      PT SHORT TERM GOAL #1   Title patient to be independent with HEP (03/16/16)   Status New     PT SHORT TERM GOAL #2   Title Patient to improve R knee PROM equal to that of L knee (03/16/16)   Status New           PT Long Term Goals - 01/20/16 1627      PT LONG TERM GOAL #1   Title patient to be independent with advacned HEP (03/16/16)   Status New     PT LONG TERM GOAL #2   Title Patient to improve R knee AROM equal to that of L knee (03/16/16)   Status New     PT LONG TERM GOAL #3   Title Patient to improve R LE strength to >/= 4/5 (03/16/16)   Status New     PT LONG TERM GOAL #4   Title Patient to demonstrate normal gait mechanics with good heel strike and toe off without use of AD (03/16/16)   Status New     PT LONG TERM GOAL #5   Title Patient to demonstrate SLR with no extensor lag (03/16/16)   Status New               Plan - 01/21/16 1635    Clinical Impression Statement Patient returning to PT for first treatment session following initial evaluation. Patient conitnuing to ambulate with SPC to facility but chooses to ambulate within facility with no AD. PT session focusing on quad activation and control with patient able to perform LAQ/SAQ but unable to achieve full knee extension demonstrating end-range quad weakness. SLR in supine still very difficult for patient requiring manual assist from PT. Patient to continue to benefit from PT to maximize function and mobility.    PT Treatment/Interventions ADLs/Self Care Home Management;Cryotherapy;Electrical Stimulation;Moist Heat;Neuromuscular re-education;Balance training;Therapeutic exercise;Therapeutic activities;Functional mobility training;Stair training;Gait training;Patient/family education;Manual techniques;Passive range of motion;Vasopneumatic Device;Taping   PT  Next Visit Plan progress R knee strength/ROM   Consulted and Agree with Plan of Care Patient      Patient will benefit from skilled therapeutic intervention in order to improve the following deficits and impairments:  Abnormal gait, Decreased activity tolerance, Decreased balance, Decreased range of motion, Decreased mobility, Decreased strength, Increased fascial restricitons, Pain, Difficulty walking  Visit Diagnosis: Acute pain of right knee  Stiffness of right knee, not elsewhere classified  Difficulty in walking, not elsewhere classified  Muscle weakness (generalized)  Other abnormalities of gait and mobility     Problem List Patient Active Problem List   Diagnosis Date Noted  . Primary osteoarthritis of right knee 12/31/2015  . Spinal stenosis of lumbar region 12/31/2015  . S/P TKR (total knee replacement), right 12/31/2015  . Diverticulitis of intestine without perforation or abscess without bleeding 09/18/2013  . Routine general medical examination at a health care facility 06/28/2013  . Overactive bladder 01/04/2013  . Onychomycosis 09/19/2012  . Right bundle branch block 03/07/2012  . Anemia 03/03/2011  . Rosacea 10/30/2010  . THYROID NODULE, RIGHT 09/10/2008  . THYROID STIMULATING HORMONE, ABNORMAL 09/08/2008  . Vitamin D deficiency 09/08/2008  . ALKALINE PHOSPHATASE, ELEVATED 09/08/2008  . OSTEOPOROSIS 08/21/2008  . BARIATRIC SURGERY STATUS 08/21/2008  . MORBID OBESITY 02/27/2007  . Anxiety state 02/27/2007  . Essential hypertension 02/27/2007  . HEMORRHOIDS 02/27/2007  . VENOUS INSUFFICIENCY 02/27/2007  .  ALLERGIC RHINITIS 02/27/2007  . GERD 02/27/2007  . DEGENERATIVE JOINT DISEASE 02/27/2007       Lanney Gins, PT, DPT 01/21/16 4:40 PM     [facility] [address]  [place], [place], [address] Phone: [phone]   Fax:  [phone]  Name: [name] [name] MRN:  [id number] Date of Birth: [birth year]

## 2016-01-26 ENCOUNTER — Ambulatory Visit: Payer: BLUE CROSS/BLUE SHIELD

## 2016-01-26 DIAGNOSIS — M25661 Stiffness of right knee, not elsewhere classified: Secondary | ICD-10-CM

## 2016-01-26 DIAGNOSIS — R2689 Other abnormalities of gait and mobility: Secondary | ICD-10-CM

## 2016-01-26 DIAGNOSIS — M25561 Pain in right knee: Secondary | ICD-10-CM | POA: Diagnosis not present

## 2016-01-26 DIAGNOSIS — R262 Difficulty in walking, not elsewhere classified: Secondary | ICD-10-CM

## 2016-01-26 DIAGNOSIS — M6281 Muscle weakness (generalized): Secondary | ICD-10-CM

## 2016-01-26 NOTE — Therapy (Signed)
Dictation #1 [id number]  [facility] [place], [place], [address] Phone: [phone]   Fax:  [phone]  Physical Therapy Treatment  Patient Details  Name: [name] [name] MRN: [id number] Date of Birth: [birth year] Referring Provider: Dr. [name]   Encounter Date: 01/26/2016      PT End of Session - 01/26/16 1436    Visit Number 3   Number of Visits 16   Date for PT Re-Evaluation 03/16/16   PT Start Time [id number]  Pt. arrived late    PT Stop Time 1450   PT Time Calculation (min) 45 min   Activity Tolerance Patient tolerated treatment well   Behavior During Therapy WFL for tasks assessed/performed      Past Medical History:  Diagnosis Date  . Allergy   . Anemia    takes iron supplement  . Anxiety   . Arthritis   . 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 Elevated alkaline phosphatase level    negative workup (presumed secondary to fatty liver)  . Frequency-urgency syndrome   . GERD (gastroesophageal reflux disease)   . Interstitial cystitis    Dr Vernie Shanks  . Neuromuscular disorder (HCC)    rt arm carpal tunnel syndrome  . Obesity    status post bariatric procedure in [facility] 2005  . Osteoporosis   . Thyroid nodule 7/10   `  . Thyroid nodule   . Varicose vein of leg     Past Surgical History:  Procedure Laterality Date  . ANTERIOR CERVICAL DECOMP/DISCECTOMY FUSION N/A 06/21/2012   Procedure: ANTERIOR CERVICAL DECOMPRESSION/DISCECTOMY FUSION C-4 - C5 (SPACER/DePUY CERVICAL PLATE ONLY) 1 LEVEL;  Surgeon: Melina Schools, MD;  Location: [place];  Service: Orthopedics;  Laterality: N/A;  . BLADDER SURGERY  02/21/12 and 02/28/12   Dr Diona Fanti  . COLPOSCOPY  1980  . CRYOTHERAPY  1980  . CYSTO WITH HYDRODISTENSION  12/12/2011   Procedure: CYSTOSCOPY/HYDRODISTENSION;  Surgeon: Franchot Gallo, MD;  Location: [facility];  Service: Urology;  Laterality: N/A;  30  MIN   . EYE SURGERY  1997   bilateral cataract extraction  . FOOT SURGERY     bilateral bunionettes  . FOOT SURGERY Right 01/07/14   Pt states surgery was due to arthritis. "Has pins in place now"  . GASTRIC BYPASS  2005  . JOINT REPLACEMENT  2008   left total knee  . KNEE SURGERY  1997   left knee  . NASAL SINUS SURGERY  [id number]   Dr Erik Obey  . REFRACTIVE SURGERY  2006   lasik  . SHOULDER SURGERY  2 2012   RIGHT   . STOMACH SURGERY  2007   "tummy tuck"  . TOTAL KNEE ARTHROPLASTY Right 12/31/2015   Procedure: RIGHT TOTAL KNEE ARTHROPLASTY;  Surgeon: [name], MD;  Location: [facility];  Service: Orthopedics;  Laterality: Right;  Adductor Block  . VARICOSE VEIN SURGERY Right 02/2012   leg    There were no vitals filed for this visit.      Subjective Assessment - 01/26/16 1415    Subjective Pt. reporting she left the [facility] at home today.     Patient Stated Goals reduce pain, improve walking   Currently in Pain? Yes   Pain Score 4    Pain Location Knee   Pain Orientation Right   Pain Descriptors / Indicators Aching   Multiple Pain Sites No  [facility] PT Assessment - 01/26/16 1409      Assessment   Referring Provider Dr. [name]    Next MD Visit 02/12/16                     [facility] Adult PT Treatment/Exercise - 01/26/16 1426      Ambulation/Gait   Ambulation/Gait Yes   Ambulation/Gait Assistance 5: Supervision   Ambulation/Gait Assistance Details Pt. able to ambulate with SPC on L and step through pattern however requiring mod cues for proper sequencing, upright posture, and cane placement; pt. with tendancy to lose sequence with conversation   Ambulation Distance (Feet) 500 Feet   Assistive device Straight cane   Gait Pattern Step-through pattern;Decreased arm swing - right;Decreased stance time - right;Decreased step length - left;Decreased stride length;Decreased hip/knee flexion - right;Decreased weight shift to right;Right  circumduction;Trendelenburg   Ambulation Surface Indoor;Level     Knee/Hip Exercises: Aerobic   Nustep level 5 x 8 min      Knee/Hip Exercises: Supine   Short Arc Quad Sets Strengthening;Right;2 sets;15 reps   Heel Slides AAROM;Right;15 reps  5"   Bridges 15 reps  SL with heels on peanut p-ball 5" hold    Straight Leg Raises Strengthening;Right;15 reps   Straight Leg Raises Limitations assist needed from therapist     Manual Therapy   Manual Therapy Passive ROM   Manual therapy comments passive knee flexion/extension stretch from therapist 5" x 10 reps each                   PT Short Term Goals - 01/26/16 1451      PT SHORT TERM GOAL #1   Title patient to be independent with HEP (03/16/16)   Status On-going     PT SHORT TERM GOAL #2   Title Patient to improve R knee PROM equal to that of L knee (03/16/16)   Status On-going           PT Long Term Goals - 01/26/16 1451      PT LONG TERM GOAL #1   Title patient to be independent with advacned HEP (03/16/16)   Status On-going     PT LONG TERM GOAL #2   Title Patient to improve R knee AROM equal to that of L knee (03/16/16)   Status On-going     PT LONG TERM GOAL #3   Title Patient to improve R LE strength to >/= 4/5 (03/16/16)   Status On-going     PT LONG TERM GOAL #4   Title Patient to demonstrate normal gait mechanics with good heel strike and toe off without use of AD (03/16/16)   Status On-going     PT LONG TERM GOAL #5   Title Patient to demonstrate SLR with no extensor lag (03/16/16)   Status On-going               Plan - 01/26/16 1521    Clinical Impression Statement Pt. requiring mod cues for proper sequencing with SPC today having to borrow cane from clinic due to, "leaving cane at home".  Pt. with tendency to lose proper SPC sequencing with conversation and would benefit from further skilled instruction with this.  Pt. with good tolerance for manual knee flexion/extension stretching today  and very compliance with therapist.  Pt. reporting she has been performing HEP at home however remains too weak to perform SLR without therapist assist.  Pt. will continues to benefit from further skilled  PT to maximize function and improve strength and ROM.   PT Treatment/Interventions ADLs/Self Care Home Management;Cryotherapy;Electrical Stimulation;Moist Heat;Neuromuscular re-education;Balance training;Therapeutic exercise;Therapeutic activities;Functional mobility training;Stair training;Gait training;Patient/family education;Manual techniques;Passive range of motion;Vasopneumatic Device;Taping   PT Next Visit Plan progress R knee strength/ROM      Patient will benefit from skilled therapeutic intervention in order to improve the following deficits and impairments:  Abnormal gait, Decreased activity tolerance, Decreased balance, Decreased range of motion, Decreased mobility, Decreased strength, Increased fascial restricitons, Pain, Difficulty walking  Visit Diagnosis: Acute pain of right knee  Stiffness of right knee, not elsewhere classified  Difficulty in walking, not elsewhere classified  Muscle weakness (generalized)  Other abnormalities of gait and mobility     Problem List Patient Active Problem List   Diagnosis Date Noted  . Primary osteoarthritis of right knee 12/31/2015  . Spinal stenosis of lumbar region 12/31/2015  . S/P TKR (total knee replacement), right 12/31/2015  . Diverticulitis of intestine without perforation or abscess without bleeding 09/18/2013  . Routine general medical examination at a health care facility 06/28/2013  . Overactive bladder 01/04/2013  . Onychomycosis 09/19/2012  . Right bundle branch block 03/07/2012  . Anemia 03/03/2011  . Rosacea 10/30/2010  . THYROID NODULE, RIGHT 09/10/2008  . THYROID STIMULATING HORMONE, ABNORMAL 09/08/2008  . Vitamin D deficiency 09/08/2008  . ALKALINE PHOSPHATASE, ELEVATED 09/08/2008  . OSTEOPOROSIS 08/21/2008   . BARIATRIC SURGERY STATUS 08/21/2008  . MORBID OBESITY 02/27/2007  . Anxiety state 02/27/2007  . Essential hypertension 02/27/2007  . HEMORRHOIDS 02/27/2007  . VENOUS INSUFFICIENCY 02/27/2007  . ALLERGIC RHINITIS 02/27/2007  . GERD 02/27/2007  . DEGENERATIVE JOINT DISEASE 02/27/2007    Bess Harvest, PTA 01/26/16 3:25 PM  [facility] [address]  [place], [place], [address] Phone: [phone]   Fax:  [phone]  Name: [name] [name] MRN: [id number] Date of Birth: 05-05-1955

## 2016-01-28 ENCOUNTER — Ambulatory Visit: Payer: BLUE CROSS/BLUE SHIELD

## 2016-01-28 DIAGNOSIS — M25561 Pain in right knee: Secondary | ICD-10-CM

## 2016-01-28 DIAGNOSIS — M6281 Muscle weakness (generalized): Secondary | ICD-10-CM

## 2016-01-28 DIAGNOSIS — M25661 Stiffness of right knee, not elsewhere classified: Secondary | ICD-10-CM

## 2016-01-28 DIAGNOSIS — R2689 Other abnormalities of gait and mobility: Secondary | ICD-10-CM

## 2016-01-28 DIAGNOSIS — R262 Difficulty in walking, not elsewhere classified: Secondary | ICD-10-CM

## 2016-01-28 NOTE — Therapy (Signed)
[facility] [address]  [place], [place], [address] Phone: [phone]   Fax:  [phone]  Physical Therapy Treatment  Patient Details  Name: [name] [name] MRN: [id number] Date of Birth: 22-Mar-1955 Referring Provider: Dr. [name]   Encounter Date: 01/28/2016      PT End of Session - 01/28/16 1409    Visit Number 4   Number of Visits 16   Date for PT Re-Evaluation 03/16/16   PT Start Time [id number]   PT Stop Time 1445   PT Time Calculation (min) 40 min   Activity Tolerance Patient tolerated treatment well   Behavior During Therapy [facility] for tasks assessed/performed      Past Medical History:  Diagnosis Date  . Allergy   . Anemia    takes iron supplement  . Anxiety   . Arthritis   . 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 Elevated alkaline phosphatase level    negative workup (presumed secondary to fatty liver)  . Frequency-urgency syndrome   . GERD (gastroesophageal reflux disease)   . Interstitial cystitis    Dr Vernie Shanks  . Neuromuscular disorder (HCC)    rt arm carpal tunnel syndrome  . Obesity    status [facility]  . Osteoporosis   . Thyroid nodule 7/10   `  . Thyroid nodule   . Varicose vein of leg     Past Surgical History:  Procedure Laterality Date  . ANTERIOR CERVICAL DECOMP/DISCECTOMY FUSION N/A 06/21/2012   Procedure: ANTERIOR CERVICAL DECOMPRESSION/DISCECTOMY FUSION C-4 - C5 (SPACER/DePUY CERVICAL PLATE ONLY) 1 LEVEL;  Surgeon: Melina Schools, MD;  Location: [place];  Service: Orthopedics;  Laterality: N/A;  . BLADDER SURGERY  02/21/12 and 02/28/12   Dr Diona Fanti  . COLPOSCOPY  1980  . CRYOTHERAPY  1980  . CYSTO WITH HYDRODISTENSION  12/12/2011   Procedure: CYSTOSCOPY/HYDRODISTENSION;  Surgeon: Franchot Gallo, MD;  Location: [facility];  Service: Urology;  Laterality: N/A;  30 MIN   . EYE SURGERY  1997   bilateral cataract extraction  .  FOOT SURGERY     bilateral bunionettes  . FOOT SURGERY Right 01/07/14   Pt states surgery was due to arthritis. "Has pins in place now"  . GASTRIC BYPASS  2005  . JOINT REPLACEMENT  2008   left total knee  . KNEE SURGERY  1997   left knee  . NASAL SINUS SURGERY  [id number]   Dr Erik Obey  . REFRACTIVE SURGERY  2006   lasik  . SHOULDER SURGERY  2 2012   RIGHT   . STOMACH SURGERY  2007   "tummy tuck"  . TOTAL KNEE ARTHROPLASTY Right 12/31/2015   Procedure: RIGHT TOTAL KNEE ARTHROPLASTY;  Surgeon: [name], MD;  Location: [facility];  Service: Orthopedics;  Laterality: Right;  Adductor Block  . VARICOSE VEIN SURGERY Right 02/2012   leg    There were no vitals filed for this visit.      Subjective Assessment - 01/28/16 1407    Subjective Pt. reporting R knee with, "pulling" sensation today.  Pt. reporting receiving two shots in R foot earlier today.     Patient Stated Goals reduce pain, improve walking   Currently in Pain? Yes   Pain Score 4    Pain Location Knee   Pain Orientation Right   Pain Descriptors / Indicators Aching   Pain Type Surgical pain   Pain Onset 1 to 4  weeks ago   Pain Frequency Constant   Multiple Pain Sites No              [facility] Adult PT Treatment/Exercise - 01/28/16 1424      Ambulation/Gait   Ambulation/Gait Yes   Ambulation/Gait Assistance 5: Supervision   Ambulation/Gait Assistance Details Pt. showing improve sequencing with SPC however SPC unable to be adjusted to correct height due to shortness of pt.; pt. still requiring frequent verbal cueing for proper sequencing and increased step length with conversational distractions   Ambulation Distance (Feet) 300 Feet   Assistive device Straight cane   Gait Pattern Step-through pattern;Decreased arm swing - right;Decreased stance time - right;Decreased step length - left;Decreased stride length;Decreased hip/knee flexion - right;Decreased weight shift to right;Right circumduction;Trendelenburg    Ambulation Surface Level;Indoor     Knee/Hip Exercises: Aerobic   Stationary Bike Recumbent bike: Half Revolutions x 6 min       Knee/Hip Exercises: Seated   Other Seated Knee/Hip Exercises Fitter leg press (1 blue/1 black band) x 15 reps; focusing on eccentric control      Knee/Hip Exercises: Supine   Short Arc Quad Sets Strengthening;Right;2 sets;15 reps   Heel Slides AAROM;Right;15 reps  with overpressure from therapist; 2nd set without strap 5" h   Bridges 15 reps  SL with heels on peanut p-ball    Straight Leg Raises Strengthening;Right;15 reps   Straight Leg Raises Limitations assist needed from therapist  1 finger assist (on concentric); no assist for eccentric                   PT Short Term Goals - 01/26/16 1451      PT SHORT TERM GOAL #1   Title patient to be independent with HEP (03/16/16)   Status On-going     PT SHORT TERM GOAL #2   Title Patient to improve R knee PROM equal to that of L knee (03/16/16)   Status On-going           PT Long Term Goals - 01/26/16 1451      PT LONG TERM GOAL #1   Title patient to be independent with advacned HEP (03/16/16)   Status On-going     PT LONG TERM GOAL #2   Title Patient to improve R knee AROM equal to that of L knee (03/16/16)   Status On-going     PT LONG TERM GOAL #3   Title Patient to improve R LE strength to >/= 4/5 (03/16/16)   Status On-going     PT LONG TERM GOAL #4   Title Patient to demonstrate normal gait mechanics with good heel strike and toe off without use of AD (03/16/16)   Status On-going     PT LONG TERM GOAL #5   Title Patient to demonstrate SLR with no extensor lag (03/16/16)   Status On-going               Plan - 01/28/16 1410    Clinical Impression Statement Pt. tolerated treatment well today with pain well managed.  Pt. showing slightly improved R SLR today requiring less assistance from therapist.  Pt. R quad extension activities still very weak today without added  resistance.  Pt. seen with SPC however SPC unable to be adjusted to correct height for pt.  Pt. showing improved sequencing with SPC however still requiring verbal cues to correct while carrying on conversation.  ROM not measured today.  Pt. admitting to skipping HEP last two  days.  Pt. instructed to consistently adhere to HEP to improve ROM.     PT Treatment/Interventions ADLs/Self Care Home Management;Cryotherapy;Electrical Stimulation;Moist Heat;Neuromuscular re-education;Balance training;Therapeutic exercise;Therapeutic activities;Functional mobility training;Stair training;Gait training;Patient/family education;Manual techniques;Passive range of motion;Vasopneumatic Device;Taping   PT Next Visit Plan progress R knee strength/ROM      Patient will benefit from skilled therapeutic intervention in order to improve the following deficits and impairments:  Abnormal gait, Decreased activity tolerance, Decreased balance, Decreased range of motion, Decreased mobility, Decreased strength, Increased fascial restricitons, Pain, Difficulty walking  Visit Diagnosis: Acute pain of right knee  Stiffness of right knee, not elsewhere classified  Difficulty in walking, not elsewhere classified  Muscle weakness (generalized)  Other abnormalities of gait and mobility     Problem List Patient Active Problem List   Diagnosis Date Noted  . Primary osteoarthritis of right knee 12/31/2015  . Spinal stenosis of lumbar region 12/31/2015  . S/P TKR (total knee replacement), right 12/31/2015  . Diverticulitis of intestine without perforation or abscess without bleeding 09/18/2013  . Routine general medical examination at a health care facility 06/28/2013  . Overactive bladder 01/04/2013  . Onychomycosis 09/19/2012  . Right bundle branch block 03/07/2012  . Anemia 03/03/2011  . Rosacea 10/30/2010  . THYROID NODULE, RIGHT 09/10/2008  . THYROID STIMULATING HORMONE, ABNORMAL 09/08/2008  . Vitamin D  deficiency 09/08/2008  . ALKALINE PHOSPHATASE, ELEVATED 09/08/2008  . OSTEOPOROSIS 08/21/2008  . BARIATRIC SURGERY STATUS 08/21/2008  . MORBID OBESITY 02/27/2007  . Anxiety state 02/27/2007  . Essential hypertension 02/27/2007  . HEMORRHOIDS 02/27/2007  . VENOUS INSUFFICIENCY 02/27/2007  . ALLERGIC RHINITIS 02/27/2007  . GERD 02/27/2007  . DEGENERATIVE JOINT DISEASE 02/27/2007    Bess Harvest, PTA 01/28/16 6:06 PM  [facility] [address]  [place], [place], [address] Phone: [phone]   Fax:  [phone]  Name: KIAUNA TULP MRN: [id number] Date of Birth: 16-May-1955

## 2016-02-02 ENCOUNTER — Other Ambulatory Visit: Payer: Self-pay | Admitting: Family

## 2016-02-02 NOTE — Telephone Encounter (Signed)
Spoke with pt. She states she had total right knee replacement 4 weeks ago and is still unable to drive. Advised her I will fax rx this month and we can have her pick up the next one. Rx faxed to pharmacy.

## 2016-02-02 NOTE — Telephone Encounter (Signed)
Last Alprazolam RF: 12/29/15, #[id number] Last OV:  12/16/15 Next OV:  Due 06/14/16 UDS: 08/11/15, due now.  Pt will need to pick up Rx and provide UDS at that time. Rx printed and forwarded to PCP for signature.

## 2016-02-03 ENCOUNTER — Ambulatory Visit: Payer: BLUE CROSS/BLUE SHIELD | Admitting: Physical Therapy

## 2016-02-03 DIAGNOSIS — M25661 Stiffness of right knee, not elsewhere classified: Secondary | ICD-10-CM

## 2016-02-03 DIAGNOSIS — R2689 Other abnormalities of gait and mobility: Secondary | ICD-10-CM

## 2016-02-03 DIAGNOSIS — M25561 Pain in right knee: Secondary | ICD-10-CM | POA: Diagnosis not present

## 2016-02-03 DIAGNOSIS — M6281 Muscle weakness (generalized): Secondary | ICD-10-CM

## 2016-02-03 DIAGNOSIS — R262 Difficulty in walking, not elsewhere classified: Secondary | ICD-10-CM

## 2016-02-03 NOTE — Therapy (Signed)
[facility] [address]  [place], [place], [address] Phone: [phone]   Fax:  [phone]  Physical Therapy Treatment  Patient Details  Name: [name] [name] MRN: [id number] Date of Birth: 06/05/1955 Referring Provider: Dr. [name]   Encounter Date: 02/03/2016      PT End of Session - 02/03/16 1452    Visit Number 5   Number of Visits 16   Date for PT Re-Evaluation 03/16/16   PT Start Time 1446   PT Stop Time 1527   PT Time Calculation (min) 41 min   Activity Tolerance Patient tolerated treatment well   Behavior During Therapy [facility] for tasks assessed/performed      Past Medical History:  Diagnosis Date  . Allergy   . Anemia    takes iron supplement  . Anxiety   . Arthritis   . 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 Elevated alkaline phosphatase level    negative workup (presumed secondary to fatty liver)  . Frequency-urgency syndrome   . GERD (gastroesophageal reflux disease)   . Interstitial cystitis    Dr Vernie Shanks  . Neuromuscular disorder (HCC)    rt arm carpal tunnel syndrome  . Obesity    status post bariatric procedure in [facility] 2005  . Osteoporosis   . Thyroid nodule 7/10   `  . Thyroid nodule   . Varicose vein of leg     Past Surgical History:  Procedure Laterality Date  . ANTERIOR CERVICAL DECOMP/DISCECTOMY FUSION N/A 06/21/2012   Procedure: ANTERIOR CERVICAL DECOMPRESSION/DISCECTOMY FUSION C-4 - C5 (SPACER/DePUY CERVICAL PLATE ONLY) 1 LEVEL;  Surgeon: Melina Schools, MD;  Location: [facility];  Service: Orthopedics;  Laterality: N/A;  . BLADDER SURGERY  02/21/12 and 02/28/12   Dr Diona Fanti  . COLPOSCOPY  1980  . CRYOTHERAPY  1980  . CYSTO WITH HYDRODISTENSION  12/12/2011   Procedure: CYSTOSCOPY/HYDRODISTENSION;  Surgeon: Franchot Gallo, MD;  Location: [facility];  Service: Urology;  Laterality: N/A;  30 MIN   . EYE SURGERY  1997   bilateral cataract extraction  .  FOOT SURGERY     bilateral bunionettes  . FOOT SURGERY Right 01/07/14   Pt states surgery was due to arthritis. "Has pins in place now"  . GASTRIC BYPASS  2005  . JOINT REPLACEMENT  2008   left total knee  . KNEE SURGERY  1997   left knee  . NASAL SINUS SURGERY  [id number]   Dr Erik Obey  . REFRACTIVE SURGERY  2006   lasik  . SHOULDER SURGERY  2 2012   RIGHT   . STOMACH SURGERY  2007   "tummy tuck"  . TOTAL KNEE ARTHROPLASTY Right 12/31/2015   Procedure: RIGHT TOTAL KNEE ARTHROPLASTY;  Surgeon: [name], MD;  Location: [facility];  Service: Orthopedics;  Laterality: Right;  Adductor Block  . VARICOSE VEIN SURGERY Right 02/2012   leg    There were no vitals filed for this visit.      Subjective Assessment - 02/03/16 1451    Subjective Patient doing well today - having some stiffness   Patient Stated Goals reduce pain, improve walking   Currently in Pain? Yes   Pain Score 1    Pain Location Knee   Pain Orientation Right   Pain Descriptors / Indicators Aching   Pain Type Surgical pain   Pain Onset 1 to 4 weeks ago   Pain Frequency Constant   Aggravating Factors  "using  knee"   Pain Relieving Factors pain meds, ice                         [facility] Adult PT Treatment/Exercise - 02/03/16 1452      Knee/Hip Exercises: Aerobic   Nustep level 5 x 8 minutes     Knee/Hip Exercises: Machines for Strengthening   Cybex Knee Extension 25# x 15 B LE - avoiding full knee extension   Cybex Knee Flexion 25# x 15 B LE      Knee/Hip Exercises: Standing   Step Down Limitations attempted - terminated due to inability to perform    Functional Squat 15 reps   Functional Squat Limitations B UE support - VC for form     Knee/Hip Exercises: Seated   Long Arc Quad Strengthening;Right;15 reps;Weights   Long Arc Quad Weight 2 lbs.   Other Seated Knee/Hip Exercises Fitter leg press (1 blue/1 black band) x 15 reps; focusing on eccentric control      Knee/Hip Exercises: Supine    Short Arc Quad Sets Strengthening;Right;2 sets;15 reps   Short Arc Quad Sets Limitations 2#   Bridges 15 reps   Straight Leg Raises Strengthening;Right;15 reps   Straight Leg Raises Limitations slight extensor lag                  PT Short Term Goals - 01/26/16 1451      PT SHORT TERM GOAL #1   Title patient to be independent with HEP (03/16/16)   Status On-going     PT SHORT TERM GOAL #2   Title Patient to improve R knee PROM equal to that of L knee (03/16/16)   Status On-going           PT Long Term Goals - 01/26/16 1451      PT LONG TERM GOAL #1   Title patient to be independent with advacned HEP (03/16/16)   Status On-going     PT LONG TERM GOAL #2   Title Patient to improve R knee AROM equal to that of L knee (03/16/16)   Status On-going     PT LONG TERM GOAL #3   Title Patient to improve R LE strength to >/= 4/5 (03/16/16)   Status On-going     PT LONG TERM GOAL #4   Title Patient to demonstrate normal gait mechanics with good heel strike and toe off without use of AD (03/16/16)   Status On-going     PT LONG TERM GOAL #5   Title Patient to demonstrate SLR with no extensor lag (03/16/16)   Status On-going               Plan - 02/03/16 1452    Clinical Impression Statement Patient doing well with PT - with improved strength with SLR and SAQ/LAQ with added resistance. Patient continuing to lack good eccentric quad control with step down activity - terminated due to inability to perfomr correctly. Patient reporting reduced reliance on SPC at this time. Patient to continue to benefit from PT to maximize function.    PT Treatment/Interventions ADLs/Self Care Home Management;Cryotherapy;Electrical Stimulation;Moist Heat;Neuromuscular re-education;Balance training;Therapeutic exercise;Therapeutic activities;Functional mobility training;Stair training;Gait training;Patient/family education;Manual techniques;Passive range of motion;Vasopneumatic Device;Taping    PT Next Visit Plan progress R knee strength/ROM   Consulted and Agree with Plan of Care Patient      Patient will benefit from skilled therapeutic intervention in order to improve the following deficits and impairments:  Abnormal  gait, Decreased activity tolerance, Decreased balance, Decreased range of motion, Decreased mobility, Decreased strength, Increased fascial restricitons, Pain, Difficulty walking  Visit Diagnosis: Acute pain of right knee  Stiffness of right knee, not elsewhere classified  Difficulty in walking, not elsewhere classified  Muscle weakness (generalized)  Other abnormalities of gait and mobility     Problem List Patient Active Problem List   Diagnosis Date Noted  . Primary osteoarthritis of right knee 12/31/2015  . Spinal stenosis of lumbar region 12/31/2015  . S/P TKR (total knee replacement), right 12/31/2015  . Diverticulitis of intestine without perforation or abscess without bleeding 09/18/2013  . Routine general medical examination at a health care facility 06/28/2013  . Overactive bladder 01/04/2013  . Onychomycosis 09/19/2012  . Right bundle branch block 03/07/2012  . Anemia 03/03/2011  . Rosacea 10/30/2010  . THYROID NODULE, RIGHT 09/10/2008  . THYROID STIMULATING HORMONE, ABNORMAL 09/08/2008  . Vitamin D deficiency 09/08/2008  . ALKALINE PHOSPHATASE, ELEVATED 09/08/2008  . OSTEOPOROSIS 08/21/2008  . BARIATRIC SURGERY STATUS 08/21/2008  . MORBID OBESITY 02/27/2007  . Anxiety state 02/27/2007  . Essential hypertension 02/27/2007  . HEMORRHOIDS 02/27/2007  . VENOUS INSUFFICIENCY 02/27/2007  . ALLERGIC RHINITIS 02/27/2007  . GERD 02/27/2007  . DEGENERATIVE JOINT DISEASE 02/27/2007      Lanney Gins, PT, DPT 02/03/16 4:46 PM    [facility] [address]  [place], [place], [address] Phone: [phone]   Fax:  [phone]  Name: [name] [name] MRN:  [id number] Date of Birth: September 05, 1955

## 2016-02-03 NOTE — Telephone Encounter (Signed)
Pt came in to the office stating the pharmacy never received below Rx.  Advised front office to have pt collect urine sample for UDS and gave printed Rx for pt.

## 2016-02-04 ENCOUNTER — Encounter: Payer: Self-pay | Admitting: Family

## 2016-02-05 ENCOUNTER — Ambulatory Visit: Payer: BLUE CROSS/BLUE SHIELD

## 2016-02-05 DIAGNOSIS — M25561 Pain in right knee: Secondary | ICD-10-CM | POA: Diagnosis not present

## 2016-02-05 DIAGNOSIS — M25661 Stiffness of right knee, not elsewhere classified: Secondary | ICD-10-CM

## 2016-02-05 DIAGNOSIS — M6281 Muscle weakness (generalized): Secondary | ICD-10-CM

## 2016-02-05 DIAGNOSIS — R262 Difficulty in walking, not elsewhere classified: Secondary | ICD-10-CM

## 2016-02-05 DIAGNOSIS — R2689 Other abnormalities of gait and mobility: Secondary | ICD-10-CM

## 2016-02-05 NOTE — Therapy (Signed)
[facility] [address]  [place], [place], [address] Phone: [phone]   Fax:  [phone]  Physical Therapy Treatment  Patient Details  Name: [name] [name] MRN: [id number] Date of Birth: 1955-05-27 Referring Provider: Dr. [name]   Encounter Date: 02/05/2016      PT End of Session - 02/05/16 1113    Visit Number 6   Number of Visits 16   Date for PT Re-Evaluation 03/16/16   PT Start Time 1104  pt. arrived late    PT Stop Time 1145   PT Time Calculation (min) 41 min   Activity Tolerance Patient tolerated treatment well   Behavior During Therapy [facility] for tasks assessed/performed      Past Medical History:  Diagnosis Date  . Allergy   . Anemia    takes iron supplement  . Anxiety   . Arthritis   . 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 Elevated alkaline phosphatase level    negative workup (presumed secondary to fatty liver)  . Frequency-urgency syndrome   . GERD (gastroesophageal reflux disease)   . Interstitial cystitis    Dr Vernie Shanks  . Neuromuscular disorder (HCC)    rt arm carpal tunnel syndrome  . Obesity    status post bariatric procedure in [facility] 2005  . Osteoporosis   . Thyroid nodule 7/10   `  . Thyroid nodule   . Varicose vein of leg     Past Surgical History:  Procedure Laterality Date  . ANTERIOR CERVICAL DECOMP/DISCECTOMY FUSION N/A 06/21/2012   Procedure: ANTERIOR CERVICAL DECOMPRESSION/DISCECTOMY FUSION C-4 - C5 (SPACER/DePUY CERVICAL PLATE ONLY) 1 LEVEL;  Surgeon: Melina Schools, MD;  Location: [facility];  Service: Orthopedics;  Laterality: N/A;  . BLADDER SURGERY  02/21/12 and 02/28/12   Dr Diona Fanti  . COLPOSCOPY  1980  . CRYOTHERAPY  1980  . CYSTO WITH HYDRODISTENSION  12/12/2011   Procedure: CYSTOSCOPY/HYDRODISTENSION;  Surgeon: Franchot Gallo, MD;  Location: [facility];  Service: Urology;  Laterality: N/A;  30 MIN   . EYE SURGERY  1997   bilateral  cataract extraction  . FOOT SURGERY     bilateral bunionettes  . FOOT SURGERY Right 01/07/14   Pt states surgery was due to arthritis. "Has pins in place now"  . GASTRIC BYPASS  2005  . JOINT REPLACEMENT  2008   left total knee  . KNEE SURGERY  1997   left knee  . NASAL SINUS SURGERY  [id number]   Dr Erik Obey  . REFRACTIVE SURGERY  2006   lasik  . SHOULDER SURGERY  2 2012   RIGHT   . STOMACH SURGERY  2007   "tummy tuck"  . TOTAL KNEE ARTHROPLASTY Right 12/31/2015   Procedure: RIGHT TOTAL KNEE ARTHROPLASTY;  Surgeon: [name], MD;  [facility];  Service: Orthopedics;  Laterality: Right;  Adductor Block  . VARICOSE VEIN SURGERY Right 02/2012   leg    There were no vitals filed for this visit.      Subjective Assessment - 02/05/16 1112    Subjective Pt. reporting she gets to the HEP activities, "most days".  Pt. reporting good swelling relief from shot to R foot over this week.     Patient Stated Goals reduce pain, improve walking   Currently in Pain? Yes   Pain Score 1    Pain Location Knee   Pain Orientation Right   Pain Descriptors / Indicators Aching;Throbbing   Pain  Type Surgical pain   Multiple Pain Sites No            [facility] PT Assessment - 02/05/16 1116      Assessment   Referring Provider Dr. [name]    Next MD Visit 02/12/16     AROM   AROM Assessment Site Knee   Right/Left Knee Right   Right Knee Extension 3  seated    Right Knee Flexion 92     PROM   PROM Assessment Site Knee   Right/Left Knee Right   Right Knee Extension 0   Right Knee Flexion 102              PT Education - 02/05/16 1148    Education provided Yes   Education Details TKE with black TB issued to pt., seated knee flexion stretch    Person(s) Educated Patient   Methods Explanation;Demonstration;Handout   Comprehension Verbalized understanding;Returned demonstration;Need further instruction          PT Short Term Goals - 01/26/16 1451      PT SHORT TERM  GOAL #1   Title patient to be independent with HEP (03/16/16)   Status On-going     PT SHORT TERM GOAL #2   Title Patient to improve R knee PROM equal to that of L knee (03/16/16)   Status On-going           PT Long Term Goals - 01/26/16 1451      PT LONG TERM GOAL #1   Title patient to be independent with advacned HEP (03/16/16)   Status On-going     PT LONG TERM GOAL #2   Title Patient to improve R knee AROM equal to that of L knee (03/16/16)   Status On-going     PT LONG TERM GOAL #3   Title Patient to improve R LE strength to >/= 4/5 (03/16/16)   Status On-going     PT LONG TERM GOAL #4   Title Patient to demonstrate normal gait mechanics with good heel strike and toe off without use of AD (03/16/16)   Status On-going     PT LONG TERM GOAL #5   Title Patient to demonstrate SLR with no extensor lag (03/16/16)   Status On-going               Plan - 02/05/16 1156    Clinical Impression Statement Pt. R knee ROM improved today to AROM 3-92 dg, PROM 0-102 dg.  Pt. progressing well however still very weak with R knee TKE activities.  Closed chain TKE with black TB closed in door added to HEP today with additional seated knee flexion stretches issued via handout.  Pt. admitting to poor HEP adherence today.  Significant time taken today instructing pt. in consequences of poor HEP adherence and slow knee flexion progress with rationale provided.  Pt. reporting she will adhere to HEP better in future and focus efforts on knee flexion stretching and quad strengthening activities.  Will closely monitor pt. adherence to HEP next treatment and continue focus on ROM and TKE strengthening activities.  Pt. with MD f/u on 12.29.17.    PT Treatment/Interventions ADLs/Self Care Home Management;Cryotherapy;Electrical Stimulation;Moist Heat;Neuromuscular re-education;Balance training;Therapeutic exercise;Therapeutic activities;Functional mobility training;Stair training;Gait  training;Patient/family education;Manual techniques;Passive range of motion;Vasopneumatic Device;Taping   PT Next Visit Plan BEGIN CHECKING GOALS FOR MD f/u on 12.29.17; progress R knee strength/ROM      Patient will benefit from skilled therapeutic intervention in order to improve the following  deficits and impairments:  Abnormal gait, Decreased activity tolerance, Decreased balance, Decreased range of motion, Decreased mobility, Decreased strength, Increased fascial restricitons, Pain, Difficulty walking  Visit Diagnosis: Acute pain of right knee  Stiffness of right knee, not elsewhere classified  Difficulty in walking, not elsewhere classified  Muscle weakness (generalized)  Other abnormalities of gait and mobility     Problem List Patient Active Problem List   Diagnosis Date Noted  . Primary osteoarthritis of right knee 12/31/2015  . Spinal stenosis of lumbar region 12/31/2015  . S/P TKR (total knee replacement), right 12/31/2015  . Diverticulitis of intestine without perforation or abscess without bleeding 09/18/2013  . Routine general medical examination at a health care facility 06/28/2013  . Overactive bladder 01/04/2013  . Onychomycosis 09/19/2012  . Right bundle branch block 03/07/2012  . Anemia 03/03/2011  . Rosacea 10/30/2010  . THYROID NODULE, RIGHT 09/10/2008  . THYROID STIMULATING HORMONE, ABNORMAL 09/08/2008  . Vitamin D deficiency 09/08/2008  . ALKALINE PHOSPHATASE, ELEVATED 09/08/2008  . OSTEOPOROSIS 08/21/2008  . BARIATRIC SURGERY STATUS 08/21/2008  . MORBID OBESITY 02/27/2007  . Anxiety state 02/27/2007  . Essential hypertension 02/27/2007  . HEMORRHOIDS 02/27/2007  . VENOUS INSUFFICIENCY 02/27/2007  . ALLERGIC RHINITIS 02/27/2007  . GERD 02/27/2007  . DEGENERATIVE JOINT DISEASE 02/27/2007    Bess Harvest, PTA 02/05/16 12:05 PM  [facility] [address]  [place], [place],  [address] Phone: [phone]   Fax:  [phone]  Name: [name] [name] MRN: [id number] Date of Birth: Feb 11, 1956

## 2016-02-10 ENCOUNTER — Ambulatory Visit: Payer: BLUE CROSS/BLUE SHIELD | Admitting: Physical Therapy

## 2016-02-10 DIAGNOSIS — M25561 Pain in right knee: Secondary | ICD-10-CM | POA: Diagnosis not present

## 2016-02-10 DIAGNOSIS — M6281 Muscle weakness (generalized): Secondary | ICD-10-CM

## 2016-02-10 DIAGNOSIS — M25661 Stiffness of right knee, not elsewhere classified: Secondary | ICD-10-CM

## 2016-02-10 DIAGNOSIS — R262 Difficulty in walking, not elsewhere classified: Secondary | ICD-10-CM

## 2016-02-10 DIAGNOSIS — R2689 Other abnormalities of gait and mobility: Secondary | ICD-10-CM

## 2016-02-10 NOTE — Therapy (Signed)
[facility] [address]  [place], [place], [address] Phone: [phone]   Fax:  [phone]  Physical Therapy Treatment  Patient Details  Name: [name] [name] MRN: [id number] Date of Birth: 03-02-1955 Referring Provider: Dr. [name]   Encounter Date: 02/10/2016      PT End of Session - 02/10/16 1405    Visit Number 7   Number of Visits 16   Date for PT Re-Evaluation 03/16/16   PT Start Time [id number]   PT Stop Time 1446   PT Time Calculation (min) 44 min   Activity Tolerance Patient tolerated treatment well   Behavior During Therapy [facility] for tasks assessed/performed      Past Medical History:  Diagnosis Date  . Allergy   . Anemia    takes iron supplement  . Anxiety   . Arthritis   . 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 Elevated alkaline phosphatase level    negative workup (presumed secondary to fatty liver)  . Frequency-urgency syndrome   . GERD (gastroesophageal reflux disease)   . Interstitial cystitis    Dr Vernie Shanks  . Neuromuscular disorder (HCC)    rt arm carpal tunnel syndrome  . Obesity    status post bariatric procedure in [facility] 2005  . Osteoporosis   . Thyroid nodule 7/10   `  . Thyroid nodule   . Varicose vein of leg     Past Surgical History:  Procedure Laterality Date  . ANTERIOR CERVICAL DECOMP/DISCECTOMY FUSION N/A 06/21/2012   Procedure: ANTERIOR CERVICAL DECOMPRESSION/DISCECTOMY FUSION C-4 - C5 (SPACER/DePUY CERVICAL PLATE ONLY) 1 LEVEL;  Surgeon: Melina Schools, MD;  Location: [facility];  Service: Orthopedics;  Laterality: N/A;  . BLADDER SURGERY  02/21/12 and 02/28/12   Dr Diona Fanti  . COLPOSCOPY  1980  . CRYOTHERAPY  1980  . CYSTO WITH HYDRODISTENSION  12/12/2011   Procedure: CYSTOSCOPY/HYDRODISTENSION;  Surgeon: Franchot Gallo, MD;  Location: [facility];  Service: Urology;  Laterality: N/A;  30 MIN   . EYE SURGERY  1997   bilateral cataract extraction  .  FOOT SURGERY     bilateral bunionettes  . FOOT SURGERY Right 01/07/14   Pt states surgery was due to arthritis. "Has pins in place now"  . GASTRIC BYPASS  2005  . JOINT REPLACEMENT  2008   left total knee  . KNEE SURGERY  1997   left knee  . NASAL SINUS SURGERY  [id number]   Dr Erik Obey  . REFRACTIVE SURGERY  2006   lasik  . SHOULDER SURGERY  2 2012   RIGHT   . STOMACH SURGERY  2007   "tummy tuck"  . TOTAL KNEE ARTHROPLASTY Right 12/31/2015   Procedure: RIGHT TOTAL KNEE ARTHROPLASTY;  Surgeon: [name], MD;  Location[facility];  Service: Orthopedics;  Laterality: Right;  Adductor Block  . VARICOSE VEIN SURGERY Right 02/2012   leg    There were no vitals filed for this visit.      Subjective Assessment - 02/10/16 1404    Subjective Patient doing well - did not do HEP much due to standing up for prolonged periods.    Patient Stated Goals reduce pain, improve walking   Currently in Pain? No/denies   Pain Score 0-No pain            [facility] PT Assessment - 02/10/16 0001      AROM   Right Knee Extension 0   Right Knee Flexion  94                     [facility] Adult PT Treatment/Exercise - 02/10/16 1407      Knee/Hip Exercises: Aerobic   Nustep level 5 x 8 minutes      Knee/Hip Exercises: Standing   Terminal Knee Extension Limitations R LE - black band in door 2 x 15   SLS SLS on blue foam - 1 UE support - 6 x 30 seconds - difficult     Knee/Hip Exercises: Seated   Long Arc Quad Strengthening;Right;15 reps;Weights   Long Arc Quad Weight 3 lbs.   Other Seated Knee/Hip Exercises Fitter - 1blue/1black x 15 reps      Knee/Hip Exercises: Supine   Bridges Strengthening;Both;15 reps   Bridges Limitations blue tband at knees   Straight Leg Raises Strengthening;Right;15 reps   Straight Leg Raises Limitations 2# - fatigues quickly                  PT Short Term Goals - 02/10/16 1539      PT SHORT TERM GOAL #1   Title patient to be independent with HEP  (03/16/16)   Status On-going     PT SHORT TERM GOAL #2   Title Patient to improve R knee PROM equal to that of L knee (03/16/16)   Status On-going  knee ext: 0; knee flexion: 94           PT Long Term Goals - 02/10/16 1540      PT LONG TERM GOAL #1   Title patient to be independent with advacned HEP (03/16/16)   Status On-going     PT LONG TERM GOAL #2   Title Patient to improve R knee AROM equal to that of L knee (03/16/16)   Status On-going     PT LONG TERM GOAL #3   Title Patient to improve R LE strength to >/= 4/5 (03/16/16)   Status On-going     PT LONG TERM GOAL #4   Title Patient to demonstrate normal gait mechanics with good heel strike and toe off without use of AD (03/16/16)   Status On-going     PT LONG TERM GOAL #5   Title Patient to demonstrate SLR with no extensor lag (03/16/16)   Status On-going               Plan - 02/10/16 1405    Clinical Impression Statement Patient doing well today - much improved gait pattern without use of AD. Slight limp likely due to reduced end range quad strength. Patient able to progress all activities well with addition of SL stance on foam for overall improvements in R LE strength and balance. Patient to continue to benefit form PT to maximize function.    PT Treatment/Interventions ADLs/Self Care Home Management;Cryotherapy;Electrical Stimulation;Moist Heat;Neuromuscular re-education;Balance training;Therapeutic exercise;Therapeutic activities;Functional mobility training;Stair training;Gait training;Patient/family education;Manual techniques;Passive range of motion;Vasopneumatic Device;Taping   PT Next Visit Plan  progress R knee strength/ROM; MD appt 12/29   Consulted and Agree with Plan of Care Patient      Patient will benefit from skilled therapeutic intervention in order to improve the following deficits and impairments:  Abnormal gait, Decreased activity tolerance, Decreased balance, Decreased range of motion,  Decreased mobility, Decreased strength, Increased fascial restricitons, Pain, Difficulty walking  Visit Diagnosis: Acute pain of right knee  Stiffness of right knee, not elsewhere classified  Difficulty in walking, not elsewhere classified  Muscle weakness (generalized)  Other abnormalities of gait and mobility     Problem List Patient Active Problem List   Diagnosis Date Noted  . Primary osteoarthritis of right knee 12/31/2015  . Spinal stenosis of lumbar region 12/31/2015  . S/P TKR (total knee replacement), right 12/31/2015  . Diverticulitis of intestine without perforation or abscess without bleeding 09/18/2013  . Routine general medical examination at a health care facility 06/28/2013  . Overactive bladder 01/04/2013  . Onychomycosis 09/19/2012  . Right bundle branch block 03/07/2012  . Anemia 03/03/2011  . Rosacea 10/30/2010  . THYROID NODULE, RIGHT 09/10/2008  . THYROID STIMULATING HORMONE, ABNORMAL 09/08/2008  . Vitamin D deficiency 09/08/2008  . ALKALINE PHOSPHATASE, ELEVATED 09/08/2008  . OSTEOPOROSIS 08/21/2008  . BARIATRIC SURGERY STATUS 08/21/2008  . MORBID OBESITY 02/27/2007  . Anxiety state 02/27/2007  . Essential hypertension 02/27/2007  . HEMORRHOIDS 02/27/2007  . VENOUS INSUFFICIENCY 02/27/2007  . ALLERGIC RHINITIS 02/27/2007  . GERD 02/27/2007  . DEGENERATIVE JOINT DISEASE 02/27/2007     Lanney Gins, PT, DPT 02/10/16 3:41 PM   [facility] [address]  [place], [place], [address] Phone: [phone]   Fax:  [phone]  Name: VERLEE FAVARA MRN: [id number] Date of Birth: 13-Mar-1955

## 2016-02-11 ENCOUNTER — Ambulatory Visit: Payer: BLUE CROSS/BLUE SHIELD

## 2016-02-11 DIAGNOSIS — M25661 Stiffness of right knee, not elsewhere classified: Secondary | ICD-10-CM

## 2016-02-11 DIAGNOSIS — M25561 Pain in right knee: Secondary | ICD-10-CM | POA: Diagnosis not present

## 2016-02-11 DIAGNOSIS — M6281 Muscle weakness (generalized): Secondary | ICD-10-CM

## 2016-02-11 DIAGNOSIS — R262 Difficulty in walking, not elsewhere classified: Secondary | ICD-10-CM

## 2016-02-11 DIAGNOSIS — R2689 Other abnormalities of gait and mobility: Secondary | ICD-10-CM

## 2016-02-11 NOTE — Therapy (Signed)
[facility] [address]  [place], [place], [address] Phone: [phone]   Fax:  [phone]  Physical Therapy Treatment  Patient Details  Name: [name] [name] MRN: [id number] Date of Birth: 1955/12/29 Referring Provider: Dr. [name]   Encounter Date: 02/11/2016      PT End of Session - 02/11/16 1419    Visit Number 8   Number of Visits 16   Date for PT Re-Evaluation 03/16/16   PT Start Time 1400   PT Stop Time 1445   PT Time Calculation (min) 45 min   Activity Tolerance Patient tolerated treatment well   Behavior During Therapy [facility] for tasks assessed/performed      Past Medical History:  Diagnosis Date  . Allergy   . Anemia    takes iron supplement  . Anxiety   . Arthritis   . 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 Elevated alkaline phosphatase level    negative workup (presumed secondary to fatty liver)  . Frequency-urgency syndrome   . GERD (gastroesophageal reflux disease)   . Interstitial cystitis    Dr Vernie Shanks  . Neuromuscular disorder (HCC)    rt arm carpal tunnel syndrome  . Obesity    status post bariatric procedure in [facility] 2005  . Osteoporosis   . Thyroid nodule 7/10   `  . Thyroid nodule   . Varicose vein of leg     Past Surgical History:  Procedure Laterality Date  . ANTERIOR CERVICAL DECOMP/DISCECTOMY FUSION N/A 06/21/2012   Procedure: ANTERIOR CERVICAL DECOMPRESSION/DISCECTOMY FUSION C-4 - C5 (SPACER/DePUY CERVICAL PLATE ONLY) 1 LEVEL;  Surgeon: Melina Schools, MD;  Location: [facility];  Service: Orthopedics;  Laterality: N/A;  . BLADDER SURGERY  02/21/12 and 02/28/12   Dr Diona Fanti  . COLPOSCOPY  1980  . CRYOTHERAPY  1980  . CYSTO WITH HYDRODISTENSION  12/12/2011   Procedure: CYSTOSCOPY/HYDRODISTENSION;  Surgeon: Franchot Gallo, MD;  Location: [facility];  Service: Urology;  Laterality: N/A;  30 MIN   . EYE SURGERY  1997   bilateral cataract extraction  .  FOOT SURGERY     bilateral bunionettes  . FOOT SURGERY Right 01/07/14   Pt states surgery was due to arthritis. "Has pins in place now"  . GASTRIC BYPASS  2005  . JOINT REPLACEMENT  2008   left total knee  . KNEE SURGERY  1997   left knee  . NASAL SINUS SURGERY  4765   Dr Erik Obey  . REFRACTIVE SURGERY  2006   lasik  . SHOULDER SURGERY  2 2012   RIGHT   . STOMACH SURGERY  2007   "tummy tuck"  . TOTAL KNEE ARTHROPLASTY Right 12/31/2015   Procedure: RIGHT TOTAL KNEE ARTHROPLASTY;  Surgeon: [name], MD;  Location: [facility];  Service: Orthopedics;  Laterality: Right;  Adductor Block  . VARICOSE VEIN SURGERY Right 02/2012   leg    There were no vitals filed for this visit.      Subjective Assessment - 02/11/16 1418    Subjective Pt. reporting she feels as though she has, "taken a good turn".     Patient Stated Goals reduce pain, improve walking   Currently in Pain? No/denies   Pain Score 0-No pain   Multiple Pain Sites No            [facility] PT Assessment - 02/11/16 1423      AROM   AROM Assessment Site Knee  Right/Left Knee Right   Right Knee Extension 2   Right Knee Flexion 95     PROM   PROM Assessment Site Knee   Right/Left Knee Right   Right Knee Extension 0   Right Knee Flexion 103     Strength   Strength Assessment Site Hip;Knee   Right/Left Hip Right;Left   Right Hip Flexion 4-/5   Right Hip Extension 4-/5   Right/Left Knee Right;Left   Right Knee Flexion 4+/5   Right Knee Extension 4/5           [facility] Adult PT Treatment/Exercise - 02/11/16 1440      Knee/Hip Exercises: Stretches   Passive Hamstring Stretch 2 reps;30 seconds   Passive Hamstring Stretch Limitations with therapist    Gastroc Stretch 2 reps;30 seconds   Gastroc Stretch Limitations with therapist    Other Knee/Hip Stretches Seated knee flexion stretch 5" x 15 reps     Knee/Hip Exercises: Aerobic   Nustep level 5 x 6 minutes      Knee/Hip Exercises: Standing   Functional  Squat 15 reps  mirror training for even wt. shift    Functional Squat Limitations TRX   cues for even wt. shift      Knee/Hip Exercises: Seated   Other Seated Knee/Hip Exercises Fitter - 1blue/1black x 15 reps      Knee/Hip Exercises: Supine   Other Supine Knee/Hip Exercises Flexion stretch with heels on peanut p-ball 5" x 15 reps    Other Supine Knee/Hip Exercises bridge with straight legs heels on peanut p-ball 5" x 15 reps     Manual Therapy   Manual Therapy Passive ROM   Manual therapy comments passive knee flexion/extension stretch from therapist 5" x 15 reps each                   PT Short Term Goals - 02/11/16 1426      PT SHORT TERM GOAL #1   Title patient to be independent with HEP (03/16/16)   Status Partially Met  12.28.17: met for current HEP      PT SHORT TERM GOAL #2   Title Patient to improve R knee PROM equal to that of L knee (03/16/16)   Status Partially Met  12.28.17: R knee PROM 0-103 dg           PT Long Term Goals - 02/11/16 1446      PT LONG TERM GOAL #1   Title patient to be independent with advacned HEP (03/16/16)   Status On-going     PT LONG TERM GOAL #2   Title Patient to improve R knee AROM equal to that of L knee (03/16/16)   Status Partially Met  12.28.17: R knee AROM 2-95 dg, L knee ROM -1-100 dg (measured on eval.)     PT LONG TERM GOAL #3   Title Patient to improve R LE strength to >/= 4/5 (03/16/16)   Status Partially Met  12.28.17: met for knee strength not met for hip flexion, extension still 4-/5     PT LONG TERM GOAL #4   Title Patient to demonstrate normal gait mechanics with good heel strike and toe off without use of AD (03/16/16)   Status Partially Met  12.28.17: Pt. only with slight limb on R with good heel strike, now pain free, no AD     PT LONG TERM GOAL #5   Title Patient to demonstrate SLR with no extensor lag (03/16/16)   Status  On-going  12.28.17: still with 2 dg extensor lag               Plan  - 02/11/16 1438    Clinical Impression Statement Pt. reporting initially pain free and feels she has, "taken a good turn".  Pt. able to demo improved R knee ROM today; 2-95 dg AROM and 0-103 dg PROM.  Pt. gait without AD normalizing with good heel strike and only slight antalgic gait on R.  Pt. only demonstrating 2dg extensor lag with SLR today and R LE strength goal met with exception of R hip extension, flexion still 4-/5.  Pt. instructed to increase frequency of R knee flexion HEP stretches in coming weeks to improve ROM.  Pt. B knee ROM nearly symmetrical now L knee AROM -1-100 dg and R knee AROM 2-95.  Pt. progressing well and seeing MD for f/u tomorrow.    PT Treatment/Interventions ADLs/Self Care Home Management;Cryotherapy;Electrical Stimulation;Moist Heat;Neuromuscular re-education;Balance training;Therapeutic exercise;Therapeutic activities;Functional mobility training;Stair training;Gait training;Patient/family education;Manual techniques;Passive range of motion;Vasopneumatic Device;Taping   PT Next Visit Plan Progress R knee strength/ROM      Patient will benefit from skilled therapeutic intervention in order to improve the following deficits and impairments:  Abnormal gait, Decreased activity tolerance, Decreased balance, Decreased range of motion, Decreased mobility, Decreased strength, Increased fascial restricitons, Pain, Difficulty walking  Visit Diagnosis: Acute pain of right knee  Stiffness of right knee, not elsewhere classified  Difficulty in walking, not elsewhere classified  Muscle weakness (generalized)  Other abnormalities of gait and mobility     Problem List Patient Active Problem List   Diagnosis Date Noted  . Primary osteoarthritis of right knee 12/31/2015  . Spinal stenosis of lumbar region 12/31/2015  . S/P TKR (total knee replacement), right 12/31/2015  . Diverticulitis of intestine without perforation or abscess without bleeding 09/18/2013  . Routine  general medical examination at a health care facility 06/28/2013  . Overactive bladder 01/04/2013  . Onychomycosis 09/19/2012  . Right bundle branch block 03/07/2012  . Anemia 03/03/2011  . Rosacea 10/30/2010  . THYROID NODULE, RIGHT 09/10/2008  . THYROID STIMULATING HORMONE, ABNORMAL 09/08/2008  . Vitamin D deficiency 09/08/2008  . ALKALINE PHOSPHATASE, ELEVATED 09/08/2008  . OSTEOPOROSIS 08/21/2008  . BARIATRIC SURGERY STATUS 08/21/2008  . MORBID OBESITY 02/27/2007  . Anxiety state 02/27/2007  . Essential hypertension 02/27/2007  . HEMORRHOIDS 02/27/2007  . VENOUS INSUFFICIENCY 02/27/2007  . ALLERGIC RHINITIS 02/27/2007  . GERD 02/27/2007  . DEGENERATIVE JOINT DISEASE 02/27/2007    Bess Harvest, PTA 02/11/16 3:20 PM  [facility] [address]  [place], [place], [address] Phone: [phone]   Fax:  [phone]  Name: HILDRED PHARO MRN: [id number] Date of Birth: 1955/05/28

## 2016-02-16 ENCOUNTER — Ambulatory Visit (INDEPENDENT_AMBULATORY_CARE_PROVIDER_SITE_OTHER): Payer: BLUE CROSS/BLUE SHIELD | Admitting: Family

## 2016-02-16 ENCOUNTER — Encounter: Payer: Self-pay | Admitting: Family

## 2016-02-16 ENCOUNTER — Ambulatory Visit: Payer: BLUE CROSS/BLUE SHIELD | Attending: Specialist

## 2016-02-16 VITALS — BP 128/90 | HR 105 | Temp 98.3°F | Resp 16 | Ht 65.0 in | Wt 230.8 lb

## 2016-02-16 DIAGNOSIS — R2689 Other abnormalities of gait and mobility: Secondary | ICD-10-CM | POA: Diagnosis present

## 2016-02-16 DIAGNOSIS — Z9109 Other allergy status, other than to drugs and biological substances: Secondary | ICD-10-CM

## 2016-02-16 DIAGNOSIS — M25661 Stiffness of right knee, not elsewhere classified: Secondary | ICD-10-CM

## 2016-02-16 DIAGNOSIS — M6281 Muscle weakness (generalized): Secondary | ICD-10-CM | POA: Insufficient documentation

## 2016-02-16 DIAGNOSIS — M25561 Pain in right knee: Secondary | ICD-10-CM | POA: Diagnosis present

## 2016-02-16 DIAGNOSIS — R262 Difficulty in walking, not elsewhere classified: Secondary | ICD-10-CM

## 2016-02-16 MED ORDER — PREDNISONE 10 MG PO TABS: ORAL_TABLET | ORAL | 0 refills | Status: DC

## 2016-02-16 NOTE — Patient Instructions (Signed)
Begin nasonex, begin prednisone.  Call if new/worsening symptoms or if symptoms are not improved in 3 days.

## 2016-02-16 NOTE — Progress Notes (Signed)
Subjective:    Patient ID: [name] [name], female    DOB: Jun 30, 1955, 61 y.o.   MRN: [id number]  HPI  [name] is a 61 yr old female who presents today with chief complaint of cough.  Reports that her symptoms began on Friday after taking down the christmas tree.  Notes nasal discharge (green/blood) tinged.  Reports that she developed hoarseness about 2 hours after exposure to the dust.  Reports previous hx of similar allergic reactions in the past which she has seen her allergist for and have "only responded to prednisone."     Review of Systems See HPI  Past Medical History:  Diagnosis Date  . Allergy   . Anemia    takes iron supplement  . Anxiety   . Arthritis   . 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 Elevated alkaline phosphatase level    negative workup (presumed secondary to fatty liver)  . Frequency-urgency syndrome   . GERD (gastroesophageal reflux disease)   . Interstitial cystitis    Dr Vernie Shanks  . Neuromuscular disorder (HCC)    rt arm carpal tunnel syndrome  . Obesity    status post bariatric procedure in [facility] 2005  . Osteoporosis   . Thyroid nodule 7/10   `  . Thyroid nodule   . Varicose vein of leg      Social History   Social History  . Marital status: Divorced    Spouse name: N/A  . Number of children: 1  . Years of education: N/A   Occupational History  . [employer]   Social History Main Topics  . Smoking status: Never Smoker  . Smokeless tobacco: Never Used  . Alcohol use Yes     Comment: RARE  . Drug use: No  . Sexual activity: Yes   Other Topics Concern  . Not on file   Social History Narrative  . No narrative on file    Past Surgical History:  Procedure Laterality Date  . ANTERIOR CERVICAL DECOMP/DISCECTOMY FUSION N/A 06/21/2012   Procedure: ANTERIOR CERVICAL DECOMPRESSION/DISCECTOMY FUSION C-4 - C5 (SPACER/DePUY CERVICAL PLATE ONLY) 1 LEVEL;  Surgeon: Melina Schools, MD;  Location: [facility];  Service:  Orthopedics;  Laterality: N/A;  . BLADDER SURGERY  02/21/12 and 02/28/12   Dr Diona Fanti  . COLPOSCOPY  1980  . CRYOTHERAPY  1980  . CYSTO WITH HYDRODISTENSION  12/12/2011   Procedure: CYSTOSCOPY/HYDRODISTENSION;  Surgeon: Franchot Gallo, MD;  Location: [facility];  Service: Urology;  Laterality: N/A;  30 MIN   . EYE SURGERY  1997   bilateral cataract extraction  . FOOT SURGERY     bilateral bunionettes  . FOOT SURGERY Right 01/07/14   Pt states surgery was due to arthritis. "Has pins in place now"  . GASTRIC BYPASS  2005  . JOINT REPLACEMENT  2008   left total knee  . KNEE SURGERY  1997   left knee  . NASAL SINUS SURGERY  [id number]   Dr Erik Obey  . REFRACTIVE SURGERY  2006   lasik  . SHOULDER SURGERY  2 2012   RIGHT   . STOMACH SURGERY  2007   "tummy tuck"  . TOTAL KNEE ARTHROPLASTY Right 12/31/2015   Procedure: RIGHT TOTAL KNEE ARTHROPLASTY;  Surgeon: [name], MD;  Location: [facility];  Service: Orthopedics;  Laterality: Right;  Adductor Block  . VARICOSE VEIN SURGERY Right 02/2012   leg    Family History  Problem Relation Age of  Onset  . Diabetes Paternal Grandfather   . Stroke Paternal Grandfather   . Hyperlipidemia Mother   . Hypertension Mother   . Hypertension Father   . Diabetes Father   . Prostate cancer Father   . Dementia Father   . Heart disease Maternal Grandmother   . Hypertension Maternal Grandmother   . Heart disease Maternal Grandfather   . Hypertension Maternal Grandfather   . Stroke Maternal Grandfather     Allergies  Allergen Reactions  . 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  No allergy.  . Nitrofurantoin     REACTION: fever  . Prednisone     REACTION: palpitations.  06/1715  Pt states she can now tolerate prednisone.    Current Outpatient Prescriptions on File Prior to Visit  Medication Sig Dispense Refill  . acetaminophen (TYLENOL) 650 MG CR tablet Take 2 tablets (1,300 mg total) by  mouth every 8 (eight) hours as needed for pain. Take for mild pain. Do not combine with Percocet. Do not exceed daily recommended limit of Tylenol.    . ALPRAZolam (XANAX) 0.5 MG tablet TAKE 1 TABLET BY MOUTH TWICE A DAY 60 tablet 0  . amitriptyline (ELAVIL) 50 MG tablet TAKE 1 TABLET BY MOUTH EVERY EVENING  3  . azelastine (ASTELIN) 0.1 % nasal spray Place 1 spray into both nostrils daily. Use in each nostril as directed    . Calcium Carbonate Antacid (TUMS ULTRA 1000 PO) Take 1,000 mg by mouth 2 (two) times daily.    . cycloSPORINE (RESTASIS) 0.05 % ophthalmic emulsion Place 1 drop into both eyes 2 (two) times daily.    Marland Kitchen docusate sodium (COLACE) 100 MG capsule Take 1 capsule (100 mg total) by mouth 2 (two) times daily as needed for mild constipation. 30 capsule 1  . ferrous sulfate 325 (65 FE) MG tablet Take 1 tablet (325 mg total) by mouth 2 (two) times daily. (Patient taking differently: Take 325 mg by mouth 2 (two) times daily. Has stopped prior to procedure) 30 tablet 5  . fexofenadine (ALLEGRA) 180 MG tablet Take 180 mg by mouth every evening.     . methocarbamol (ROBAXIN) 500 MG tablet Take 1 tablet (500 mg total) by mouth every 6 (six) hours as needed for muscle spasms. 40 tablet 1  . MYRBETRIQ 50 MG TB24 tablet Take 50 mg by mouth every evening.   2  . omeprazole (PRILOSEC) 40 MG capsule TAKE 1 CAPSULE (40 MG TOTAL) BY MOUTH DAILY. (Patient taking differently: Take 40 mg by mouth every evening. ) 90 capsule 1  . OVER THE COUNTER MEDICATION Place 1 drop into both eyes 2 (two) times daily. Allergy eye drops    . oxyCODONE-acetaminophen (PERCOCET) 10-325 MG tablet Take 1 tablet by mouth every 4 (four) hours as needed for pain (severe). 40 tablet 0  . PARoxetine (PAXIL) 30 MG tablet TAKE 1 TABLET BY MOUTH EVERY MORNING (Patient taking differently: TAKE 1 TABLET BY MOUTH EVERY EVENING) 90 tablet 0  . phenazopyridine (PYRIDIUM) 200 MG tablet Take 200 mg by mouth every 6 (six) hours as needed for  pain.   11  . polyethylene glycol (MIRALAX / GLYCOLAX) packet Take 17 g by mouth daily. 14 each 0  . valACYclovir (VALTREX) 500 MG tablet TAKE 1 TABLET BY MOUTH EVERY DAY. NEEDS APPT 30 tablet 0  . Vitamin D, Ergocalciferol, (DRISDOL) 50000 units CAPS capsule TAKE 1 CAPSULE (50,000 UNITS TOTAL) BY MOUTH 2 (TWO) TIMES  A WEEK. 24 capsule 0   No current facility-administered medications on file prior to visit.     BP 128/90 (BP Location: Right Arm, Cuff Size: Large)   Pulse (!) 105   Temp 98.3 F (36.8 C) (Oral)   Resp 16   Ht 5\' 5"  (1.651 m)   Wt 230 lb 12.8 oz (104.7 kg)   SpO2 98% Comment: room air  BMI 38.41 kg/m       Objective:   Physical Exam  Constitutional: She is oriented to person, place, and time. She appears well-developed and well-nourished.  HENT:  Head: Normocephalic and atraumatic.  Right Ear: Tympanic membrane and ear canal normal.  Left Ear: Tympanic membrane and ear canal normal.  Nose: Right sinus exhibits no maxillary sinus tenderness and no frontal sinus tenderness. Left sinus exhibits no maxillary sinus tenderness and no frontal sinus tenderness.  Cardiovascular: Normal rate, regular rhythm and normal heart sounds.   No murmur heard. Pulmonary/Chest: Effort normal and breath sounds normal. No respiratory distress. She has no wheezes.  Musculoskeletal: She exhibits no edema.  Neurological: She is alert and oriented to person, place, and time.  Psychiatric: She has a normal mood and affect. Her behavior is normal. Judgment and thought content normal.          Assessment & Plan:  Environmental allergies- has had allergic reaction to dust.  She is requesting rx for short course of prednisone because historically "It is the only thing that helps when I get like this." will rx with prednisone, advised pt to continue allegra, astelin and add in nasacort for the next few weeks.  She is advised to call if new/worsening symptoms.

## 2016-02-16 NOTE — Progress Notes (Signed)
Pre visit review using our clinic review tool, if applicable. No additional management support is needed unless otherwise documented below in the visit note. 

## 2016-02-16 NOTE — Therapy (Signed)
[facility] [address]  [place], [place], [address] Phone: [phone]   Fax:  [phone]  Physical Therapy Treatment  Patient Details  Name: [name] [name] MRN: [id number] Date of Birth: [birth year] Referring Provider: Dr. [name]   Encounter Date: 02/16/2016      PT End of Session - 02/16/16 1405    Visit Number 9   Number of Visits 16   Date for PT Re-Evaluation 03/16/16   PT Start Time 2025   PT Stop Time 1445   PT Time Calculation (min) 46 min   Activity Tolerance Patient tolerated treatment well   Behavior During Therapy [facility] for tasks assessed/performed      Past Medical History:  Diagnosis Date  . Allergy   . Anemia    takes iron supplement  . Anxiety   . Arthritis   . 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 Elevated alkaline phosphatase level    negative workup (presumed secondary to fatty liver)  . Frequency-urgency syndrome   . GERD (gastroesophageal reflux disease)   . Interstitial cystitis    Dr Vernie Shanks  . Neuromuscular disorder (HCC)    rt arm carpal tunnel syndrome  . Obesity    status post bariatric procedure in [facility] 2005  . Osteoporosis   . Thyroid nodule 7/10   `  . Thyroid nodule   . Varicose vein of leg     Past Surgical History:  Procedure Laterality Date  . ANTERIOR CERVICAL DECOMP/DISCECTOMY FUSION N/A 06/21/2012   Procedure: ANTERIOR CERVICAL DECOMPRESSION/DISCECTOMY FUSION C-4 - C5 (SPACER/DePUY CERVICAL PLATE ONLY) 1 LEVEL;  Surgeon: Melina Schools, MD;  Location: [place];  Service: Orthopedics;  Laterality: N/A;  . BLADDER SURGERY  02/21/12 and 02/28/12   Dr Diona Fanti  . COLPOSCOPY  1980  . CRYOTHERAPY  1980  . CYSTO WITH HYDRODISTENSION  12/12/2011   Procedure: CYSTOSCOPY/HYDRODISTENSION;  Surgeon: Franchot Gallo, MD;  Location: [facility];  Service: Urology;  Laterality: N/A;  30 MIN   . EYE SURGERY  1997   bilateral cataract extraction  .  FOOT SURGERY     bilateral bunionettes  . FOOT SURGERY Right 01/07/14   Pt states surgery was due to arthritis. "Has pins in place now"  . GASTRIC BYPASS  2005  . JOINT REPLACEMENT  2008   left total knee  . KNEE SURGERY  1997   left knee  . NASAL SINUS SURGERY  4270   Dr Erik Obey  . REFRACTIVE SURGERY  2006   lasik  . SHOULDER SURGERY  2 2012   RIGHT   . STOMACH SURGERY  2007   "tummy tuck"  . TOTAL KNEE ARTHROPLASTY Right 12/31/2015   Procedure: RIGHT TOTAL KNEE ARTHROPLASTY;  Surgeon: [name], MD;  Location: [facility];  Service: Orthopedics;  Laterality: Right;  Adductor Block  . VARICOSE VEIN SURGERY Right 02/2012   leg    There were no vitals filed for this visit.      Subjective Assessment - 02/16/16 1402    Subjective Pt. 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  Pt. voice very weak today due to this flare up.      Patient Stated Goals reduce pain, improve walking   Currently in Pain? No/denies   Pain Score 0-No pain   Multiple Pain Sites No            [facility] PT Assessment - 02/16/16 1405  Assessment   Referring Provider Dr. [name]    Next MD Visit ~ middle of Feb             [facility] Adult PT Treatment/Exercise - 02/16/16 1413      Knee/Hip Exercises: Aerobic   Nustep level 6 x 2 minutes      Knee/Hip Exercises: Machines for Strengthening   Cybex Knee Extension 25# x 15 reps; B concentric/R eccentric    Cybex Knee Flexion 35# x 15 reps; B concentric/R eccentric      Knee/Hip Exercises: Standing   Functional Squat 15 reps  mirror training used for even wt. shift    Functional Squat Limitations TRX      Knee/Hip Exercises: Seated   Long Arc Quad Strengthening;Right;15 reps;Weights   Long Arc Quad Weight 3 lbs.  with adduction ball squeeze   Other Seated Knee/Hip Exercises Fitter leg press - (2 black band) 15 reps      Knee/Hip Exercises: Supine   Bridges Strengthening;Both;15  reps;2 sets   Bridges Limitations blue tband at knees  2nd set with R unilateral hip abd/ER with blue TB    Straight Leg Raises Strengthening;Right;10 reps   Straight Leg Raises Limitations 3# - fatigues quickly   Straight Leg Raise with External Rotation AROM;1 set;Strengthening;Right   Straight Leg Raise with External Rotation Limitations 3#    Other Supine Knee/Hip Exercises Flexion stretch with heels on peanut p-ball 5" x 20 reps      Knee/Hip Exercises: Sidelying   Hip ABduction AROM;Strengthening;1 set;Right   Hip ABduction Limitations 2#     Manual Therapy   Manual Therapy Passive ROM;Joint mobilization   Manual therapy comments passive knee flexion/extension stretch from therapist 5" x 15 reps each   with patellar mobs and ant/posterior joint mobs                   PT Short Term Goals - 02/11/16 1426      PT SHORT TERM GOAL #1   Title patient to be independent with HEP (03/16/16)   Status Partially Met  12.28.17: met for current HEP      PT SHORT TERM GOAL #2   Title Patient to improve R knee PROM equal to that of L knee (03/16/16)   Status Partially Met  12.28.17: R knee PROM 0-103 dg           PT Long Term Goals - 02/11/16 1446      PT LONG TERM GOAL #1   Title patient to be independent with advacned HEP (03/16/16)   Status On-going     PT LONG TERM GOAL #2   Title Patient to improve R knee AROM equal to that of L knee (03/16/16)   Status Partially Met  12.28.17: R knee AROM 2-95 dg, L knee ROM -1-100 dg (measured on eval.)     PT LONG TERM GOAL #3   Title Patient to improve R LE strength to >/= 4/5 (03/16/16)   Status Partially Met  12.28.17: met for knee strength not met for hip flexion, extension still 4-/5     PT LONG TERM GOAL #4   Title Patient to demonstrate normal gait mechanics with good heel strike and toe off without use of AD (03/16/16)   Status Partially Met  12.28.17: Pt. only with slight limb on R with good heel strike, now pain  free, no AD     PT LONG TERM GOAL #5   Title Patient to demonstrate SLR  with no extensor lag (03/16/16)   Status On-going  12.28.17: still with 2 dg extensor lag               Plan - 02/16/16 1411    Clinical Impression Statement Pt. seen initially today pain free in knee however very hoarse following allergy flare up with weakened voice.  Pt. reporting MD wants her to continue to focus on increasing flexion ROM and advised pt. not to return to work yet.  Significant time taken today with knee flexion stretches, mobs, and patellar mobs to improve ROM.  Pt. demonstrating improved wt. shift with squatting activity today and able to progress resistance with majority of therex.  Pt. with poor adherence to HEP this past week due to allergy flare-up however reporting she will consistently get the HEP in coming weeks.     PT Treatment/Interventions ADLs/Self Care Home Management;Cryotherapy;Electrical Stimulation;Moist Heat;Neuromuscular re-education;Balance training;Therapeutic exercise;Therapeutic activities;Functional mobility training;Stair training;Gait training;Patient/family education;Manual techniques;Passive range of motion;Vasopneumatic Device;Taping   PT Next Visit Plan FOTO; Progress R knee strength/ROM      Patient will benefit from skilled therapeutic intervention in order to improve the following deficits and impairments:  Abnormal gait, Decreased activity tolerance, Decreased balance, Decreased range of motion, Decreased mobility, Decreased strength, Increased fascial restricitons, Pain, Difficulty walking  Visit Diagnosis: Acute pain of right knee  Stiffness of right knee, not elsewhere classified  Difficulty in walking, not elsewhere classified  Muscle weakness (generalized)     Problem List Patient Active Problem List   Diagnosis Date Noted  . Primary osteoarthritis of right knee 12/31/2015  . Spinal stenosis of lumbar region 12/31/2015  . S/P TKR (total knee  replacement), right 12/31/2015  . Diverticulitis of intestine without perforation or abscess without bleeding 09/18/2013  . Routine general medical examination at a health care facility 06/28/2013  . Overactive bladder 01/04/2013  . Onychomycosis 09/19/2012  . Right bundle branch block 03/07/2012  . Anemia 03/03/2011  . Rosacea 10/30/2010  . THYROID NODULE, RIGHT 09/10/2008  . THYROID STIMULATING HORMONE, ABNORMAL 09/08/2008  . Vitamin D deficiency 09/08/2008  . ALKALINE PHOSPHATASE, ELEVATED 09/08/2008  . OSTEOPOROSIS 08/21/2008  . BARIATRIC SURGERY STATUS 08/21/2008  . MORBID OBESITY 02/27/2007  . Anxiety state 02/27/2007  . Essential hypertension 02/27/2007  . HEMORRHOIDS 02/27/2007  . VENOUS INSUFFICIENCY 02/27/2007  . ALLERGIC RHINITIS 02/27/2007  . GERD 02/27/2007  . DEGENERATIVE JOINT DISEASE 02/27/2007    Bess Harvest, PTA 02/16/16 4:13 PM  [facility] [address]  [place], [place], [address] Phone: [phone]   Fax:  [phone]  Name: SONG MYRE MRN: [id number] Date of Birth: 08-27-1955

## 2016-02-18 ENCOUNTER — Ambulatory Visit: Payer: BLUE CROSS/BLUE SHIELD

## 2016-02-18 DIAGNOSIS — R262 Difficulty in walking, not elsewhere classified: Secondary | ICD-10-CM

## 2016-02-18 DIAGNOSIS — M25561 Pain in right knee: Secondary | ICD-10-CM

## 2016-02-18 DIAGNOSIS — R2689 Other abnormalities of gait and mobility: Secondary | ICD-10-CM

## 2016-02-18 DIAGNOSIS — M6281 Muscle weakness (generalized): Secondary | ICD-10-CM

## 2016-02-18 DIAGNOSIS — M25661 Stiffness of right knee, not elsewhere classified: Secondary | ICD-10-CM

## 2016-02-18 NOTE — Therapy (Signed)
[facility] [address]  [place], [place], [address] Phone: [phone]   Fax:  [phone]  Physical Therapy Treatment  Patient Details  Name: [name] [name] MRN: [id number] Date of Birth: [birth year] Referring Provider: Dr. [name]   Encounter Date: 02/18/2016      PT End of Session - 02/18/16 1407    Visit Number 10   Number of Visits 16   Date for PT Re-Evaluation 03/16/16   PT Start Time 4174  pt. arrived late    PT Stop Time 1445   PT Time Calculation (min) 41 min   Activity Tolerance Patient tolerated treatment well   Behavior During Therapy WFL for tasks assessed/performed      Past Medical History:  Diagnosis Date  . Allergy   . Anemia    takes iron supplement  . Anxiety   . Arthritis   . 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 Elevated alkaline phosphatase level    negative workup (presumed secondary to fatty liver)  . Frequency-urgency syndrome   . GERD (gastroesophageal reflux disease)   . Interstitial cystitis    Dr Vernie Shanks  . Neuromuscular disorder (HCC)    rt arm carpal tunnel syndrome  . Obesity    status post bariatric procedure in [facility] 2005  . Osteoporosis   . Thyroid nodule 7/10   `  . Thyroid nodule   . Varicose vein of leg     Past Surgical History:  Procedure Laterality Date  . ANTERIOR CERVICAL DECOMP/DISCECTOMY FUSION N/A 06/21/2012   Procedure: ANTERIOR CERVICAL DECOMPRESSION/DISCECTOMY FUSION C-4 - C5 (SPACER/DePUY CERVICAL PLATE ONLY) 1 LEVEL;  Surgeon: Melina Schools, MD;  Location: [place];  Service: Orthopedics;  Laterality: N/A;  . BLADDER SURGERY  02/21/12 and 02/28/12   Dr Diona Fanti  . COLPOSCOPY  1980  . CRYOTHERAPY  1980  . CYSTO WITH HYDRODISTENSION  12/12/2011   Procedure: CYSTOSCOPY/HYDRODISTENSION;  Surgeon: Franchot Gallo, MD;  Location: [facility];  Service: Urology;  Laterality: N/A;  30 MIN   . EYE SURGERY  1997   bilateral  cataract extraction  . FOOT SURGERY     bilateral bunionettes  . FOOT SURGERY Right 01/07/14   Pt states surgery was due to arthritis. "Has pins in place now"  . GASTRIC BYPASS  2005  . JOINT REPLACEMENT  2008   left total knee  . KNEE SURGERY  1997   left knee  . NASAL SINUS SURGERY  0814   Dr Erik Obey  . REFRACTIVE SURGERY  2006   lasik  . SHOULDER SURGERY  2 2012   RIGHT   . STOMACH SURGERY  2007   "tummy tuck"  . TOTAL KNEE ARTHROPLASTY Right 12/31/2015   Procedure: RIGHT TOTAL KNEE ARTHROPLASTY;  Surgeon: [name], MD;  Location: [facility];  Service: Orthopedics;  Laterality: Right;  Adductor Block  . VARICOSE VEIN SURGERY Right 02/2012   leg    There were no vitals filed for this visit.      Subjective Assessment - 02/18/16 1430    Subjective Pt. reporting she is to return to work on 1.15.18 for 4hrs/day work.   Patient Stated Goals reduce pain, improve walking   Currently in Pain? No/denies   Pain Score 0-No pain   Multiple Pain Sites No            [facility] PT Assessment - 02/18/16 1429      Assessment   Referring Provider  Dr. [name]    Next MD Visit 04/01/16     Observation/Other Assessments   Focus on Therapeutic Outcomes (FOTO)  44% (56% limitation      PROM   PROM Assessment Site Knee   Right/Left Knee Right   Right Knee Extension 0   Right Knee Flexion 103          [facility] Adult PT Treatment/Exercise - 02/18/16 1428      Knee/Hip Exercises: Stretches   Other Knee/Hip Stretches Standing R knee lunge stretch on 8" step 5 x 30 sec   2 UE support on chair; added to HEP      Knee/Hip Exercises: Aerobic   Stationary Bike Recumbent bike: full revolutions x 6 min       Knee/Hip Exercises: Standing   Forward Step Up Step Height: 6";Right;15 reps   Forward Step Up Limitations 1 UE support   Step Down 1 set;Step Height: 4";15 reps;Right  1 UE support      Knee/Hip Exercises: Seated   Long Arc Quad Strengthening;Right;15 reps;Weights   Long  Arc Quad Weight 4 lbs.  with adduction ball squeeze    Other Seated Knee/Hip Exercises Fitter leg press - (2 black band) 20 reps      Knee/Hip Exercises: Supine   Other Supine Knee/Hip Exercises Flexion stretch with heels on peanut p-ball 5" x 10 reps with heavy therapist overpressure stretch      Manual Therapy   Manual Therapy Passive ROM;Joint mobilization;Soft tissue mobilization   Manual therapy comments passive knee flexion/extension stretch from therapist 5" x 15 reps each    Joint Mobilization anterior/posterior mobs, patellar mobs,    Soft tissue mobilization Scar tissue massage demonstration and rationale                 PT Education - 02/18/16 1509    Education provided Yes   Education Details Knee lunge flexion stretch   Person(s) Educated Patient   Methods Explanation;Demonstration;Verbal cues;Handout   Comprehension Verbalized understanding;Returned demonstration;Need further instruction;Verbal cues required          PT Short Term Goals - 02/11/16 1426      PT SHORT TERM GOAL #1   Title patient to be independent with HEP (03/16/16)   Status Partially Met  12.28.17: met for current HEP      PT SHORT TERM GOAL #2   Title Patient to improve R knee PROM equal to that of L knee (03/16/16)   Status Partially Met  12.28.17: R knee PROM 0-103 dg           PT Long Term Goals - 02/11/16 1446      PT LONG TERM GOAL #1   Title patient to be independent with advacned HEP (03/16/16)   Status On-going     PT LONG TERM GOAL #2   Title Patient to improve R knee AROM equal to that of L knee (03/16/16)   Status Partially Met  12.28.17: R knee AROM 2-95 dg, L knee ROM -1-100 dg (measured on eval.)     PT LONG TERM GOAL #3   Title Patient to improve R LE strength to >/= 4/5 (03/16/16)   Status Partially Met  12.28.17: met for knee strength not met for hip flexion, extension still 4-/5     PT LONG TERM GOAL #4   Title Patient to demonstrate normal gait mechanics  with good heel strike and toe off without use of AD (03/16/16)   Status Partially Met  12.28.17:  Pt. only with slight limb on R with good heel strike, now pain free, no AD     PT LONG TERM GOAL #5   Title Patient to demonstrate SLR with no extensor lag (03/16/16)   Status On-going  12.28.17: still with 2 dg extensor lag               Plan - 02/18/16 1442    Clinical Impression Statement  R knee PROM flexion still 103 dg and when asked regarding HEP adherence, pt. reporting inconsistent performance.   Pt. instructed on scar tissue massage today with knee flexion lunge-stretch on stair added to HEP via handout.  Pt. instructed to adhere daily to HEP to continue ROM improvement.  Pt. demonstrating improved R eccentric quad control today able to perform 4" step down with only minimal UE support.  Pt. would continue to benefit from further skilled therapy to improve R LE strength and ROM to improve functional capacity.        PT Treatment/Interventions ADLs/Self Care Home Management;Cryotherapy;Electrical Stimulation;Moist Heat;Neuromuscular re-education;Balance training;Therapeutic exercise;Therapeutic activities;Functional mobility training;Stair training;Gait training;Patient/family education;Manual techniques;Passive range of motion;Vasopneumatic Device;Taping   PT Next Visit Plan Monitor respone to standing and seated knee flexion HEP stretches; Progress R knee strength/ROM      Patient will benefit from skilled therapeutic intervention in order to improve the following deficits and impairments:  Abnormal gait, Decreased activity tolerance, Decreased balance, Decreased range of motion, Decreased mobility, Decreased strength, Increased fascial restricitons, Pain, Difficulty walking  Visit Diagnosis: Acute pain of right knee  Stiffness of right knee, not elsewhere classified  Difficulty in walking, not elsewhere classified  Muscle weakness (generalized)  Other abnormalities of gait  and mobility     Problem List Patient Active Problem List   Diagnosis Date Noted  . Primary osteoarthritis of right knee 12/31/2015  . Spinal stenosis of lumbar region 12/31/2015  . S/P TKR (total knee replacement), right 12/31/2015  . Diverticulitis of intestine without perforation or abscess without bleeding 09/18/2013  . Routine general medical examination at a health care facility 06/28/2013  . Overactive bladder 01/04/2013  . Onychomycosis 09/19/2012  . Right bundle branch block 03/07/2012  . Anemia 03/03/2011  . Rosacea 10/30/2010  . THYROID NODULE, RIGHT 09/10/2008  . THYROID STIMULATING HORMONE, ABNORMAL 09/08/2008  . Vitamin D deficiency 09/08/2008  . ALKALINE PHOSPHATASE, ELEVATED 09/08/2008  . OSTEOPOROSIS 08/21/2008  . BARIATRIC SURGERY STATUS 08/21/2008  . MORBID OBESITY 02/27/2007  . Anxiety state 02/27/2007  . Essential hypertension 02/27/2007  . HEMORRHOIDS 02/27/2007  . VENOUS INSUFFICIENCY 02/27/2007  . ALLERGIC RHINITIS 02/27/2007  . GERD 02/27/2007  . DEGENERATIVE JOINT DISEASE 02/27/2007    Bess Harvest, PTA 02/18/16 3:16 PM  [facility] [address]  [place], [place], [address] Phone: [phone]   Fax:  [phone]  Name: BRIHANA QUICKEL MRN: [id number] Date of Birth: [birth year]

## 2016-02-22 ENCOUNTER — Ambulatory Visit (INDEPENDENT_AMBULATORY_CARE_PROVIDER_SITE_OTHER): Payer: BLUE CROSS/BLUE SHIELD | Admitting: Family Medicine

## 2016-02-22 ENCOUNTER — Telehealth: Payer: Self-pay | Admitting: Family

## 2016-02-22 ENCOUNTER — Ambulatory Visit: Payer: BLUE CROSS/BLUE SHIELD | Admitting: Family

## 2016-02-22 ENCOUNTER — Encounter: Payer: Self-pay | Admitting: Family Medicine

## 2016-02-22 VITALS — BP 128/76 | HR 95 | Temp 97.7°F | Ht 65.0 in | Wt 229.2 lb

## 2016-02-22 DIAGNOSIS — J0100 Acute maxillary sinusitis, unspecified: Secondary | ICD-10-CM | POA: Diagnosis not present

## 2016-02-22 MED ORDER — AMOXICILLIN-POT CLAVULANATE 875-125 MG PO TABS: 1.0000 | ORAL_TABLET | Freq: Two times a day (BID) | ORAL | 0 refills | Status: DC

## 2016-02-22 NOTE — Telephone Encounter (Signed)
Patient was seen in the office.

## 2016-02-22 NOTE — Telephone Encounter (Signed)
Patient was seen on 02/16/16 for allergies. She states that now she is having sinus issues with a headache and congestion. She had an appointment scheduled with Melissa for 4:00 on 02/22/16 but due to the weather she does not think she will make it. She has rescheduled for 11:00 with Dr. Nani Ravens but would like to know if Lenna Sciara would just call something in instead of having her come in for an appointment. Please advise   Phone: [phone]  Pharmacy: [id number] [place], [place] [facility] [address]

## 2016-02-22 NOTE — Progress Notes (Signed)
Pre visit review using our clinic review tool, if applicable. No additional management support is needed unless otherwise documented below in the visit note. 

## 2016-02-22 NOTE — Patient Instructions (Addendum)
Keep using Allegra and nasal sprays.  Keep covering mouth, practicing good hand hygiene, and pushing fluids.

## 2016-02-22 NOTE — Progress Notes (Signed)
Chief Complaint  Patient presents with  . Sinusitis    Pt reports congestion, cough and chills with ear pain     [name] [name] is 61 y.o. female here for possible sinus infection.  Duration: 11 days Symptoms include: nasal congestion, purulent rhinorrhea, cough, facial pain Denies: fevers, sore throats, myalgias, SOB Treatment to date: prednisone, INCS, Allegra Sick contacts? No  ROS:  Const: Denies fevers HEENT: As noted in HPI  Past Medical History:  Diagnosis Date  . Allergy   . Anemia    takes iron supplement  . Anxiety   . Arthritis   . 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 Elevated alkaline phosphatase level    negative workup (presumed secondary to fatty liver)  . Frequency-urgency syndrome   . GERD (gastroesophageal reflux disease)   . Interstitial cystitis    Dr Vernie Shanks  . Neuromuscular disorder (HCC)    rt arm carpal tunnel syndrome  . Obesity    status post bariatric procedure in [facility] 2005  . Osteoporosis   . Thyroid nodule 7/10   `  . Thyroid nodule   . Varicose vein of leg    Social History   Social History  . Marital status: Divorced   Occupational History  . [employer]   Social History Main Topics  . Smoking status: Never Smoker  . Smokeless tobacco: Never Used  . Alcohol use Yes     Comment: RARE  . Drug use: No  . Sexual activity: Yes   Family History  Problem Relation Age of Onset  . Diabetes Paternal Grandfather   . Stroke Paternal Grandfather   . Hyperlipidemia Mother   . Hypertension Mother   . Hypertension Father   . Diabetes Father   . Prostate cancer Father   . Dementia Father   . Heart disease Maternal Grandmother   . Hypertension Maternal Grandmother   . Heart disease Maternal Grandfather   . Hypertension Maternal Grandfather   . Stroke Maternal Grandfather     BP 128/76 (BP Location: Right Arm, Patient Position: Sitting, Cuff Size: Large)   Pulse 95   Temp 97.7 F (36.5 C) (Oral)   Ht 5'  5" (1.651 m)   Wt 229 lb 3.2 oz (104 kg)   SpO2 94%   BMI 38.14 kg/m  Gen- Awake, alert, appears stated age HEENT- Ears patent, TM's neg b/l, nares are patent, frontal sinuses are not TTP, max sinuses TTP b/l, MMM, pharynx is not erythematous or with exudate Heart- RRR, no murmur, no bruits Lungs- CTAB, no accessory muscle use Psych- Age appropriate judgment and insight, nml mood and affect  Acute non-recurrent maxillary sinusitis - Plan: amoxicillin-clavulanate (AUGMENTIN) 875-125 MG tablet  Orders as above. Given duration and worsening of symptoms, will treat.  Keep using INCS, Allegra. Continue to practice good hand hygiene, push fluids, and cover mouth when coughing. F/u prn. The patient voiced understanding and agreement to the plan.  Herman, DO 02/22/16 11:37 AM

## 2016-02-23 ENCOUNTER — Ambulatory Visit: Payer: BLUE CROSS/BLUE SHIELD | Admitting: Physical Therapy

## 2016-02-23 DIAGNOSIS — M6281 Muscle weakness (generalized): Secondary | ICD-10-CM

## 2016-02-23 DIAGNOSIS — M25661 Stiffness of right knee, not elsewhere classified: Secondary | ICD-10-CM

## 2016-02-23 DIAGNOSIS — R262 Difficulty in walking, not elsewhere classified: Secondary | ICD-10-CM

## 2016-02-23 DIAGNOSIS — R2689 Other abnormalities of gait and mobility: Secondary | ICD-10-CM

## 2016-02-23 DIAGNOSIS — M25561 Pain in right knee: Secondary | ICD-10-CM | POA: Diagnosis not present

## 2016-02-23 NOTE — Therapy (Signed)
[facility] [address]  [place], [place], [address] Phone: [phone]   Fax:  [phone]  Physical Therapy Treatment  Patient Details  Name: [name] [name] MRN: [id number] Date of Birth: 01-30-1956 Referring Provider: Dr. [name]   Encounter Date: 02/23/2016      PT End of Session - 02/23/16 1405    Visit Number 11   Number of Visits 16   Date for PT Re-Evaluation 03/16/16   PT Start Time 1402   PT Stop Time 1442   PT Time Calculation (min) 40 min   Activity Tolerance Patient tolerated treatment well   Behavior During Therapy [facility] for tasks assessed/performed      Past Medical History:  Diagnosis Date  . Allergy   . Anemia    takes iron supplement  . Anxiety   . Arthritis   . 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 Elevated alkaline phosphatase level    negative workup (presumed secondary to fatty liver)  . Frequency-urgency syndrome   . GERD (gastroesophageal reflux disease)   . Interstitial cystitis    Dr Vernie Shanks  . Neuromuscular disorder (HCC)    rt arm carpal tunnel syndrome  . Obesity    status post bariatric procedure in [facility] 2005  . Osteoporosis   . Thyroid nodule 7/10   `  . Thyroid nodule   . Varicose vein of leg     Past Surgical History:  Procedure Laterality Date  . ANTERIOR CERVICAL DECOMP/DISCECTOMY FUSION N/A 06/21/2012   Procedure: ANTERIOR CERVICAL DECOMPRESSION/DISCECTOMY FUSION C-4 - C5 (SPACER/DePUY CERVICAL PLATE ONLY) 1 LEVEL;  Surgeon: Melina Schools, MD;  Location: [facility];  Service: Orthopedics;  Laterality: N/A;  . BLADDER SURGERY  02/21/12 and 02/28/12   Dr Diona Fanti  . COLPOSCOPY  1980  . CRYOTHERAPY  1980  . CYSTO WITH HYDRODISTENSION  12/12/2011   Procedure: CYSTOSCOPY/HYDRODISTENSION;  Surgeon: Franchot Gallo, MD;  Location: [facility];  Service: Urology;  Laterality: N/A;  30 MIN   . EYE SURGERY  1997   bilateral cataract extraction  .  FOOT SURGERY     bilateral bunionettes  . FOOT SURGERY Right 01/07/14   Pt states surgery was due to arthritis. "Has pins in place now"  . GASTRIC BYPASS  2005  . JOINT REPLACEMENT  2008   left total knee  . KNEE SURGERY  1997   left knee  . NASAL SINUS SURGERY  4656   Dr Erik Obey  . REFRACTIVE SURGERY  2006   lasik  . SHOULDER SURGERY  2 2012   RIGHT   . STOMACH SURGERY  2007   "tummy tuck"  . TOTAL KNEE ARTHROPLASTY Right 12/31/2015   Procedure: RIGHT TOTAL KNEE ARTHROPLASTY;  Surgeon: [name], MD;  Location: [facility];  Service: Orthopedics;  Laterality: Right;  Adductor Block  . VARICOSE VEIN SURGERY Right 02/2012   leg    There were no vitals filed for this visit.      Subjective Assessment - 02/23/16 1404    Subjective Patient feeling much better today - went to MD regarding sinus infection. Making a conscious effort to walk with a better gait pattern.    Patient Stated Goals reduce pain, improve walking   Currently in Pain? No/denies   Pain Score 0-No pain            [facility] PT Assessment - 02/23/16 0001      PROM   Right Knee  Extension 0   Right Knee Flexion 108                     [facility] Adult PT Treatment/Exercise - 02/23/16 0001      Knee/Hip Exercises: Aerobic   Stationary Bike level 2 x 6 minutes     Knee/Hip Exercises: Machines for Strengthening   Cybex Knee Extension 25# x 15 reps; B concentric/R eccentric  - 2 sets     Knee/Hip Exercises: Standing   Heel Raises Both;1 set;15 reps   Hip Flexion Stengthening;Right;1 set;15 reps;Knee bent   Hip Flexion Limitations 3#   Hip Abduction Stengthening;Right;1 set;15 reps;Knee straight   Abduction Limitations 3#   Hip Extension Stengthening;Right;15 reps;Knee straight   Extension Limitations 3#   Lateral Step Up Right;1 set;15 reps;Hand Hold: 1;Step Height: 6"   Forward Step Up Right;1 set;15 reps;Hand Hold: 1;Step Height: 6"   Step Down Right;2 sets;15 reps;Hand Hold: 2;Step Height: 4"    Step Down Limitations eccentric control - difficult   Functional Squat 15 reps   Functional Squat Limitations TRX    SLS SLS on blue foam - B UE support - unable greater than 3-5 seconds; regressed to firm surface with B UE support max time up to 10-15 seconds                  PT Short Term Goals - 02/11/16 1426      PT SHORT TERM GOAL #1   Title patient to be independent with HEP (03/16/16)   Status Partially Met  12.28.17: met for current HEP      PT SHORT TERM GOAL #2   Title Patient to improve R knee PROM equal to that of L knee (03/16/16)   Status Partially Met  12.28.17: R knee PROM 0-103 dg           PT Long Term Goals - 02/11/16 1446      PT LONG TERM GOAL #1   Title patient to be independent with advacned HEP (03/16/16)   Status On-going     PT LONG TERM GOAL #2   Title Patient to improve R knee AROM equal to that of L knee (03/16/16)   Status Partially Met  12.28.17: R knee AROM 2-95 dg, L knee ROM -1-100 dg (measured on eval.)     PT LONG TERM GOAL #3   Title Patient to improve R LE strength to >/= 4/5 (03/16/16)   Status Partially Met  12.28.17: met for knee strength not met for hip flexion, extension still 4-/5     PT LONG TERM GOAL #4   Title Patient to demonstrate normal gait mechanics with good heel strike and toe off without use of AD (03/16/16)   Status Partially Met  12.28.17: Pt. only with slight limb on R with good heel strike, now pain free, no AD     PT LONG TERM GOAL #5   Title Patient to demonstrate SLR with no extensor lag (03/16/16)   Status On-going  12.28.17: still with 2 dg extensor lag               Plan - 02/23/16 1405    Clinical Impression Statement Patient today with improved ROM - up to 108 degrees of R knee flexion. Patient doing well with all strengthening tasks, however is visibly deficient with quad strength with slight limp with walking as well as poor eccentric control as evidenced with step down activity.  Patient fearful of step  up and lateral step up task with required UE support. SL stance poor today with education to begin at home. Patient to continue to benefit from PT to maximize function.    PT Treatment/Interventions ADLs/Self Care Home Management;Cryotherapy;Electrical Stimulation;Moist Heat;Neuromuscular re-education;Balance training;Therapeutic exercise;Therapeutic activities;Functional mobility training;Stair training;Gait training;Patient/family education;Manual techniques;Passive range of motion;Vasopneumatic Device;Taping   PT Next Visit Plan Progress R knee strength/ROM; eccentric quad strength, SL stance, stairs   Consulted and Agree with Plan of Care Patient      Patient will benefit from skilled therapeutic intervention in order to improve the following deficits and impairments:  Abnormal gait, Decreased activity tolerance, Decreased balance, Decreased range of motion, Decreased mobility, Decreased strength, Increased fascial restricitons, Pain, Difficulty walking  Visit Diagnosis: Acute pain of right knee  Stiffness of right knee, not elsewhere classified  Difficulty in walking, not elsewhere classified  Muscle weakness (generalized)  Other abnormalities of gait and mobility     Problem List Patient Active Problem List   Diagnosis Date Noted  . Primary osteoarthritis of right knee 12/31/2015  . Spinal stenosis of lumbar region 12/31/2015  . S/P TKR (total knee replacement), right 12/31/2015  . Diverticulitis of intestine without perforation or abscess without bleeding 09/18/2013  . Routine general medical examination at a health care facility 06/28/2013  . Overactive bladder 01/04/2013  . Onychomycosis 09/19/2012  . Right bundle branch block 03/07/2012  . Anemia 03/03/2011  . Rosacea 10/30/2010  . THYROID NODULE, RIGHT 09/10/2008  . THYROID STIMULATING HORMONE, ABNORMAL 09/08/2008  . Vitamin D deficiency 09/08/2008  . ALKALINE PHOSPHATASE, ELEVATED 09/08/2008   . OSTEOPOROSIS 08/21/2008  . BARIATRIC SURGERY STATUS 08/21/2008  . MORBID OBESITY 02/27/2007  . Anxiety state 02/27/2007  . Essential hypertension 02/27/2007  . HEMORRHOIDS 02/27/2007  . VENOUS INSUFFICIENCY 02/27/2007  . ALLERGIC RHINITIS 02/27/2007  . GERD 02/27/2007  . DEGENERATIVE JOINT DISEASE 02/27/2007      Lanney Gins, PT, DPT 02/23/16 4:25 PM   [facility] [address]  [place], [place], [address] Phone: [phone]   Fax:  [phone]  Name: CHEY RACHELS MRN: [id number] Date of Birth: [birth year]

## 2016-02-25 ENCOUNTER — Other Ambulatory Visit: Payer: Self-pay | Admitting: Family

## 2016-02-25 ENCOUNTER — Ambulatory Visit: Payer: BLUE CROSS/BLUE SHIELD

## 2016-02-25 DIAGNOSIS — M6281 Muscle weakness (generalized): Secondary | ICD-10-CM

## 2016-02-25 DIAGNOSIS — M25561 Pain in right knee: Secondary | ICD-10-CM | POA: Diagnosis not present

## 2016-02-25 DIAGNOSIS — M25661 Stiffness of right knee, not elsewhere classified: Secondary | ICD-10-CM

## 2016-02-25 DIAGNOSIS — R262 Difficulty in walking, not elsewhere classified: Secondary | ICD-10-CM

## 2016-02-25 DIAGNOSIS — R2689 Other abnormalities of gait and mobility: Secondary | ICD-10-CM

## 2016-02-25 NOTE — Therapy (Signed)
[facility] [address]  [place], [place], [address] Phone: [phone]   Fax:  [phone]  Physical Therapy Treatment  Patient Details  Name: [name] [name] MRN: [id number] Date of Birth: Dec 09, 1955 Referring Provider: Dr. [name]   Encounter Date: 02/25/2016      PT End of Session - 02/25/16 1410    Visit Number 12   Number of Visits 16   Date for PT Re-Evaluation 03/16/16   PT Start Time 3748   PT Stop Time 1452   PT Time Calculation (min) 48 min   Activity Tolerance Patient tolerated treatment well   Behavior During Therapy [facility] for tasks assessed/performed      Past Medical History:  Diagnosis Date  . Allergy   . Anemia    takes iron supplement  . Anxiety   . Arthritis   . 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 Elevated alkaline phosphatase level    negative workup (presumed secondary to fatty liver)  . Frequency-urgency syndrome   . GERD (gastroesophageal reflux disease)   . Interstitial cystitis    Dr Vernie Shanks  . Neuromuscular disorder (HCC)    rt arm carpal tunnel syndrome  . Obesity    status post bariatric procedure in [facility] 2005  . Osteoporosis   . Thyroid nodule 7/10   `  . Thyroid nodule   . Varicose vein of leg     Past Surgical History:  Procedure Laterality Date  . ANTERIOR CERVICAL DECOMP/DISCECTOMY FUSION N/A 06/21/2012   Procedure: ANTERIOR CERVICAL DECOMPRESSION/DISCECTOMY FUSION C-4 - C5 (SPACER/DePUY CERVICAL PLATE ONLY) 1 LEVEL;  Surgeon: Melina Schools, MD;  Location: [facility];  Service: Orthopedics;  Laterality: N/A;  . BLADDER SURGERY  02/21/12 and 02/28/12   Dr Diona Fanti  . COLPOSCOPY  1980  . CRYOTHERAPY  1980  . CYSTO WITH HYDRODISTENSION  12/12/2011   Procedure: CYSTOSCOPY/HYDRODISTENSION;  Surgeon: Franchot Gallo, MD;  Location: [facility];  Service: Urology;  Laterality: N/A;  30 MIN   . EYE SURGERY  1997   bilateral cataract extraction  .  FOOT SURGERY     bilateral bunionettes  . FOOT SURGERY Right 01/07/14   Pt states surgery was due to arthritis. "Has pins in place now"  . GASTRIC BYPASS  2005  . JOINT REPLACEMENT  2008   left total knee  . KNEE SURGERY  1997   left knee  . NASAL SINUS SURGERY  2707   Dr Erik Obey  . REFRACTIVE SURGERY  2006   lasik  . SHOULDER SURGERY  2 2012   RIGHT   . STOMACH SURGERY  2007   "tummy tuck"  . TOTAL KNEE ARTHROPLASTY Right 12/31/2015   Procedure: RIGHT TOTAL KNEE ARTHROPLASTY;  Surgeon: [name], MD;  Location: [facility];  Service: Orthopedics;  Laterality: Right;  Adductor Block  . VARICOSE VEIN SURGERY Right 02/2012   leg    There were no vitals filed for this visit.      Subjective Assessment - 02/25/16 1407    Subjective Pt. reporting she was sore following last treatment standing activity however feels she has recovered.     Patient Stated Goals reduce pain, improve walking   Currently in Pain? Yes   Pain Score 1    Pain Location Knee   Pain Orientation Right   Pain Descriptors / Indicators Aching;Throbbing   Pain Type Surgical pain   Pain Onset More than a month ago  Pain Frequency Constant   Multiple Pain Sites No             [facility] Adult PT Treatment/Exercise - 02/25/16 1416      Knee/Hip Exercises: Aerobic   Stationary Bike level 2 x 6 minutes  full revolutions     Knee/Hip Exercises: Standing   Hip Flexion Stengthening;Right;1 set;15 reps;Knee bent   Hip Flexion Limitations 3#   Hip Abduction Stengthening;Right;1 set;15 reps;Knee straight   Abduction Limitations 3#   Hip Extension Stengthening;Right;15 reps;Knee straight   Extension Limitations 3#   Lateral Step Up Right;1 set;15 reps;Hand Hold: 1;Step Height: 6"   Lateral Step Up Limitations cues provided to prevent diagonal step down   Forward Step Up Right;1 set;15 reps;Hand Hold: 1;Step Height: 6"   Step Down Right;2 sets;15 reps;Hand Hold: 2;Step Height: 4"   Step Down Limitations cues  for "slow motion" eccentric    Functional Squat 15 reps   Functional Squat Limitations TRX   cues provided for even wt. shift     Knee/Hip Exercises: Supine   Straight Leg Raises Strengthening;Right;15 reps   Straight Leg Raises Limitations 3# - fatigues quickly   Straight Leg Raise with External Rotation AROM;1 set;Strengthening;Right;15 reps   Straight Leg Raise with External Rotation Limitations 3#    Other Supine Knee/Hip Exercises Flexion stretch with heels on peanut p-ball 5" x 10 reps with heavy therapist overpressure stretch            PT Short Term Goals - 02/11/16 1426      PT SHORT TERM GOAL #1   Title patient to be independent with HEP (03/16/16)   Status Partially Met  12.28.17: met for current HEP      PT SHORT TERM GOAL #2   Title Patient to improve R knee PROM equal to that of L knee (03/16/16)   Status Partially Met  12.28.17: R knee PROM 0-103 dg           PT Long Term Goals - 02/11/16 1446      PT LONG TERM GOAL #1   Title patient to be independent with advacned HEP (03/16/16)   Status On-going     PT LONG TERM GOAL #2   Title Patient to improve R knee AROM equal to that of L knee (03/16/16)   Status Partially Met  12.28.17: R knee AROM 2-95 dg, L knee ROM -1-100 dg (measured on eval.)     PT LONG TERM GOAL #3   Title Patient to improve R LE strength to >/= 4/5 (03/16/16)   Status Partially Met  12.28.17: met for knee strength not met for hip flexion, extension still 4-/5     PT LONG TERM GOAL #4   Title Patient to demonstrate normal gait mechanics with good heel strike and toe off without use of AD (03/16/16)   Status Partially Met  12.28.17: Pt. only with slight limb on R with good heel strike, now pain free, no AD     PT LONG TERM GOAL #5   Title Patient to demonstrate SLR with no extensor lag (03/16/16)   Status On-going  12.28.17: still with 2 dg extensor lag               Plan - 02/25/16 1457    Clinical Impression Statement Pt.  reporting significant muscular soreness following last treatment standing/stepping activity.  Focus on squatting/stepping activity continued today with verbal cueing provided throughout for slow eccentric control.  Pt. still very apprehensive with  4" eccentric step down activities today.  Will continue to focus on eccentric quad strengthening and stepping activity per pt. tolerance in coming visits.   PT Treatment/Interventions ADLs/Self Care Home Management;Cryotherapy;Electrical Stimulation;Moist Heat;Neuromuscular re-education;Balance training;Therapeutic exercise;Therapeutic activities;Functional mobility training;Stair training;Gait training;Patient/family education;Manual techniques;Passive range of motion;Vasopneumatic Device;Taping   PT Next Visit Plan Progress R knee strength/ROM; eccentric quad strength, SL stance, stairs      Patient will benefit from skilled therapeutic intervention in order to improve the following deficits and impairments:  Abnormal gait, Decreased activity tolerance, Decreased balance, Decreased range of motion, Decreased mobility, Decreased strength, Increased fascial restricitons, Pain, Difficulty walking  Visit Diagnosis: Acute pain of right knee  Stiffness of right knee, not elsewhere classified  Difficulty in walking, not elsewhere classified  Muscle weakness (generalized)  Other abnormalities of gait and mobility     Problem List Patient Active Problem List   Diagnosis Date Noted  . Primary osteoarthritis of right knee 12/31/2015  . Spinal stenosis of lumbar region 12/31/2015  . S/P TKR (total knee replacement), right 12/31/2015  . Diverticulitis of intestine without perforation or abscess without bleeding 09/18/2013  . Routine general medical examination at a health care facility 06/28/2013  . Overactive bladder 01/04/2013  . Onychomycosis 09/19/2012  . Right bundle branch block 03/07/2012  . Anemia 03/03/2011  . Rosacea 10/30/2010  . THYROID  NODULE, RIGHT 09/10/2008  . THYROID STIMULATING HORMONE, ABNORMAL 09/08/2008  . Vitamin D deficiency 09/08/2008  . ALKALINE PHOSPHATASE, ELEVATED 09/08/2008  . OSTEOPOROSIS 08/21/2008  . BARIATRIC SURGERY STATUS 08/21/2008  . MORBID OBESITY 02/27/2007  . Anxiety state 02/27/2007  . Essential hypertension 02/27/2007  . HEMORRHOIDS 02/27/2007  . VENOUS INSUFFICIENCY 02/27/2007  . ALLERGIC RHINITIS 02/27/2007  . GERD 02/27/2007  . DEGENERATIVE JOINT DISEASE 02/27/2007    Bess Harvest, PTA 02/25/16 3:02 PM  [facility] [address]  [place], [place], [address] Phone: [phone]   Fax:  [phone]  Name: [name] [name] MRN: [id number] Date of Birth: 1955-12-29

## 2016-03-01 ENCOUNTER — Ambulatory Visit: Payer: BLUE CROSS/BLUE SHIELD | Admitting: Physical Therapy

## 2016-03-01 DIAGNOSIS — R262 Difficulty in walking, not elsewhere classified: Secondary | ICD-10-CM

## 2016-03-01 DIAGNOSIS — M6281 Muscle weakness (generalized): Secondary | ICD-10-CM

## 2016-03-01 DIAGNOSIS — R2689 Other abnormalities of gait and mobility: Secondary | ICD-10-CM

## 2016-03-01 DIAGNOSIS — M25561 Pain in right knee: Secondary | ICD-10-CM

## 2016-03-01 DIAGNOSIS — M25661 Stiffness of right knee, not elsewhere classified: Secondary | ICD-10-CM

## 2016-03-01 NOTE — Therapy (Addendum)
[facility] [address]  [place], [place], [address] Phone: [phone]   Fax:  [phone]  Physical Therapy Treatment  Patient Details  Name: [name] [name] MRN: [id number] Date of Birth: [birth year] Referring Provider: Dr. [name]   Encounter Date: 03/01/2016      PT End of Session - 03/01/16 1457    Visit Number 13   Number of Visits 16   Date for PT Re-Evaluation 03/16/16   PT Start Time 1455  pt late   PT Stop Time 1534   PT Time Calculation (min) 39 min   Activity Tolerance Patient tolerated treatment well   Behavior During Therapy [facility] for tasks assessed/performed      Past Medical History:  Diagnosis Date  . Allergy   . Anemia    takes iron supplement  . Anxiety   . Arthritis   . 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 Elevated alkaline phosphatase level    negative workup (presumed secondary to fatty liver)  . Frequency-urgency syndrome   . GERD (gastroesophageal reflux disease)   . Interstitial cystitis    Dr Vernie Shanks  . Neuromuscular disorder (HCC)    rt arm carpal tunnel syndrome  . Obesity    status post bariatric procedure in [facility] 2005  . Osteoporosis   . Thyroid nodule 7/10   `  . Thyroid nodule   . Varicose vein of leg     Past Surgical History:  Procedure Laterality Date  . ANTERIOR CERVICAL DECOMP/DISCECTOMY FUSION N/A 06/21/2012   Procedure: ANTERIOR CERVICAL DECOMPRESSION/DISCECTOMY FUSION C-4 - C5 (SPACER/DePUY CERVICAL PLATE ONLY) 1 LEVEL;  Surgeon: Melina Schools, MD;  Location: [facility];  Service: Orthopedics;  Laterality: N/A;  . BLADDER SURGERY  02/21/12 and 02/28/12   Dr Diona Fanti  . COLPOSCOPY  1980  . CRYOTHERAPY  1980  . CYSTO WITH HYDRODISTENSION  12/12/2011   Procedure: CYSTOSCOPY/HYDRODISTENSION;  Surgeon: Franchot Gallo, MD;  Location: [facility];  Service: Urology;  Laterality: N/A;  30 MIN   . EYE SURGERY  1997   bilateral cataract  extraction  . FOOT SURGERY     bilateral bunionettes  . FOOT SURGERY Right 01/07/14   Pt states surgery was due to arthritis. "Has pins in place now"  . GASTRIC BYPASS  2005  . JOINT REPLACEMENT  2008   left total knee  . KNEE SURGERY  1997   left knee  . NASAL SINUS SURGERY  9449   Dr Erik Obey  . REFRACTIVE SURGERY  2006   lasik  . SHOULDER SURGERY  2 2012   RIGHT   . STOMACH SURGERY  2007   "tummy tuck"  . TOTAL KNEE ARTHROPLASTY Right 12/31/2015   Procedure: RIGHT TOTAL KNEE ARTHROPLASTY;  Surgeon: [name], MD;  Location[facility];  Service: Orthopedics;  Laterality: Right;  Adductor Block  . VARICOSE VEIN SURGERY Right 02/2012   leg    There were no vitals filed for this visit.      Subjective Assessment - 03/01/16 1458    Subjective pt returned to work - 1/2 day - had some stiffness, had to get up a few times    Patient Stated Goals reduce pain, improve walking   Currently in Pain? Yes   Pain Score 1    Pain Location Knee   Pain Orientation Right   Pain Descriptors / Indicators Sore;Discomfort   Pain Type Surgical pain   Pain Onset More than  a month ago   Pain Frequency Intermittent            [facility] PT Assessment - 03/01/16 1459      AROM   Left Knee Flexion 107                     [facility] Adult PT Treatment/Exercise - 03/01/16 1459      Ambulation/Gait   Stairs Yes   Stairs Assistance 6: Modified independent (Device/Increase time)   Stair Management Technique Two rails;Step to pattern   Number of Stairs 12   Height of Stairs 8     Knee/Hip Exercises: Stretches   Sports administrator Right;3 reps;60 seconds   Quad Stretch Limitations prone with strap     Knee/Hip Exercises: Aerobic   Stationary Bike level 2 x 6 minutes     Knee/Hip Exercises: Machines for Strengthening   Cybex Knee Extension 25# x 15 reps; B concentric/R eccentric  - 2 sets     Knee/Hip Exercises: Standing   Step Down Right;1 set;15 reps;Hand Hold: 2;Step Height: 6"    Step Down Limitations eccentric control   Functional Squat 15 reps   Functional Squat Limitations TRX    SLS R SL stance - 1 UE support; L foot on pebble 4 x 30 seconds     Knee/Hip Exercises: Supine   Bridges Strengthening;Both;15 reps   Straight Leg Raises Strengthening;Right;15 reps;2 sets   Straight Leg Raises Limitations 4#   Other Supine Knee/Hip Exercises bridge with B LE extended on peanut ball x 15                  PT Short Term Goals - 02/11/16 1426      PT SHORT TERM GOAL #1   Title patient to be independent with HEP (03/16/16)   Status Partially Met  12.28.17: met for current HEP      PT SHORT TERM GOAL #2   Title Patient to improve R knee PROM equal to that of L knee (03/16/16)   Status Partially Met  12.28.17: R knee PROM 0-103 dg           PT Long Term Goals - 02/11/16 1446      PT LONG TERM GOAL #1   Title patient to be independent with advacned HEP (03/16/16)   Status On-going     PT LONG TERM GOAL #2   Title Patient to improve R knee AROM equal to that of L knee (03/16/16)   Status Partially Met  12.28.17: R knee AROM 2-95 dg, L knee ROM -1-100 dg (measured on eval.)     PT LONG TERM GOAL #3   Title Patient to improve R LE strength to >/= 4/5 (03/16/16)   Status Partially Met  12.28.17: met for knee strength not met for hip flexion, extension still 4-/5     PT LONG TERM GOAL #4   Title Patient to demonstrate normal gait mechanics with good heel strike and toe off without use of AD (03/16/16)   Status Partially Met  12.28.17: Pt. only with slight limb on R with good heel strike, now pain free, no AD     PT LONG TERM GOAL #5   Title Patient to demonstrate SLR with no extensor lag (03/16/16)   Status On-going  12.28.17: still with 2 dg extensor lag               Plan - 03/01/16 1619    Clinical Impression Statement Patient today  ambulating up/down 12 steps with step to gait pattern with reduced control with descending as well as  overall fear of stairs. Patient continuing to lack eccentric quad strength requiring conitnued work with this activity. Patient improving AROM of R knee up to 107 degrees of flexion. Will begin preparing patient for independent HEP practice in the coming visits.    PT Treatment/Interventions ADLs/Self Care Home Management;Cryotherapy;Electrical Stimulation;Moist Heat;Neuromuscular re-education;Balance training;Therapeutic exercise;Therapeutic activities;Functional mobility training;Stair training;Gait training;Patient/family education;Manual techniques;Passive range of motion;Vasopneumatic Device;Taping   PT Next Visit Plan Progress R knee strength/ROM; eccentric quad strength, SL stance, stairs; begin comprehensive HEP coverage   Consulted and Agree with Plan of Care Patient      Patient will benefit from skilled therapeutic intervention in order to improve the following deficits and impairments:  Abnormal gait, Decreased activity tolerance, Decreased balance, Decreased range of motion, Decreased mobility, Decreased strength, Increased fascial restricitons, Pain, Difficulty walking  Visit Diagnosis: Acute pain of right knee  Stiffness of right knee, not elsewhere classified  Difficulty in walking, not elsewhere classified  Muscle weakness (generalized)  Other abnormalities of gait and mobility     Problem List Patient Active Problem List   Diagnosis Date Noted  . Primary osteoarthritis of right knee 12/31/2015  . Spinal stenosis of lumbar region 12/31/2015  . S/P TKR (total knee replacement), right 12/31/2015  . Diverticulitis of intestine without perforation or abscess without bleeding 09/18/2013  . Routine general medical examination at a health care facility 06/28/2013  . Overactive bladder 01/04/2013  . Onychomycosis 09/19/2012  . Right bundle branch block 03/07/2012  . Anemia 03/03/2011  . Rosacea 10/30/2010  . THYROID NODULE, RIGHT 09/10/2008  . THYROID STIMULATING  HORMONE, ABNORMAL 09/08/2008  . Vitamin D deficiency 09/08/2008  . ALKALINE PHOSPHATASE, ELEVATED 09/08/2008  . OSTEOPOROSIS 08/21/2008  . BARIATRIC SURGERY STATUS 08/21/2008  . MORBID OBESITY 02/27/2007  . Anxiety state 02/27/2007  . Essential hypertension 02/27/2007  . HEMORRHOIDS 02/27/2007  . VENOUS INSUFFICIENCY 02/27/2007  . ALLERGIC RHINITIS 02/27/2007  . GERD 02/27/2007  . DEGENERATIVE JOINT DISEASE 02/27/2007     Lanney Gins, PT, DPT 03/01/16 4:22 PM  PHYSICAL THERAPY DISCHARGE SUMMARY  Visits from Start of Care: 13  Current functional level related to goals / functional outcomes: See above   Remaining deficits: See above; eccentric quad strength   Education / Equipment: HEP  Plan: Patient agrees to discharge.  Patient goals were partially met. Patient is being discharged due to being pleased with the current functional level.  ?????    Lanney Gins, PT, DPT 03/22/16 3:42 PM   [facility] [address]  [place], [place], [address] Phone: [phone]   Fax:  [phone]  Name: ALAYZIAH TANGEMAN MRN: [id number] Date of Birth: Apr 13, 1955

## 2016-03-03 ENCOUNTER — Ambulatory Visit: Payer: BLUE CROSS/BLUE SHIELD

## 2016-03-04 ENCOUNTER — Telehealth: Payer: Self-pay | Admitting: Family

## 2016-03-04 NOTE — Telephone Encounter (Signed)
Please advise. TL/CMA 

## 2016-03-04 NOTE — Telephone Encounter (Signed)
I have spoken to patient and informed them of Dr. Nani Ravens recommendations. Pt wants to be seen and sent her to front staff for scheduling. TL/CMA

## 2016-03-04 NOTE — Telephone Encounter (Signed)
Patient was seen on 02/22/16 by Dr. Nani Ravens. She states that he gave her a 10 day antibiotic at that appointment. She has finished the antibiotic and still has the same symptoms. She states that normally a 14 day antibiotic works better for her. She was told to call the office if she wasn't better. Please advise  Phone: [phone]  [id number] [place], [place] [address]

## 2016-03-04 NOTE — Telephone Encounter (Signed)
She can continue using Allegra, Astelin, and steroid nasal spray. Return to clinic if symptoms still not improving.

## 2016-03-07 ENCOUNTER — Ambulatory Visit (INDEPENDENT_AMBULATORY_CARE_PROVIDER_SITE_OTHER): Payer: BLUE CROSS/BLUE SHIELD | Admitting: Family Medicine

## 2016-03-07 ENCOUNTER — Encounter: Payer: Self-pay | Admitting: Family Medicine

## 2016-03-07 VITALS — BP 110/76 | HR 84 | Temp 98.2°F | Resp 16 | Ht 65.0 in | Wt 232.8 lb

## 2016-03-07 DIAGNOSIS — J320 Chronic maxillary sinusitis: Secondary | ICD-10-CM

## 2016-03-07 MED ORDER — DOXYCYCLINE HYCLATE 100 MG PO TABS: 100.0000 mg | ORAL_TABLET | Freq: Two times a day (BID) | ORAL | 0 refills | Status: DC

## 2016-03-07 NOTE — Patient Instructions (Signed)
Continue with your 2 nasal sprays and Allegra.   If you are feeling better, cancel your follow up with me.

## 2016-03-07 NOTE — Progress Notes (Signed)
Pre visit review using our clinic review tool, if applicable. No additional management support is needed unless otherwise documented below in the visit note. 

## 2016-03-07 NOTE — Progress Notes (Signed)
Chief Complaint  Patient presents with  . Nasal Congestion    Pt treated for sinusitis on 02/22/16. Finished augmentin 3-4 days ago and still has sinus drainage.    [name] [name] here for URI complaints.  Duration: 25 days  Associated symptoms: sinus congestion, sinus pressure, ear pain and cough Denies: subjective fever Treatment to date: Augmentin for 10 days, got better and then worsened after abx stopped; Allegra, Nasacort, Astelin.   Sick contacts: No  ROS:  Const: Denies fevers HEENT: As noted in HPI Lungs: No SOB  Past Medical History:  Diagnosis Date  . Allergy   . Anemia    takes iron supplement  . Anxiety   . Arthritis   . 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 Elevated alkaline phosphatase level    negative workup (presumed secondary to fatty liver)  . Frequency-urgency syndrome   . GERD (gastroesophageal reflux disease)   . Interstitial cystitis    Dr Vernie Shanks  . Neuromuscular disorder (HCC)    rt arm carpal tunnel syndrome  . Obesity    status post bariatric procedure in [facility] 2005  . Osteoporosis   . Thyroid nodule 7/10   `  . Thyroid nodule   . Varicose vein of leg    Family History  Problem Relation Age of Onset  . Diabetes Paternal Grandfather   . Stroke Paternal Grandfather   . Hyperlipidemia Mother   . Hypertension Mother   . Hypertension Father   . Diabetes Father   . Prostate cancer Father   . Dementia Father   . Heart disease Maternal Grandmother   . Hypertension Maternal Grandmother   . Heart disease Maternal Grandfather   . Hypertension Maternal Grandfather   . Stroke Maternal Grandfather     BP 110/76 (BP Location: Right Arm, Cuff Size: Large)   Pulse 84   Temp 98.2 F (36.8 C) (Oral)   Resp 16   Ht 5\' 5"  (1.651 m)   Wt 232 lb 12.8 oz (105.6 kg)   SpO2 97% Comment: room air  BMI 38.74 kg/m  General: Awake, alert, appears stated age HEENT: AT, [place], ears patent b/l and TM's neg, nares patent w/o discharge, max sinuses mildly  TTP, pharynx pink and without exudates, MMM Neck: No masses or asymmetry Heart: RRR, no murmurs, no bruits Lungs: CTAB, no accessory muscle use Psych: Age appropriate judgment and insight, normal mood and affect  Maxillary sinusitis, unspecified chronicity - Plan: doxycycline (VIBRA-TABS) 100 MG tablet  Orders as above. Will repeat a 10 day course of abx. If no improvement, will refer to ENT. Continue INCS, allegra and Astelin nasal spray. Continue to push fluids, practice good hand hygiene, cover mouth when coughing. F/u in 1 week if symptoms worsen or fail to improve. Pt voiced understanding and agreement to the plan.  [place], DO 03/07/16 1:36 PM

## 2016-03-08 ENCOUNTER — Ambulatory Visit: Payer: BLUE CROSS/BLUE SHIELD

## 2016-03-09 ENCOUNTER — Other Ambulatory Visit: Payer: Self-pay | Admitting: Family

## 2016-03-09 NOTE — Telephone Encounter (Signed)
Last alprazolam RF: 02/02/16, # 60 Last OV: 02/16/16 with PCP Next OV: 03/17/16 Dr Nani Ravens UDS: 02/03/16, moderate  Please advise request?

## 2016-03-10 ENCOUNTER — Ambulatory Visit: Payer: BLUE CROSS/BLUE SHIELD | Admitting: Physical Therapy

## 2016-03-11 NOTE — Telephone Encounter (Signed)
See rx. 

## 2016-03-11 NOTE — Telephone Encounter (Signed)
Rx faxed to pharmacy, notified pt. 

## 2016-03-14 ENCOUNTER — Other Ambulatory Visit: Payer: Self-pay | Admitting: Family

## 2016-03-15 NOTE — Telephone Encounter (Signed)
Last Vit D test 12/16/15 Last rf:  12/16/15, #24  Pt has not future appts scheduled. Please advise request.

## 2016-03-16 ENCOUNTER — Telehealth: Payer: Self-pay | Admitting: Family

## 2016-03-16 NOTE — Telephone Encounter (Signed)
Please schedule patient for 6 mnth f/u ov  Thx PC

## 2016-03-16 NOTE — Telephone Encounter (Signed)
rx sent. Needs 6 month follow up please.

## 2016-03-16 NOTE — Telephone Encounter (Signed)
Pt has been scheduled.  °

## 2016-03-17 ENCOUNTER — Ambulatory Visit: Payer: BLUE CROSS/BLUE SHIELD | Admitting: Family Medicine

## 2016-03-21 ENCOUNTER — Encounter: Payer: Self-pay | Admitting: Family

## 2016-03-21 ENCOUNTER — Ambulatory Visit (INDEPENDENT_AMBULATORY_CARE_PROVIDER_SITE_OTHER): Payer: BLUE CROSS/BLUE SHIELD | Admitting: Family

## 2016-03-21 VITALS — BP 124/66 | HR 86 | Temp 97.8°F | Ht 65.0 in | Wt 231.6 lb

## 2016-03-21 DIAGNOSIS — H8110 Benign paroxysmal vertigo, unspecified ear: Secondary | ICD-10-CM | POA: Diagnosis not present

## 2016-03-21 NOTE — Progress Notes (Signed)
Subjective:    Patient ID: [name] [name], female    DOB: 04/02/1955, 61 y.o.   MRN: [id number]  HPI  [name] [name] is a 61 yr old female who presents today for follow up.   Of note, she saw Dr. Nani Ravens on 03/07/16 and was treated with a 10 day course of doxycycline.  She was advised to continue her astelin and allegra at that time. Prior to that visit she saw Dr. Nani Ravens on 02/22/16 and was treated with augmentin for sinusitis.    She reports that she is now having dizziness.  Began running a dehumidifier.  Notes that her head "spins" when she lays down.  She tried dramamine.  Notes that did not help. Then purchased meclizine which seems to help.  She has an upcoming appointment with the allergist tomorrow. Notes that her nasal drainage, hoarseness and headache are resolved.    Review of Systems See HPI  Past Medical History:  Diagnosis Date  . Allergy   . Anemia    takes iron supplement  . Anxiety   . Arthritis   . 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 Elevated alkaline phosphatase level    negative workup (presumed secondary to fatty liver)  . Frequency-urgency syndrome   . GERD (gastroesophageal reflux disease)   . Interstitial cystitis    Dr Vernie Shanks  . Neuromuscular disorder (HCC)    rt arm carpal tunnel syndrome  . Obesity    status post bariatric procedure in [facility] 2005  . Osteoporosis   . Thyroid nodule 7/10   `  . Thyroid nodule   . Varicose vein of leg      Social History   Social History  . Marital status: Divorced    Spouse name: N/A  . Number of children: 1  . Years of education: N/A   Occupational History  . [employer]   Social History Main Topics  . Smoking status: Never Smoker  . Smokeless tobacco: Never Used  . Alcohol use Yes     Comment: RARE  . Drug use: No  . Sexual activity: Yes   Other Topics Concern  . Not on file   Social History Narrative  . No narrative on file    Past Surgical History:  Procedure  Laterality Date  . ANTERIOR CERVICAL DECOMP/DISCECTOMY FUSION N/A 06/21/2012   Procedure: ANTERIOR CERVICAL DECOMPRESSION/DISCECTOMY FUSION C-4 - C5 (SPACER/DePUY CERVICAL PLATE ONLY) 1 LEVEL;  Surgeon: Melina Schools, MD;  Location: [facility];  Service: Orthopedics;  Laterality: N/A;  . BLADDER SURGERY  02/21/12 and 02/28/12   Dr Diona Fanti  . COLPOSCOPY  1980  . CRYOTHERAPY  1980  . CYSTO WITH HYDRODISTENSION  12/12/2011   Procedure: CYSTOSCOPY/HYDRODISTENSION;  Surgeon: Franchot Gallo, MD;  Location: [facility];  Service: Urology;  Laterality: N/A;  30 MIN   . EYE SURGERY  1997   bilateral cataract extraction  . FOOT SURGERY     bilateral bunionettes  . FOOT SURGERY Right 01/07/14   Pt states surgery was due to arthritis. "Has pins in place now"  . GASTRIC BYPASS  2005  . JOINT REPLACEMENT  2008   left total knee  . KNEE SURGERY  1997   left knee  . NASAL SINUS SURGERY  [id number]   Dr Erik Obey  . REFRACTIVE SURGERY  2006   lasik  . SHOULDER SURGERY  2 2012   RIGHT   . STOMACH SURGERY  2007   "tummy tuck"  .  TOTAL KNEE ARTHROPLASTY Right 12/31/2015   Procedure: RIGHT TOTAL KNEE ARTHROPLASTY;  Surgeon: [name], MD;  Location[facility];  Service: Orthopedics;  Laterality: Right;  Adductor Block  . VARICOSE VEIN SURGERY Right 02/2012   leg    Family History  Problem Relation Age of Onset  . Diabetes Paternal Grandfather   . Stroke Paternal Grandfather   . Hyperlipidemia Mother   . Hypertension Mother   . Hypertension Father   . Diabetes Father   . Prostate cancer Father   . Dementia Father   . Heart disease Maternal Grandmother   . Hypertension Maternal Grandmother   . Heart disease Maternal Grandfather   . Hypertension Maternal Grandfather   . Stroke Maternal Grandfather     Allergies  Allergen Reactions  . 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  No allergy.  . Nitrofurantoin     REACTION: fever  . Prednisone      REACTION: palpitations.  06/1715  Pt states she can now tolerate prednisone.    Current Outpatient Prescriptions on File Prior to Visit  Medication Sig Dispense Refill  . acetaminophen (TYLENOL) 650 MG CR tablet Take 2 tablets (1,300 mg total) by mouth every 8 (eight) hours as needed for pain. Take for mild pain. Do not combine with Percocet. Do not exceed daily recommended limit of Tylenol.    . ALPRAZolam (XANAX) 0.5 MG tablet TAKE 1 TABLET BY MOUTH TWICE A DAY 60 tablet 0  . amitriptyline (ELAVIL) 50 MG tablet TAKE 1 TABLET BY MOUTH EVERY EVENING  3  . azelastine (ASTELIN) 0.1 % nasal spray Place 1 spray into both nostrils daily. Use in each nostril as directed    . Calcium Carbonate Antacid (TUMS ULTRA 1000 PO) Take 1,000 mg by mouth 2 (two) times daily.    . cycloSPORINE (RESTASIS) 0.05 % ophthalmic emulsion Place 1 drop into both eyes 2 (two) times daily.    Marland Kitchen doxycycline (VIBRA-TABS) 100 MG tablet Take 1 tablet (100 mg total) by mouth 2 (two) times daily. 20 tablet 0  . ferrous sulfate 325 (65 FE) MG tablet Take 1 tablet (325 mg total) by mouth 2 (two) times daily. (Patient taking differently: Take 325 mg by mouth 2 (two) times daily. Has stopped prior to procedure) 30 tablet 5  . fexofenadine (ALLEGRA) 180 MG tablet Take 180 mg by mouth every evening.     Marland Kitchen MYRBETRIQ 50 MG TB24 tablet Take 50 mg by mouth every evening.   2  . omeprazole (PRILOSEC) 40 MG capsule TAKE 1 CAPSULE (40 MG TOTAL) BY MOUTH DAILY. (Patient taking differently: Take 40 mg by mouth every evening. ) 90 capsule 1  . OVER THE COUNTER MEDICATION Place 1 drop into both eyes 2 (two) times daily. Allergy eye drops    . oxyCODONE-acetaminophen (PERCOCET) 10-325 MG tablet Take 1 tablet by mouth every 4 (four) hours as needed for pain (severe). 40 tablet 0  . PARoxetine (PAXIL) 30 MG tablet TAKE 1 TABLET BY MOUTH EVERY MORNING 90 tablet 1  . valACYclovir (VALTREX) 500 MG tablet TAKE 1 TABLET BY MOUTH EVERY DAY. NEEDS APPT 30  tablet 0  . Vitamin D, Ergocalciferol, (DRISDOL) 50000 units CAPS capsule TAKE 1 CAPSULE (50,000 UNITS TOTAL) BY MOUTH 2 (TWO) TIMES A WEEK. 24 capsule 0   No current facility-administered medications on file prior to visit.     BP 124/66 (BP Location: Right Arm, Cuff Size: Large)   Pulse 86  Temp 97.8 F (36.6 C) (Oral)   Ht 5\' 5"  (1.651 m)   Wt 231 lb 9.6 oz (105.1 kg)   SpO2 100%   BMI 38.54 kg/m       Objective:   Physical Exam  Constitutional: She is oriented to person, place, and time. She appears well-developed and well-nourished.  HENT:  Head: Normocephalic and atraumatic.  Right Ear: Tympanic membrane and ear canal normal.  Left Ear: Tympanic membrane and ear canal normal.  Cardiovascular: Normal rate, regular rhythm and normal heart sounds.   No murmur heard. Pulmonary/Chest: Effort normal and breath sounds normal. No respiratory distress. She has no wheezes.  Musculoskeletal: She exhibits no edema.  Neurological: She is alert and oriented to person, place, and time.  Psychiatric: She has a normal mood and affect. Her behavior is normal. Judgment and thought content normal.          Assessment & Plan:  BPV- mild vertigo symptoms, relieved by PRN meclizine.  Advised pt as follows:  Continue meclizine as needed, allegra, nasal sprays. Call if dizziness worsens or if it is not improved in 1 week.

## 2016-03-21 NOTE — Progress Notes (Signed)
Pre visit review using our clinic review tool, if applicable. No additional management support is needed unless otherwise documented below in the visit note. 

## 2016-03-21 NOTE — Patient Instructions (Signed)
Continue meclizine as needed, allegra, nasal sprays. Call if dizziness worsens or if it is not improved in 1 week.

## 2016-03-22 ENCOUNTER — Ambulatory Visit: Payer: Self-pay | Admitting: Allergy and Immunology

## 2016-04-14 ENCOUNTER — Other Ambulatory Visit: Payer: Self-pay | Admitting: Family

## 2016-04-14 NOTE — Telephone Encounter (Signed)
Rx phoned in to [place] at [place] #60 and 0RF.

## 2016-04-14 NOTE — Telephone Encounter (Signed)
Ok to send #60 zero refills.  

## 2016-04-14 NOTE — Telephone Encounter (Signed)
Pt is requesting refill on Alprazolam.  Last OV: 03/21/2016 Last Fill: 03/11/2016 #60 and 0RF UDS: 08/11/2015 Low risk  Please advise.

## 2016-04-19 ENCOUNTER — Other Ambulatory Visit: Payer: Self-pay | Admitting: Family

## 2016-05-23 ENCOUNTER — Other Ambulatory Visit: Payer: Self-pay | Admitting: Family

## 2016-05-24 NOTE — Telephone Encounter (Signed)
Last Rx in EMR showing that Rx for #30 with 5 refills was sent to [place]; patient would then have available refills, [facility]'s [facility] and stated that they had never received the Rx that was sent on 03/09/16. Informed that we would send new electronic script for Ferrous sulfate to the pharmacy, understood & agreed/SLS 04/10

## 2016-05-27 ENCOUNTER — Other Ambulatory Visit: Payer: Self-pay | Admitting: Family

## 2016-05-27 NOTE — Telephone Encounter (Signed)
Requesting:[place] Contract: 02/18/14 UDS:02/04/16  Last OV:03/21/16 Last Refill: 04/14/16  #[id number] Next VB:TYOM  Please Advise

## 2016-05-31 ENCOUNTER — Encounter: Payer: Self-pay | Admitting: Gastroenterology

## 2016-06-01 NOTE — Telephone Encounter (Signed)
Please ask pt to pick up refill. She will need to sign updated controlled substance contract and provide UDS at time of pick up.

## 2016-06-01 NOTE — Telephone Encounter (Signed)
Rx and contract placed at front desk. Mychart message sent to pt.

## 2016-06-04 ENCOUNTER — Other Ambulatory Visit: Payer: Self-pay | Admitting: Family

## 2016-06-06 NOTE — Telephone Encounter (Signed)
[facility] on Vit D2 50000 IU [twice weekly] Last filled - 03/16/16, #[id number] Last AEX - 03/21/16 Next AEX - 6-Mths. Last Lab: 12/16/15, result [id number] Please Advise on refills/SLS 04/23

## 2016-06-15 NOTE — Telephone Encounter (Signed)
Received notice that mychart message from 4/18 has not been read. Pt states she was unable to pick up Rx last week due to a UTI and her mom is now in the hospital. Advised pt she could pick up Rx as early as 7am if that will work better with her schedule and she voices understanding.

## 2016-06-16 ENCOUNTER — Encounter: Payer: Self-pay | Admitting: Family

## 2016-07-14 ENCOUNTER — Other Ambulatory Visit: Payer: Self-pay | Admitting: Family

## 2016-07-15 NOTE — Telephone Encounter (Signed)
Last alprazolam RX: 06/01/16, #60  Last OV: 03/21/16 Next OV: 09/18/16 UDS: 06/16/16 low risk and due 12/27/16.  Rx printed and forwarded to PCP for signature.  Prozac should still have 1 refill remaining at pharmacy and denial with note was sent to see rx on file from 02/26/16.

## 2016-07-19 NOTE — Telephone Encounter (Signed)
Rx was faxed to pharmacy on 07/15/16 at 4:50pm.

## 2016-07-22 ENCOUNTER — Other Ambulatory Visit: Payer: Self-pay | Admitting: Family

## 2016-07-25 ENCOUNTER — Encounter: Payer: Self-pay | Admitting: Family

## 2016-07-25 MED ORDER — ALPRAZOLAM 0.5 MG PO TABS: 0.5000 mg | ORAL_TABLET | Freq: Two times a day (BID) | ORAL | 0 refills | Status: DC

## 2016-07-25 MED ORDER — ALPRAZOLAM 0.5 MG PO TABS
0.5000 mg | ORAL_TABLET | Freq: Two times a day (BID) | ORAL | 0 refills | Status: DC
Start: 2016-07-25 — End: 2016-08-29

## 2016-08-29 ENCOUNTER — Other Ambulatory Visit: Payer: Self-pay | Admitting: Family

## 2016-08-30 ENCOUNTER — Telehealth: Payer: Self-pay | Admitting: *Deleted

## 2016-08-30 NOTE — Telephone Encounter (Signed)
Last alprazolam RX:  07/25/16, #60 Last OV: 03/21/16 Next OV: due 09/18/16 but not scheduled yet UDS: 06/16/16 and due 12/17/16  Rx printed and forwarded to PCP for signature.

## 2016-08-30 NOTE — Telephone Encounter (Signed)
Received call from pharmacist[facility] stating pt is being prescribed amitriptyline by another MD (currently on pt med list) and we refilled pt's Paxil. Reports potential interaction is showing up for possible serotonin syndrome and he wants authorization to refill Paxil.  Please advise?

## 2016-08-31 NOTE — Telephone Encounter (Signed)
OK to fill paxil as I feel benefit >risk in this patient.

## 2016-08-31 NOTE — Telephone Encounter (Signed)
Notified pharmacist, RoseAnna at [place].

## 2016-08-31 NOTE — Telephone Encounter (Signed)
Rx faxed to pharmacy  

## 2016-10-03 ENCOUNTER — Other Ambulatory Visit: Payer: Self-pay | Admitting: Family

## 2016-10-04 NOTE — Telephone Encounter (Signed)
Last alprazolam RX: 08/30/16 #60 Last OV:  03/21/16 Next OV: past due 09/18/16 UDS: 06/16/16, low and due 12/17/16  Rx printed and forwarded to PCP for signature. Will need office visit for future refills.

## 2016-10-07 ENCOUNTER — Encounter: Payer: Self-pay | Admitting: Family Medicine

## 2016-10-07 ENCOUNTER — Ambulatory Visit (INDEPENDENT_AMBULATORY_CARE_PROVIDER_SITE_OTHER): Payer: BLUE CROSS/BLUE SHIELD | Admitting: Family Medicine

## 2016-10-07 VITALS — BP 122/80 | HR 110 | Temp 98.4°F | Ht 64.5 in | Wt 244.2 lb

## 2016-10-07 DIAGNOSIS — H6591 Unspecified nonsuppurative otitis media, right ear: Secondary | ICD-10-CM

## 2016-10-07 DIAGNOSIS — H6983 Other specified disorders of Eustachian tube, bilateral: Secondary | ICD-10-CM | POA: Diagnosis not present

## 2016-10-07 DIAGNOSIS — Z9109 Other allergy status, other than to drugs and biological substances: Secondary | ICD-10-CM | POA: Diagnosis not present

## 2016-10-07 MED ORDER — LEVOCETIRIZINE DIHYDROCHLORIDE 5 MG PO TABS: 5.0000 mg | ORAL_TABLET | Freq: Every evening | ORAL | 2 refills | Status: DC

## 2016-10-07 MED ORDER — METHYLPREDNISOLONE 4 MG PO TBPK: ORAL_TABLET | ORAL | 0 refills | Status: DC

## 2016-10-07 NOTE — Progress Notes (Signed)
Chief Complaint  Patient presents with  . Hearing Problem  . Allergies    Subjective: Patient is a 61 y.o. female here for R sided hearing loss.  Over the past week, the patient has noticed fullness in her right ear as well as decreased hearing. She has a history of allergies for which she takes Environmental manager daily. She used to be on Zyrtec, however stop working well for her. She denies any fevers, recent illness, pain, drainage from the ear, or bleeding.   ROS: HEENT: as noted in HPI  Family History  Problem Relation Age of Onset  . Diabetes Paternal Grandfather   . Stroke Paternal Grandfather   . Hyperlipidemia Mother   . Hypertension Mother   . Hypertension Father   . Diabetes Father   . Prostate cancer Father   . Dementia Father   . Heart disease Maternal Grandmother   . Hypertension Maternal Grandmother   . Heart disease Maternal Grandfather   . Hypertension Maternal Grandfather   . Stroke Maternal Grandfather    Past Medical History:  Diagnosis Date  . Allergy   . Anemia    takes iron supplement  . Anxiety   . Arthritis   . 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 Elevated alkaline phosphatase level    negative workup (presumed secondary to fatty liver)  . Frequency-urgency syndrome   . GERD (gastroesophageal reflux disease)   . Interstitial cystitis    Dr Vernie Shanks  . Neuromuscular disorder (HCC)    rt arm carpal tunnel syndrome  . Obesity    status post bariatric procedure in [facility] 2005  . Osteoporosis   . Thyroid nodule 7/10   `  . Thyroid nodule   . Varicose vein of leg    Allergies  Allergen Reactions  . 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  No allergy.  . Nitrofurantoin     REACTION: fever  . Prednisone     REACTION: palpitations.  06/1715  Pt states she can now tolerate prednisone.    Current Outpatient Prescriptions:  .  acetaminophen (TYLENOL) 650 MG CR tablet, Take 2 tablets (1,300 mg total) by  mouth every 8 (eight) hours as needed for pain. Take for mild pain. Do not combine with Percocet. Do not exceed daily recommended limit of Tylenol., Disp: , Rfl:  .  ALPRAZolam (XANAX) 0.5 MG tablet, TAKE 1 TABLET BY MOUTH TWICE A DAY, Disp: 60 tablet, Rfl: 0 .  amitriptyline (ELAVIL) 50 MG tablet, TAKE 1 TABLET BY MOUTH EVERY EVENING, Disp: , Rfl: 3 .  azelastine (ASTELIN) 0.1 % nasal spray, Place 1 spray into both nostrils daily. Use in each nostril as directed, Disp: , Rfl:  .  Calcium Carbonate Antacid (TUMS ULTRA 1000 PO), Take 1,000 mg by mouth 2 (two) times daily., Disp: , Rfl:  .  cycloSPORINE (RESTASIS) 0.05 % ophthalmic emulsion, Place 1 drop into both eyes 2 (two) times daily., Disp: , Rfl:  .  ferrous sulfate 325 (65 FE) MG tablet, TAKE 1 TABLET BY MOUTH 2 TIMES A DAY, Disp: 30 tablet, Rfl: 2 .  MYRBETRIQ 50 MG TB24 tablet, Take 50 mg by mouth every evening. , Disp: , Rfl: 2 .  omeprazole (PRILOSEC) 40 MG capsule, TAKE 1 CAPSULE (40 MG TOTAL) BY MOUTH DAILY., Disp: 90 capsule, Rfl: 1 .  OVER THE COUNTER MEDICATION, Place 1 drop into both eyes 2 (two) times daily. Allergy eye drops, Disp: ,  Rfl:  .  oxyCODONE-acetaminophen (PERCOCET) 10-325 MG tablet, Take 1 tablet by mouth every 4 (four) hours as needed for pain (severe)., Disp: 40 tablet, Rfl: 0 .  PARoxetine (PAXIL) 30 MG tablet, TAKE 1 TABLET BY MOUTH EVERY MORNING, Disp: 90 tablet, Rfl: 1 .  valACYclovir (VALTREX) 500 MG tablet, TAKE 1 TABLET BY MOUTH EVERY DAY. NEEDS APPT, Disp: 30 tablet, Rfl: 0 .  Vitamin D, Ergocalciferol, (DRISDOL) 50000 units CAPS capsule, TAKE 1 CAPSULE (50,000 UNITS TOTAL) BY MOUTH 2 (TWO) TIMES A WEEK., Disp: 24 capsule, Rfl: 0 .  levocetirizine (XYZAL) 5 MG tablet, Take 1 tablet (5 mg total) by mouth every evening., Disp: 30 tablet, Rfl: 2 .  methylPREDNISolone (MEDROL DOSEPAK) 4 MG TBPK tablet, Follow instructions on package., Disp: 21 tablet, Rfl: 0  Objective: BP 122/80 (BP Location: Left Arm, Patient  Position: Sitting, Cuff Size: Large)   Pulse (!) 110   Temp 98.4 F (36.9 C) (Oral)   Ht 5' 4.5" (1.638 m)   Wt 244 lb 4 oz (110.8 kg)   SpO2 95%   BMI 41.28 kg/m  General: Awake, appears stated age HEENT: MMM, R canal patent, TM mildly bulging with fluid, L TM clear with some retraction, no fluid Heart: RRR, no murmurs Lungs: CTAB, no rales, wheezes or rhonchi. No accessory muscle use Psych: Age appropriate judgment and insight, normal affect and mood  Assessment and Plan: Fluid level behind tympanic membrane of right ear - Plan: methylPREDNISolone (MEDROL DOSEPAK) 4 MG TBPK tablet  Dysfunction of both eustachian tubes - Plan: methylPREDNISolone (MEDROL DOSEPAK) 4 MG TBPK tablet  Environmental allergies - Plan: levocetirizine (XYZAL) 5 MG tablet  Orders as above. We'll try to change Allegra to Xyzal. Switch back if too expensive. Seek care if fevers, pain, drainage, bleeding or other new symptom. Follow-up as needed. The patient voiced understanding and agreement to the plan.  [place], DO 10/07/16  11:27 AM

## 2016-10-07 NOTE — Patient Instructions (Addendum)
Continue Flonase. Will will try Xyzal instead of Allegra. If it is too expensive, go back to [place].   Follow up/seek care if you start having drainage from ears, pain, or fevers.   Let us know if you need anything.

## 2016-10-07 NOTE — Progress Notes (Signed)
Pre visit review using our clinic review tool, if applicable. No additional management support is needed unless otherwise documented below in the visit note. 

## 2016-10-13 ENCOUNTER — Telehealth: Payer: Self-pay | Admitting: Family Medicine

## 2016-10-13 NOTE — Telephone Encounter (Signed)
Caller name: Rubylee  Relation to pt: self  Call back number: [phone] Pharmacy:  Reason for call: Pt called stating still having issues with right inner ear, pt would like to make an appt with [facility] but that they mentioned will need a referral or some copies of her last visit to be sent to their office regarding about the inner ear issue (possible referral) or a call to their office to informed about her last visit tel [phone] or fax number [phone]. Please advise.

## 2016-10-14 ENCOUNTER — Other Ambulatory Visit: Payer: Self-pay | Admitting: Family

## 2016-10-14 DIAGNOSIS — H8391 Unspecified disease of right inner ear: Secondary | ICD-10-CM

## 2016-10-14 NOTE — Telephone Encounter (Signed)
OK to do. TY.  

## 2016-10-14 NOTE — Telephone Encounter (Signed)
Referral entered  

## 2016-10-18 NOTE — Telephone Encounter (Signed)
Notified pt. 

## 2016-11-03 ENCOUNTER — Encounter: Payer: Self-pay | Admitting: Gastroenterology

## 2016-11-03 ENCOUNTER — Other Ambulatory Visit: Payer: Self-pay | Admitting: Family

## 2016-11-03 DIAGNOSIS — Z1231 Encounter for screening mammogram for malignant neoplasm of breast: Secondary | ICD-10-CM

## 2016-11-08 ENCOUNTER — Telehealth: Payer: Self-pay | Admitting: Family

## 2016-11-09 NOTE — Telephone Encounter (Signed)
Melissa-- please advise below request?  Last alprazolam RX: 10/04/16, #60 Last OV: 03/21/16 Next OV: was due 09/18/16 but never scheduled. Seen 10/07/16 by Nani Ravens for acute issue. UDS: 06/16/16, low risk and due 12/17/16.

## 2016-11-10 NOTE — Telephone Encounter (Signed)
Refill sent but needs OV prior to additional refills.  

## 2016-11-10 NOTE — Telephone Encounter (Signed)
TB-Would you please call pt to sched appt? Plz advise/thx dmf

## 2016-11-10 NOTE — Telephone Encounter (Signed)
Patient states she will call back to schedule appointment.

## 2016-11-11 NOTE — Telephone Encounter (Signed)
Yes, ok to send refills if needed.

## 2016-11-11 NOTE — Telephone Encounter (Signed)
MO-Patient has made an appt for Dec for CPE/is asking if you will be willing to continue her current medication regimen until she is able to get in here for that appt? Plz advise/thx dmf

## 2016-11-11 NOTE — Telephone Encounter (Signed)
Relation to HK:FEXM Call back number:[phone] Pharmacy: [id number] [place], [place] [phone] (Phone) [phone] (Fax)     Reason for call:  Patient scheduled for Mon 01/16/2017 for physical with NP patient would like to know if NP will refill her out until appointment, please advise

## 2016-11-14 ENCOUNTER — Other Ambulatory Visit: Payer: Self-pay | Admitting: Family

## 2016-11-14 NOTE — Telephone Encounter (Signed)
Rx approved and sent to the pharmacy by e-script.//AB/CMA 

## 2016-11-14 NOTE — Telephone Encounter (Signed)
Reviewed refill history and it appears that meds being prescribed by PCP still have refill remaining or should get pt through to appt in December.

## 2016-11-20 ENCOUNTER — Other Ambulatory Visit: Payer: Self-pay | Admitting: Family

## 2016-11-21 NOTE — Telephone Encounter (Signed)
Requesting Vitamin D 50000 units-Take 1 capsule by mouth 2 times a week. Last refill:08/30/16 Last OV:10/07/16-Acute visit Please advise.//AB/CMA

## 2016-11-22 ENCOUNTER — Ambulatory Visit (HOSPITAL_BASED_OUTPATIENT_CLINIC_OR_DEPARTMENT_OTHER)
Admission: RE | Admit: 2016-11-22 | Discharge: 2016-11-22 | Disposition: A | Discharge status: Home / Self Care | Payer: BLUE CROSS/BLUE SHIELD | Source: Ambulatory Visit | Attending: Family | Admitting: Family

## 2016-11-22 DIAGNOSIS — Z1231 Encounter for screening mammogram for malignant neoplasm of breast: Secondary | ICD-10-CM | POA: Insufficient documentation

## 2016-12-15 ENCOUNTER — Telehealth: Payer: Self-pay | Admitting: Family

## 2016-12-15 MED ORDER — ALPRAZOLAM 0.5 MG PO TABS: 0.5000 mg | ORAL_TABLET | Freq: Two times a day (BID) | ORAL | 0 refills | Status: DC

## 2016-12-15 NOTE — Telephone Encounter (Signed)
Patient requesting refill on Xanax, has cpe scheduled for dec 2018.

## 2016-12-16 ENCOUNTER — Other Ambulatory Visit: Payer: Self-pay | Admitting: Family

## 2016-12-16 ENCOUNTER — Telehealth: Payer: Self-pay

## 2016-12-16 NOTE — Telephone Encounter (Signed)
Patient no showed for pre-visit today. Patient was called to reschedule. Patient states she will look at her work schedule and call back to reschedule today.   Riki Sheer, LPN

## 2016-12-16 NOTE — Telephone Encounter (Signed)
Chart review shows that rx was called to [place] yesterday. Spoke with pt. She states she [facility]. Cancelled rx at [facility] with Opal Sidles and called rx to pharmacy voicemail at [place]. Pt notified.

## 2016-12-21 ENCOUNTER — Ambulatory Visit (AMBULATORY_SURGERY_CENTER): Payer: Self-pay

## 2016-12-21 VITALS — Ht 64.5 in | Wt 247.8 lb

## 2016-12-21 DIAGNOSIS — Z1211 Encounter for screening for malignant neoplasm of colon: Secondary | ICD-10-CM

## 2016-12-21 MED ORDER — SUPREP BOWEL PREP KIT 17.5-3.13-1.6 GM/177ML PO SOLN: 1.0000 | Freq: Once | ORAL | 0 refills | Status: AC

## 2016-12-21 NOTE — Progress Notes (Signed)
No allergies to eggs or soy No diet meds No home oxygen No past problems with anesthesia  Declined emmi 

## 2016-12-30 ENCOUNTER — Encounter: Payer: Self-pay | Admitting: Gastroenterology

## 2016-12-30 ENCOUNTER — Ambulatory Visit (AMBULATORY_SURGERY_CENTER): Payer: BLUE CROSS/BLUE SHIELD | Admitting: Gastroenterology

## 2016-12-30 VITALS — BP 122/79 | HR 79 | Temp 97.1°F | Resp 13 | Ht 64.5 in | Wt 247.0 lb

## 2016-12-30 DIAGNOSIS — Z1212 Encounter for screening for malignant neoplasm of rectum: Secondary | ICD-10-CM

## 2016-12-30 DIAGNOSIS — K5730 Diverticulosis of large intestine without perforation or abscess without bleeding: Secondary | ICD-10-CM | POA: Diagnosis not present

## 2016-12-30 DIAGNOSIS — Z1211 Encounter for screening for malignant neoplasm of colon: Secondary | ICD-10-CM | POA: Diagnosis not present

## 2016-12-30 MED ORDER — SODIUM CHLORIDE 0.9 % IV SOLN: 500.0000 mL | INTRAVENOUS | Status: DC

## 2016-12-30 NOTE — Patient Instructions (Signed)
Impression/Recommendations:  Diverticulosis handout given to patient.  Resume previous diet. Continue present medications.  Repeat colonoscopy in 10 years for screening.  YOU HAD AN ENDOSCOPIC PROCEDURE TODAY AT [facility]:   Refer to the procedure report that was given to you for any specific questions about what was found during the examination.  If the procedure report does not answer your questions, please call your gastroenterologist to clarify.  If you requested that your care partner not be given the details of your procedure findings, then the procedure report has been included in a sealed envelope for you to review at your convenience later.  YOU SHOULD EXPECT: Some feelings of bloating in the abdomen. Passage of more gas than usual.  Walking can help get rid of the air that was put into your GI tract during the procedure and reduce the bloating. If you had a lower endoscopy (such as a colonoscopy or flexible sigmoidoscopy) you may notice spotting of blood in your stool or on the toilet paper. If you underwent a bowel prep for your procedure, you may not have a normal bowel movement for a few days.  Please Note:  You might notice some irritation and congestion in your nose or some drainage.  This is from the oxygen used during your procedure.  There is no need for concern and it should clear up in a day or so.  SYMPTOMS TO REPORT IMMEDIATELY:   Following lower endoscopy (colonoscopy or flexible sigmoidoscopy):  Excessive amounts of blood in the stool  Significant tenderness or worsening of abdominal pains  Swelling of the abdomen that is new, acute  Fever of 100F or higher For urgent or emergent issues, a gastroenterologist can be reached at any hour by calling [phone].   DIET:  We do recommend a small meal at first, but then you may proceed to your regular diet.  Drink plenty of fluids but you should avoid alcoholic beverages for 24 hours.  ACTIVITY:   You should plan to take it easy for the rest of today and you should NOT DRIVE or use heavy machinery until tomorrow (because of the sedation medicines used during the test).    FOLLOW UP: Our staff will call the number listed on your records the next business day following your procedure to check on you and address any questions or concerns that you may have regarding the information given to you following your procedure. If we do not reach you, we will leave a message.  However, if you are feeling well and you are not experiencing any problems, there is no need to return our call.  We will assume that you have returned to your regular daily activities without incident.  If any biopsies were taken you will be contacted by phone or by letter within the next 1-3 weeks.  Please call us at [phone] if you have not heard about the biopsies in 3 weeks.    SIGNATURES/CONFIDENTIALITY: You and/or your care partner have signed paperwork which will be entered into your electronic medical record.  These signatures attest to the fact that that the information above on your After Visit Summary has been reviewed and is understood.  Full responsibility of the confidentiality of this discharge information lies with you and/or your care-partner.

## 2016-12-30 NOTE — Progress Notes (Signed)
To PACU, VSS. Report to RN.tb 

## 2016-12-30 NOTE — Progress Notes (Signed)
Cuff moved to lower arm B/P improved. Cuff too small for upper arm.tb

## 2016-12-30 NOTE — Op Note (Signed)
[facility] Patient Name: [name] [name] Procedure Date: 12/30/2016 1:12 PM MRN: [id number] Endoscopist: Milus Banister , MD Age: 61 Referring MD:  Date of Birth: 09-Apr-1955 Gender: Female Account #: [id number] Procedure:                Colonoscopy Indications:              Screening for colorectal malignant neoplasm;                            colonoscopy 2008, no polyps Medicines:                Monitored Anesthesia Care Procedure:                Pre-Anesthesia Assessment:                           - Prior to the procedure, a History and Physical                            was performed, and patient medications and                            allergies were reviewed. The patient's tolerance of                            previous anesthesia was also reviewed. The risks                            and benefits of the procedure and the sedation                            options and risks were discussed with the patient.                            All questions were answered, and informed consent                            was obtained. Prior Anticoagulants: The patient has                            taken no previous anticoagulant or antiplatelet                            agents. ASA Grade Assessment: II - A patient with                            mild systemic disease. After reviewing the risks                            and benefits, the patient was deemed in                            satisfactory condition to undergo the procedure.  After obtaining informed consent, the colonoscope                            was passed under direct vision. Throughout the                            procedure, the patient's blood pressure, pulse, and                            oxygen saturations were monitored continuously. The                            Colonoscope was introduced through the anus and                            advanced to the the cecum,  identified by                            appendiceal orifice and ileocecal valve. The                            colonoscopy was performed without difficulty. The                            patient tolerated the procedure well. The quality                            of the bowel preparation was good. The ileocecal                            valve, appendiceal orifice, and rectum were                            photographed. Scope In: 1:16:41 PM Scope Out: 1:32:28 PM Scope Withdrawal Time: 0 hours 8 minutes 14 seconds  Total Procedure Duration: 0 hours 15 minutes 47 seconds  Findings:                 Multiple small and large-mouthed diverticula were                            found in the left colon.                           The exam was otherwise without abnormality on                            direct and retroflexion views. Complications:            No immediate complications. Estimated blood loss:                            None. Estimated Blood Loss:     Estimated blood loss: none. Impression:               - Diverticulosis in the left colon.                           -  The examination was otherwise normal on direct                            and retroflexion views.                           - No polyps or cancers. Recommendation:           - Patient has a contact number available for                            emergencies. The signs and symptoms of potential                            delayed complications were discussed with the                            patient. Return to normal activities tomorrow.                            Written discharge instructions were provided to the                            patient.                           - Resume previous diet.                           - Continue present medications.                           - Repeat colonoscopy in 10 years for screening. Milus Banister, MD 12/30/2016 1:35:24 PM This report has been signed  electronically.

## 2017-01-02 ENCOUNTER — Telehealth: Payer: Self-pay | Admitting: *Deleted

## 2017-01-02 ENCOUNTER — Telehealth: Payer: Self-pay

## 2017-01-02 NOTE — Telephone Encounter (Signed)
  Follow up Call-  Call [name] [name] number 12/30/2016  Post procedure Call [name] [name] phone  # [phone]  Permission to leave phone message Yes  Some recent data might be hidden     Patient questions:  Do you have a fever, pain , or abdominal swelling? No. Pain Score  0 *  Have you tolerated food without any problems? Yes.    Have you been able to return to your normal activities? Yes.    Do you have any questions about your discharge instructions: Diet   No. Medications  No. Follow up visit  No.  Do you have questions or concerns about your Care? No.  Actions: * If pain score is 4 or above: No action needed, pain <4.

## 2017-01-02 NOTE — Telephone Encounter (Signed)
Lm we will return call later today   Lenard Galloway RN

## 2017-01-16 ENCOUNTER — Ambulatory Visit (INDEPENDENT_AMBULATORY_CARE_PROVIDER_SITE_OTHER): Payer: BLUE CROSS/BLUE SHIELD | Admitting: Family

## 2017-01-16 ENCOUNTER — Other Ambulatory Visit (HOSPITAL_COMMUNITY)
Admission: RE | Admit: 2017-01-16 | Discharge: 2017-01-16 | Disposition: A | Discharge status: Home / Self Care | Payer: BLUE CROSS/BLUE SHIELD | Source: Ambulatory Visit | Attending: Family | Admitting: Family

## 2017-01-16 ENCOUNTER — Encounter: Payer: Self-pay | Admitting: Family

## 2017-01-16 VITALS — BP 130/92 | HR 77 | Temp 98.0°F | Resp 16 | Ht 64.0 in | Wt 243.0 lb

## 2017-01-16 DIAGNOSIS — Z9109 Other allergy status, other than to drugs and biological substances: Secondary | ICD-10-CM

## 2017-01-16 DIAGNOSIS — Z0001 Encounter for general adult medical examination with abnormal findings: Secondary | ICD-10-CM | POA: Diagnosis not present

## 2017-01-16 DIAGNOSIS — E559 Vitamin D deficiency, unspecified: Secondary | ICD-10-CM | POA: Diagnosis not present

## 2017-01-16 DIAGNOSIS — R87810 Cervical high risk human papillomavirus (HPV) DNA test positive: Secondary | ICD-10-CM | POA: Insufficient documentation

## 2017-01-16 DIAGNOSIS — R300 Dysuria: Secondary | ICD-10-CM

## 2017-01-16 DIAGNOSIS — Z01411 Encounter for gynecological examination (general) (routine) with abnormal findings: Secondary | ICD-10-CM | POA: Diagnosis present

## 2017-01-16 DIAGNOSIS — Z01419 Encounter for gynecological examination (general) (routine) without abnormal findings: Secondary | ICD-10-CM

## 2017-01-16 DIAGNOSIS — J329 Chronic sinusitis, unspecified: Secondary | ICD-10-CM

## 2017-01-16 DIAGNOSIS — Z0000 Encounter for general adult medical examination without abnormal findings: Secondary | ICD-10-CM

## 2017-01-16 DIAGNOSIS — F419 Anxiety disorder, unspecified: Secondary | ICD-10-CM | POA: Diagnosis not present

## 2017-01-16 DIAGNOSIS — R350 Frequency of micturition: Secondary | ICD-10-CM

## 2017-01-16 LAB — HEPATIC FUNCTION PANEL
ALBUMIN: 4.3 g/dL (ref 3.5–5.2)
ALT: 19 U/L (ref 0–35)
AST: 20 U/L (ref 0–37)
Alkaline Phosphatase: 87 U/L (ref 39–117)
Bilirubin, Direct: 0.1 mg/dL (ref 0.0–0.3)
TOTAL PROTEIN: 7.1 g/dL (ref 6.0–8.3)
Total Bilirubin: 0.5 mg/dL (ref 0.2–1.2)

## 2017-01-16 LAB — POC URINALSYSI DIPSTICK (AUTOMATED)
Bilirubin, UA: NEGATIVE
Blood, UA: NEGATIVE
Glucose, UA: NEGATIVE
Ketones, UA: NEGATIVE
LEUKOCYTES UA: NEGATIVE
NITRITE UA: NEGATIVE
PH UA: 6 (ref 5.0–8.0)
PROTEIN UA: NEGATIVE
Spec Grav, UA: 1.015 (ref 1.010–1.025)
Urobilinogen, UA: 0.2 E.U./dL

## 2017-01-16 LAB — CBC WITH DIFFERENTIAL/PLATELET
BASOS ABS: 0 10*3/uL (ref 0.0–0.1)
Basophils Relative: 1 % (ref 0.0–3.0)
Eosinophils Absolute: 0.1 10*3/uL (ref 0.0–0.7)
Eosinophils Relative: 2.8 % (ref 0.0–5.0)
HCT: 41.5 % (ref 36.0–46.0)
Hemoglobin: 13.4 g/dL (ref 12.0–15.0)
LYMPHS ABS: 1.4 10*3/uL (ref 0.7–4.0)
Lymphocytes Relative: 33.7 % (ref 12.0–46.0)
MCHC: 32.4 g/dL (ref 30.0–36.0)
MCV: 89.2 fl (ref 78.0–100.0)
MONO ABS: 0.3 10*3/uL (ref 0.1–1.0)
Monocytes Relative: 7.1 % (ref 3.0–12.0)
NEUTROS PCT: 55.4 % (ref 43.0–77.0)
Neutro Abs: 2.3 10*3/uL (ref 1.4–7.7)
PLATELETS: 262 10*3/uL (ref 150.0–400.0)
RBC: 4.65 Mil/uL (ref 3.87–5.11)
RDW: 13.9 % (ref 11.5–15.5)
WBC: 4.2 10*3/uL (ref 4.0–10.5)

## 2017-01-16 LAB — LIPID PANEL
CHOLESTEROL: 167 mg/dL (ref 0–200)
HDL: 52.3 mg/dL (ref >39.00)
LDL Cholesterol: 95 mg/dL (ref 0–99)
NONHDL: 114.33
Total CHOL/HDL Ratio: 3
Triglycerides: 96 mg/dL (ref 0.0–149.0)
VLDL: 19.2 mg/dL (ref 0.0–40.0)

## 2017-01-16 LAB — BASIC METABOLIC PANEL
BUN: 14 mg/dL (ref 6–23)
CALCIUM: 9.4 mg/dL (ref 8.4–10.5)
CO2: 28 mEq/L (ref 19–32)
Chloride: 103 mEq/L (ref 96–112)
Creatinine, Ser: 0.5 mg/dL (ref 0.40–1.20)
GFR: 133.44 mL/min (ref >60.00)
GLUCOSE: 105 mg/dL — AB (ref 70–99)
Potassium: 4.1 mEq/L (ref 3.5–5.1)
SODIUM: 141 meq/L (ref 135–145)

## 2017-01-16 LAB — VITAMIN D 25 HYDROXY (VIT D DEFICIENCY, FRACTURES): VITD: 53.26 ng/mL (ref 30.00–100.00)

## 2017-01-16 LAB — TSH: TSH: 0.41 u[IU]/mL (ref 0.35–4.50)

## 2017-01-16 MED ORDER — AMOXICILLIN-POT CLAVULANATE 875-125 MG PO TABS: 1.0000 | ORAL_TABLET | Freq: Two times a day (BID) | ORAL | 0 refills | Status: DC

## 2017-01-16 MED ORDER — LEVOCETIRIZINE DIHYDROCHLORIDE 5 MG PO TABS: 5.0000 mg | ORAL_TABLET | Freq: Every evening | ORAL | 1 refills | Status: DC

## 2017-01-16 NOTE — Patient Instructions (Signed)
Please complete lab work prior to leaving. Continue to work on healthy diet, regular exercise and weight loss.  

## 2017-01-16 NOTE — Progress Notes (Signed)
Subjective:    Patient ID: [name] [name], female    DOB: August 29, 1955, 61 y.o.   MRN: [id number]  HPI  [name] is a 61 yr old female who presents today for cpx.    Immunizations: Td 2014,   Diet: needs improvement Exercise:  Walks some, 15 minutes a day Colonoscopy:12/30/16 Dexa: 11/04/14- osteopenia Pap Smear:06/28/13 Mammogram: 11/22/16 Dental: up to date Vision: due  Sinus congestion- reports that her symptoms started in October with sneezing but has been gradually worsening. She reports some maxillary sinus pressure. Reports discolored copious sinus drainage and blood on the right side. Denies fever. Energy is ok.  She started xyzal, otc flonase with some improvement in her symptoms.    Wt Readings from Last 3 Encounters:  01/16/17 243 lb (110.2 kg)  12/30/16 247 lb (112 kg)  12/21/16 247 lb 12.8 oz (112.4 kg)     Review of Systems  Constitutional: Negative for unexpected weight change.  HENT: Positive for rhinorrhea.   Respiratory: Negative for cough and shortness of breath.   Cardiovascular: Negative for chest pain.  Gastrointestinal: Negative for diarrhea, nausea and vomiting.  Genitourinary:       Reports dysuria  Musculoskeletal: Negative for myalgias.       Some arthritis (foot)  Neurological: Negative for headaches.  Hematological: Negative for adenopathy.  Psychiatric/Behavioral:       Denies depression, + anxiety- especially at night.    Past Medical History:  Diagnosis Date  . Allergy   . Anemia    takes iron supplement  . Anxiety   . Arthritis   . 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 Elevated alkaline phosphatase level    negative workup (presumed secondary to fatty liver)  . Frequency-urgency syndrome   . GERD (gastroesophageal reflux disease)   . Interstitial cystitis    Dr Vernie Shanks  . Neuromuscular disorder (HCC)    rt arm carpal tunnel syndrome  . Obesity    status post bariatric procedure in [facility] 2005  . Osteoporosis   . Thyroid  nodule 7/10   `  . Thyroid nodule   . Varicose vein of leg      Social History   Socioeconomic History  . Marital status: Divorced    Spouse name: Not on file  . Number of children: 1  . Years of education: Not on file  . Highest education level: Not on file  Social Needs  . Financial resource strain: Not on file  . Food insecurity - worry: Not on file  . Food insecurity - inability: Not on file  . Transportation needs - medical: Not on file  . Transportation needs - non-medical: Not on file  Occupational History  . 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[employer]  Tobacco Use  . Smoking status: Never Smoker  . Smokeless tobacco: Never Used  Substance and Sexual Activity  . Alcohol use: Yes    Comment: RARE  . Drug use: No  . Sexual activity: Yes  Other Topics Concern  . Not on file  Social History Narrative  . Not on file    Past Surgical History:  Procedure Laterality Date  . ANTERIOR CERVICAL DECOMP/DISCECTOMY FUSION N/A 06/21/2012   Procedure: ANTERIOR CERVICAL DECOMPRESSION/DISCECTOMY FUSION C-4 - C5 (SPACER/DePUY CERVICAL PLATE ONLY) 1 LEVEL;  Surgeon: Melina Schools, MD;  Location: [place];  Service: Orthopedics;  Laterality: N/A;  . BLADDER SURGERY  02/21/12 and 02/28/12   Dr Diona Fanti  . COLPOSCOPY  [place]  . CYSTO WITH HYDRODISTENSION  12/12/2011   Procedure: CYSTOSCOPY/HYDRODISTENSION;  Surgeon: Franchot Gallo, MD;  Location: [facility];  Service: Urology;  Laterality: N/A;  30 MIN   . EYE SURGERY  1997   bilateral cataract extraction  . FOOT SURGERY     bilateral bunionettes  . FOOT SURGERY Right 01/07/14   Pt states surgery was due to arthritis. "Has pins in place now"  . GASTRIC BYPASS  2005  . JOINT REPLACEMENT  2008   left total knee  . KNEE SURGERY  1997   left knee  . NASAL SINUS SURGERY  7619   Dr Erik Obey  . REFRACTIVE SURGERY  2006   lasik  . SHOULDER SURGERY  2 2012   RIGHT   . STOMACH SURGERY   2007   "tummy tuck"  . TOTAL KNEE ARTHROPLASTY Right 12/31/2015   Procedure: RIGHT TOTAL KNEE ARTHROPLASTY;  Surgeon: [name], MD;  Location: [facility];  Service: Orthopedics;  Laterality: Right;  Adductor Block  . VARICOSE VEIN SURGERY Right 02/2012   leg    Family History  Problem Relation Age of Onset  . Diabetes Paternal Grandfather   . Stroke Paternal Grandfather   . Hyperlipidemia Mother   . Hypertension Mother   . Hypertension Father   . Diabetes Father   . Prostate cancer Father   . Dementia Father   . Heart disease Maternal Grandmother   . Hypertension Maternal Grandmother   . Heart disease Maternal Grandfather   . Hypertension Maternal Grandfather   . Stroke Maternal Grandfather   . Colon cancer Neg Hx   . Esophageal cancer Neg Hx   . Pancreatic cancer Neg Hx   . Stomach cancer Neg Hx     Allergies  Allergen Reactions  . 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  No allergy.  . Nitrofurantoin     REACTION: fever  . Prednisone     REACTION: palpitations.  06/1715  Pt states she can now tolerate prednisone.    Current Outpatient Medications on File Prior to Visit  Medication Sig Dispense Refill  . acetaminophen (TYLENOL) 650 MG CR tablet Take 2 tablets (1,300 mg total) by mouth every 8 (eight) hours as needed for pain. Take for mild pain. Do not combine with Percocet. Do not exceed daily recommended limit of Tylenol.    . ALPRAZolam (XANAX) 0.5 MG tablet TAKE 1 TABLET BY MOUTH TWICE A DAY 60 tablet 0  . amitriptyline (ELAVIL) 50 MG tablet TAKE 1 TABLET BY MOUTH EVERY EVENING  3  . azelastine (ASTELIN) 0.1 % nasal spray Place 1 spray into both nostrils daily. Use in each nostril as directed    . BIOTIN PO Take by mouth.    . Calcium Carbonate Antacid (TUMS ULTRA 1000 PO) Take 1,000 mg by mouth 2 (two) times daily.    . cycloSPORINE (RESTASIS) 0.05 % ophthalmic emulsion Place 1 drop into both eyes 2 (two) times daily.    .  ferrous sulfate 325 (65 FE) MG tablet TAKE 1 TABLET BY MOUTH 2 TIMES A DAY 30 tablet 2  . levocetirizine (XYZAL) 5 MG tablet Take 1 tablet (5 mg total) by mouth every evening. 30 tablet 2  . Multiple Vitamin (MULTIVITAMIN) tablet Take 1 tablet 2 (two) times daily by mouth.    . multivitamin-lutein (OCUVITE-LUTEIN) CAPS capsule Take 1 capsule daily by mouth.    Marland Kitchen MYRBETRIQ 50  MG TB24 tablet Take 50 mg by mouth every evening.   2  . Omega-3 Fatty Acids (FISH OIL PO) Take 2 (two) times daily by mouth.    Marland Kitchen omeprazole (PRILOSEC) 40 MG capsule TAKE 1 CAPSULE (40 MG TOTAL) BY MOUTH DAILY. 90 capsule 1  . OVER THE COUNTER MEDICATION Place 1 drop into both eyes 2 (two) times daily. Allergy eye drops    . PARoxetine (PAXIL) 30 MG tablet TAKE 1 TABLET BY MOUTH EVERY MORNING 90 tablet 1  . valACYclovir (VALTREX) 500 MG tablet TAKE 1 TABLET BY MOUTH EVERY DAY. NEEDS APPT (Patient taking differently: prn) 30 tablet 0  . VITAMIN A PO Take by mouth.    . Vitamin D, Ergocalciferol, (DRISDOL) 50000 units CAPS capsule TAKE 1 CAPSULE (50,000 UNITS TOTAL) BY MOUTH 2 (TWO) TIMES A WEEK. 24 capsule 1   No current facility-administered medications on file prior to visit.     BP (!) 147/82 (BP Location: Right Arm, Patient Position: Sitting, Cuff Size: Large)   Pulse 77   Temp 98 F (36.7 C) (Oral)   Resp 16   Ht 5\' 4"  (1.626 m)   Wt 243 lb (110.2 kg)   SpO2 100%   BMI 41.71 kg/m       Objective:   Physical Exam  Physical Exam  Constitutional: She is oriented to person, place, and time. She appears well-developed and well-nourished. No distress.  HENT:  Head: Normocephalic and atraumatic.  Right Ear: Tympanic membrane and ear canal normal.  Left Ear: Tympanic membrane and ear canal normal.  Mouth/Throat: Oropharynx is clear and moist.  Eyes: Pupils are equal, round, and reactive to light. No scleral icterus.  Neck: Normal range of motion. No thyromegaly present.  Cardiovascular: Normal rate and  regular rhythm.   No murmur heard. Pulmonary/Chest: Effort normal and breath sounds normal. No respiratory distress. He has no wheezes. She has no rales. She exhibits no tenderness.  Abdominal: Soft. Bowel sounds are normal. She exhibits no distension and no mass. There is no tenderness. There is no rebound and no guarding.  Musculoskeletal: She exhibits no edema.  Lymphadenopathy:    She has no cervical adenopathy.  Neurological: She is alert and oriented to person, place, and time. She exhibits normal muscle tone. Coordination normal.  Skin: Skin is warm and dry.  Psychiatric: She has a normal mood and affect. Her behavior is normal. Judgment and thought content normal.  Breasts: Examined lying Right: Without masses, retractions, discharge or axillary adenopathy.  Left: Without masses, retractions, discharge or axillary adenopathy.  Inguinal/mons: Normal without inguinal adenopathy  External genitalia: Normal  BUS/Urethra/Skene's glands: Normal  Bladder: Normal  Vagina: Normal  Cervix: Normal  Uterus: normal in size, shape and contour. Midline and mobile  Adnexa/parametria:  Rt: Without masses or tenderness.  Lt: Without masses or tenderness.  Anus and perineum: Normal            Assessment & Plan:   Preventative Care- discussed healthy diet, exercise, weight loss. EKG tracing is personally reviewed.  EKG notes NSR with RBBB (this is unchanged from previous EKG).  No acute changes. Pap performed today. Obtain routine lab work. Immunizations reviewed and up to date.  She will return for second shingrix at the pharmacy.   Sinusitis- rx with augmentin. Continue xyzal and flonase.  Dysuria- obtain UA/Culture.  Anxiety- continues xanax prn, obtain UDS.       Assessment & Plan:

## 2017-01-17 ENCOUNTER — Encounter: Payer: Self-pay | Admitting: Family

## 2017-01-17 LAB — URINE CULTURE
MICRO NUMBER:: 81354920
SPECIMEN QUALITY: ADEQUATE

## 2017-01-18 LAB — CYTOLOGY - PAP
Bacterial vaginitis: NEGATIVE
Candida vaginitis: NEGATIVE
DIAGNOSIS: NEGATIVE
HPV: DETECTED — AB

## 2017-01-19 LAB — PAIN MGMT, PROFILE 8 W/CONF, U
6 ACETYLMORPHINE: NEGATIVE ng/mL (ref <10)
ALPHAHYDROXYMIDAZOLAM: NEGATIVE ng/mL (ref <50)
ALPHAHYDROXYTRIAZOLAM: NEGATIVE ng/mL (ref <50)
AMPHETAMINES: NEGATIVE ng/mL (ref <500)
Alcohol Metabolites: NEGATIVE ng/mL (ref <500)
Alphahydroxyalprazolam: 74 ng/mL — ABNORMAL HIGH (ref <25)
Aminoclonazepam: NEGATIVE ng/mL (ref <25)
BENZODIAZEPINES: POSITIVE ng/mL — AB (ref <100)
Buprenorphine, Urine: NEGATIVE ng/mL (ref <5)
Cocaine Metabolite: NEGATIVE ng/mL (ref <150)
Creatinine: 32.9 mg/dL
HYDROXYETHYLFLURAZEPAM: NEGATIVE ng/mL (ref <50)
Lorazepam: NEGATIVE ng/mL (ref <50)
MARIJUANA METABOLITE: NEGATIVE ng/mL (ref <20)
MDMA: NEGATIVE ng/mL (ref <500)
NORDIAZEPAM: NEGATIVE ng/mL (ref <50)
OPIATES: NEGATIVE ng/mL (ref <100)
OXAZEPAM: NEGATIVE ng/mL (ref <50)
Oxidant: NEGATIVE ug/mL (ref <200)
Oxycodone: NEGATIVE ng/mL (ref <100)
Temazepam: NEGATIVE ng/mL (ref <50)
pH: 7.05 (ref 4.5–9.0)

## 2017-01-20 ENCOUNTER — Other Ambulatory Visit: Payer: Self-pay | Admitting: Family

## 2017-01-20 ENCOUNTER — Encounter: Payer: Self-pay | Admitting: Family

## 2017-01-20 MED ORDER — ALPRAZOLAM 0.5 MG PO TABS: 0.5000 mg | ORAL_TABLET | Freq: Two times a day (BID) | ORAL | 0 refills | Status: DC

## 2017-01-20 NOTE — Telephone Encounter (Signed)
Pt is requesting refill on alprazolam.   Last OV: 01/16/2017 Last Fill: 12/16/2016 #60 and 0RF UDS: 06/16/2016 Low risk   Please advise.

## 2017-01-20 NOTE — Telephone Encounter (Signed)
Last alprazolam RX: 01/20/17, #60 Last OV: 01/16/17 Next OV: none scheduled UDS: 01/16/17 CSC: 06/16/16 CS Registry: Reviewed / no discrepancies.  Please advise?

## 2017-01-25 ENCOUNTER — Telehealth: Payer: Self-pay | Admitting: Family

## 2017-01-25 DIAGNOSIS — B977 Papillomavirus as the cause of diseases classified elsewhere: Secondary | ICD-10-CM

## 2017-01-25 NOTE — Telephone Encounter (Signed)
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  I would like her to meet with GYN for further evaluation.

## 2017-01-25 NOTE — Telephone Encounter (Signed)
PER CYTOLOGY, RESULTS READY WITH H.R. HPV

## 2017-01-25 NOTE — Telephone Encounter (Signed)
Please contact [facility] and ask if they can add on testing for high risk HPV for her pap.

## 2017-01-26 NOTE — Telephone Encounter (Signed)
Notified pt and she voices understanding. Pt requests referral to go to [facility] as she is already established with them. Referral and result faxed to [phone].

## 2017-01-26 NOTE — Telephone Encounter (Signed)
Reviewed

## 2017-02-14 HISTORY — PX: FRACTURE SURGERY: SHX138

## 2017-02-15 ENCOUNTER — Other Ambulatory Visit: Payer: Self-pay | Admitting: Family

## 2017-02-22 ENCOUNTER — Other Ambulatory Visit: Payer: Self-pay | Admitting: Family

## 2017-02-22 NOTE — Telephone Encounter (Addendum)
Last alprazolam RX: 01/20/17, #60 Last OV:  01/16/17 Next OV: not scheduled UDS: 01/20/17 CSC: 06/16/16 CSR: No discrepancies identified  Rx called to [facility], #60 x no refills.

## 2017-02-23 ENCOUNTER — Other Ambulatory Visit: Payer: Self-pay | Admitting: Family

## 2017-02-23 ENCOUNTER — Encounter: Payer: Self-pay | Admitting: Family Medicine

## 2017-02-23 ENCOUNTER — Ambulatory Visit: Payer: BLUE CROSS/BLUE SHIELD | Admitting: Family Medicine

## 2017-02-23 ENCOUNTER — Encounter: Payer: Self-pay | Admitting: Family

## 2017-02-23 VITALS — BP 120/82 | HR 84 | Temp 98.8°F | Ht 64.5 in | Wt 244.4 lb

## 2017-02-23 DIAGNOSIS — J0100 Acute maxillary sinusitis, unspecified: Secondary | ICD-10-CM | POA: Diagnosis not present

## 2017-02-23 DIAGNOSIS — H6591 Unspecified nonsuppurative otitis media, right ear: Secondary | ICD-10-CM

## 2017-02-23 MED ORDER — AMOXICILLIN-POT CLAVULANATE 875-125 MG PO TABS: 1.0000 | ORAL_TABLET | Freq: Two times a day (BID) | ORAL | 0 refills | Status: DC

## 2017-02-23 MED ORDER — METHYLPREDNISOLONE 4 MG PO TBPK: ORAL_TABLET | ORAL | 0 refills | Status: DC

## 2017-02-23 NOTE — Patient Instructions (Addendum)
Most sinus infections are viral in etiology and antibiotics will not be helpful. That being said, if you start having worsening symptoms over 3 days, you are worsening by day 10 or not improving by day 14, go ahead and take it. You are on Day 3 as of now.   Continue to push fluids, practice good hand hygiene, and cover your mouth if you cough.  If you start having fevers, shaking or shortness of breath, seek immediate care.  If you have pus draining from your nose, fevers, or worsening pain, OK to take antibiotic immediately.   Let us know if you need anything.

## 2017-02-23 NOTE — Progress Notes (Signed)
Pre visit review using our clinic review tool, if applicable. No additional management support is needed unless otherwise documented below in the visit note. 

## 2017-02-23 NOTE — Progress Notes (Signed)
Chief Complaint  Patient presents with  . Sinusitis  . Cough    [name] [name] here for URI complaints.  Duration: 1 week  Associated symptoms: sinus congestion, sinus pain, rhinorrhea and cough Denies: ear pain, ear drainage, sore throat, wheezing, shortness of breath, myalgia and fevers/rigors Treatment to date:Sudafed, Tylenol, INCS, Xyzal Sick contacts: Yes   ROS:  Const: Denies fevers HEENT: As noted in HPI Lungs: No SOB  Past Medical History:  Diagnosis Date  . Allergy   . Anemia    takes iron supplement  . Anxiety   . Arthritis   . 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 Elevated alkaline phosphatase level    negative workup (presumed secondary to fatty liver)  . Frequency-urgency syndrome   . GERD (gastroesophageal reflux disease)   . Interstitial cystitis    Dr Vernie Shanks  . Neuromuscular disorder (HCC)    rt arm carpal tunnel syndrome  . Obesity    status post bariatric procedure in [facility] 2005  . Osteoporosis   . Thyroid nodule 7/10   `  . Thyroid nodule   . Varicose vein of leg    Family History  Problem Relation Age of Onset  . Diabetes Paternal Grandfather   . Stroke Paternal Grandfather   . Hyperlipidemia Mother   . Hypertension Mother   . Hypertension Father   . Diabetes Father   . Prostate cancer Father   . Dementia Father   . Heart disease Maternal Grandmother   . Hypertension Maternal Grandmother   . Heart disease Maternal Grandfather   . Hypertension Maternal Grandfather   . Stroke Maternal Grandfather   . Colon cancer Neg Hx   . Esophageal cancer Neg Hx   . Pancreatic cancer Neg Hx   . Stomach cancer Neg Hx     BP 120/82 (BP Location: Left Arm, Patient Position: Sitting, Cuff Size: Large)   Pulse 84   Temp 98.8 F (37.1 C) (Oral)   Ht 5' 4.5" (1.638 m)   Wt 244 lb 6 oz (110.8 kg)   SpO2 95%   BMI 41.30 kg/m  General: Awake, alert, appears stated age HEENT: AT, [place], ears patent b/l and TM neg on L, +serous effusion on R, +TTP  over R max sinus, nares patent w/o discharge, pharynx pink and without exudates, MMM Neck: No masses or asymmetry Heart: RRR Lungs: CTAB, no accessory muscle use Psych: Age appropriate judgment and insight, normal mood and affect  Acute maxillary sinusitis, recurrence not specified - Plan: amoxicillin-clavulanate (AUGMENTIN) 875-125 MG tablet  Fluid level behind tympanic membrane of right ear - Plan: methylPREDNISolone (MEDROL DOSEPAK) 4 MG TBPK tablet  Orders as above. Pocket rx instructions given. Continue to push fluids, practice good hand hygiene, cover mouth when coughing. F/u prn. If starting to experience fevers, shaking, or shortness of breath, seek immediate care. Pt voiced understanding and agreement to the plan.  [place], DO 02/23/17 4:02 PM

## 2017-02-24 MED ORDER — ALPRAZOLAM 0.5 MG PO TABS: 0.5000 mg | ORAL_TABLET | Freq: Two times a day (BID) | ORAL | 0 refills | Status: DC

## 2017-02-24 NOTE — Telephone Encounter (Signed)
Denied d/t multiple requests- we received original request earlier this week and has been sent to [facility].

## 2017-02-24 NOTE — Telephone Encounter (Signed)
Patient checking status, stating it was denied. Please advise

## 2017-03-01 ENCOUNTER — Ambulatory Visit: Payer: BLUE CROSS/BLUE SHIELD | Admitting: Gynecology

## 2017-03-01 ENCOUNTER — Encounter: Payer: Self-pay | Admitting: Gynecology

## 2017-03-01 VITALS — BP 122/80 | Ht 64.5 in | Wt 242.0 lb

## 2017-03-01 DIAGNOSIS — B001 Herpesviral vesicular dermatitis: Secondary | ICD-10-CM

## 2017-03-01 DIAGNOSIS — N760 Acute vaginitis: Secondary | ICD-10-CM

## 2017-03-01 DIAGNOSIS — R87810 Cervical high risk human papillomavirus (HPV) DNA test positive: Secondary | ICD-10-CM

## 2017-03-01 MED ORDER — VALACYCLOVIR HCL 1 G PO TABS: 2000.0000 mg | ORAL_TABLET | Freq: Two times a day (BID) | ORAL | 3 refills | Status: DC

## 2017-03-01 MED ORDER — FLUCONAZOLE 150 MG PO TABS: 150.0000 mg | ORAL_TABLET | Freq: Once | ORAL | 0 refills | Status: AC

## 2017-03-01 NOTE — Addendum Note (Signed)
Addended by: Anastasio Auerbach on: 03/01/2017 03:18 PM   Modules accepted: Orders

## 2017-03-01 NOTE — Patient Instructions (Signed)
Office will call you with biopsy results.  Take the Diflucan pill now and repeat it when you are done taking the other antibiotics.  Take 2 of the Valtrex pills at the earliest onset of the cold sore and repeat that dose 12 hours later for a total of 4 pills.

## 2017-03-01 NOTE — Progress Notes (Signed)
    CARRIGAN DELAFUENTE Nov 17, 1955 [id number]        62 y.o.  G1P1 former patient of Dr. Cherylann Banas who has not been seen for a number of years presents in referral from her primary physician who is doing her routine GYN follow-up care where recent Pap smear was performed which had normal cytology but positive high risk HPV.  Subtyping was not done.  She does have a history of high-grade dysplasia 1980 and had cryosurgery with normal Pap smears afterwards.  Patient also is complaining of vaginal itching and irritation.  She notes that she is prone to yeast infections and currently is on Augmentin for sinus infection.  Slowly she asked about a prescription for Valtrex for her cold sores.  Has used this in the past with good results shortening outbreaks.  Past medical history,surgical history, problem list, medications, allergies, family history and social history were all reviewed and documented in the EPIC chart.  Directed ROS with pertinent positives and negatives documented in the history of present illness/assessment and plan.  Exam: Caryn Bee assistant Vitals:   03/01/17 1411  BP: 122/80  Weight: 242 lb (109.8 kg)  Height: 5' 4.5" (1.638 m)   General appearance:  Normal Abdomen soft nontender without masses guarding rebound Pelvic external BUS vagina with atrophic changes.  Cervix with atrophic changes.  Uterus grossly normal midline mobile nontender.  Adnexa without masses or tenderness  Colposcopy performed after acetic acid cleanse was adequate with transformation zone visualized 360 degrees.  No abnormalities seen.  ECC was performed.  Patient tolerated well. Physical Exam  Genitourinary:       Assessment/Plan:  62 y.o. G1P1 with  1. Recent Pap smear was normal cytology positive high risk HPV.  I reviewed with the patient the options to include repeating the Pap smear in 1 year with HPV.  If persistent positive HPV or any cytologic abnormalities then colposcopy versus  proceeding with colposcopy now.  With patient prefers to proceed with colposcopy now which was performed as above and normal.  She will follow-up for the ECC results.  Assuming negative then plan follow-up Pap smear 1 year with HPV. 2. Vaginal itching and irritation.  History of frequent yeast infections before.  Currently on Augmentin.  Will treat with Diflucan 150 mg now with repeat at the end of her Augmentin course.  We will follow-up if her symptoms persist, worsen or recur. 3. History of herpes labialis requesting Valtrex.  Will treat with Valtrex 2 g p.o. at earliest onset of symptoms with repeat x1 in 12 hours.  #12 with 2 refills provided.   Anastasio Auerbach MD, 2:41 PM 03/01/2017

## 2017-03-07 LAB — PATHOLOGY

## 2017-03-07 LAB — TISSUE SPECIMEN

## 2017-03-28 ENCOUNTER — Encounter: Payer: Self-pay | Admitting: Family

## 2017-03-28 MED ORDER — ALPRAZOLAM 0.5 MG PO TABS: 0.5000 mg | ORAL_TABLET | Freq: Two times a day (BID) | ORAL | 0 refills | Status: DC

## 2017-03-28 NOTE — Telephone Encounter (Signed)
Requesting: alprazolam Contract: 06/01/16 UDS: 06/16/16 Last Visit:02/23/17 Next Visit: none Last Refill: 02/24/17  Please Advise

## 2017-04-06 ENCOUNTER — Ambulatory Visit: Payer: BLUE CROSS/BLUE SHIELD | Admitting: Family

## 2017-04-06 ENCOUNTER — Encounter: Payer: Self-pay | Admitting: Family

## 2017-04-06 VITALS — BP 133/79 | HR 97 | Temp 98.5°F | Resp 16 | Ht 64.0 in | Wt 240.6 lb

## 2017-04-06 DIAGNOSIS — J101 Influenza due to other identified influenza virus with other respiratory manifestations: Secondary | ICD-10-CM | POA: Diagnosis not present

## 2017-04-06 DIAGNOSIS — R52 Pain, unspecified: Secondary | ICD-10-CM

## 2017-04-06 DIAGNOSIS — R509 Fever, unspecified: Secondary | ICD-10-CM

## 2017-04-06 LAB — POC INFLUENZA A&B (BINAX/QUICKVUE)
INFLUENZA B, POC: NEGATIVE
Influenza A, POC: POSITIVE — AB

## 2017-04-06 NOTE — Patient Instructions (Signed)
Your results are positive for strep. You may use tylenol as needed for pain.  Be sure to drink plenty of fluids. Call if new/worsening symptoms or if you are not improved in 3-4 days.   Influenza, Adult Influenza ("the flu") is an infection in the lungs, nose, and throat (respiratory tract). It is caused by a virus. The flu causes many common cold symptoms, as well as a high fever and body aches. It can make you feel very sick. The flu spreads easily from person to person (is contagious). Getting a flu shot (influenza vaccination) every year is the best way to prevent the flu. Follow these instructions at home:  Take over-the-counter and prescription medicines only as told by your doctor.  Use a cool mist humidifier to add moisture (humidity) to the air in your home. This can make it easier to breathe.  Rest as needed.  Drink enough fluid to keep your pee (urine) clear or pale yellow.  Cover your mouth and nose when you cough or sneeze.  Wash your hands with soap and water often, especially after you cough or sneeze. If you cannot use soap and water, use hand sanitizer.  Stay home from work or school as told by your doctor. Unless you are visiting your doctor, try to avoid leaving home until your fever has been gone for 24 hours without the use of medicine.  Keep all follow-up visits as told by your doctor. This is important. How is this prevented?  Getting a yearly (annual) flu shot is the best way to avoid getting the flu. You may get the flu shot in late summer, fall, or winter. Ask your doctor when you should get your flu shot.  Wash your hands often or use hand sanitizer often.  Avoid contact with people who are sick during cold and flu season.  Eat healthy foods.  Drink plenty of fluids.  Get enough sleep.  Exercise regularly. Contact a doctor if:  You get new symptoms.  You have: ? Chest pain. ? Watery poop (diarrhea). ? A fever.  Your cough gets worse.  You  start to have more mucus.  You feel sick to your stomach (nauseous).  You throw up (vomit). Get help right away if:  You start to be short of breath or have trouble breathing.  Your skin or nails turn a bluish color.  You have very bad pain or stiffness in your neck.  You get a sudden headache.  You get sudden pain in your face or ear.  You cannot stop throwing up. This information is not intended to replace advice given to you by your health care provider. Make sure you discuss any questions you have with your health care provider. Document Released: 11/10/2007 Document Revised: 07/09/2015 Document Reviewed: 11/25/2014 Elsevier Interactive Patient Education  2017 [employer].

## 2017-04-06 NOTE — Progress Notes (Signed)
Subjective:    Patient ID: [name] [name], female    DOB: [birth year], 62 y.o.   MRN: [id number]  HPI  Patient is a 62 yr old female who presents today with c/o cough, nasal congestion/ear pain, myalgias, sore throat. Symptoms have been present x 4 days. Husband sick with same symptoms.    Review of Systems See HPI  Past Medical History:  Diagnosis Date  . Allergy   . Anemia    takes iron supplement  . Anxiety   . Arthritis   . 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 Elevated alkaline phosphatase level    negative workup (presumed secondary to fatty liver)  . Frequency-urgency syndrome   . GERD (gastroesophageal reflux disease)   . Interstitial cystitis    Dr Vernie Shanks  . Neuromuscular disorder (HCC)    rt arm carpal tunnel syndrome  . Obesity    status post bariatric procedure in [facility] 2005  . Osteoporosis   . Thyroid nodule 7/10   `  . Thyroid nodule   . Varicose vein of leg      Social History   Socioeconomic History  . Marital status: Divorced    Spouse name: Not on file  . Number of children: 1  . Years of education: Not on file  . Highest education level: Not on file  Social Needs  . Financial resource strain: Not on file  . Food insecurity - worry: Not on file  . Food insecurity - inability: Not on file  . Transportation needs - medical: Not on file  . Transportation needs - non-medical: Not on file  Occupational History  . 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[employer]  Tobacco Use  . Smoking status: Never Smoker  . Smokeless tobacco: Never Used  Substance and Sexual Activity  . Alcohol use: Yes    Comment: RARE  . Drug use: No  . Sexual activity: Yes    Comment: 1st intercourse 62 yo-More than 5 partners  Other Topics Concern  . Not on file  Social History Narrative  . Not on file    Past Surgical History:  Procedure Laterality Date  . ANTERIOR CERVICAL DECOMP/DISCECTOMY FUSION N/A 06/21/2012   Procedure: ANTERIOR CERVICAL  DECOMPRESSION/DISCECTOMY FUSION C-4 - C5 (SPACER/DePUY CERVICAL PLATE ONLY) 1 LEVEL;  Surgeon: Melina Schools, MD;  Location: [place];  Service: Orthopedics;  Laterality: N/A;  . BLADDER SURGERY  02/21/12 and 02/28/12   Dr Diona Fanti  . COLPOSCOPY  1980  . CRYOTHERAPY  1980  . CYSTO WITH HYDRODISTENSION  12/12/2011   Procedure: CYSTOSCOPY/HYDRODISTENSION;  Surgeon: Franchot Gallo, MD;  Location: [facility];  Service: Urology;  Laterality: N/A;  30 MIN   . EYE SURGERY  1997   bilateral cataract extraction  . FOOT SURGERY     bilateral bunionettes  . FOOT SURGERY Right 01/07/14   Pt states surgery was due to arthritis. "Has pins in place now"  . GASTRIC BYPASS  2005  . JOINT REPLACEMENT  2008   left total knee  . KNEE SURGERY  1997   left knee  . NASAL SINUS SURGERY  3016   Dr Erik Obey  . REFRACTIVE SURGERY  2006   lasik  . SHOULDER SURGERY  2 2012   RIGHT   . STOMACH SURGERY  2007   "tummy tuck"  . TOTAL KNEE ARTHROPLASTY Right 12/31/2015   Procedure: RIGHT TOTAL KNEE ARTHROPLASTY;  Surgeon: [name], MD;  Location: [facility];  Service: Orthopedics;  Laterality: Right;  Adductor Block  . VARICOSE VEIN SURGERY Right 02/2012   leg    Family History  Problem Relation Age of Onset  . Diabetes Paternal Grandfather   . Stroke Paternal Grandfather   . Hyperlipidemia Mother   . Hypertension Mother   . Hypertension Father   . Diabetes Father   . Prostate cancer Father   . Dementia Father   . Heart disease Maternal Grandmother   . Hypertension Maternal Grandmother   . Heart disease Maternal Grandfather   . Hypertension Maternal Grandfather   . Stroke Maternal Grandfather   . Colon cancer Neg Hx   . Esophageal cancer Neg Hx   . Pancreatic cancer Neg Hx   . Stomach cancer Neg Hx     Allergies  Allergen Reactions  . 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  No allergy.  . Nitrofurantoin     REACTION: fever    Current  Outpatient Medications on File Prior to Visit  Medication Sig Dispense Refill  . acetaminophen (TYLENOL) 650 MG CR tablet Take 2 tablets (1,300 mg total) by mouth every 8 (eight) hours as needed for pain. Take for mild pain. Do not combine with Percocet. Do not exceed daily recommended limit of Tylenol.    . ALPRAZolam (XANAX) 0.5 MG tablet Take 1 tablet (0.5 mg total) by mouth 2 (two) times daily. 60 tablet 0  . amitriptyline (ELAVIL) 50 MG tablet TAKE 1 TABLET BY MOUTH EVERY EVENING  3  . azelastine (ASTELIN) 0.1 % nasal spray Place 1 spray into both nostrils daily. Use in each nostril as directed    . BIOTIN PO Take by mouth.    . Calcium Carbonate Antacid (TUMS ULTRA 1000 PO) Take 1,000 mg by mouth 2 (two) times daily.    . cycloSPORINE (RESTASIS) 0.05 % ophthalmic emulsion Place 1 drop into both eyes 2 (two) times daily.    . ferrous sulfate 325 (65 FE) MG tablet TAKE 1 TABLET BY MOUTH 2 TIMES A DAY 30 tablet 2  . levocetirizine (XYZAL) 5 MG tablet Take 1 tablet (5 mg total) by mouth every evening. 90 tablet 1  . Multiple Vitamin (MULTIVITAMIN) tablet Take 1 tablet 2 (two) times daily by mouth.    . multivitamin-lutein (OCUVITE-LUTEIN) CAPS capsule Take 1 capsule daily by mouth.    Marland Kitchen MYRBETRIQ 50 MG TB24 tablet Take 50 mg by mouth every evening.   2  . Omega-3 Fatty Acids (FISH OIL PO) Take 2 (two) times daily by mouth.    Marland Kitchen omeprazole (PRILOSEC) 40 MG capsule TAKE 1 CAPSULE (40 MG TOTAL) BY MOUTH DAILY. 90 capsule 1  . OVER THE COUNTER MEDICATION Place 1 drop into both eyes 2 (two) times daily. Allergy eye drops    . PARoxetine (PAXIL) 30 MG tablet TAKE 1 TABLET BY MOUTH EVERY MORNING 90 tablet 1  . valACYclovir (VALTREX) 1000 MG tablet Take 2 tablets (2,000 mg total) by mouth every 12 (twelve) hours. For 2 doses at onset of symptoms 12 tablet 3  . VITAMIN A PO Take by mouth.    . Vitamin D, Ergocalciferol, (DRISDOL) 50000 units CAPS capsule TAKE 1 CAPSULE (50,000 UNITS TOTAL) BY MOUTH 2  (TWO) TIMES A WEEK. 24 capsule 1   No current facility-administered medications on file prior to visit.     BP 133/79 (BP Location: Left Arm, Patient Position: Sitting, Cuff Size: Large)   Pulse 97   Temp  98.5 F (36.9 C) (Oral)   Resp 16   Ht 5\' 4"  (1.626 m)   Wt 240 lb 9.6 oz (109.1 kg)   SpO2 92%   BMI 41.30 kg/m       Objective:   Physical Exam  Constitutional: She appears well-developed and well-nourished.  HENT:  Head: Normocephalic and atraumatic.  Right Ear: Ear canal normal.  Left Ear: Ear canal normal.  Bilateral TM's are dull without erythema  Cardiovascular: Normal rate, regular rhythm and normal heart sounds.  No murmur heard. Pulmonary/Chest: Effort normal and breath sounds normal. No respiratory distress. She has no wheezes.  Psychiatric: She has a normal mood and affect. Her behavior is normal. Judgment and thought content normal.          Assessment & Plan:  Influenza A-rapid flu test is positive for flu A.  We discussed supportive measures including Delsym for cough, and Tylenol as needed for discomfort.  She is outside the window for Tamiflu.  She is advised to call if new or worsening symptoms or if symptoms are not improved in 3 days.  She did have a flu shot this year.

## 2017-04-10 ENCOUNTER — Ambulatory Visit: Payer: BLUE CROSS/BLUE SHIELD | Admitting: Family

## 2017-04-10 ENCOUNTER — Encounter: Payer: Self-pay | Admitting: Family

## 2017-04-10 VITALS — BP 129/80 | HR 88 | Temp 98.4°F | Resp 16 | Ht 64.0 in | Wt 241.6 lb

## 2017-04-10 DIAGNOSIS — J0100 Acute maxillary sinusitis, unspecified: Secondary | ICD-10-CM

## 2017-04-10 MED ORDER — FLUCONAZOLE 150 MG PO TABS: ORAL_TABLET | ORAL | 0 refills | Status: DC

## 2017-04-10 MED ORDER — AMOXICILLIN-POT CLAVULANATE 875-125 MG PO TABS
1.0000 | ORAL_TABLET | Freq: Two times a day (BID) | ORAL | 0 refills | Status: DC
Start: 2017-04-10 — End: 2017-08-15

## 2017-04-10 NOTE — Patient Instructions (Addendum)
Please begin augmentin for sinus infection.  Call if new/worsening symptoms or if not improved in 3 days.    Sinusitis, Adult Sinusitis is soreness and inflammation of your sinuses. Sinuses are hollow spaces in the bones around your face. Your sinuses are located:  Around your eyes.  In the middle of your forehead.  Behind your nose.  In your cheekbones.  Your sinuses and nasal passages are lined with a stringy fluid (mucus). Mucus normally drains out of your sinuses. When your nasal tissues become inflamed or swollen, the mucus can become trapped or blocked so air cannot flow through your sinuses. This allows bacteria, viruses, and funguses to grow, which leads to infection. Sinusitis can develop quickly and last for 7?10 days (acute) or for more than 12 weeks (chronic). Sinusitis often develops after a cold. What are the causes? This condition is caused by anything that creates swelling in the sinuses or stops mucus from draining, including:  Allergies.  Asthma.  Bacterial or viral infection.  Abnormally shaped bones between the nasal passages.  Nasal growths that contain mucus (nasal polyps).  Narrow sinus openings.  Pollutants, such as chemicals or irritants in the air.  A foreign object stuck in the nose.  A fungal infection. This is rare.  What increases the risk? The following factors may make you more likely to develop this condition:  Having allergies or asthma.  Having had a recent cold or respiratory tract infection.  Having structural deformities or blockages in your nose or sinuses.  Having a weak immune system.  Doing a lot of swimming or diving.  Overusing nasal sprays.  Smoking.  What are the signs or symptoms? The main symptoms of this condition are pain and a feeling of pressure around the affected sinuses. Other symptoms include:  Upper toothache.  Earache.  Headache.  Bad breath.  Decreased sense of smell and taste.  A cough that  may get worse at night.  Fatigue.  Fever.  Thick drainage from your nose. The drainage is often green and it may contain pus (purulent).  Stuffy nose or congestion.  Postnasal drip. This is when extra mucus collects in the throat or back of the nose.  Swelling and warmth over the affected sinuses.  Sore throat.  Sensitivity to light.  How is this diagnosed? This condition is diagnosed based on symptoms, a medical history, and a physical exam. To find out if your condition is acute or chronic, your health care provider may:  Look in your nose for signs of nasal polyps.  Tap over the affected sinus to check for signs of infection.  View the inside of your sinuses using an imaging device that has a light attached (endoscope).  If your health care provider suspects that you have chronic sinusitis, you may also:  Be tested for allergies.  Have a sample of mucus taken from your nose (nasal culture) and checked for bacteria.  Have a mucus sample examined to see if your sinusitis is related to an allergy.  If your sinusitis does not respond to treatment and it lasts longer than 8 weeks, you may have an MRI or CT scan to check your sinuses. These scans also help to determine how severe your infection is. In rare cases, a bone biopsy may be done to rule out more serious types of fungal sinus disease. How is this treated? Treatment for sinusitis depends on the cause and whether your condition is chronic or acute. If a virus is causing your  sinusitis, your symptoms will go away on their own within 10 days. You may be given medicines to relieve your symptoms, including:  Topical nasal decongestants. They shrink swollen nasal passages and let mucus drain from your sinuses.  Antihistamines. These drugs block inflammation that is triggered by allergies. This can help to ease swelling in your nose and sinuses.  Topical nasal corticosteroids. These are nasal sprays that ease inflammation  and swelling in your nose and sinuses.  Nasal saline washes. These rinses can help to get rid of thick mucus in your nose.  If your condition is caused by bacteria, you will be given an antibiotic medicine. If your condition is caused by a fungus, you will be given an antifungal medicine. Surgery may be needed to correct underlying conditions, such as narrow nasal passages. Surgery may also be needed to remove polyps. Follow these instructions at home: Medicines  Take, use, or apply over-the-counter and prescription medicines only as told by your health care provider. These may include nasal sprays.  If you were prescribed an antibiotic medicine, take it as told by your health care provider. Do not stop taking the antibiotic even if you start to feel better. Hydrate and Humidify  Drink enough water to keep your urine clear or pale yellow. Staying hydrated will help to thin your mucus.  Use a cool mist humidifier to keep the humidity level in your home above 50%.  Inhale steam for 10-15 minutes, 3-4 times a day or as told by your health care provider. You can do this in the bathroom while a hot shower is running.  Limit your exposure to cool or dry air. Rest  Rest as much as possible.  Sleep with your head raised (elevated).  Make sure to get enough sleep each night. General instructions  Apply a warm, moist washcloth to your face 3-4 times a day or as told by your health care provider. This will help with discomfort.  Wash your hands often with soap and water to reduce your exposure to viruses and other germs. If soap and water are not available, use hand sanitizer.  Do not smoke. Avoid being around people who are smoking (secondhand smoke).  Keep all follow-up visits as told by your health care provider. This is important. Contact a health care provider if:  You have a fever.  Your symptoms get worse.  Your symptoms do not improve within 10 days. Get help right away  if:  You have a severe headache.  You have persistent vomiting.  You have pain or swelling around your face or eyes.  You have vision problems.  You develop confusion.  Your neck is stiff.  You have trouble breathing. This information is not intended to replace advice given to you by your health care provider. Make sure you discuss any questions you have with your health care provider. Document Released: 01/31/2005 Document Revised: 09/27/2015 Document Reviewed: 11/26/2014 Elsevier Interactive Patient Education  [employer].

## 2017-04-10 NOTE — Progress Notes (Signed)
Subjective:    Patient ID: [name] [name], female    DOB: June 06, 1955, 62 y.o.   MRN: [id number]  HPI  Patietn was diagnosed with flu on 04/06/17. She reports since that time she felt better on 04/07/17.  04/08/17 felt more fatigued and felt sweaty.  Yesterday she "felt horrible." Has nasal congestion and green nasal drainage.  + maxillary facial pain.   Review of Systems    see HPI  Past Medical History:  Diagnosis Date  . Allergy   . Anemia    takes iron supplement  . Anxiety   . Arthritis   . 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 Elevated alkaline phosphatase level    negative workup (presumed secondary to fatty liver)  . Frequency-urgency syndrome   . GERD (gastroesophageal reflux disease)   . Interstitial cystitis    Dr Vernie Shanks  . Neuromuscular disorder (HCC)    rt arm carpal tunnel syndrome  . Obesity    status post bariatric procedure in [facility] 2005  . Osteoporosis   . Thyroid nodule 7/10   `  . Thyroid nodule   . Varicose vein of leg      Social History   Socioeconomic History  . Marital status: Divorced    Spouse name: Not on file  . Number of children: 1  . Years of education: Not on file  . Highest education level: Not on file  Social Needs  . Financial resource strain: Not on file  . Food insecurity - worry: Not on file  . Food insecurity - inability: Not on file  . Transportation needs - medical: Not on file  . Transportation needs - non-medical: Not on file  Occupational History  . 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 Smoking status: Never Smoker  . Smokeless tobacco: Never Used  Substance and Sexual Activity  . Alcohol use: Yes    Comment: RARE  . Drug use: No  . Sexual activity: Yes    Comment: 1st intercourse 62 yo-More than 5 partners  Other Topics Concern  . Not on file  Social History Narrative  . Not on file    Past Surgical History:  Procedure Laterality Date  . ANTERIOR CERVICAL  DECOMP/DISCECTOMY FUSION N/A 06/21/2012   Procedure: ANTERIOR CERVICAL DECOMPRESSION/DISCECTOMY FUSION C-4 - C5 (SPACER/DePUY CERVICAL PLATE ONLY) 1 LEVEL;  Surgeon: Melina Schools, MD;  Location: [place];  Service: Orthopedics;  Laterality: N/A;  . BLADDER SURGERY  02/21/12 and 02/28/12   Dr Diona Fanti  . COLPOSCOPY  1980  . CRYOTHERAPY  1980  . CYSTO WITH HYDRODISTENSION  12/12/2011   Procedure: CYSTOSCOPY/HYDRODISTENSION;  Surgeon: Franchot Gallo, MD;  Location[facility];  Service: Urology;  Laterality: N/A;  30 MIN   . EYE SURGERY  1997   bilateral cataract extraction  . FOOT SURGERY     bilateral bunionettes  . FOOT SURGERY Right 01/07/14   Pt states surgery was due to arthritis. "Has pins in place now"  . GASTRIC BYPASS  2005  . JOINT REPLACEMENT  2008   left total knee  . KNEE SURGERY  1997   left knee  . NASAL SINUS SURGERY  6720   Dr Erik Obey  . REFRACTIVE SURGERY  2006   lasik  . SHOULDER SURGERY  2 2012   RIGHT   . STOMACH SURGERY  2007   "tummy tuck"  . TOTAL KNEE ARTHROPLASTY Right 12/31/2015   Procedure: RIGHT  TOTAL KNEE ARTHROPLASTY;  Surgeon: [name], MD;  Location: [facility];  Service: Orthopedics;  Laterality: Right;  Adductor Block  . VARICOSE VEIN SURGERY Right 02/2012   leg    Family History  Problem Relation Age of Onset  . Diabetes Paternal Grandfather   . Stroke Paternal Grandfather   . Hyperlipidemia Mother   . Hypertension Mother   . Hypertension Father   . Diabetes Father   . Prostate cancer Father   . Dementia Father   . Heart disease Maternal Grandmother   . Hypertension Maternal Grandmother   . Heart disease Maternal Grandfather   . Hypertension Maternal Grandfather   . Stroke Maternal Grandfather   . Colon cancer Neg Hx   . Esophageal cancer Neg Hx   . Pancreatic cancer Neg Hx   . Stomach cancer Neg Hx     Allergies  Allergen Reactions  . 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  No  allergy.  . Nitrofurantoin     REACTION: fever    Current Outpatient Medications on File Prior to Visit  Medication Sig Dispense Refill  . acetaminophen (TYLENOL) 650 MG CR tablet Take 2 tablets (1,300 mg total) by mouth every 8 (eight) hours as needed for pain. Take for mild pain. Do not combine with Percocet. Do not exceed daily recommended limit of Tylenol.    . ALPRAZolam (XANAX) 0.5 MG tablet Take 1 tablet (0.5 mg total) by mouth 2 (two) times daily. 60 tablet 0  . amitriptyline (ELAVIL) 50 MG tablet TAKE 1 TABLET BY MOUTH EVERY EVENING  3  . azelastine (ASTELIN) 0.1 % nasal spray Place 1 spray into both nostrils daily. Use in each nostril as directed    . BIOTIN PO Take by mouth.    . Calcium Carbonate Antacid (TUMS ULTRA 1000 PO) Take 1,000 mg by mouth 2 (two) times daily.    . cycloSPORINE (RESTASIS) 0.05 % ophthalmic emulsion Place 1 drop into both eyes 2 (two) times daily.    . ferrous sulfate 325 (65 FE) MG tablet TAKE 1 TABLET BY MOUTH 2 TIMES A DAY 30 tablet 2  . levocetirizine (XYZAL) 5 MG tablet Take 1 tablet (5 mg total) by mouth every evening. 90 tablet 1  . Multiple Vitamin (MULTIVITAMIN) tablet Take 1 tablet 2 (two) times daily by mouth.    . multivitamin-lutein (OCUVITE-LUTEIN) CAPS capsule Take 1 capsule daily by mouth.    Marland Kitchen MYRBETRIQ 50 MG TB24 tablet Take 50 mg by mouth every evening.   2  . Omega-3 Fatty Acids (FISH OIL PO) Take 2 (two) times daily by mouth.    Marland Kitchen omeprazole (PRILOSEC) 40 MG capsule TAKE 1 CAPSULE (40 MG TOTAL) BY MOUTH DAILY. 90 capsule 1  . OVER THE COUNTER MEDICATION Place 1 drop into both eyes 2 (two) times daily. Allergy eye drops    . PARoxetine (PAXIL) 30 MG tablet TAKE 1 TABLET BY MOUTH EVERY MORNING 90 tablet 1  . valACYclovir (VALTREX) 1000 MG tablet Take 2 tablets (2,000 mg total) by mouth every 12 (twelve) hours. For 2 doses at onset of symptoms 12 tablet 3  . VITAMIN A PO Take by mouth.    . Vitamin D, Ergocalciferol, (DRISDOL) 50000 units  CAPS capsule TAKE 1 CAPSULE (50,000 UNITS TOTAL) BY MOUTH 2 (TWO) TIMES A WEEK. 24 capsule 1   No current facility-administered medications on file prior to visit.     BP 129/80 (BP Location: Right Arm,  Patient Position: Sitting, Cuff Size: Large)   Pulse 88   Temp 98.4 F (36.9 C) (Oral)   Resp 16   Ht 5\' 4"  (1.626 m)   Wt 241 lb 9.6 oz (109.6 kg)   SpO2 95%   BMI 41.47 kg/m    Objective:   Physical Exam  Constitutional: She is oriented to person, place, and time. She appears well-developed and well-nourished.  HENT:  Head: Normocephalic and atraumatic.  Nose: Right sinus exhibits maxillary sinus tenderness. Left sinus exhibits maxillary sinus tenderness.  Bilateral serous effusions, without erythema  Cardiovascular: Normal rate, regular rhythm and normal heart sounds.  No murmur heard. Pulmonary/Chest: Effort normal and breath sounds normal. No respiratory distress. She has no wheezes.  Musculoskeletal: She exhibits no edema.  Neurological: She is alert and oriented to person, place, and time.  Skin: Skin is warm and dry.  Psychiatric: She has a normal mood and affect. Her behavior is normal. Judgment and thought content normal.          Assessment & Plan:  Bilateral Maxillary sinusitis- new. Rx with augmentin. She requests rx for diflucan prn. She is advised to call if new/worsening symptoms or if not improved in 3 days.Pt verbalizes understanding.

## 2017-04-27 ENCOUNTER — Encounter: Payer: Self-pay | Admitting: Family

## 2017-04-27 ENCOUNTER — Other Ambulatory Visit: Payer: Self-pay | Admitting: Family

## 2017-04-27 MED ORDER — ALPRAZOLAM 0.5 MG PO TABS: 0.5000 mg | ORAL_TABLET | Freq: Two times a day (BID) | ORAL | 0 refills | Status: DC

## 2017-04-27 NOTE — Telephone Encounter (Signed)
Pt is requesting refill on alprazolam.   Last OV: 04/10/2017 Last Fill: 03/28/2017 #60 and 0RF UDS: 01/16/2017   Please advise.

## 2017-05-16 ENCOUNTER — Telehealth: Payer: Self-pay | Admitting: *Deleted

## 2017-05-16 MED ORDER — OMEPRAZOLE 40 MG PO CPDR: 40.0000 mg | DELAYED_RELEASE_CAPSULE | Freq: Every day | ORAL | 1 refills | Status: DC

## 2017-05-16 NOTE — Telephone Encounter (Signed)
Received fax from [facility] requesting 90 day supply of omeprazole. Refill sent.

## 2017-05-29 ENCOUNTER — Other Ambulatory Visit: Payer: Self-pay | Admitting: Family

## 2017-06-29 ENCOUNTER — Other Ambulatory Visit: Payer: Self-pay | Admitting: Family

## 2017-06-30 ENCOUNTER — Encounter: Payer: Self-pay | Admitting: Family

## 2017-06-30 NOTE — Telephone Encounter (Signed)
Please contact pt and let her know that I sent refill, however I see that she has received some narcotic meds from orthopedics.  She should not receive controlled substances from other providers per her

## 2017-06-30 NOTE — Telephone Encounter (Signed)
Please advise pt not to take xanax while taking pain medication. I saw she had recent rx from her orthopedic doctor. rx sent.

## 2017-06-30 NOTE — Telephone Encounter (Signed)
Last alprazolam RX: 04/27/17, #60 x no refills Last OV: 04/10/17, acute visit Next OV: none scheduled. When is she due for routine f/u? UDS: 01/16/17, low risk CSC: 06/16/16 CSR: Please see [facility] report in red folder. Received hydrocodone from [facility]. Please advise?

## 2017-06-30 NOTE — Telephone Encounter (Signed)
Needs 6 month follow up please.  

## 2017-07-03 NOTE — Telephone Encounter (Signed)
Notified pt and she voices understanding. States pain med has been prescribed temporarily due to recent broken arm. Made pt aware to contact us in the future if any other prescriber send RX for any type of controlled / narcotic medication. Pt voices understanding.

## 2017-07-03 NOTE — Telephone Encounter (Signed)
MyChart message sent for pt. To call [facility] to schedule a six month f/u per Lenna Sciara, NP.

## 2017-07-18 ENCOUNTER — Telehealth: Payer: Self-pay

## 2017-07-18 NOTE — Telephone Encounter (Signed)
Author reached out to pt. to make an appointment per [facility] request, needing UDS at next visit. Pt. Made appointment for 9/11 at 0800.

## 2017-08-02 ENCOUNTER — Other Ambulatory Visit: Payer: Self-pay | Admitting: Family

## 2017-08-02 ENCOUNTER — Encounter: Payer: Self-pay | Admitting: Family

## 2017-08-02 NOTE — Telephone Encounter (Signed)
See 08/02/17 Rx request from pharmacy.

## 2017-08-02 NOTE — Telephone Encounter (Signed)
Last alprazolam RX: 06/30/17, #60 Last OV: 04/10/17 Next OV: 10/2017 UDS: 01/16/17 CSC: 06/16/16 and needs to be updated CSR: Hydrocodone and Oxycodone have been prescribed for pt since April and is not on current med list.  Please advise?

## 2017-08-15 ENCOUNTER — Encounter: Payer: Self-pay | Admitting: Family

## 2017-08-15 ENCOUNTER — Ambulatory Visit: Payer: BLUE CROSS/BLUE SHIELD | Admitting: Family

## 2017-08-15 VITALS — BP 127/68 | HR 100 | Temp 98.7°F | Resp 16 | Ht 64.0 in | Wt 242.0 lb

## 2017-08-15 DIAGNOSIS — Z9109 Other allergy status, other than to drugs and biological substances: Secondary | ICD-10-CM

## 2017-08-15 DIAGNOSIS — J0110 Acute frontal sinusitis, unspecified: Secondary | ICD-10-CM | POA: Diagnosis not present

## 2017-08-15 MED ORDER — FLUCONAZOLE 150 MG PO TABS: ORAL_TABLET | ORAL | 0 refills | Status: DC

## 2017-08-15 MED ORDER — LEVOCETIRIZINE DIHYDROCHLORIDE 5 MG PO TABS: 5.0000 mg | ORAL_TABLET | Freq: Every evening | ORAL | 5 refills | Status: DC

## 2017-08-15 MED ORDER — AMOXICILLIN-POT CLAVULANATE 875-125 MG PO TABS: 1.0000 | ORAL_TABLET | Freq: Two times a day (BID) | ORAL | 0 refills | Status: DC

## 2017-08-15 NOTE — Progress Notes (Signed)
Subjective:    Patient ID: [name] [name], female    DOB: [birth year], 62 y.o.   MRN: [id number]  HPI  Patient is a 62 yr old female who presents today with chief complaint of sinus pain and right ear pain.  Reports that her symptoms began about 10 days ago. Had sneezing, congestion.  This past Sunday "I started to feel really bad."  Spent most of the day in the bed.  Reports that she is starting to cough up thick yellow sputum.  Has general malaise. She is using allegra, flonase and alaway eyedrops.         Review of Systems See HPI  Past Medical History:  Diagnosis Date  . Allergy   . Anemia    takes iron supplement  . Anxiety   . Arthritis   . 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 Elevated alkaline phosphatase level    negative workup (presumed secondary to fatty liver)  . Frequency-urgency syndrome   . GERD (gastroesophageal reflux disease)   . Interstitial cystitis    Dr Vernie Shanks  . Neuromuscular disorder (HCC)    rt arm carpal tunnel syndrome  . Obesity    status post bariatric procedure in [facility] 2005  . Osteoporosis   . Thyroid nodule 7/10   `  . Thyroid nodule   . Varicose vein of leg      Social History   Socioeconomic History  . Marital status: Divorced    Spouse name: Not on file  . Number of children: 1  . Years of education: Not on file  . Highest education level: Not on file  Occupational History  . 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[employer]  . Financial resource strain: Not on file  . Food insecurity:    Worry: Not on file    Inability: Not on file  . Transportation needs:    Medical: Not on file    Non-medical: Not on file  Tobacco Use  . Smoking status: Never Smoker  . Smokeless tobacco: Never Used  Substance and Sexual Activity  . Alcohol use: Yes    Comment: RARE  . Drug use: No  . Sexual activity: Yes    Comment: 1st intercourse 62 yo-More than 5 partners  Lifestyle  . Physical activity:    Days  per week: Not on file    Minutes per session: Not on file  . Stress: Not on file  Relationships  . Social connections:    Talks on phone: Not on file    Gets together: Not on file    Attends religious service: Not on file    Active member of club or organization: Not on file    Attends meetings of clubs or organizations: Not on file    Relationship status: Not on file  . Intimate partner violence:    Fear of current or ex partner: Not on file    Emotionally abused: Not on file    Physically abused: Not on file    Forced sexual activity: Not on file  Other Topics Concern  . Not on file  Social History Narrative  . Not on file    Past Surgical History:  Procedure Laterality Date  . ANTERIOR CERVICAL DECOMP/DISCECTOMY FUSION N/A 06/21/2012   Procedure: ANTERIOR CERVICAL DECOMPRESSION/DISCECTOMY FUSION C-4 - C5 (SPACER/DePUY CERVICAL PLATE ONLY) 1 LEVEL;  Surgeon: Melina Schools, MD;  Location: [place];  Service: Orthopedics;  Laterality: N/A;  .  BLADDER SURGERY  02/21/12 and 02/28/12   Dr Diona Fanti  . COLPOSCOPY  1980  . CRYOTHERAPY  1980  . CYSTO WITH HYDRODISTENSION  12/12/2011   Procedure: CYSTOSCOPY/HYDRODISTENSION;  Surgeon: Franchot Gallo, MD;  Location: [facility];  Service: Urology;  Laterality: N/A;  30 MIN   . EYE SURGERY  1997   bilateral cataract extraction  . FOOT SURGERY     bilateral bunionettes  . FOOT SURGERY Right 01/07/14   Pt states surgery was due to arthritis. "Has pins in place now"  . GASTRIC BYPASS  2005  . JOINT REPLACEMENT  2008   left total knee  . KNEE SURGERY  1997   left knee  . NASAL SINUS SURGERY  3710   Dr Erik Obey  . REFRACTIVE SURGERY  2006   lasik  . SHOULDER SURGERY  2 2012   RIGHT   . STOMACH SURGERY  2007   "tummy tuck"  . TOTAL KNEE ARTHROPLASTY Right 12/31/2015   Procedure: RIGHT TOTAL KNEE ARTHROPLASTY;  Surgeon: [name], MD;  Location: [facility];  Service: Orthopedics;  Laterality: Right;  Adductor Block  .  VARICOSE VEIN SURGERY Right 02/2012   leg    Family History  Problem Relation Age of Onset  . Diabetes Paternal Grandfather   . Stroke Paternal Grandfather   . Hyperlipidemia Mother   . Hypertension Mother   . Hypertension Father   . Diabetes Father   . Prostate cancer Father   . Dementia Father   . Heart disease Maternal Grandmother   . Hypertension Maternal Grandmother   . Heart disease Maternal Grandfather   . Hypertension Maternal Grandfather   . Stroke Maternal Grandfather   . Colon cancer Neg Hx   . Esophageal cancer Neg Hx   . Pancreatic cancer Neg Hx   . Stomach cancer Neg Hx     Allergies  Allergen Reactions  . 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  No allergy.  . Nitrofurantoin     REACTION: fever    Current Outpatient Medications on File Prior to Visit  Medication Sig Dispense Refill  . acetaminophen (TYLENOL) 650 MG CR tablet Take 2 tablets (1,300 mg total) by mouth every 8 (eight) hours as needed for pain. Take for mild pain. Do not combine with Percocet. Do not exceed daily recommended limit of Tylenol.    . ALPRAZolam (XANAX) 0.5 MG tablet TAKE 1 TABLET (0.5 MG TOTAL) BY MOUTH 2 (TWO) TIMES DAILY. 60 tablet 0  . amitriptyline (ELAVIL) 50 MG tablet TAKE 1 TABLET BY MOUTH EVERY EVENING  3  . BIOTIN PO Take by mouth.    . Calcium Carbonate Antacid (TUMS ULTRA 1000 PO) Take 1,000 mg by mouth 2 (two) times daily.    . ferrous sulfate 325 (65 FE) MG tablet TAKE 1 TABLET BY MOUTH 2 TIMES A DAY 30 tablet 2  . levocetirizine (XYZAL) 5 MG tablet Take 1 tablet (5 mg total) by mouth every evening. 90 tablet 1  . Lifitegrast (XIIDRA) 5 % SOLN Xiidra 5 % eye drops in a dropperette  INSTILL 1 DROP INTO BOTH EYES TWICE A DAY AS DIRECTED    . Multiple Vitamin (MULTIVITAMIN) tablet Take 1 tablet 2 (two) times daily by mouth.    . multivitamin-lutein (OCUVITE-LUTEIN) CAPS capsule Take 1 capsule daily by mouth.    Marland Kitchen MYRBETRIQ 50 MG TB24  tablet Take 50 mg by mouth every evening.   2  .  Omega-3 Fatty Acids (FISH OIL PO) Take 2 (two) times daily by mouth.    Marland Kitchen omeprazole (PRILOSEC) 40 MG capsule Take 1 capsule (40 mg total) by mouth daily. 90 capsule 1  . OVER THE COUNTER MEDICATION Place 1 drop into both eyes 2 (two) times daily. Allergy eye drops    . PARoxetine (PAXIL) 30 MG tablet TAKE 1 TABLET BY MOUTH EVERY MORNING 90 tablet 1  . valACYclovir (VALTREX) 1000 MG tablet Take 2 tablets (2,000 mg total) by mouth every 12 (twelve) hours. For 2 doses at onset of symptoms 12 tablet 3  . VITAMIN A PO Take by mouth.    . Vitamin D, Ergocalciferol, (DRISDOL) 50000 units CAPS capsule TAKE 1 CAPSULE (50,000 UNITS TOTAL) BY MOUTH 2 (TWO) TIMES A WEEK. 24 capsule 1  . amoxicillin-clavulanate (AUGMENTIN) 875-125 MG tablet Take 1 tablet by mouth 2 (two) times daily. (Patient not taking: Reported on 08/15/2017) 20 tablet 0   No current facility-administered medications on file prior to visit.     BP 127/68 (BP Location: Right Arm, Patient Position: Sitting, Cuff Size: Large)   Pulse 100   Temp 98.7 F (37.1 C) (Oral)   Resp 16   Ht 5\' 4"  (1.626 m)   Wt 242 lb (109.8 kg)   SpO2 95%   BMI 41.54 kg/m       Objective:   Physical Exam  Constitutional: She appears well-developed and well-nourished.  HENT:  Head: Normocephalic and atraumatic.  Right Ear: Tympanic membrane and ear canal normal.  Left Ear: Tympanic membrane and ear canal normal.  Nose: Right sinus exhibits frontal sinus tenderness. Right sinus exhibits no maxillary sinus tenderness. Left sinus exhibits frontal sinus tenderness. Left sinus exhibits no maxillary sinus tenderness.  Mouth/Throat: No oropharyngeal exudate, posterior oropharyngeal edema or posterior oropharyngeal erythema.  Cardiovascular: Normal rate, regular rhythm and normal heart sounds.  No murmur heard. Pulmonary/Chest: Effort normal and breath sounds normal. No respiratory distress. She has no wheezes.    Skin: Skin is warm and dry.  Psychiatric: She has a normal mood and affect. Her behavior is normal. Judgment and thought content normal.          Assessment & Plan:  Sinusitis- rx with augmentin (requests diflucan as needed for yeast). Also requests refill of xyzal. Continue flonase.  Advised pt to call if symptoms worsen or fail to improve.

## 2017-08-15 NOTE — Patient Instructions (Signed)
Please begin augmentin for sinus infection. Call if symptoms worsen or if they do not improve.

## 2017-08-31 ENCOUNTER — Other Ambulatory Visit: Payer: Self-pay | Admitting: Family

## 2017-08-31 NOTE — Telephone Encounter (Signed)
Last alprazolam RX: 08/03/17, #60 Last OV: 08/15/17 Next OV: 10/25/17 UDS: 01/16/17 CSC: 06/16/16 and past due. CSR:  No discrepancies identified

## 2017-09-01 ENCOUNTER — Telehealth: Payer: Self-pay | Admitting: Family

## 2017-09-01 DIAGNOSIS — F419 Anxiety disorder, unspecified: Secondary | ICD-10-CM

## 2017-09-01 DIAGNOSIS — Z79899 Other long term (current) drug therapy: Secondary | ICD-10-CM

## 2017-09-01 NOTE — Telephone Encounter (Signed)
Refill sent on xanax.  Let's have her come by the office to update controlled substance contract and provide UDS.

## 2017-09-04 NOTE — Telephone Encounter (Signed)
Notified pt and she voices understanding. Currently caring for her grandchildren and requests appt later in the week. Lab appt scheduled for Friday at 8am and contract placed at the front desk.

## 2017-09-08 ENCOUNTER — Other Ambulatory Visit: Payer: BLUE CROSS/BLUE SHIELD

## 2017-09-08 DIAGNOSIS — F419 Anxiety disorder, unspecified: Secondary | ICD-10-CM

## 2017-09-08 DIAGNOSIS — Z79899 Other long term (current) drug therapy: Secondary | ICD-10-CM

## 2017-09-11 LAB — PAIN MGMT, PROFILE 8 W/CONF, U
6 ACETYLMORPHINE: NEGATIVE ng/mL (ref <10)
AMPHETAMINES: NEGATIVE ng/mL (ref <500)
Alcohol Metabolites: NEGATIVE ng/mL (ref <500)
Alphahydroxyalprazolam: 144 ng/mL — ABNORMAL HIGH (ref <25)
Alphahydroxymidazolam: NEGATIVE ng/mL (ref <50)
Alphahydroxytriazolam: NEGATIVE ng/mL (ref <50)
Aminoclonazepam: NEGATIVE ng/mL (ref <25)
BENZODIAZEPINES: POSITIVE ng/mL — AB (ref <100)
Buprenorphine, Urine: NEGATIVE ng/mL (ref <5)
CREATININE: 71.6 mg/dL
Cocaine Metabolite: NEGATIVE ng/mL (ref <150)
Hydroxyethylflurazepam: NEGATIVE ng/mL (ref <50)
LORAZEPAM: NEGATIVE ng/mL (ref <50)
MDMA: NEGATIVE ng/mL (ref <500)
Marijuana Metabolite: NEGATIVE ng/mL (ref <20)
Nordiazepam: NEGATIVE ng/mL (ref <50)
OPIATES: NEGATIVE ng/mL (ref <100)
OXAZEPAM: NEGATIVE ng/mL (ref <50)
OXIDANT: NEGATIVE ug/mL (ref <200)
OXYCODONE: NEGATIVE ng/mL (ref <100)
PH: 6.34 (ref 4.5–9.0)
Temazepam: NEGATIVE ng/mL (ref <50)

## 2017-09-14 ENCOUNTER — Telehealth: Payer: Self-pay | Admitting: Medical

## 2017-09-14 ENCOUNTER — Encounter: Payer: Self-pay | Admitting: Medical

## 2017-09-14 ENCOUNTER — Ambulatory Visit: Payer: Self-pay

## 2017-09-14 ENCOUNTER — Ambulatory Visit: Payer: BLUE CROSS/BLUE SHIELD | Admitting: Medical

## 2017-09-14 ENCOUNTER — Ambulatory Visit: Payer: Self-pay | Admitting: Family

## 2017-09-14 VITALS — BP 130/80 | HR 87 | Temp 98.2°F | Resp 16 | Ht 64.0 in | Wt 242.8 lb

## 2017-09-14 DIAGNOSIS — H938X1 Other specified disorders of right ear: Secondary | ICD-10-CM

## 2017-09-14 DIAGNOSIS — R42 Dizziness and giddiness: Secondary | ICD-10-CM | POA: Diagnosis not present

## 2017-09-14 DIAGNOSIS — J3489 Other specified disorders of nose and nasal sinuses: Secondary | ICD-10-CM

## 2017-09-14 MED ORDER — DOXYCYCLINE HYCLATE 100 MG PO TABS: 100.0000 mg | ORAL_TABLET | Freq: Two times a day (BID) | ORAL | 0 refills | Status: DC

## 2017-09-14 NOTE — Telephone Encounter (Signed)
Appt at [facility] w/ Percell Miller.

## 2017-09-14 NOTE — Telephone Encounter (Signed)
Will you get pt paced on lab schedule for 09-15-2017 at 11 am.

## 2017-09-14 NOTE — Telephone Encounter (Signed)
Light headed and dizziness- patient has a history of vertigo and inner ear dizziness- she states she feels she is getting worse and she may not be able to drive tomorrow.  Reason for Disposition . [1] MODERATE dizziness (e.g., interferes with normal activities) AND [2] has been evaluated by physician for this  Answer Assessment - Initial Assessment Questions 1. DESCRIPTION: "Describe your dizziness."     Patient feels ahe can't see as well- she feels like she spins 2. LIGHTHEADED: "Do you feel lightheaded?" (e.g., somewhat faint, woozy, weak upon standing)     No- nauseated 3. VERTIGO: "Do you feel like either you or the room is spinning or tilting?" (i.e. vertigo)     When patient lays down- th room spins 4. SEVERITY: "How bad is it?"  "Do you feel like you are going to faint?" "Can you stand and walk?"   - MILD - walking normally   - MODERATE - interferes with normal activities (e.g., work, school)    - SEVERE - unable to stand, requires support to walk, feels like passing out now.      mild 5. ONSET:  "When did the dizziness begin?"     Last night 6. AGGRAVATING FACTORS: "Does anything make it worse?" (e.g., standing, change in head position)     Moving head quickly, laying down 7. HEART RATE: "Can you tell me your heart rate?" "How many beats in 15 seconds?"  (Note: not all patients can do this)       no 8. CAUSE: "What do you think is causing the dizziness?"     Sinus/allergy, sinus headache 9. RECURRENT SYMPTOM: "Have you had dizziness before?" If so, ask: "When was the last time?" "What happened that time?"     Yes- inner ear fluid  10. OTHER SYMPTOMS: "Do you have any other symptoms?" (e.g., fever, chest pain, vomiting, diarrhea, bleeding)       nausae 11. PREGNANCY: "Is there any chance you are pregnant?" "When was your last menstrual period?"       n/a  Protocols used: DIZZINESS [facility]

## 2017-09-14 NOTE — Telephone Encounter (Signed)
This encounter was created in error - please disregard.

## 2017-09-14 NOTE — Telephone Encounter (Signed)
Charted in error.

## 2017-09-14 NOTE — Progress Notes (Addendum)
Subjective:    Patient ID: [name] [name], female    DOB: October 14, 1955, 62 y.o.   MRN: [id number]  HPI  Pt in states she had some dizziness that started last night. Some improved initially with dramamine last night. But then dizziness worsened today. She notes with movement her dizziness is worse. When she turns her head is spinning. When she bends over feels dizzy. If she keeps her head straight and upright no dizziness. No ha or gross motor/sensory function deficits associated with dizziness.   Some has post nasal drainage and stuffy. Pt finished antibiotic about 10 days ago. She was on augmentin previously. Her rt ear feels full. Ear fullness just started yesterday.   Review of Systems  Constitutional: Negative for chills, fatigue and fever.  HENT: Positive for congestion and ear pain. Negative for sinus pain and sneezing.        Ethmoid sinus  Respiratory: Negative for chest tightness, shortness of breath and wheezing.   Cardiovascular: Negative for chest pain and palpitations.  Gastrointestinal: Negative for abdominal pain.  Musculoskeletal: Negative for back pain.  Neurological: Positive for dizziness. Negative for tremors, seizures, syncope, weakness, numbness and headaches.  Psychiatric/Behavioral: Negative for behavioral problems, confusion, decreased concentration and dysphoric mood.   Past Medical History:  Diagnosis Date  . Allergy   . Anemia    takes iron supplement  . Anxiety   . Arthritis   . 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 Elevated alkaline phosphatase level    negative workup (presumed secondary to fatty liver)  . Frequency-urgency syndrome   . GERD (gastroesophageal reflux disease)   . Interstitial cystitis    Dr Vernie Shanks  . Neuromuscular disorder (HCC)    rt arm carpal tunnel syndrome  . Obesity    status post bariatric procedure in [facility] 2005  . Osteoporosis   . Thyroid nodule 7/10   `  . Thyroid nodule   . Varicose vein of leg      Social  History   Socioeconomic History  . Marital status: Divorced    Spouse name: Not on file  . Number of children: 1  . Years of education: Not on file  . Highest education level: Not on file  Occupational History  . 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 Financial resource strain: Not on file  . Food insecurity:    Worry: Not on file    Inability: Not on file  . Transportation needs:    Medical: Not on file    Non-medical: Not on file  Tobacco Use  . Smoking status: Never Smoker  . Smokeless tobacco: Never Used  Substance and Sexual Activity  . Alcohol use: Yes    Comment: RARE  . Drug use: No  . Sexual activity: Yes    Comment: 1st intercourse 62 yo-More than 5 partners  Lifestyle  . Physical activity:    Days per week: Not on file    Minutes per session: Not on file  . Stress: Not on file  Relationships  . Social connections:    Talks on phone: Not on file    Gets together: Not on file    Attends religious service: Not on file    Active member of club or organization: Not on file    Attends meetings of clubs or organizations: Not on file    Relationship status: Not on file  . Intimate partner violence:    Fear  of current or ex partner: Not on file    Emotionally abused: Not on file    Physically abused: Not on file    Forced sexual activity: Not on file  Other Topics Concern  . Not on file  Social History Narrative  . Not on file    Past Surgical History:  Procedure Laterality Date  . ANTERIOR CERVICAL DECOMP/DISCECTOMY FUSION N/A 06/21/2012   Procedure: ANTERIOR CERVICAL DECOMPRESSION/DISCECTOMY FUSION C-4 - C5 (SPACER/DePUY CERVICAL PLATE ONLY) 1 LEVEL;  Surgeon: Melina Schools, MD;  Location: [place];  Service: Orthopedics;  Laterality: N/A;  . BLADDER SURGERY  02/21/12 and 02/28/12   Dr Diona Fanti  . COLPOSCOPY  1980  . CRYOTHERAPY  1980  . CYSTO WITH HYDRODISTENSION  12/12/2011   Procedure: CYSTOSCOPY/HYDRODISTENSION;   Surgeon: Franchot Gallo, MD;  Location: [facility];  Service: Urology;  Laterality: N/A;  30 MIN   . EYE SURGERY  1997   bilateral cataract extraction  . FOOT SURGERY     bilateral bunionettes  . FOOT SURGERY Right 01/07/14   Pt states surgery was due to arthritis. "Has pins in place now"  . GASTRIC BYPASS  2005  . JOINT REPLACEMENT  2008   left total knee  . KNEE SURGERY  1997   left knee  . NASAL SINUS SURGERY  5102   Dr Erik Obey  . REFRACTIVE SURGERY  2006   lasik  . SHOULDER SURGERY  2 2012   RIGHT   . STOMACH SURGERY  2007   "tummy tuck"  . TOTAL KNEE ARTHROPLASTY Right 12/31/2015   Procedure: RIGHT TOTAL KNEE ARTHROPLASTY;  Surgeon: [name], MD;  Location: [facility];  Service: Orthopedics;  Laterality: Right;  Adductor Block  . VARICOSE VEIN SURGERY Right 02/2012   leg    Family History  Problem Relation Age of Onset  . Diabetes Paternal Grandfather   . Stroke Paternal Grandfather   . Hyperlipidemia Mother   . Hypertension Mother   . Hypertension Father   . Diabetes Father   . Prostate cancer Father   . Dementia Father   . Heart disease Maternal Grandmother   . Hypertension Maternal Grandmother   . Heart disease Maternal Grandfather   . Hypertension Maternal Grandfather   . Stroke Maternal Grandfather   . Colon cancer Neg Hx   . Esophageal cancer Neg Hx   . Pancreatic cancer Neg Hx   . Stomach cancer Neg Hx     Allergies  Allergen Reactions  . 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  No allergy.  . Nitrofurantoin     REACTION: fever    Current Outpatient Medications on File Prior to Visit  Medication Sig Dispense Refill  . acetaminophen (TYLENOL) 650 MG CR tablet Take 2 tablets (1,300 mg total) by mouth every 8 (eight) hours as needed for pain. Take for mild pain. Do not combine with Percocet. Do not exceed daily recommended limit of Tylenol.    . ALPRAZolam (XANAX) 0.5 MG tablet TAKE 1 TABLET (0.5  MG TOTAL) BY MOUTH 2 (TWO) TIMES DAILY. 60 tablet 0  . amitriptyline (ELAVIL) 50 MG tablet TAKE 1 TABLET BY MOUTH EVERY EVENING  3  . BIOTIN PO Take by mouth.    . Calcium Carbonate Antacid (TUMS ULTRA 1000 PO) Take 1,000 mg by mouth 2 (two) times daily.    . ferrous sulfate 325 (65 FE) MG tablet TAKE 1 TABLET BY MOUTH 2 TIMES A DAY  30 tablet 2  . fluconazole (DIFLUCAN) 150 MG tablet Take 1 tab at start of yeast infection, may repeat in 3 days if symptoms are not improved. 2 tablet 0  . levocetirizine (XYZAL) 5 MG tablet Take 1 tablet (5 mg total) by mouth every evening. 30 tablet 5  . Lifitegrast (XIIDRA) 5 % SOLN Xiidra 5 % eye drops in a dropperette  INSTILL 1 DROP INTO BOTH EYES TWICE A DAY AS DIRECTED    . Multiple Vitamin (MULTIVITAMIN) tablet Take 1 tablet 2 (two) times daily by mouth.    . multivitamin-lutein (OCUVITE-LUTEIN) CAPS capsule Take 1 capsule daily by mouth.    Marland Kitchen MYRBETRIQ 50 MG TB24 tablet Take 50 mg by mouth every evening.   2  . Omega-3 Fatty Acids (FISH OIL PO) Take 2 (two) times daily by mouth.    Marland Kitchen omeprazole (PRILOSEC) 40 MG capsule Take 1 capsule (40 mg total) by mouth daily. 90 capsule 1  . OVER THE COUNTER MEDICATION Place 1 drop into both eyes 2 (two) times daily. Allergy eye drops    . PARoxetine (PAXIL) 30 MG tablet TAKE 1 TABLET BY MOUTH EVERY MORNING 90 tablet 1  . valACYclovir (VALTREX) 1000 MG tablet Take 2 tablets (2,000 mg total) by mouth every 12 (twelve) hours. For 2 doses at onset of symptoms 12 tablet 3  . VITAMIN A PO Take by mouth.    . Vitamin D, Ergocalciferol, (DRISDOL) 50000 units CAPS capsule TAKE 1 CAPSULE (50,000 UNITS TOTAL) BY MOUTH 2 (TWO) TIMES A WEEK. 24 capsule 1   No current facility-administered medications on file prior to visit.     BP 130/80   Pulse 87   Temp 98.2 F (36.8 C) (Oral)   Resp 16   Ht 5\' 4"  (1.626 m)   Wt 242 lb 12.8 oz (110.1 kg)   SpO2 99%   BMI 41.68 kg/m       Objective:   Physical  Exam  General  Mental Status - Alert. General Appearance - Well groomed. Not in acute distress.  Skin Rashes- No Rashes.  HEENT Head- Normal. Ear Auditory Canal - Left- Normal. Right - Normal.Tympanic Membrane- Left- mild dull tm. Right- mild dull tm. Eye Sclera/Conjunctiva- Left- Normal. Right- Normal. Nose & Sinuses Nasal Mucosa- Left-  Boggy and Congested. Right-  Boggy and  Congested.Bilateral no maxillary and no  frontal sinus pressure. But ethmoid sinus pressure to palpation Mouth & Throat Lips: Upper Lip- Normal: no dryness, cracking, pallor, cyanosis, or vesicular eruption. Lower Lip-Normal: no dryness, cracking, pallor, cyanosis or vesicular eruption. Buccal Mucosa- Bilateral- No Aphthous ulcers. Oropharynx- No Discharge or Erythema. Tonsils: Characteristics- Bilateral- No Erythema or Congestion. Size/Enlargement- Bilateral- No enlargement. Discharge- bilateral-None.  Neck Neck- Supple. No Masses.   Chest and Lung Exam Auscultation: Breath Sounds:-Clear even and unlabored.  Cardiovascular Auscultation:Rythm- Regular, rate and rhythm. Murmurs & Other Heart Sounds:Ausculatation of the heart reveal- No Murmurs.  Lymphatic Head & Neck General Head & Neck Lymphatics: Bilateral: Description- No Localized lymphadenopathy.    Neurologic Cranial Nerve exam:- CN III-XII intact(No nystagmus), symmetric smile. Drift Test:- No drift. Romberg Exam:- Negative.  Heal to Toe Gait exam:-good but hx of knee replacements. Finger to Nose:- Normal/Intact Strength:- 5/5 equal and symmetric strength both upper and lower extremities. On lying supine transient dizziness. On moving head to rt vertigo induced. When head returned to midline position resolves.       Assessment & Plan:  For your dizzines/vertigo. I want you to continue antivert  ever 8 hours presently. Then after 24 hours just use as needed every 8-12 hours for dizziness that persist more than 5 minutes.  I want you  to also try Epley maneuvers.  See if this helps.  If any gross motor or sensory function deficits with dizziness then recommend ED evaluation.  I want you to also come back tomorrow morning around 11 AM for CMP and CBC.  If your dizziness is not improved by tomorrow then want to order CT of the head without contrast.  I do want you to continue your Flonase and add Astelin nasal spray for about 3 days.  If your sinus pressure worsens or if your right ear pressure worsens then you could start doxycycline.  I sent that prescription to your pharmacy.  Work excuse for tomorrow.  Follow-up in 7 days or as needed.  Patient came in for her blood work today.  Her dizziness is about the same as it was yesterday.  No  Has no new signs or symptoms associated with dizziness.  However she did note that she woke up with right ear pain that feels deep and pain just below the right ear.  On exam her right tympanic membrane looks a little bit more red compared to yesterday.  Her neurologic exam was normal.  EOMs were intact.  Finger-to-nose was normal.  Equal and symmetric upper extremity strength bilaterally.  No slurred speech.  I decided to give patient 1 g Rocephin IM today.  She will do her weight down.  Advised her to start doxycycline.  And will send meclizine to her pharmacy.  If she has worsening dizziness or any associated neurologic signs symptoms over the weekend then recommend ED evaluation for CT imaging.  Patient expressed understanding.

## 2017-09-14 NOTE — Patient Instructions (Addendum)
For your dizzines/vertigo. I want you to continue antivert ever 8 hours presently. Then after 24 hours just use as needed every 8-12 hours for dizziness that persist more than 5 minutes.  I want you to also try Epley maneuvers.  See if this helps.  If any gross motor or sensory function deficits with dizziness then recommend ED evaluation.  I want you to also come back tomorrow morning around 11 AM for CMP and CBC.  If your dizziness is not improved by tomorrow then want to order CT of the head without contrast.  I do want you to continue your Flonase and add Astelin nasal spray for about 3 days.  If your sinus pressure worsens or if your right ear pressure worsens then you could start doxycycline.  I sent that prescription to your pharmacy.  Work excuse for tomorrow.  Follow-up in 7 days or as needed.  How to Perform the Epley Maneuver The Epley maneuver is an exercise that relieves symptoms of vertigo. Vertigo is the feeling that you or your surroundings are moving when they are not. When you feel vertigo, you may feel like the room is spinning and have trouble walking. Dizziness is a little different than vertigo. When you are dizzy, you may feel unsteady or light-headed. You can do this maneuver at home whenever you have symptoms of vertigo. You can do it up to 3 times a day until your symptoms go away. Even though the Epley maneuver may relieve your vertigo for a few weeks, it is possible that your symptoms will return. This maneuver relieves vertigo, but it does not relieve dizziness. What are the risks? If it is done correctly, the Epley maneuver is considered safe. Sometimes it can lead to dizziness or nausea that goes away after a short time. If you develop other symptoms, such as changes in vision, weakness, or numbness, stop doing the maneuver and call your health care provider. How to perform the Epley maneuver 1. Sit on the edge of a bed or table with your back straight and your  legs extended or hanging over the edge of the bed or table. 2. Turn your head halfway toward the affected ear or side. 3. Lie backward quickly with your head turned until you are lying flat on your back. You may want to position a pillow under your shoulders. 4. Hold this position for 30 seconds. You may experience an attack of vertigo. This is normal. 5. Turn your head to the opposite direction until your unaffected ear is facing the floor. 6. Hold this position for 30 seconds. You may experience an attack of vertigo. This is normal. Hold this position until the vertigo stops. 7. Turn your whole body to the same side as your head. Hold for another 30 seconds. 8. Sit back up. You can repeat this exercise up to 3 times a day. Follow these instructions at home:  After doing the Epley maneuver, you can return to your normal activities.  Ask your health care provider if there is anything you should do at home to prevent vertigo. He or she may recommend that you: ? Keep your head raised (elevated) with two or more pillows while you sleep. ? Do not sleep on the side of your affected ear. ? Get up slowly from bed. ? Avoid sudden movements during the day. ? Avoid extreme head movement, like looking up or bending over. Contact a health care provider if:  Your vertigo gets worse.  You have other  symptoms, including: ? Nausea. ? Vomiting. ? Headache. Get help right away if:  You have vision changes.  You have a severe or worsening headache or neck pain.  You cannot stop vomiting.  You have new numbness or weakness in any part of your body. Summary  Vertigo is the feeling that you or your surroundings are moving when they are not.  The Epley maneuver is an exercise that relieves symptoms of vertigo.  If the Epley maneuver is done correctly, it is considered safe. You can do it up to 3 times a day. This information is not intended to replace advice given to you by your health care  provider. Make sure you discuss any questions you have with your health care provider. Document Released: 02/05/2013 Document Revised: 12/22/2015 Document Reviewed: 12/22/2015 Elsevier Interactive Patient Education  2017 [employer].

## 2017-09-14 NOTE — Addendum Note (Signed)
Addended by: Addison Naegeli on: 09/14/2017 02:45 PM   Modules accepted: Level of Service, SmartSet

## 2017-09-15 ENCOUNTER — Other Ambulatory Visit (INDEPENDENT_AMBULATORY_CARE_PROVIDER_SITE_OTHER): Payer: BLUE CROSS/BLUE SHIELD

## 2017-09-15 DIAGNOSIS — R42 Dizziness and giddiness: Secondary | ICD-10-CM | POA: Diagnosis not present

## 2017-09-15 DIAGNOSIS — H938X1 Other specified disorders of right ear: Secondary | ICD-10-CM | POA: Diagnosis not present

## 2017-09-15 LAB — CBC WITH DIFFERENTIAL/PLATELET
BASOS PCT: 1.1 % (ref 0.0–3.0)
Basophils Absolute: 0.1 10*3/uL (ref 0.0–0.1)
EOS ABS: 0.2 10*3/uL (ref 0.0–0.7)
Eosinophils Relative: 3.8 % (ref 0.0–5.0)
HCT: 37.3 % (ref 36.0–46.0)
HEMOGLOBIN: 12.4 g/dL (ref 12.0–15.0)
Lymphocytes Relative: 30.8 % (ref 12.0–46.0)
Lymphs Abs: 1.5 10*3/uL (ref 0.7–4.0)
MCHC: 33.3 g/dL (ref 30.0–36.0)
MCV: 87.7 fl (ref 78.0–100.0)
MONO ABS: 0.5 10*3/uL (ref 0.1–1.0)
Monocytes Relative: 10.4 % (ref 3.0–12.0)
NEUTROS PCT: 53.9 % (ref 43.0–77.0)
Neutro Abs: 2.6 10*3/uL (ref 1.4–7.7)
PLATELETS: 250 10*3/uL (ref 150.0–400.0)
RBC: 4.26 Mil/uL (ref 3.87–5.11)
RDW: 14.2 % (ref 11.5–15.5)
WBC: 4.7 10*3/uL (ref 4.0–10.5)

## 2017-09-15 LAB — COMPREHENSIVE METABOLIC PANEL
ALBUMIN: 4 g/dL (ref 3.5–5.2)
ALT: 15 U/L (ref 0–35)
AST: 14 U/L (ref 0–37)
Alkaline Phosphatase: 87 U/L (ref 39–117)
BUN: 16 mg/dL (ref 6–23)
CHLORIDE: 108 meq/L (ref 96–112)
CO2: 28 meq/L (ref 19–32)
CREATININE: 0.54 mg/dL (ref 0.40–1.20)
Calcium: 8.9 mg/dL (ref 8.4–10.5)
GFR: 121.83 mL/min (ref >60.00)
GLUCOSE: 120 mg/dL — AB (ref 70–99)
Potassium: 4.2 mEq/L (ref 3.5–5.1)
SODIUM: 142 meq/L (ref 135–145)
Total Bilirubin: 0.4 mg/dL (ref 0.2–1.2)
Total Protein: 6.6 g/dL (ref 6.0–8.3)

## 2017-09-15 MED ORDER — CEFTRIAXONE SODIUM 1 G IJ SOLR
1.0000 g | Freq: Once | INTRAMUSCULAR | Status: AC
  Administered 2017-09-15: 1 g via INTRAMUSCULAR

## 2017-09-15 MED ORDER — MECLIZINE HCL 12.5 MG PO TABS: 12.5000 mg | ORAL_TABLET | Freq: Three times a day (TID) | ORAL | 0 refills | Status: DC | PRN

## 2017-09-15 NOTE — Addendum Note (Signed)
Addended by: Hinton Dyer on: 09/15/2017 11:56 AM   Modules accepted: Orders

## 2017-09-15 NOTE — Addendum Note (Signed)
Addended by: Caffie Pinto on: 09/15/2017 12:15 PM   Modules accepted: Orders

## 2017-09-15 NOTE — Addendum Note (Signed)
Addended by: Anabel Halon on: 09/15/2017 11:38 AM   Modules accepted: Orders

## 2017-09-18 ENCOUNTER — Other Ambulatory Visit (INDEPENDENT_AMBULATORY_CARE_PROVIDER_SITE_OTHER): Payer: BLUE CROSS/BLUE SHIELD

## 2017-09-18 DIAGNOSIS — R739 Hyperglycemia, unspecified: Secondary | ICD-10-CM

## 2017-09-18 LAB — HEMOGLOBIN A1C: HEMOGLOBIN A1C: 5.7 % (ref 4.6–6.5)

## 2017-10-04 ENCOUNTER — Encounter: Payer: Self-pay | Admitting: Family

## 2017-10-06 ENCOUNTER — Telehealth: Payer: Self-pay | Admitting: Medical

## 2017-10-06 MED ORDER — ALPRAZOLAM 0.5 MG PO TABS: 0.5000 mg | ORAL_TABLET | Freq: Two times a day (BID) | ORAL | 0 refills | Status: DC

## 2017-10-06 NOTE — Telephone Encounter (Signed)
Refilled pt xanax my chart refill request. Pt pcp not in office today so refilled since I was covering for pcp.

## 2017-10-06 NOTE — Telephone Encounter (Signed)
Please advise in PCP's absence?  Last alprazolam RX: 09/01/17, #60 Last OV: 08/15/17 w/PCP Next OV: 10/25/17 UDS: 09/08/17, moderate CSC: 09/08/17 CSR: No discrepancies identified

## 2017-10-25 ENCOUNTER — Ambulatory Visit: Payer: BLUE CROSS/BLUE SHIELD | Admitting: Family

## 2017-10-25 ENCOUNTER — Encounter: Payer: Self-pay | Admitting: Family

## 2017-10-25 VITALS — BP 123/77 | HR 86 | Temp 98.5°F | Resp 18 | Ht 64.0 in | Wt 243.4 lb

## 2017-10-25 DIAGNOSIS — E041 Nontoxic single thyroid nodule: Secondary | ICD-10-CM

## 2017-10-25 DIAGNOSIS — E559 Vitamin D deficiency, unspecified: Secondary | ICD-10-CM

## 2017-10-25 DIAGNOSIS — Z0184 Encounter for antibody response examination: Secondary | ICD-10-CM

## 2017-10-25 DIAGNOSIS — Z23 Encounter for immunization: Secondary | ICD-10-CM

## 2017-10-25 DIAGNOSIS — N3281 Overactive bladder: Secondary | ICD-10-CM

## 2017-10-25 DIAGNOSIS — K219 Gastro-esophageal reflux disease without esophagitis: Secondary | ICD-10-CM

## 2017-10-25 DIAGNOSIS — D509 Iron deficiency anemia, unspecified: Secondary | ICD-10-CM

## 2017-10-25 DIAGNOSIS — F419 Anxiety disorder, unspecified: Secondary | ICD-10-CM

## 2017-10-25 LAB — CBC WITH DIFFERENTIAL/PLATELET
Basophils Absolute: 0.1 10*3/uL (ref 0.0–0.1)
Basophils Relative: 1.4 % (ref 0.0–3.0)
EOS ABS: 0.2 10*3/uL (ref 0.0–0.7)
EOS PCT: 3.5 % (ref 0.0–5.0)
HCT: 38.1 % (ref 36.0–46.0)
HEMOGLOBIN: 12.7 g/dL (ref 12.0–15.0)
LYMPHS ABS: 1.3 10*3/uL (ref 0.7–4.0)
Lymphocytes Relative: 28.4 % (ref 12.0–46.0)
MCHC: 33.2 g/dL (ref 30.0–36.0)
MCV: 85.9 fl (ref 78.0–100.0)
MONO ABS: 0.5 10*3/uL (ref 0.1–1.0)
Monocytes Relative: 11.2 % (ref 3.0–12.0)
NEUTROS PCT: 55.5 % (ref 43.0–77.0)
Neutro Abs: 2.5 10*3/uL (ref 1.4–7.7)
Platelets: 267 10*3/uL (ref 150.0–400.0)
RBC: 4.44 Mil/uL (ref 3.87–5.11)
RDW: 13.9 % (ref 11.5–15.5)
WBC: 4.5 10*3/uL (ref 4.0–10.5)

## 2017-10-25 LAB — FERRITIN: FERRITIN: 156.4 ng/mL (ref 10.0–291.0)

## 2017-10-25 LAB — T3, FREE: T3 FREE: 2.9 pg/mL (ref 2.3–4.2)

## 2017-10-25 LAB — IRON: IRON: 46 ug/dL (ref 42–145)

## 2017-10-25 LAB — TSH: TSH: 0.72 u[IU]/mL (ref 0.35–4.50)

## 2017-10-25 MED ORDER — FLUCONAZOLE 150 MG PO TABS: ORAL_TABLET | ORAL | 0 refills | Status: DC

## 2017-10-25 MED ORDER — FLUTICASONE PROPIONATE 50 MCG/ACT NA SUSP: 2.0000 | Freq: Every day | NASAL | 6 refills | Status: DC

## 2017-10-25 NOTE — Progress Notes (Signed)
   Subjective:    Patient ID: [name] [name], female    DOB: 1955-07-19, 62 y.o.   MRN: [id number]  HPI  Patient is a 62 year old female who presents today for routine follow-up.  Anxiety- she is maintained on paxil  Xanax. She reports that she uses every night to go to sleep. Reports increased anxiety in the evenings.    Overactive bladder-she is maintained on Myrbetriq. Rx'd by Dr. Diona Fanti at [facility]. Uses elavil for bladder spasms as well.  GERD-continues omeprazole 40 mg once daily.reports that this is stable.   Vitamin D deficiency-she continues 50,000 units once weekly.  Allergic rhinitis-she continue Xyzal every evening. Flonase daily.  Reports some post nasal drip.   History of thyroid nodules-these have been stable over time her last thyroid ultrasound was performed in 2013.  Lab Results  Component Value Date   TSH 0.41 01/16/2017   Iron def anemia- on bid iron.  She reports that following recent antibiotic use she has developed vaginal itching and discharge.  Requests refill of Diflucan.  Review of Systems     Objective:   Physical Exam  Constitutional: She appears well-developed and well-nourished.  Neck: No thyromegaly present.  Cardiovascular: Normal rate, regular rhythm and normal heart sounds.  No murmur heard. Pulmonary/Chest: Effort normal and breath sounds normal. No respiratory distress. She has no wheezes.  Lymphadenopathy:    She has no cervical adenopathy.  Psychiatric: She has a normal mood and affect. Her behavior is normal. Judgment and thought content normal.          Assessment & Plan:  Thyroid nodules-will obtain follow-up thyroid function testing as well as a follow-up thyroid ultrasound.  GERD-stable on PPI, continue same.  Vaginal candidiasis- Rx sent for Diflucan refill.  Vitamin D deficiency-continues weekly vitamin D supplementation.  Will obtain follow-up vitamin D level.  Iron deficiency anemia- clinically stable  obtain CBC, serum iron, and ferritin.  Overactive bladder-clinically stable being managed by urology, Dr. Diona Fanti.  Anxiety-clinically stable on current medications. Allergic rhinitis-clinically stable, continue current medications.  Flu shot today.

## 2017-10-25 NOTE — Patient Instructions (Signed)
Please complete lab work prior to leaving.   

## 2017-10-25 NOTE — Addendum Note (Signed)
Addended by: Kelle Darting A on: 10/25/2017 09:00 AM   Modules accepted: Orders

## 2017-10-30 ENCOUNTER — Telehealth: Payer: Self-pay | Admitting: Family

## 2017-10-30 DIAGNOSIS — E559 Vitamin D deficiency, unspecified: Secondary | ICD-10-CM

## 2017-10-30 LAB — VITAMIN D 1,25 DIHYDROXY
VITAMIN D2 1, 25 (OH): 106 pg/mL
Vitamin D 1, 25 (OH)2 Total: 148 pg/mL — ABNORMAL HIGH (ref 18–72)
Vitamin D3 1, 25 (OH)2: 42 pg/mL

## 2017-10-30 LAB — T4: T4 TOTAL: 6.4 ug/dL (ref 5.1–11.9)

## 2017-10-30 LAB — MEASLES/MUMPS/RUBELLA IMMUNITY
Mumps IgG: 186 AU/mL
Rubella: 5.93 index
Rubeola IgG: 119 AU/mL

## 2017-10-30 NOTE — Telephone Encounter (Signed)
Attempted to reach pt and left detailed message on voicemail regarding below and to call back to schedule lab appt in 1 month. Future order entered. [place] for [facility] / triage to discuss with pt.

## 2017-10-30 NOTE — Telephone Encounter (Signed)
Vit D level is too high.  Stop vit d.  Repeat vit D level in 1 month. Dx vit d deficiency.

## 2017-10-31 ENCOUNTER — Telehealth: Payer: Self-pay | Admitting: Family

## 2017-10-31 ENCOUNTER — Ambulatory Visit (HOSPITAL_BASED_OUTPATIENT_CLINIC_OR_DEPARTMENT_OTHER)
Admission: RE | Admit: 2017-10-31 | Discharge: 2017-10-31 | Disposition: A | Discharge status: Home / Self Care | Payer: BLUE CROSS/BLUE SHIELD | Source: Ambulatory Visit | Attending: Family | Admitting: Family

## 2017-10-31 DIAGNOSIS — E041 Nontoxic single thyroid nodule: Secondary | ICD-10-CM | POA: Insufficient documentation

## 2017-10-31 NOTE — Telephone Encounter (Signed)
Please contact pt and let her know that one of her thyroid nodules has changed and radiologist is recommending biopsy. This nodule has not previously been biopsied.  I have placed referral for thyroid biopsy at [facility]. I would like her to let me know if she has not been contacted by them to schedule in 1 week.

## 2017-11-01 NOTE — Telephone Encounter (Signed)
Left detailed message on pt's voicemail and to call if any questions or not contacted within 1 week regarding appt.

## 2017-11-07 ENCOUNTER — Other Ambulatory Visit: Payer: Self-pay | Admitting: Medical

## 2017-11-07 ENCOUNTER — Encounter: Payer: Self-pay | Admitting: Family

## 2017-11-07 MED ORDER — ALPRAZOLAM 0.5 MG PO TABS: 0.5000 mg | ORAL_TABLET | Freq: Two times a day (BID) | ORAL | 0 refills | Status: DC

## 2017-11-07 NOTE — Telephone Encounter (Signed)
Pt is requesting refill on alprazolam.   Last OV: 10/25/2017 Last Fill: 10/06/2017 #60 and 0RF UDS: 09/08/2017 Moderate risk

## 2017-11-09 ENCOUNTER — Telehealth: Payer: Self-pay | Admitting: Family

## 2017-11-09 NOTE — Telephone Encounter (Signed)
Erroneous encounter

## 2017-11-15 ENCOUNTER — Other Ambulatory Visit: Payer: BLUE CROSS/BLUE SHIELD

## 2017-11-21 ENCOUNTER — Ambulatory Visit
Admission: RE | Admit: 2017-11-21 | Discharge: 2017-11-21 | Disposition: A | Discharge status: Home / Self Care | Payer: BLUE CROSS/BLUE SHIELD | Source: Ambulatory Visit | Attending: Family | Admitting: Family

## 2017-11-21 ENCOUNTER — Other Ambulatory Visit (HOSPITAL_COMMUNITY)
Admission: RE | Admit: 2017-11-21 | Discharge: 2017-11-21 | Disposition: A | Discharge status: Home / Self Care | Payer: BLUE CROSS/BLUE SHIELD | Source: Ambulatory Visit | Attending: Radiology | Admitting: Radiology

## 2017-11-21 DIAGNOSIS — E041 Nontoxic single thyroid nodule: Secondary | ICD-10-CM | POA: Diagnosis not present

## 2017-11-23 ENCOUNTER — Encounter: Payer: Self-pay | Admitting: Family

## 2017-11-23 DIAGNOSIS — B9620 Unspecified Escherichia coli [E. coli] as the cause of diseases classified elsewhere: Secondary | ICD-10-CM | POA: Diagnosis not present

## 2017-11-23 DIAGNOSIS — N390 Urinary tract infection, site not specified: Secondary | ICD-10-CM | POA: Diagnosis not present

## 2017-11-23 DIAGNOSIS — N3281 Overactive bladder: Secondary | ICD-10-CM | POA: Diagnosis not present

## 2017-11-28 ENCOUNTER — Other Ambulatory Visit: Payer: Self-pay | Admitting: Family

## 2017-12-05 DIAGNOSIS — N3010 Interstitial cystitis (chronic) without hematuria: Secondary | ICD-10-CM | POA: Diagnosis not present

## 2017-12-07 ENCOUNTER — Other Ambulatory Visit: Payer: Self-pay | Admitting: Family

## 2017-12-08 NOTE — Telephone Encounter (Signed)
Last alprazolam RX: 11/07/17, #60 Last OV: 10/25/17 Next OV: Due 04/25/18 but not yet scheduled UDS: 09/08/17, moderate CSC: 09/08/17 CSR: No discrepancies identified

## 2017-12-14 ENCOUNTER — Other Ambulatory Visit: Payer: Self-pay | Admitting: Family

## 2017-12-21 ENCOUNTER — Encounter: Payer: Self-pay | Admitting: Medical

## 2017-12-21 ENCOUNTER — Ambulatory Visit: Payer: BLUE CROSS/BLUE SHIELD | Admitting: Medical

## 2017-12-21 VITALS — BP 143/94 | HR 84 | Temp 98.2°F | Resp 16 | Ht 64.0 in | Wt 240.5 lb

## 2017-12-21 DIAGNOSIS — R7989 Other specified abnormal findings of blood chemistry: Secondary | ICD-10-CM

## 2017-12-21 DIAGNOSIS — B009 Herpesviral infection, unspecified: Secondary | ICD-10-CM | POA: Diagnosis not present

## 2017-12-21 MED ORDER — VALACYCLOVIR HCL 1 G PO TABS: ORAL_TABLET | ORAL | 0 refills | Status: DC

## 2017-12-21 MED ORDER — VALACYCLOVIR HCL 1 G PO TABS: 500.0000 mg | ORAL_TABLET | Freq: Every day | ORAL | 11 refills | Status: DC

## 2017-12-21 NOTE — Patient Instructions (Addendum)
For recent type II herpes outbreak, I did clarify with the pharmacist regarding high level dosing and he stated he has seen high-level dosing for 2 days at onset of treatment.  So I am prescribing 1 g to take 2 tablets twice daily for 2 days then take 1 tablet twice daily for 8 days.  Also I am providing another prescription for preventive treatment which would be 500 mg Valtrex daily.   For history of low vitamin D can get repeat vitamin D level today.  Follow-up as regular scheduled with PCP or as needed.

## 2017-12-21 NOTE — Progress Notes (Signed)
Subjective:    Patient ID: [name] [name], female    DOB: 1955/11/12, 62 y.o.   MRN: [id number]  HPI Pt in for refill of antiviral medication. Pt had hsv2 since 62 yo. Pt states no severe flare for 10 years. Pt states gyn retired. Pt states in past would treat flares early on. But recently she just got recurrent flare. Described vaginal irritation and convinced herpes outbreak as she has had before and expresses knows how outbreak feels.  Also gets cold sore on mouth occasionally.  She wants high dose valtrex as her gyn used to prescribe in high manner.  Pt has history of low vit D but then recent level was high.      Review of Systems  Constitutional: Negative for chills, fatigue and fever.  Respiratory: Negative for cough, chest tightness, shortness of breath and wheezing.   Genitourinary: Positive for vaginal pain. Negative for dyspareunia, dysuria, flank pain, hematuria, pelvic pain and vaginal discharge.       Vaginal region pain. See hpi.  Neurological: Negative for dizziness and headaches.  Hematological: Negative for adenopathy. Does not bruise/bleed easily.    Past Medical History:  Diagnosis Date  . Allergy   . Anemia    takes iron supplement  . Anxiety   . Arthritis   . 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 Elevated alkaline phosphatase level    negative workup (presumed secondary to fatty liver)  . Frequency-urgency syndrome   . GERD (gastroesophageal reflux disease)   . Interstitial cystitis    Dr Vernie Shanks  . Neuromuscular disorder (HCC)    rt arm carpal tunnel syndrome  . Obesity    status post bariatric procedure in [facility] 2005  . Osteoporosis   . Thyroid nodule 7/10   `  . Thyroid nodule   . Varicose vein of leg      Social History   Socioeconomic History  . Marital status: Divorced    Spouse name: Not on file  . Number of children: 1  . Years of education: Not on file  . Highest education level: Not on file  Occupational History  .  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[employer]  . Financial resource strain: Not on file  . Food insecurity:    Worry: Not on file    Inability: Not on file  . Transportation needs:    Medical: Not on file    Non-medical: Not on file  Tobacco Use  . Smoking status: Never Smoker  . Smokeless tobacco: Never Used  Substance and Sexual Activity  . Alcohol use: Yes    Comment: RARE  . Drug use: No  . Sexual activity: Yes    Comment: 1st intercourse 62 yo-More than 5 partners  Lifestyle  . Physical activity:    Days per week: Not on file    Minutes per session: Not on file  . Stress: Not on file  Relationships  . Social connections:    Talks on phone: Not on file    Gets together: Not on file    Attends religious service: Not on file    Active member of club or organization: Not on file    Attends meetings of clubs or organizations: Not on file    Relationship status: Not on file  . Intimate partner violence:    Fear of current or ex partner: Not on file    Emotionally abused: Not on file  Physically abused: Not on file    Forced sexual activity: Not on file  Other Topics Concern  . Not on file  Social History Narrative  . Not on file    Past Surgical History:  Procedure Laterality Date  . ANTERIOR CERVICAL DECOMP/DISCECTOMY FUSION N/A 06/21/2012   Procedure: ANTERIOR CERVICAL DECOMPRESSION/DISCECTOMY FUSION C-4 - C5 (SPACER/DePUY CERVICAL PLATE ONLY) 1 LEVEL;  Surgeon: Melina Schools, MD;  Location: [place] [facility];  Service: Orthopedics;  Laterality: N/A;  . BLADDER SURGERY  02/21/12 and 02/28/12   Dr Diona Fanti  . COLPOSCOPY  1980  . CRYOTHERAPY  1980  . CYSTO WITH HYDRODISTENSION  12/12/2011   Procedure: CYSTOSCOPY/HYDRODISTENSION;  Surgeon: Franchot Gallo, MD;  Location[facility];  Service: Urology;  Laterality: N/A;  30 MIN   . EYE SURGERY  1997   bilateral cataract extraction  . FOOT SURGERY     bilateral bunionettes  . FOOT  SURGERY Right 01/07/14   Pt states surgery was due to arthritis. "Has pins in place now"  . GASTRIC BYPASS  2005  . JOINT REPLACEMENT  2008   left total knee  . KNEE SURGERY  1997   left knee  . NASAL SINUS SURGERY  8127   Dr Erik Obey  . REFRACTIVE SURGERY  2006   lasik  . SHOULDER SURGERY  2 2012   RIGHT   . STOMACH SURGERY  2007   "tummy tuck"  . TOTAL KNEE ARTHROPLASTY Right 12/31/2015   Procedure: RIGHT TOTAL KNEE ARTHROPLASTY;  Surgeon: [name], MD;  Location: [facility];  Service: Orthopedics;  Laterality: Right;  Adductor Block  . VARICOSE VEIN SURGERY Right 02/2012   leg    Family History  Problem Relation Age of Onset  . Diabetes Paternal Grandfather   . Stroke Paternal Grandfather   . Hyperlipidemia Mother   . Hypertension Mother   . Hypertension Father   . Diabetes Father   . Prostate cancer Father   . Dementia Father   . Heart disease Maternal Grandmother   . Hypertension Maternal Grandmother   . Heart disease Maternal Grandfather   . Hypertension Maternal Grandfather   . Stroke Maternal Grandfather   . Colon cancer Neg Hx   . Esophageal cancer Neg Hx   . Pancreatic cancer Neg Hx   . Stomach cancer Neg Hx     Allergies  Allergen Reactions  . 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  No allergy.  . Nitrofurantoin     REACTION: fever    Current Outpatient Medications on File Prior to Visit  Medication Sig Dispense Refill  . acetaminophen (TYLENOL) 650 MG CR tablet Take 2 tablets (1,300 mg total) by mouth every 8 (eight) hours as needed for pain. Take for mild pain. Do not combine with Percocet. Do not exceed daily recommended limit of Tylenol.    . ALPRAZolam (XANAX) 0.5 MG tablet TAKE 1 TABLET (0.5 MG TOTAL) BY MOUTH 2 (TWO) TIMES DAILY. 60 tablet 0  . amitriptyline (ELAVIL) 50 MG tablet TAKE 1 TABLET BY MOUTH EVERY EVENING  3  . BIOTIN PO Take by mouth.    . Calcium Carbonate Antacid (TUMS ULTRA 1000 PO) Take 1,000  mg by mouth 2 (two) times daily.    . ferrous sulfate 325 (65 FE) MG tablet TAKE 1 TABLET BY MOUTH 2 TIMES A DAY 30 tablet 2  . fluticasone (FLONASE) 50 MCG/ACT nasal spray Place 2 sprays into both nostrils daily. [place]  g 6  . levocetirizine (XYZAL) 5 MG tablet Take 1 tablet (5 mg total) by mouth every evening. 30 tablet 5  . Lifitegrast (XIIDRA) 5 % SOLN Xiidra 5 % eye drops in a dropperette  INSTILL 1 DROP INTO BOTH EYES TWICE A DAY AS DIRECTED    . Multiple Vitamin (MULTIVITAMIN) tablet Take 1 tablet 2 (two) times daily by mouth.    . multivitamin-lutein (OCUVITE-LUTEIN) CAPS capsule Take 1 capsule daily by mouth.    Marland Kitchen MYRBETRIQ 50 MG TB24 tablet Take 50 mg by mouth every evening.   2  . Omega-3 Fatty Acids (FISH OIL PO) Take 2 (two) times daily by mouth.    Marland Kitchen omeprazole (PRILOSEC) 40 MG capsule TAKE 1 CAPSULE BY MOUTH EVERY DAY 90 capsule 1  . OVER THE COUNTER MEDICATION Place 1 drop into both eyes 2 (two) times daily. Allergy eye drops    . PARoxetine (PAXIL) 30 MG tablet TAKE 1 TABLET BY MOUTH EVERY MORNING 90 tablet 1  . VITAMIN A PO Take by mouth.    . fluconazole (DIFLUCAN) 150 MG tablet Take 1 tab at start of yeast infection, may repeat in 3 days if symptoms are not improved. (Patient not taking: Reported on 12/21/2017) 2 tablet 0  . Vitamin D, Ergocalciferol, (DRISDOL) 50000 units CAPS capsule TAKE 1 CAPSULE (50,000 UNITS TOTAL) BY MOUTH 2 (TWO) TIMES A WEEK. (Patient not taking: Reported on 12/21/2017) 24 capsule 1   No current facility-administered medications on file prior to visit.     BP (!) 143/94 (BP Location: Right Arm, Patient Position: Sitting, Cuff Size: Normal)   Pulse 84   Temp 98.2 F (36.8 C) (Oral)   Resp 16   Ht 5\' 4"  (1.626 m)   Wt 240 lb 8 oz (109.1 kg)   SpO2 96%   BMI 41.28 kg/m       Objective:   Physical Exam  General- No acute distress. Pleasant patient. Neck- Full range of motion, no jvd Lungs- Clear, even and unlabored. Heart- regular rate and  rhythm. Neurologic- CNII- XII grossly intact.  Genital- deferred.      Assessment & Plan:  For recent type II herpes outbreak, I did clarify with the pharmacist regarding high level dosing and he stated he has seen high-level dosing for 2days at onset of treatment.  So I am prescribing 1 g to take 2 tablets twice daily for 2 days then take 1 tablet twice daily for 8 days.  Also I am providing another prescription for preventive treatment which would be 500 mg Valtrex daily.   For history of low vitamin D can get repeat vitamin D level today.  Follow-up as regular scheduled with PCP or as needed.  Mackie Pai, PA-C

## 2017-12-22 LAB — VITAMIN D 25 HYDROXY (VIT D DEFICIENCY, FRACTURES): VITD: 28.89 ng/mL — AB (ref 30.00–100.00)

## 2018-01-09 DIAGNOSIS — H16223 Keratoconjunctivitis sicca, not specified as Sjogren's, bilateral: Secondary | ICD-10-CM | POA: Diagnosis not present

## 2018-01-10 ENCOUNTER — Encounter: Payer: Self-pay | Admitting: Family

## 2018-01-10 MED ORDER — ALPRAZOLAM 0.5 MG PO TABS: 0.5000 mg | ORAL_TABLET | Freq: Two times a day (BID) | ORAL | 0 refills | Status: DC

## 2018-01-22 ENCOUNTER — Encounter: Payer: Self-pay | Admitting: Family

## 2018-01-22 ENCOUNTER — Telehealth: Payer: Self-pay

## 2018-01-22 ENCOUNTER — Ambulatory Visit: Payer: BLUE CROSS/BLUE SHIELD | Admitting: Family

## 2018-01-22 VITALS — BP 138/74 | HR 84 | Temp 98.7°F | Resp 16 | Ht 64.0 in | Wt 241.0 lb

## 2018-01-22 DIAGNOSIS — J0100 Acute maxillary sinusitis, unspecified: Secondary | ICD-10-CM

## 2018-01-22 MED ORDER — AMOXICILLIN-POT CLAVULANATE 875-125 MG PO TABS: 1.0000 | ORAL_TABLET | Freq: Two times a day (BID) | ORAL | 0 refills | Status: DC

## 2018-01-22 MED ORDER — FLUCONAZOLE 150 MG PO TABS: ORAL_TABLET | ORAL | 0 refills | Status: DC

## 2018-01-22 NOTE — Progress Notes (Signed)
Subjective:    Patient ID: [name] [name], female    DOB: 1955/11/17, 62 y.o.   MRN: [id number]  HPI  Patient is a 62 yr old female who presents today with chief complaint of facial pain. Reports that she developed allergy symptoms with sneezing/drainage.  She has been using sudafed.  Reports + pain in her cheeks.  Reports that she has been very busy with work and family and work obligations.  She denies fever, nausea/vomitting. Not able to blow much out of her nose.  Occasionally yellow/green/scabbing discharge from her nose.  Review of Systems See HPI  Past Medical History:  Diagnosis Date  . Allergy   . Anemia    takes iron supplement  . Anxiety   . Arthritis   . 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 Elevated alkaline phosphatase level    negative workup (presumed secondary to fatty liver)  . Frequency-urgency syndrome   . GERD (gastroesophageal reflux disease)   . Interstitial cystitis    Dr Vernie Shanks  . Neuromuscular disorder (HCC)    rt arm carpal tunnel syndrome  . Obesity    status post bariatric procedure in [facility] 2005  . Osteoporosis   . Thyroid nodule 7/10   `  . Thyroid nodule   . Varicose vein of leg      Social History   Socioeconomic History  . Marital status: Divorced    Spouse name: Not on file  . Number of children: 1  . Years of education: Not on file  . Highest education level: Not on file  Occupational History  . 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[employer]  . Financial resource strain: Not on file  . Food insecurity:    Worry: Not on file    Inability: Not on file  . Transportation needs:    Medical: Not on file    Non-medical: Not on file  Tobacco Use  . Smoking status: Never Smoker  . Smokeless tobacco: Never Used  Substance and Sexual Activity  . Alcohol use: Yes    Comment: RARE  . Drug use: No  . Sexual activity: Yes    Comment: 1st intercourse 62 yo-More than 5 partners  Lifestyle  . Physical  activity:    Days per week: Not on file    Minutes per session: Not on file  . Stress: Not on file  Relationships  . Social connections:    Talks on phone: Not on file    Gets together: Not on file    Attends religious service: Not on file    Active member of club or organization: Not on file    Attends meetings of clubs or organizations: Not on file    Relationship status: Not on file  . Intimate partner violence:    Fear of current or ex partner: Not on file    Emotionally abused: Not on file    Physically abused: Not on file    Forced sexual activity: Not on file  Other Topics Concern  . Not on file  Social History Narrative  . Not on file    Past Surgical History:  Procedure Laterality Date  . ANTERIOR CERVICAL DECOMP/DISCECTOMY FUSION N/A 06/21/2012   Procedure: ANTERIOR CERVICAL DECOMPRESSION/DISCECTOMY FUSION C-4 - C5 (SPACER/DePUY CERVICAL PLATE ONLY) 1 LEVEL;  Surgeon: Melina Schools, MD;  Location: [place];  Service: Orthopedics;  Laterality: N/A;  . BLADDER SURGERY  02/21/12 and 02/28/12   Dr  Dahlstedt  . COLPOSCOPY  1980  . CRYOTHERAPY  1980  . CYSTO WITH HYDRODISTENSION  12/12/2011   Procedure: CYSTOSCOPY/HYDRODISTENSION;  Surgeon: Franchot Gallo, MD;  Location: [facility];  Service: Urology;  Laterality: N/A;  30 MIN   . EYE SURGERY  1997   bilateral cataract extraction  . FOOT SURGERY     bilateral bunionettes  . FOOT SURGERY Right 01/07/14   Pt states surgery was due to arthritis. "Has pins in place now"  . GASTRIC BYPASS  2005  . JOINT REPLACEMENT  2008   left total knee  . KNEE SURGERY  1997   left knee  . NASAL SINUS SURGERY  6387   Dr Erik Obey  . REFRACTIVE SURGERY  2006   lasik  . SHOULDER SURGERY  2 2012   RIGHT   . STOMACH SURGERY  2007   "tummy tuck"  . TOTAL KNEE ARTHROPLASTY Right 12/31/2015   Procedure: RIGHT TOTAL KNEE ARTHROPLASTY;  Surgeon: [name], MD;  Location: [facility];  Service: Orthopedics;  Laterality: Right;   Adductor Block  . VARICOSE VEIN SURGERY Right 02/2012   leg    Family History  Problem Relation Age of Onset  . Diabetes Paternal Grandfather   . Stroke Paternal Grandfather   . Hyperlipidemia Mother   . Hypertension Mother   . Hypertension Father   . Diabetes Father   . Prostate cancer Father   . Dementia Father   . Heart disease Maternal Grandmother   . Hypertension Maternal Grandmother   . Heart disease Maternal Grandfather   . Hypertension Maternal Grandfather   . Stroke Maternal Grandfather   . Colon cancer Neg Hx   . Esophageal cancer Neg Hx   . Pancreatic cancer Neg Hx   . Stomach cancer Neg Hx     Allergies  Allergen Reactions  . 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  No allergy.  . Nitrofurantoin     REACTION: fever    Current Outpatient Medications on File Prior to Visit  Medication Sig Dispense Refill  . acetaminophen (TYLENOL) 650 MG CR tablet Take 2 tablets (1,300 mg total) by mouth every 8 (eight) hours as needed for pain. Take for mild pain. Do not combine with Percocet. Do not exceed daily recommended limit of Tylenol.    . ALPRAZolam (XANAX) 0.5 MG tablet Take 1 tablet (0.5 mg total) by mouth 2 (two) times daily. 60 tablet 0  . amitriptyline (ELAVIL) 50 MG tablet TAKE 1 TABLET BY MOUTH EVERY EVENING  3  . BIOTIN PO Take by mouth.    . Calcium Carbonate Antacid (TUMS ULTRA 1000 PO) Take 1,000 mg by mouth 2 (two) times daily.    . ferrous sulfate 325 (65 FE) MG tablet TAKE 1 TABLET BY MOUTH 2 TIMES A DAY 30 tablet 2  . Fexofenadine HCl (ALLEGRA PO) Take by mouth.    . fluticasone (FLONASE) 50 MCG/ACT nasal spray Place 2 sprays into both nostrils daily. 16 g 6  . Lifitegrast (XIIDRA) 5 % SOLN Xiidra 5 % eye drops in a dropperette  INSTILL 1 DROP INTO BOTH EYES TWICE A DAY AS DIRECTED    . Multiple Vitamin (MULTIVITAMIN) tablet Take 1 tablet 2 (two) times daily by mouth.    . multivitamin-lutein (OCUVITE-LUTEIN) CAPS  capsule Take 1 capsule daily by mouth.    Marland Kitchen MYRBETRIQ 50 MG TB24 tablet Take 50 mg by mouth every evening.   2  .  Omega-3 Fatty Acids (FISH OIL PO) Take 2 (two) times daily by mouth.    Marland Kitchen omeprazole (PRILOSEC) 40 MG capsule TAKE 1 CAPSULE BY MOUTH EVERY DAY 90 capsule 1  . OVER THE COUNTER MEDICATION Place 1 drop into both eyes 2 (two) times daily. Allergy eye drops    . PARoxetine (PAXIL) 30 MG tablet TAKE 1 TABLET BY MOUTH EVERY MORNING 90 tablet 1  . valACYclovir (VALTREX) 1000 MG tablet 2 tab po bid x 2 days. Then 1 tab po bid x 8 days. 24 tablet 0  . valACYclovir (VALTREX) 1000 MG tablet Take 0.5 tablets (500 mg total) by mouth daily. 30 tablet 11  . VITAMIN A PO Take by mouth.    . Vitamin D, Ergocalciferol, (DRISDOL) 50000 units CAPS capsule TAKE 1 CAPSULE (50,000 UNITS TOTAL) BY MOUTH 2 (TWO) TIMES A WEEK. 24 capsule 1   No current facility-administered medications on file prior to visit.     BP 138/74 (BP Location: Right Arm, Patient Position: Sitting, Cuff Size: Large)   Pulse 84   Temp 98.7 F (37.1 C) (Oral)   Resp 16   Ht 5\' 4"  (1.626 m)   Wt 241 lb (109.3 kg)   SpO2 98%   BMI 41.37 kg/m       Objective:   Physical Exam  Constitutional: She is oriented to person, place, and time. She appears well-developed and well-nourished.  HENT:  Head: Normocephalic and atraumatic.  Right Ear: Tympanic membrane and ear canal normal.  Left Ear: Tympanic membrane and ear canal normal.  Nose: Right sinus exhibits maxillary sinus tenderness. Right sinus exhibits no frontal sinus tenderness. Left sinus exhibits maxillary sinus tenderness. Left sinus exhibits no frontal sinus tenderness.  Mild oropharyngeal erythema  Eyes: Pupils are equal, round, and reactive to light.  Neck: Neck supple. No thyromegaly present.  Cardiovascular: Normal rate, regular rhythm and normal heart sounds.  No murmur heard. Pulmonary/Chest: Effort normal and breath sounds normal. No respiratory distress.  She has no wheezes.  Neurological: She is alert and oriented to person, place, and time.  Skin: Skin is warm and dry.  Psychiatric: She has a normal mood and affect. Her behavior is normal. Judgment and thought content normal.          Assessment & Plan:  Sinusitis- will rx with augmentin. She requests rx for diflucan prn in case of yeast infection.  She is advised to continue flonase. Pt is advised to call if symptoms worsen or if not improved in 3-4 days.

## 2018-01-22 NOTE — Patient Instructions (Signed)
Please begin augmentin for sinus infection. Continue flonase. Call if symptoms worsen or if not improved in 3-4 days.

## 2018-01-22 NOTE — Telephone Encounter (Signed)
Noted  

## 2018-01-22 NOTE — Telephone Encounter (Signed)
Author phoned pt. to notify of 6PM opening with Melissa for sinus issues. Pt. Stated she would be there.

## 2018-01-25 ENCOUNTER — Ambulatory Visit: Payer: BLUE CROSS/BLUE SHIELD | Admitting: Medical

## 2018-02-09 ENCOUNTER — Encounter: Payer: Self-pay | Admitting: Family

## 2018-02-12 MED ORDER — ALPRAZOLAM 0.5 MG PO TABS: 0.5000 mg | ORAL_TABLET | Freq: Two times a day (BID) | ORAL | 1 refills | Status: DC

## 2018-02-16 ENCOUNTER — Encounter: Payer: Self-pay | Admitting: Family

## 2018-02-16 ENCOUNTER — Ambulatory Visit: Payer: BLUE CROSS/BLUE SHIELD | Admitting: Family

## 2018-02-16 ENCOUNTER — Ambulatory Visit: Payer: Self-pay | Admitting: *Deleted

## 2018-02-16 VITALS — BP 125/72 | HR 90 | Temp 98.7°F | Resp 16 | Ht 64.0 in | Wt 237.0 lb

## 2018-02-16 DIAGNOSIS — J329 Chronic sinusitis, unspecified: Secondary | ICD-10-CM | POA: Diagnosis not present

## 2018-02-16 MED ORDER — AMOXICILLIN-POT CLAVULANATE 875-125 MG PO TABS: 1.0000 | ORAL_TABLET | Freq: Two times a day (BID) | ORAL | 0 refills | Status: DC

## 2018-02-16 MED ORDER — FLUCONAZOLE 150 MG PO TABS: ORAL_TABLET | ORAL | 0 refills | Status: DC

## 2018-02-16 MED ORDER — PREDNISONE 10 MG PO TABS: ORAL_TABLET | ORAL | 0 refills | Status: DC

## 2018-02-16 NOTE — Progress Notes (Signed)
Subjective:    Patient ID: [name] [name], female    DOB: [birth year], 63 y.o.   MRN: [id number]  HPI  Facial - felt better on augmentin for a few weeks. Notes that over the last few days she began getting a HA, pressure in her right cheek, right ear fullness. Has mild dizziness that she tends to get when she has sinusitis.  Energy is low.  Denies associated fever but has felt cold.    Review of Systems See HPI  Past Medical History:  Diagnosis Date  . Allergy   . Anemia    takes iron supplement  . Anxiety   . Arthritis   . 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 Elevated alkaline phosphatase level    negative workup (presumed secondary to fatty liver)  . Frequency-urgency syndrome   . GERD (gastroesophageal reflux disease)   . Interstitial cystitis    Dr Vernie Shanks  . Neuromuscular disorder (HCC)    rt arm carpal tunnel syndrome  . Obesity    status post bariatric procedure in [facility] 2005  . Osteoporosis   . Thyroid nodule 7/10   `  . Thyroid nodule   . Varicose vein of leg      Social History   Socioeconomic History  . Marital status: Divorced    Spouse name: Not on file  . Number of children: 1  . Years of education: Not on file  . Highest education level: Not on file  Occupational History  . 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 Financial resource strain: Not on file  . Food insecurity:    Worry: Not on file    Inability: Not on file  . Transportation needs:    Medical: Not on file    Non-medical: Not on file  Tobacco Use  . Smoking status: Never Smoker  . Smokeless tobacco: Never Used  Substance and Sexual Activity  . Alcohol use: Yes    Comment: RARE  . Drug use: No  . Sexual activity: Yes    Comment: 1st intercourse 63 yo-More than 5 partners  Lifestyle  . Physical activity:    Days per week: Not on file    Minutes per session: Not on file  . Stress: Not on file  Relationships  . Social connections:   Talks on phone: Not on file    Gets together: Not on file    Attends religious service: Not on file    Active member of club or organization: Not on file    Attends meetings of clubs or organizations: Not on file    Relationship status: Not on file  . Intimate partner violence:    Fear of current or ex partner: Not on file    Emotionally abused: Not on file    Physically abused: Not on file    Forced sexual activity: Not on file  Other Topics Concern  . Not on file  Social History Narrative  . Not on file    Past Surgical History:  Procedure Laterality Date  . ANTERIOR CERVICAL DECOMP/DISCECTOMY FUSION N/A 06/21/2012   Procedure: ANTERIOR CERVICAL DECOMPRESSION/DISCECTOMY FUSION C-4 - C5 (SPACER/DePUY CERVICAL PLATE ONLY) 1 LEVEL;  Surgeon: Melina Schools, MD;  Location: [place];  Service: Orthopedics;  Laterality: N/A;  . BLADDER SURGERY  02/21/12 and 02/28/12   Dr Diona Fanti  . COLPOSCOPY  1980  . CRYOTHERAPY  1980  . CYSTO WITH HYDRODISTENSION  12/12/2011  Procedure: CYSTOSCOPY/HYDRODISTENSION;  Surgeon: Franchot Gallo, MD;  Location: [facility];  Service: Urology;  Laterality: N/A;  30 MIN   . EYE SURGERY  1997   bilateral cataract extraction  . FOOT SURGERY     bilateral bunionettes  . FOOT SURGERY Right 01/07/14   Pt states surgery was due to arthritis. "Has pins in place now"  . GASTRIC BYPASS  2005  . JOINT REPLACEMENT  2008   left total knee  . KNEE SURGERY  1997   left knee  . NASAL SINUS SURGERY  5361   Dr Erik Obey  . REFRACTIVE SURGERY  2006   lasik  . SHOULDER SURGERY  2 2012   RIGHT   . STOMACH SURGERY  2007   "tummy tuck"  . TOTAL KNEE ARTHROPLASTY Right 12/31/2015   Procedure: RIGHT TOTAL KNEE ARTHROPLASTY;  Surgeon: [name], MD;  Location: [facility];  Service: Orthopedics;  Laterality: Right;  Adductor Block  . VARICOSE VEIN SURGERY Right 02/2012   leg    Family History  Problem Relation Age of Onset  . Diabetes Paternal Grandfather    . Stroke Paternal Grandfather   . Hyperlipidemia Mother   . Hypertension Mother   . Hypertension Father   . Diabetes Father   . Prostate cancer Father   . Dementia Father   . Heart disease Maternal Grandmother   . Hypertension Maternal Grandmother   . Heart disease Maternal Grandfather   . Hypertension Maternal Grandfather   . Stroke Maternal Grandfather   . Colon cancer Neg Hx   . Esophageal cancer Neg Hx   . Pancreatic cancer Neg Hx   . Stomach cancer Neg Hx     Allergies  Allergen Reactions  . 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  No allergy.  . Nitrofurantoin     REACTION: fever    Current Outpatient Medications on File Prior to Visit  Medication Sig Dispense Refill  . acetaminophen (TYLENOL) 650 MG CR tablet Take 2 tablets (1,300 mg total) by mouth every 8 (eight) hours as needed for pain. Take for mild pain. Do not combine with Percocet. Do not exceed daily recommended limit of Tylenol.    . ALPRAZolam (XANAX) 0.5 MG tablet Take 1 tablet (0.5 mg total) by mouth 2 (two) times daily. 60 tablet 1  . amitriptyline (ELAVIL) 50 MG tablet TAKE 1 TABLET BY MOUTH EVERY EVENING  3  . BIOTIN PO Take by mouth.    . Calcium Carbonate Antacid (TUMS ULTRA 1000 PO) Take 1,000 mg by mouth 2 (two) times daily.    . ferrous sulfate 325 (65 FE) MG tablet TAKE 1 TABLET BY MOUTH 2 TIMES A DAY 30 tablet 2  . Fexofenadine HCl (ALLEGRA PO) Take by mouth.    . fluticasone (FLONASE) 50 MCG/ACT nasal spray Place 2 sprays into both nostrils daily. 16 g 6  . Lifitegrast (XIIDRA) 5 % SOLN Xiidra 5 % eye drops in a dropperette  INSTILL 1 DROP INTO BOTH EYES TWICE A DAY AS DIRECTED    . Multiple Vitamin (MULTIVITAMIN) tablet Take 1 tablet 2 (two) times daily by mouth.    . multivitamin-lutein (OCUVITE-LUTEIN) CAPS capsule Take 1 capsule daily by mouth.    Marland Kitchen MYRBETRIQ 50 MG TB24 tablet Take 50 mg by mouth every evening.   2  . Omega-3 Fatty Acids (FISH OIL PO)  Take 2 (two) times daily by mouth.    Marland Kitchen omeprazole (PRILOSEC) 40  MG capsule TAKE 1 CAPSULE BY MOUTH EVERY DAY 90 capsule 1  . OVER THE COUNTER MEDICATION Place 1 drop into both eyes 2 (two) times daily. Allergy eye drops    . PARoxetine (PAXIL) 30 MG tablet TAKE 1 TABLET BY MOUTH EVERY MORNING 90 tablet 1  . valACYclovir (VALTREX) 1000 MG tablet 2 tab po bid x 2 days. Then 1 tab po bid x 8 days. 24 tablet 0  . valACYclovir (VALTREX) 1000 MG tablet Take 0.5 tablets (500 mg total) by mouth daily. 30 tablet 11  . VITAMIN A PO Take by mouth.    . Vitamin D, Ergocalciferol, (DRISDOL) 50000 units CAPS capsule TAKE 1 CAPSULE (50,000 UNITS TOTAL) BY MOUTH 2 (TWO) TIMES A WEEK. 24 capsule 1   No current facility-administered medications on file prior to visit.     BP 125/72 (BP Location: Right Arm, Patient Position: Sitting, Cuff Size: Large)   Pulse 90   Temp 98.7 F (37.1 C) (Oral)   Resp 16   Ht 5\' 4"  (1.626 m)   Wt 237 lb (107.5 kg)   SpO2 99%   BMI 40.68 kg/m       Objective:   Physical Exam Constitutional:      Appearance: She is well-developed.  HENT:     Head: Normocephalic and atraumatic.     Right Ear: Ear canal normal.     Left Ear: Ear canal normal.     Ears:     Comments: Bilateral TM's retracted, no erythema noted    Nose:     Right Sinus: Maxillary sinus tenderness present. No frontal sinus tenderness.     Left Sinus: No maxillary sinus tenderness or frontal sinus tenderness.     Mouth/Throat:     Mouth: Mucous membranes are moist.     Pharynx: No oropharyngeal exudate or posterior oropharyngeal erythema.  Cardiovascular:     Rate and Rhythm: Normal rate and regular rhythm.     Heart sounds: Normal heart sounds. No murmur.  Pulmonary:     Effort: Pulmonary effort is normal. No respiratory distress.     Breath sounds: Normal breath sounds. No wheezing.  Psychiatric:        Behavior: Behavior normal.        Thought Content: Thought content normal.         Judgment: Judgment normal.           Assessment & Plan:  Recurrent sinusitis- pt is advised of plan as follows:  Continue flonase/astelin/allegra. Start augmentin x 14 days-you may use diflucan as needed. Begin prednisone taper. You should be contacted about your referral to ENT. Call symptoms worsen or if not improved in 3-4 days.

## 2018-02-16 NOTE — Telephone Encounter (Signed)
Patient is having dizziness due to sinus pressure- she feels she never fully recovered from her UPI. She would like to come in and be seen- but there is no opening at PCP. Patient is declining appointment at another office at this time. Will send note for review- and PCP response.  Additional Information . Commented on: [1] MILD dizziness (e.g., walking normally) AND [2] has been evaluated by physician for this    Patient states dizziness caused by sinus pressure- wants to see PCP only.  Answer Assessment - Initial Assessment Questions 1. DESCRIPTION: "Describe your dizziness."     Lightheaded due to sinus symptoms- pressure, not a lot of drainage at this time 2. LIGHTHEADED: "Do you feel lightheaded?" (e.g., somewhat faint, woozy, weak upon standing)     Dizzy when first stands up- common when she gets sinus congestion 3. VERTIGO: "Do you feel like either you or the room is spinning or tilting?" (i.e. vertigo)     no 4. SEVERITY: "How bad is it?"  "Do you feel like you are going to faint?" "Can you stand and walk?"   - MILD - walking normally   - MODERATE - interferes with normal activities (e.g., work, school)    - SEVERE - unable to stand, requires support to walk, feels like passing out now.      mild 5. ONSET:  "When did the dizziness begin?"     Patient was treated with antibiotics and she feels she didn't completely clear 6. AGGRAVATING FACTORS: "Does anything make it worse?" (e.g., standing, change in head position)     Movement of head,standing- she states she is not that bad- total relating to sinus 7. HEART RATE: "Can you tell me your heart rate?" "How many beats in 15 seconds?"  (Note: not all patients can do this)       Normal heart rate 8. CAUSE: "What do you think is causing the dizziness?"     Sinus pressure 9. RECURRENT SYMPTOM: "Have you had dizziness before?" If so, ask: "When was the last time?" "What happened that time?"     Sinus infection 10. OTHER SYMPTOMS: "Do you  have any other symptoms?" (e.g., fever, chest pain, vomiting, diarrhea, bleeding)       Cough, sinus pressure 11. PREGNANCY: "Is there any chance you are pregnant?" "When was your last menstrual period?"       n/a  Protocols used: DIZZINESS [facility]

## 2018-02-16 NOTE — Telephone Encounter (Signed)
Lets bring her in at 4pm today please.

## 2018-02-16 NOTE — Patient Instructions (Signed)
Continue flonase/astelin/allegra. Start augmentin x 14 days-you may use diflucan as needed. Begin prednisone taper. You should be contacted about your referral to ENT. Call symptoms worsen or if not improved in 3-4 days.

## 2018-02-16 NOTE — Telephone Encounter (Signed)
Patient advised per Melissa ok to come in at 4pm for evaluation.

## 2018-02-21 ENCOUNTER — Other Ambulatory Visit: Payer: Self-pay | Admitting: Family

## 2018-02-26 DIAGNOSIS — J329 Chronic sinusitis, unspecified: Secondary | ICD-10-CM | POA: Diagnosis not present

## 2018-03-09 DIAGNOSIS — B9620 Unspecified Escherichia coli [E. coli] as the cause of diseases classified elsewhere: Secondary | ICD-10-CM | POA: Diagnosis not present

## 2018-03-09 DIAGNOSIS — N3000 Acute cystitis without hematuria: Secondary | ICD-10-CM | POA: Diagnosis not present

## 2018-03-09 DIAGNOSIS — N3281 Overactive bladder: Secondary | ICD-10-CM | POA: Diagnosis not present

## 2018-03-09 DIAGNOSIS — N390 Urinary tract infection, site not specified: Secondary | ICD-10-CM | POA: Diagnosis not present

## 2018-03-15 ENCOUNTER — Encounter: Payer: Self-pay | Admitting: Family

## 2018-03-15 MED ORDER — ALPRAZOLAM 0.5 MG PO TABS: 0.5000 mg | ORAL_TABLET | Freq: Two times a day (BID) | ORAL | 1 refills | Status: DC

## 2018-03-15 MED ORDER — FLUCONAZOLE 150 MG PO TABS: ORAL_TABLET | ORAL | 0 refills | Status: DC

## 2018-03-18 ENCOUNTER — Encounter: Payer: Self-pay | Admitting: Family

## 2018-03-19 MED ORDER — PAROXETINE HCL 30 MG PO TABS: 30.0000 mg | ORAL_TABLET | Freq: Every morning | ORAL | 1 refills | Status: DC

## 2018-04-12 DIAGNOSIS — M25812 Other specified joint disorders, left shoulder: Secondary | ICD-10-CM | POA: Diagnosis not present

## 2018-04-12 DIAGNOSIS — M25512 Pain in left shoulder: Secondary | ICD-10-CM | POA: Diagnosis not present

## 2018-05-09 ENCOUNTER — Encounter: Payer: Self-pay | Admitting: Family

## 2018-05-10 ENCOUNTER — Encounter: Payer: Self-pay | Admitting: Family

## 2018-05-18 ENCOUNTER — Other Ambulatory Visit: Payer: Self-pay | Admitting: Family

## 2018-05-20 NOTE — Telephone Encounter (Signed)
Please contact pt and schedule video visit.  She is due for follow up.

## 2018-05-25 ENCOUNTER — Encounter: Payer: Self-pay | Admitting: Family

## 2018-05-26 MED ORDER — FLUCONAZOLE 150 MG PO TABS: ORAL_TABLET | ORAL | 0 refills | Status: DC

## 2018-05-29 NOTE — Telephone Encounter (Signed)
Patient scheduled for Monday 06-04-18

## 2018-06-04 ENCOUNTER — Ambulatory Visit (INDEPENDENT_AMBULATORY_CARE_PROVIDER_SITE_OTHER): Payer: BLUE CROSS/BLUE SHIELD | Admitting: Family

## 2018-06-04 DIAGNOSIS — J302 Other seasonal allergic rhinitis: Secondary | ICD-10-CM | POA: Diagnosis not present

## 2018-06-04 DIAGNOSIS — E611 Iron deficiency: Secondary | ICD-10-CM

## 2018-06-04 DIAGNOSIS — F419 Anxiety disorder, unspecified: Secondary | ICD-10-CM

## 2018-06-04 DIAGNOSIS — J309 Allergic rhinitis, unspecified: Secondary | ICD-10-CM

## 2018-06-04 DIAGNOSIS — K219 Gastro-esophageal reflux disease without esophagitis: Secondary | ICD-10-CM | POA: Diagnosis not present

## 2018-06-04 DIAGNOSIS — N3281 Overactive bladder: Secondary | ICD-10-CM

## 2018-06-04 MED ORDER — CHOLECALCIFEROL 25 MCG (1000 UT) PO TABS: 1000.0000 [IU] | ORAL_TABLET | Freq: Every day | ORAL | Status: DC

## 2018-06-04 MED ORDER — TRIMETHOPRIM 100 MG PO TABS: 100.0000 mg | ORAL_TABLET | Freq: Every day | ORAL | Status: DC

## 2018-06-04 MED ORDER — AZELASTINE HCL 0.1 % NA SOLN: 1.0000 | Freq: Two times a day (BID) | NASAL | 12 refills | Status: DC

## 2018-06-04 MED ORDER — CYCLOSPORINE 0.05 % OP EMUL: 1.0000 [drp] | Freq: Two times a day (BID) | OPHTHALMIC | Status: DC

## 2018-06-04 MED ORDER — IRON 325 (65 FE) MG PO TABS: 1.0000 | ORAL_TABLET | Freq: Every day | ORAL | 0 refills | Status: DC

## 2018-06-04 MED ORDER — ALPRAZOLAM 0.5 MG PO TABS: 0.5000 mg | ORAL_TABLET | Freq: Two times a day (BID) | ORAL | 1 refills | Status: DC

## 2018-06-04 NOTE — Progress Notes (Signed)
Virtual Visit via Video Note  I connected with [name] [name] on 06/04/18 at 10:40 AM EDT by a video enabled telemedicine application and verified that I am speaking with the correct person using two identifiers.   I discussed the limitations of evaluation and management by telemedicine and the availability of in person appointments. The patient expressed understanding and agreed to proceed.  History of Present Illness:  Reports that she went to [facility].  Was given abx by her ENT and notes that she completed 1 week ago.  (Dr. Hassell Done). Symptoms improved. She was not able to schedule CT of her sinuses due to COVID-19.   Anxiety- reports that her daughter gets on her nerves.  She suffers schizoaffective disorder and is living with the patient with her 24 month old daughter.  Reports that she takes xanax QHS for sleep.  She does not usually take during the work day because it causes drowsiness.  Vit D- continues otc vit D.    Seasonal allergies- She continues allegra and flonase, symptoms are controlled with this.   GERD- stable on omeprazole.  Iron deficiency- continues iron 325mg  once daily.   Lab Results  Component Value Date   WBC 4.5 10/25/2017   HGB 12.7 10/25/2017   HCT 38.1 10/25/2017   MCV 85.9 10/25/2017   PLT 267.0 10/25/2017   On  Elavil for cystitis.  Also on myrbetriq.  Also on Trimethoprim 100 once daily per urology.  Genital herpes- reports no recent breakouts.  She continues valtrex.   Observations/Objective:   Gen: Awake, alert, no acute distress Resp: Breathing is even and non-labored Psych: calm/pleasant demeanor Neuro: Alert and Oriented x 3, + facial symmetry, speech is clear.  Assessment and Plan:  Anxiety- stable on paxil and prn xanax. Continue same.  GERD- stable on PPI, continue same.  Seasonal allergies- stable on current meds.  OAB- stable on current meds. Management per urology.  Iron deficiency- stable on iron 325mg .  Continue same.   Genital herpes- stable on valtrex. Continue same.  Follow Up Instructions:    I discussed the assessment and treatment plan with the patient. The patient was provided an opportunity to ask questions and all were answered. The patient agreed with the plan and demonstrated an understanding of the instructions.   The patient was advised to call back or seek an in-person evaluation if the symptoms worsen or if the condition fails to improve as anticipated.  Nance Pear, NP

## 2018-07-03 DIAGNOSIS — B9620 Unspecified Escherichia coli [E. coli] as the cause of diseases classified elsewhere: Secondary | ICD-10-CM | POA: Diagnosis not present

## 2018-07-03 DIAGNOSIS — N390 Urinary tract infection, site not specified: Secondary | ICD-10-CM | POA: Diagnosis not present

## 2018-07-03 DIAGNOSIS — N952 Postmenopausal atrophic vaginitis: Secondary | ICD-10-CM | POA: Diagnosis not present

## 2018-07-03 DIAGNOSIS — N3000 Acute cystitis without hematuria: Secondary | ICD-10-CM | POA: Diagnosis not present

## 2018-07-03 DIAGNOSIS — N3281 Overactive bladder: Secondary | ICD-10-CM | POA: Diagnosis not present

## 2018-07-10 ENCOUNTER — Other Ambulatory Visit (HOSPITAL_BASED_OUTPATIENT_CLINIC_OR_DEPARTMENT_OTHER): Payer: Self-pay | Admitting: Family

## 2018-07-10 DIAGNOSIS — Z1231 Encounter for screening mammogram for malignant neoplasm of breast: Secondary | ICD-10-CM

## 2018-07-10 DIAGNOSIS — M79671 Pain in right foot: Secondary | ICD-10-CM | POA: Diagnosis not present

## 2018-07-10 DIAGNOSIS — M19071 Primary osteoarthritis, right ankle and foot: Secondary | ICD-10-CM | POA: Diagnosis not present

## 2018-07-11 ENCOUNTER — Ambulatory Visit (HOSPITAL_BASED_OUTPATIENT_CLINIC_OR_DEPARTMENT_OTHER)
Admission: RE | Admit: 2018-07-11 | Discharge: 2018-07-11 | Disposition: A | Discharge status: Home / Self Care | Payer: BLUE CROSS/BLUE SHIELD | Source: Ambulatory Visit | Attending: Family | Admitting: Family

## 2018-07-11 ENCOUNTER — Other Ambulatory Visit: Payer: Self-pay

## 2018-07-11 DIAGNOSIS — Z1231 Encounter for screening mammogram for malignant neoplasm of breast: Secondary | ICD-10-CM | POA: Insufficient documentation

## 2018-07-13 DIAGNOSIS — R300 Dysuria: Secondary | ICD-10-CM | POA: Diagnosis not present

## 2018-07-13 DIAGNOSIS — N3281 Overactive bladder: Secondary | ICD-10-CM | POA: Diagnosis not present

## 2018-07-13 DIAGNOSIS — R8271 Bacteriuria: Secondary | ICD-10-CM | POA: Diagnosis not present

## 2018-08-10 ENCOUNTER — Other Ambulatory Visit: Payer: Self-pay | Admitting: Family

## 2018-08-11 ENCOUNTER — Other Ambulatory Visit: Payer: Self-pay | Admitting: Family

## 2018-08-16 ENCOUNTER — Other Ambulatory Visit: Payer: Self-pay | Admitting: Family

## 2018-08-20 ENCOUNTER — Encounter: Payer: Self-pay | Admitting: Family

## 2018-08-20 DIAGNOSIS — N3281 Overactive bladder: Secondary | ICD-10-CM | POA: Diagnosis not present

## 2018-08-20 DIAGNOSIS — R102 Pelvic and perineal pain: Secondary | ICD-10-CM | POA: Diagnosis not present

## 2018-08-20 DIAGNOSIS — N952 Postmenopausal atrophic vaginitis: Secondary | ICD-10-CM | POA: Diagnosis not present

## 2018-08-20 DIAGNOSIS — R300 Dysuria: Secondary | ICD-10-CM | POA: Diagnosis not present

## 2018-08-20 MED ORDER — FLUCONAZOLE 150 MG PO TABS: 150.0000 mg | ORAL_TABLET | Freq: Once | ORAL | 0 refills | Status: AC

## 2018-08-20 NOTE — Telephone Encounter (Signed)
Diflucan has been sent

## 2018-08-27 DIAGNOSIS — M19071 Primary osteoarthritis, right ankle and foot: Secondary | ICD-10-CM | POA: Diagnosis not present

## 2018-08-28 ENCOUNTER — Encounter: Payer: Self-pay | Admitting: Family

## 2018-08-28 DIAGNOSIS — M25512 Pain in left shoulder: Secondary | ICD-10-CM | POA: Diagnosis not present

## 2018-08-29 ENCOUNTER — Encounter: Payer: Self-pay | Admitting: Family

## 2018-08-29 NOTE — Telephone Encounter (Signed)
I spoke to patient. Will bring her in for a cpx next week.  Rod Holler can you please call pt to schedule CPX and also call Dr. Reather Littler office and request a copy of the MRI report of her shoulder for review?

## 2018-08-30 ENCOUNTER — Telehealth: Payer: Self-pay | Admitting: Hematology

## 2018-08-30 NOTE — Telephone Encounter (Signed)
Received a new patient referral from [facility] for multiple myeloma. I cld and spoke to [name] [name] and scheduled an appt for her to see Dr. Irene Limbo on 7/23 at 11am. She's been made aware to arrive 20 minutes early.

## 2018-08-30 NOTE — Telephone Encounter (Signed)
Patient was scheduled for a face to face ov with Melissa on Tuesday at 7 am at her request (wanted to see provider).

## 2018-09-04 ENCOUNTER — Ambulatory Visit (INDEPENDENT_AMBULATORY_CARE_PROVIDER_SITE_OTHER): Payer: BC Managed Care – PPO | Admitting: Family

## 2018-09-04 ENCOUNTER — Other Ambulatory Visit: Payer: Self-pay

## 2018-09-04 ENCOUNTER — Encounter: Payer: Self-pay | Admitting: Family

## 2018-09-04 VITALS — BP 137/75 | HR 91 | Temp 99.0°F | Resp 16 | Ht 64.5 in | Wt 237.0 lb

## 2018-09-04 DIAGNOSIS — E2839 Other primary ovarian failure: Secondary | ICD-10-CM

## 2018-09-04 DIAGNOSIS — M899 Disorder of bone, unspecified: Secondary | ICD-10-CM

## 2018-09-04 DIAGNOSIS — Z0001 Encounter for general adult medical examination with abnormal findings: Secondary | ICD-10-CM | POA: Diagnosis not present

## 2018-09-04 DIAGNOSIS — Z0000 Encounter for general adult medical examination without abnormal findings: Secondary | ICD-10-CM

## 2018-09-04 LAB — BASIC METABOLIC PANEL
BUN: 18 mg/dL (ref 6–23)
CO2: 30 mEq/L (ref 19–32)
Calcium: 9.1 mg/dL (ref 8.4–10.5)
Chloride: 101 mEq/L (ref 96–112)
Creatinine, Ser: 0.64 mg/dL (ref 0.40–1.20)
GFR: 93.92 mL/min (ref >60.00)
Glucose, Bld: 95 mg/dL (ref 70–99)
Potassium: 4.3 mEq/L (ref 3.5–5.1)
Sodium: 139 mEq/L (ref 135–145)

## 2018-09-04 LAB — HEPATIC FUNCTION PANEL
ALT: 18 U/L (ref 0–35)
AST: 21 U/L (ref 0–37)
Albumin: 4.1 g/dL (ref 3.5–5.2)
Alkaline Phosphatase: 110 U/L (ref 39–117)
Bilirubin, Direct: 0.1 mg/dL (ref 0.0–0.3)
Total Bilirubin: 0.4 mg/dL (ref 0.2–1.2)
Total Protein: 6.7 g/dL (ref 6.0–8.3)

## 2018-09-04 LAB — CBC WITH DIFFERENTIAL/PLATELET
Basophils Absolute: 0.1 10*3/uL (ref 0.0–0.1)
Basophils Relative: 1.3 % (ref 0.0–3.0)
Eosinophils Absolute: 0.2 10*3/uL (ref 0.0–0.7)
Eosinophils Relative: 4 % (ref 0.0–5.0)
HCT: 39.5 % (ref 36.0–46.0)
Hemoglobin: 12.9 g/dL (ref 12.0–15.0)
Lymphocytes Relative: 25.1 % (ref 12.0–46.0)
Lymphs Abs: 1.2 10*3/uL (ref 0.7–4.0)
MCHC: 32.7 g/dL (ref 30.0–36.0)
MCV: 90 fl (ref 78.0–100.0)
Monocytes Absolute: 0.5 10*3/uL (ref 0.1–1.0)
Monocytes Relative: 11.2 % (ref 3.0–12.0)
Neutro Abs: 2.8 10*3/uL (ref 1.4–7.7)
Neutrophils Relative %: 58.4 % (ref 43.0–77.0)
Platelets: 270 10*3/uL (ref 150.0–400.0)
RBC: 4.39 Mil/uL (ref 3.87–5.11)
RDW: 14.6 % (ref 11.5–15.5)
WBC: 4.7 10*3/uL (ref 4.0–10.5)

## 2018-09-04 LAB — LIPID PANEL
Cholesterol: 169 mg/dL (ref 0–200)
HDL: 51.3 mg/dL (ref >39.00)
LDL Cholesterol: 99 mg/dL (ref 0–99)
NonHDL: 118.17
Total CHOL/HDL Ratio: 3
Triglycerides: 97 mg/dL (ref 0.0–149.0)
VLDL: 19.4 mg/dL (ref 0.0–40.0)

## 2018-09-04 LAB — TSH: TSH: 1.03 u[IU]/mL (ref 0.35–4.50)

## 2018-09-04 NOTE — Progress Notes (Signed)
Subjective:    Patient ID: [name] [name], female    DOB: 12-03-1955, 63 y.o.   MRN: [id number]  HPI  Patient is a 63 yr old female who presents today to discuss the findings of her MRI of her shoulder that was performed by her orthopedist.   Patient presents today for complete physical.  Immunizations: tetanus 2014 Diet: reports fair diet Exercise:  Some walking Colonoscopy: 2018 Dexa: 2016- Pap Smear: 01/16/17 Mammogram: 07/11/18 Vision: 6 months ago Dental:  Up to date  Wt Readings from Last 3 Encounters:  09/04/18 237 lb (107.5 kg)  02/16/18 237 lb (107.5 kg)  01/22/18 241 lb (109.3 kg)     Review of Systems  Constitutional: Negative for unexpected weight change.  HENT: Positive for postnasal drip. Negative for hearing loss ("I don't hear as well as I did 10 year" ) and rhinorrhea.   Eyes:       Reports left eye vision is not as strong as right- not new  Respiratory: Negative for cough and shortness of breath.   Cardiovascular: Negative for chest pain.  Gastrointestinal: Negative for constipation and diarrhea.  Genitourinary: Negative for dysuria and frequency.  Musculoskeletal: Positive for arthralgias (left shoulder pain).  Skin: Negative for rash.  Neurological: Negative for headaches.  Hematological: Negative for adenopathy.  Psychiatric/Behavioral:       Denies depression.  Having some anxiety following discussion of her left shoulder MRI results   Past Medical History:  Diagnosis Date  . Allergy   . Anemia    takes iron supplement  . Anxiety   . Arthritis   . 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 Elevated alkaline phosphatase level    negative workup (presumed secondary to fatty liver)  . Frequency-urgency syndrome   . GERD (gastroesophageal reflux disease)   . Interstitial cystitis    Dr Vernie Shanks  . Neuromuscular disorder (HCC)    rt arm carpal tunnel syndrome  . Obesity    status post bariatric procedure in [facility] 2005  . Osteoporosis   .  Thyroid nodule 7/10   `  . Thyroid nodule   . Varicose vein of leg      Social History   Socioeconomic History  . Marital status: Divorced    Spouse name: Not on file  . Number of children: 1  . Years of education: Not on file  . Highest education level: Not on file  Occupational History  . 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[employer]  . Financial resource strain: Not on file  . Food insecurity    Worry: Not on file    Inability: Not on file  . Transportation needs    Medical: Not on file    Non-medical: Not on file  Tobacco Use  . Smoking status: Never Smoker  . Smokeless tobacco: Never Used  Substance and Sexual Activity  . Alcohol use: Yes    Comment: RARE  . Drug use: No  . Sexual activity: Yes    Comment: 1st intercourse 63 yo-More than 5 partners  Lifestyle  . Physical activity    Days per week: Not on file    Minutes per session: Not on file  . Stress: Not on file  Relationships  . Social Herbalist on phone: Not on file    Gets together: Not on file    Attends religious service: Not on file    Active member of club or  organization: Not on file    Attends meetings of clubs or organizations: Not on file    Relationship status: Not on file  . Intimate partner violence    Fear of current or ex partner: Not on file    Emotionally abused: Not on file    Physically abused: Not on file    Forced sexual activity: Not on file  Other Topics Concern  . Not on file  Social History Narrative  . Not on file    Past Surgical History:  Procedure Laterality Date  . ANTERIOR CERVICAL DECOMP/DISCECTOMY FUSION N/A 06/21/2012   Procedure: ANTERIOR CERVICAL DECOMPRESSION/DISCECTOMY FUSION C-4 - C5 (SPACER/DePUY CERVICAL PLATE ONLY) 1 LEVEL;  Surgeon: Melina Schools, MD;  Location: [place];  Service: Orthopedics;  Laterality: N/A;  . BLADDER SURGERY  02/21/12 and 02/28/12   Dr Diona Fanti  . COLPOSCOPY  1980  . CRYOTHERAPY  1980  . CYSTO  WITH HYDRODISTENSION  12/12/2011   Procedure: CYSTOSCOPY/HYDRODISTENSION;  Surgeon: Franchot Gallo, MD;  Location: [facility];  Service: Urology;  Laterality: N/A;  30 MIN   . EYE SURGERY  1997   bilateral cataract extraction  . FOOT SURGERY     bilateral bunionettes  . FOOT SURGERY Right 01/07/14   Pt states surgery was due to arthritis. "Has pins in place now"  . GASTRIC BYPASS  2005  . JOINT REPLACEMENT  2008   left total knee  . KNEE SURGERY  1997   left knee  . NASAL SINUS SURGERY  7673   Dr Erik Obey  . REFRACTIVE SURGERY  2006   lasik  . SHOULDER SURGERY  2 2012   RIGHT   . STOMACH SURGERY  2007   "tummy tuck"  . TOTAL KNEE ARTHROPLASTY Right 12/31/2015   Procedure: RIGHT TOTAL KNEE ARTHROPLASTY;  Surgeon: [name], MD;  Location: [facility];  Service: Orthopedics;  Laterality: Right;  Adductor Block  . VARICOSE VEIN SURGERY Right 02/2012   leg    Family History  Problem Relation Age of Onset  . Diabetes Paternal Grandfather   . Stroke Paternal Grandfather   . Hyperlipidemia Mother   . Hypertension Mother   . Hypertension Father   . Diabetes Father   . Prostate cancer Father   . Dementia Father   . Heart disease Maternal Grandmother   . Hypertension Maternal Grandmother   . Heart disease Maternal Grandfather   . Hypertension Maternal Grandfather   . Stroke Maternal Grandfather   . Colon cancer Neg Hx   . Esophageal cancer Neg Hx   . Pancreatic cancer Neg Hx   . Stomach cancer Neg Hx     Allergies  Allergen Reactions  . 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  No allergy.  . Nitrofurantoin     REACTION: fever    Current Outpatient Medications on File Prior to Visit  Medication Sig Dispense Refill  . acetaminophen (TYLENOL) 650 MG CR tablet Take 2 tablets (1,300 mg total) by mouth every 8 (eight) hours as needed for pain. Take for mild pain. Do not combine with Percocet. Do not exceed daily recommended  limit of Tylenol.    . ALPRAZolam (XANAX) 0.5 MG tablet TAKE 1 TABLET (0.5 MG TOTAL) BY MOUTH 2 (TWO) TIMES DAILY. 60 tablet 1  . amitriptyline (ELAVIL) 50 MG tablet TAKE 1 TABLET BY MOUTH EVERY EVENING  3  . azelastine (ASTELIN) 0.1 % nasal spray Place 1 spray into both nostrils  2 (two) times daily. Use in each nostril as directed 30 mL 12  . BIOTIN PO Take by mouth.    . Calcium Carbonate Antacid (TUMS ULTRA 1000 PO) Take 1,000 mg by mouth 2 (two) times daily.    . Cholecalciferol (VITAMIN D-1000 MAX ST) 25 MCG (1000 UT) tablet Take 1 tablet (1,000 Units total) by mouth daily.    . Ferrous Sulfate (IRON) 325 (65 Fe) MG TABS Take 1 tablet (325 mg total) by mouth daily. 30 each 0  . Fexofenadine HCl (ALLEGRA PO) Take by mouth.    . fluticasone (FLONASE) 50 MCG/ACT nasal spray Place 2 sprays into both nostrils daily. 16 g 6  . Lifitegrast (XIIDRA) 5 % SOLN Xiidra 5 % eye drops in a dropperette  INSTILL 1 DROP INTO BOTH EYES TWICE A DAY AS DIRECTED    . Multiple Vitamin (MULTIVITAMIN) tablet Take 1 tablet 2 (two) times daily by mouth.    . multivitamin-lutein (OCUVITE-LUTEIN) CAPS capsule Take 1 capsule daily by mouth.    Marland Kitchen MYRBETRIQ 50 MG TB24 tablet Take 50 mg by mouth every evening.   2  . Omega-3 Fatty Acids (FISH OIL PO) Take 2 (two) times daily by mouth.    Marland Kitchen omeprazole (PRILOSEC) 40 MG capsule TAKE 1 CAPSULE BY MOUTH EVERY DAY 90 capsule 0  . OVER THE COUNTER MEDICATION Place 1 drop into both eyes 2 (two) times daily. Allergy eye drops    . PARoxetine (PAXIL) 30 MG tablet Take 1 tablet (30 mg total) by mouth every morning. 90 tablet 1  . trimethoprim (TRIMPEX) 100 MG tablet Take 1 tablet (100 mg total) by mouth daily.    . valACYclovir (VALTREX) 1000 MG tablet Take 0.5 tablets (500 mg total) by mouth daily. 30 tablet 11  . VITAMIN A PO Take by mouth.    . cycloSPORINE (RESTASIS) 0.05 % ophthalmic emulsion Place 1 drop into both eyes 2 (two) times daily. 0.4 mL    No current  facility-administered medications on file prior to visit.     BP 137/75 (BP Location: Right Arm, Patient Position: Sitting, Cuff Size: Large)   Pulse 91   Temp 99 F (37.2 C) (Oral)   Resp 16   Ht 5' 4.5" (1.638 m)   Wt 237 lb (107.5 kg)   SpO2 99%   BMI 40.05 kg/m       Objective:   Physical Exam  Physical Exam  Constitutional: She is oriented to person, place, and time. She appears well-developed and well-nourished. No distress.  HENT:  Head: Normocephalic and atraumatic.  Right Ear: Tympanic membrane and ear canal normal.  Left Ear: Tympanic membrane and ear canal normal.  Mouth/Throat: Oropharynx is clear and moist.  Eyes: Pupils are equal, round, and reactive to light. No scleral icterus.  Neck: Normal range of motion. No thyromegaly present.  Cardiovascular: Normal rate and regular rhythm.   No murmur heard. Pulmonary/Chest: Effort normal and breath sounds normal. No respiratory distress. He has no wheezes. She has no rales. She exhibits no tenderness.  Abdominal: Soft. Bowel sounds are normal. She exhibits no distension and no mass. There is no tenderness. There is no rebound and no guarding.  Musculoskeletal: She exhibits no edema.  Lymphadenopathy:    She has no cervical adenopathy.  Neurological: She is alert and oriented to person, place, and time. She has normal patellar reflexes. She exhibits normal muscle tone. Coordination normal.  Skin: Skin is warm and dry.  Psychiatric: She has a normal  mood and affect. Her behavior is normal. Judgment and thought content normal.  Breast/pelvic: deferred          Assessment & Plan:   Preventative care- discussed diet/exercise/weight loss. Pap, mammo, colo up to date. Obtain routine lab work. Due for dexa scan. Will order.   Bone lesions- reviewed outside MRI of left shoulder.  Noted Multifocal small osseous lesions in the proximal humerus and similar lesion in the glenoid, highly suspicious for malignancy. She has an  appointment on Thursday with oncology. Will send SPEP today.      Assessment & Plan:

## 2018-09-04 NOTE — Patient Instructions (Signed)
Please complete lab work prior to leaving. Continue to work on healthy diet, exercise and weight loss.  

## 2018-09-05 NOTE — Progress Notes (Signed)
HEMATOLOGY/ONCOLOGY CONSULTATION NOTE  Date of Service: 09/06/2018  Patient Care Team: [name], NP as PCP - General (Internal Medicine)  CHIEF COMPLAINTS/PURPOSE OF CONSULTATION:  Bone lesions concerning for Multiple Myeloma  HISTORY OF PRESENTING ILLNESS:   [name] [name] is a wonderful 63 y.o. female who has been referred to [place] by Dr. [name] from [facility] for evaluation and management of multiple myeloma.The pt reports that she is doing well overall.   The pt reports that she saw Dr. Tonita Cong for shoulder pain and he wanted to order an MRI. She got a shot of cortisone and Dr. Tonita Cong called the next morning and said that she had bone lesions in her left shoulder. She has not had any issues with her left shoulder until a year and a half ago, and she thinks the pain is due to picking up her grandchild. She reports a new pain in her left hip/bottom that began within the past 6 months.  She had a bariatric surgery in 2005 and has been taking iron pills since then. She also takes a multivitamin, OTC Vit D, Vit B12, zinc, Vit A, and Vit C. She has also been on acid suppressants for reflux. She takes Allegra, Omeprazole, and Flonase for her allergies. She takes Xanax before bedtime for her anxiety.  She sees a Dr. At [facility] for interstitial cystitis. She feels more discomfort when she drinks soda. Normally, she drinks mostly water. She drinks one cup of coffee daily and rarely drinks alcohol. Pt has not had a cystoscopy. Denies vaginal bleeding or abnormal discharge.  Of note prior to the patient's visit today, pt has had 3D mammogram completed on 07/11/2018 with results revealing "breast density category B, no mammographic evidence of malignancy."   She also had a shoulder MRI wo contrast on 08/28/2018, which revealed "1. Multifocal small osseous lesions in the proximal humerus and a single similar lesion in the glenoid, highly suspicious for malignancy, such as  multiple myeloma or metastatic disease. 2. Full-thickness, full width teat of the suprespinatus tendon at the greater tuberosity with about 4cm tendon retrac ion. Mild supraspinatus muscle atrophy. 3. Mild diffuse subscapularis and infraspinatus tendinosis. Minimal interstitial tearing of the infraspinatus tendon. Moderate to sever diffuse infraspinatus msucle atrophy. 4. Moderate to severe diffuse atrophy of the teres minor muscle likely related to chronic denervation. No mass in the quadrilateral space. 5. 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 6. Mild lateral downsloping of the acromion and a mild subacromial enthesopathy. 7. Mild glenohumeral athrosis. 8. Degeneration and probable tear of the superior labrum. Associated small intraosseus ganglion and marrow edema in the adjacent glenoid."  Most recent lab results (09/04/2018) of CBC is as follows: all values are WNL. 09/04/2018 TSH at 1.03 14/48/1856 basal metabolic panel all WNL 31/49/7026 hepatic function panel all WNL 09/04/2018 lipid panel all WNL 09/04/2018 protein electrophoresis is pending  On review of systems, pt reports left shoulder pain, right hip/bottom pain and denies fever, chills, night sweats, unexpected weight loss, changes in bowel habits, and any other symptoms.   On PMHx the pt reports hx of 2 knee replacements, arthritis, allergies, anxiety On Social Hx the pt reports rare alcohol usage and has never smoked cigarettes On Family Hx the pt reports that her dad was diagnosed with prostate cancer at 62 y.o. and her brother was diagnosed with testicular cancer in his mid 40s-40s.   MEDICAL HISTORY:  Past Medical History:  Diagnosis Date  . Allergy   .  Anemia    takes iron supplement  . Anxiety   . Arthritis   . 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 Elevated alkaline phosphatase level    negative workup (presumed secondary to fatty liver)  . Frequency-urgency syndrome   . GERD (gastroesophageal reflux  disease)   . Interstitial cystitis    Dr Vernie Shanks  . Neuromuscular disorder (HCC)    rt arm carpal tunnel syndrome  . Obesity    status post bariatric procedure in [facility] 2005  . Osteoporosis   . Thyroid nodule 7/10   `  . Thyroid nodule   . Varicose vein of leg     SURGICAL HISTORY: Past Surgical History:  Procedure Laterality Date  . ANTERIOR CERVICAL DECOMP/DISCECTOMY FUSION N/A 06/21/2012   Procedure: ANTERIOR CERVICAL DECOMPRESSION/DISCECTOMY FUSION C-4 - C5 (SPACER/DePUY CERVICAL PLATE ONLY) 1 LEVEL;  Surgeon: Melina Schools, MD;  Location: [place];  Service: Orthopedics;  Laterality: N/A;  . BLADDER SURGERY  02/21/12 and 02/28/12   Dr Diona Fanti  . COLPOSCOPY  1980  . CRYOTHERAPY  1980  . CYSTO WITH HYDRODISTENSION  12/12/2011   Procedure: CYSTOSCOPY/HYDRODISTENSION;  Surgeon: Franchot Gallo, MD;  Location: [facility];  Service: Urology;  Laterality: N/A;  30 MIN   . EYE SURGERY  1997   bilateral cataract extraction  . FOOT SURGERY     bilateral bunionettes  . FOOT SURGERY Right 01/07/14   Pt states surgery was due to arthritis. "Has pins in place now"  . GASTRIC BYPASS  2005  . JOINT REPLACEMENT  2008   left total knee  . KNEE SURGERY  1997   left knee  . NASAL SINUS SURGERY  7116   Dr Erik Obey  . REFRACTIVE SURGERY  2006   lasik  . SHOULDER SURGERY  2 2012   RIGHT   . STOMACH SURGERY  2007   "tummy tuck"  . TOTAL KNEE ARTHROPLASTY Right 12/31/2015   Procedure: RIGHT TOTAL KNEE ARTHROPLASTY;  Surgeon: [name], MD;  Location: [facility];  Service: Orthopedics;  Laterality: Right;  Adductor Block  . VARICOSE VEIN SURGERY Right 02/2012   leg    SOCIAL HISTORY: Social History   Socioeconomic History  . Marital status: Divorced    Spouse name: Not on file  . Number of children: 1  . Years of education: Not on file  . Highest education level: Not on file  Occupational History  . 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[employer]  . Financial resource strain: Not on file  . Food insecurity    Worry: Not on file    Inability: Not on file  . Transportation needs    Medical: Not on file    Non-medical: Not on file  Tobacco Use  . Smoking status: Never Smoker  . Smokeless tobacco: Never Used  Substance and Sexual Activity  . Alcohol use: Yes    Comment: RARE  . Drug use: No  . Sexual activity: Yes    Comment: 1st intercourse 63 yo-More than 5 partners  Lifestyle  . Physical activity    Days per week: Not on file    Minutes per session: Not on file  . Stress: Not on file  Relationships  . Social Herbalist on phone: Not on file    Gets together: Not on file    Attends religious service: Not on file    Active member of club or organization: Not on  file    Attends meetings of clubs or organizations: Not on file    Relationship status: Not on file  . Intimate partner violence    Fear of current or ex partner: Not on file    Emotionally abused: Not on file    Physically abused: Not on file    Forced sexual activity: Not on file  Other Topics Concern  . Not on file  Social History Narrative  . Not on file    FAMILY HISTORY: Family History  Problem Relation Age of Onset  . Diabetes Paternal Grandfather   . Stroke Paternal Grandfather   . Hyperlipidemia Mother   . Hypertension Mother   . Hypertension Father   . Diabetes Father   . Prostate cancer Father   . Dementia Father   . Heart disease Maternal Grandmother   . Hypertension Maternal Grandmother   . Heart disease Maternal Grandfather   . Hypertension Maternal Grandfather   . Stroke Maternal Grandfather   . Arthritis Brother   . Testicular cancer Brother   . Colon cancer Neg Hx   . Esophageal cancer Neg Hx   . Pancreatic cancer Neg Hx   . Stomach cancer Neg Hx     ALLERGIES:  is allergic to ibuprofen and nitrofurantoin.  MEDICATIONS:  Current Outpatient Medications  Medication Sig Dispense Refill  .  acetaminophen (TYLENOL) 650 MG CR tablet Take 2 tablets (1,300 mg total) by mouth every 8 (eight) hours as needed for pain. Take for mild pain. Do not combine with Percocet. Do not exceed daily recommended limit of Tylenol.    . ALPRAZolam (XANAX) 0.5 MG tablet TAKE 1 TABLET (0.5 MG TOTAL) BY MOUTH 2 (TWO) TIMES DAILY. 60 tablet 1  . amitriptyline (ELAVIL) 50 MG tablet TAKE 1 TABLET BY MOUTH EVERY EVENING  3  . azelastine (ASTELIN) 0.1 % nasal spray Place 1 spray into both nostrils 2 (two) times daily. Use in each nostril as directed 30 mL 12  . BIOTIN PO Take by mouth.    . Calcium Carbonate Antacid (TUMS ULTRA 1000 PO) Take 1,000 mg by mouth 2 (two) times daily.    . Cholecalciferol (VITAMIN D-1000 MAX ST) 25 MCG (1000 UT) tablet Take 1 tablet (1,000 Units total) by mouth daily.    . cycloSPORINE (RESTASIS) 0.05 % ophthalmic emulsion Place 1 drop into both eyes 2 (two) times daily. 0.4 mL   . Ferrous Sulfate (IRON) 325 (65 Fe) MG TABS Take 1 tablet (325 mg total) by mouth daily. 30 each 0  . Fexofenadine HCl (ALLEGRA PO) Take by mouth.    . fluticasone (FLONASE) 50 MCG/ACT nasal spray Place 2 sprays into both nostrils daily. 16 g 6  . Lifitegrast (XIIDRA) 5 % SOLN Xiidra 5 % eye drops in a dropperette  INSTILL 1 DROP INTO BOTH EYES TWICE A DAY AS DIRECTED    . Multiple Vitamin (MULTIVITAMIN) tablet Take 1 tablet 2 (two) times daily by mouth.    . multivitamin-lutein (OCUVITE-LUTEIN) CAPS capsule Take 1 capsule daily by mouth.    Marland Kitchen MYRBETRIQ 50 MG TB24 tablet Take 50 mg by mouth every evening.   2  . Omega-3 Fatty Acids (FISH OIL PO) Take 2 (two) times daily by mouth.    Marland Kitchen omeprazole (PRILOSEC) 40 MG capsule TAKE 1 CAPSULE BY MOUTH EVERY DAY 90 capsule 0  . OVER THE COUNTER MEDICATION Place 1 drop into both eyes 2 (two) times daily. Allergy eye drops    . PARoxetine (PAXIL) 30  MG tablet Take 1 tablet (30 mg total) by mouth every morning. 90 tablet 1  . trimethoprim (TRIMPEX) 100 MG tablet Take 1  tablet (100 mg total) by mouth daily.    . valACYclovir (VALTREX) 1000 MG tablet Take 0.5 tablets (500 mg total) by mouth daily. 30 tablet 11  . VITAMIN A PO Take by mouth.     No current facility-administered medications for this visit.     REVIEW OF SYSTEMS:    A 10+ POINT REVIEW OF SYSTEMS WAS OBTAINED including neurology, dermatology, psychiatry, cardiac, respiratory, lymph, extremities, GI, GU, Musculoskeletal, constitutional, breasts, reproductive, HEENT.  All pertinent positives are noted in the HPI.  All others are negative.   PHYSICAL EXAMINATION: ECOG PERFORMANCE STATUS: 1 - Symptomatic but completely ambulatory  . Vitals:   09/06/18 1123  BP: 126/73  Pulse: 89  Resp: 18  Temp: (!) 96.9 F (36.1 C)  SpO2: 95%   Filed Weights   09/06/18 1123  Weight: 239 lb 9.6 oz (108.7 kg)   .Body mass index is 40.49 kg/m.  GENERAL:alert, in no acute distress and comfortable SKIN: no acute rashes, no significant lesions EYES: conjunctiva are pink and non-injected, sclera anicteric OROPHARYNX: MMM, no exudates, no oropharyngeal erythema or ulceration NECK: supple, no JVD LYMPH:  no palpable lymphadenopathy in the cervical, axillary or inguinal regions LUNGS: clear to auscultation b/l with normal respiratory effort HEART: regular rate & rhythm ABDOMEN:  normoactive bowel sounds , non tender, not distended. Extremity: pain in the left shoulder with palpation, no pedal edema PSYCH: alert & oriented x 3 with fluent speech NEURO: no focal motor/sensory deficits  LABORATORY DATA:  I have reviewed the data as listed Component     Latest Ref Rng & Units 09/04/2018  WBC     4.0 - 10.5 K/uL 4.7  RBC     3.87 - 5.11 Mil/uL 4.39  Hemoglobin     12.0 - 15.0 g/dL 12.9  HCT     36.0 - 46.0 % 39.5  MCV     78.0 - 100.0 fl 90.0  MCHC     30.0 - 36.0 g/dL 32.7  RDW     11.5 - 15.5 % 14.6  Platelets     150.0 - 400.0 K/uL 270.0  Neutrophils     43.0 - 77.0 % 58.4  Lymphocytes      12.0 - 46.0 % 25.1  Monocytes Relative     3.0 - 12.0 % 11.2  Eosinophil     0.0 - 5.0 % 4.0  Basophil     0.0 - 3.0 % 1.3  NEUT#     1.4 - 7.7 K/uL 2.8  Lymphocyte #     0.7 - 4.0 K/uL 1.2  Monocyte #     0.1 - 1.0 K/uL 0.5  Eosinophils Absolute     0.0 - 0.7 K/uL 0.2  Basophils Absolute     0.0 - 0.1 K/uL 0.1  Sodium     135 - 145 mEq/L 139  Potassium     3.5 - 5.1 mEq/L 4.3  Chloride     96 - 112 mEq/L 101  CO2     19 - 32 mEq/L 30  Glucose     70 - 99 mg/dL 95  BUN     6 - 23 mg/dL 18  Creatinine     0.40 - 1.20 mg/dL 0.64  Calcium     8.4 - 10.5 mg/dL 9.1  GFR     >60.00  mL/min 93.92  Total Bilirubin     0.2 - 1.2 mg/dL 0.4  Bilirubin, Direct     0.0 - 0.3 mg/dL 0.1  Alkaline Phosphatase     39 - 117 U/L 110  AST     0 - 37 U/L 21  ALT     0 - 35 U/L 18  Total Protein     6.0 - 8.3 g/dL 6.7  Albumin     3.5 - 5.2 g/dL 4.1  Cholesterol     0 - 200 mg/dL 169  Triglycerides     0.0 - 149.0 mg/dL 97.0  HDL Cholesterol     >39.00 mg/dL 51.30  VLDL     0.0 - 40.0 mg/dL 19.4  LDL (calc)     0 - 99 mg/dL 99  Total CHOL/HDL Ratio      3  NonHDL      118.17  TSH     0.35 - 4.50 uIU/mL 1.03   . CBC Latest Ref Rng & Units 09/04/2018 10/25/2017 09/15/2017  WBC 4.0 - 10.5 K/uL 4.7 4.5 4.7  Hemoglobin 12.0 - 15.0 g/dL 12.9 12.7 12.4  Hematocrit 36.0 - 46.0 % 39.5 38.1 37.3  Platelets 150.0 - 400.0 K/uL 270.0 267.0 250.0    . CMP Latest Ref Rng & Units 09/04/2018 09/04/2018 09/15/2017  Glucose 70 - 99 mg/dL - 95 120(H)  BUN 6 - 23 mg/dL - 18 16  Creatinine 0.40 - 1.20 mg/dL - 0.64 0.54  Sodium 135 - 145 mEq/L - 139 142  Potassium 3.5 - 5.1 mEq/L - 4.3 4.2  Chloride 96 - 112 mEq/L - 101 108  CO2 19 - 32 mEq/L - 30 28  Calcium 8.4 - 10.5 mg/dL - 9.1 8.9  Total Protein 6.1 - 8.1 g/dL 6.7 6.7 6.6  Total Bilirubin 0.2 - 1.2 mg/dL - 0.4 0.4  Alkaline Phos 39 - 117 U/L - 110 87  AST 0 - 37 U/L - 21 14  ALT 0 - 35 U/L - 18 15     RADIOGRAPHIC  STUDIES: I have personally reviewed the radiological images as listed and agreed with the findings in the report. No results found.   08/28/2018 Shoulder MRI without contrast:  "1. Multifocal small osseous lesions in the proximal humerus and a single similar lesion in the glenoid, highly suspicious for malignancy, such as multiple myeloma or metastatic disease. 2. Full-thickness, full width teat of the suprespinatus tendon at the greater tuberosity with about 4cm tendon retrac ion. Mild supraspinatus muscle atrophy. 3. Mild diffuse subscapularis and infraspinatus tendinosis. Minimal interstitial tearing of the infraspinatus tendon. Moderate to sever diffuse infraspinatus msucle atrophy. 4. Moderate to severe diffuse atrophy of the teres minor muscle likely related to chronic denervation. No mass in the quadrilateral space. 5. 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 6. Mild lateral downsloping of the acromion and a mild subacromial enthesopathy. 7. Mild glenohumeral athrosis. 8. Degeneration and probable tear of the superior labrum. Associated small intraosseus ganglion and marrow edema in the adjacent glenoid."  ASSESSMENT & PLAN:   1. Bone metastases with unknown primary noted on MRI shoulder  PLAN: -Discussed patient's most recent labs from 09/04/2018, thyroid functions and kidney function are normal. Blood tests are not concerning. -Discussed CRAB criteria. Pt had normal calcium, no kidney failure, not anemic. Unsure about bone lesions because MRI is not available yet.  -lab workup ordered as noted below -Schedule PET/CT to check for more bone lesions -Discussed 2018 colonoscopy, which revealed  no concerns.  -Check Vit D today -Pt will follow up in 2 weeks  Labs today PET/CT in 1 week RTC with Dr Irene Limbo in 2 weeks   Orders Placed This Encounter  Procedures  . NM PET Image Initial (PI) Skull Base To Thigh    Standing Status:   Future    Standing Expiration Date:    09/06/2019    Order Specific Question:   ** REASON FOR EXAM (FREE TEXT)    Answer:   Multiple bone lesions in left shoulder concerning for metastatic malignancy with unknown primary    Order Specific Question:   If indicated for the ordered procedure, I authorize the administration of a radiopharmaceutical per Radiology protocol    Answer:   Yes    Order Specific Question:   Preferred imaging location?    Answer:   [facility]    Order Specific Question:   Radiology Contrast Protocol - do NOT remove file path    Answer:   [email or web address]  . Ferritin    Standing Status:   Future    Number of Occurrences:   1    Standing Expiration Date:   09/06/2019  . Vitamin B12    Standing Status:   Future    Number of Occurrences:   1    Standing Expiration Date:   09/06/2019  . Vitamin D 25 hydroxy    Standing Status:   Future    Number of Occurrences:   1    Standing Expiration Date:   09/06/2019  . Kappa/lambda light chains    Standing Status:   Future    Number of Occurrences:   1    Standing Expiration Date:   10/11/2019  . Sedimentation rate    Standing Status:   Future    Number of Occurrences:   1    Standing Expiration Date:   09/06/2019  . Lactate dehydrogenase    Standing Status:   Future    Number of Occurrences:   1    Standing Expiration Date:   09/06/2019  . Beta 2 microglobulin, serum    Standing Status:   Future    Number of Occurrences:   1    Standing Expiration Date:   09/06/2019  . Multiple Myeloma Panel (SPEP&IFE w/QIG)    Standing Status:   Future    Number of Occurrences:   1    Standing Expiration Date:   09/06/2019    All of the patients questions were answered with apparent satisfaction. The patient knows to call the clinic with any problems, questions or concerns.  I spent 45 min counseling the patient face to face. The total time spent in the appointment was 60 min and more than 50% was on counseling and direct patient cares.  Sullivan Lone MD MS AAHIVMS [facility] Hematology/Oncology Physician [facility]  (Office):       [phone] (Work cell):  [phone] (Fax):           [phone]  09/06/2018 12:22 PM  I, De Burrs, am acting as a scribe for Dr. Irene Limbo  .I have reviewed the above documentation for accuracy and completeness, and I agree with the above. [name] MD

## 2018-09-06 ENCOUNTER — Inpatient Hospital Stay: Payer: BC Managed Care – PPO

## 2018-09-06 ENCOUNTER — Other Ambulatory Visit: Payer: Self-pay

## 2018-09-06 ENCOUNTER — Inpatient Hospital Stay: Payer: BC Managed Care – PPO | Attending: Hematology | Admitting: Hematology

## 2018-09-06 ENCOUNTER — Encounter: Payer: Self-pay | Admitting: Family

## 2018-09-06 ENCOUNTER — Telehealth: Payer: Self-pay | Admitting: Hematology

## 2018-09-06 VITALS — BP 126/73 | HR 89 | Temp 96.9°F | Resp 18 | Ht 64.5 in | Wt 239.6 lb

## 2018-09-06 DIAGNOSIS — C7951 Secondary malignant neoplasm of bone: Secondary | ICD-10-CM

## 2018-09-06 DIAGNOSIS — M25552 Pain in left hip: Secondary | ICD-10-CM

## 2018-09-06 DIAGNOSIS — C801 Malignant (primary) neoplasm, unspecified: Secondary | ICD-10-CM

## 2018-09-06 DIAGNOSIS — K219 Gastro-esophageal reflux disease without esophagitis: Secondary | ICD-10-CM | POA: Diagnosis not present

## 2018-09-06 DIAGNOSIS — Z9884 Bariatric surgery status: Secondary | ICD-10-CM

## 2018-09-06 DIAGNOSIS — F419 Anxiety disorder, unspecified: Secondary | ICD-10-CM | POA: Diagnosis not present

## 2018-09-06 LAB — SEDIMENTATION RATE: Sed Rate: 19 mm/hr (ref 0–22)

## 2018-09-06 LAB — VITAMIN B12: Vitamin B-12: 1415 pg/mL — ABNORMAL HIGH (ref 180–914)

## 2018-09-06 LAB — FERRITIN: Ferritin: 241 ng/mL (ref 11–307)

## 2018-09-06 LAB — PROTEIN ELECTROPHORESIS, SERUM
Albumin ELP: 3.9 g/dL (ref 3.8–4.8)
Alpha 1: 0.2 g/dL (ref 0.2–0.3)
Alpha 2: 0.6 g/dL (ref 0.5–0.9)
Beta 2: 0.4 g/dL (ref 0.2–0.5)
Beta Globulin: 0.4 g/dL (ref 0.4–0.6)
Gamma Globulin: 1.1 g/dL (ref 0.8–1.7)
Total Protein: 6.7 g/dL (ref 6.1–8.1)

## 2018-09-06 LAB — LACTATE DEHYDROGENASE: LDH: 224 U/L — ABNORMAL HIGH (ref 98–192)

## 2018-09-06 NOTE — Patient Instructions (Signed)
Thank you for choosing [facility] to provide your oncology and hematology care.   Should you have questions after your visit to the [facility] ([facility]), please contact this office at [phone] between 8:30 AM and 4:30 PM. Voicemails left after 4:00 PM may not be returned until the following business day. Calls received after 4:30 PM will be answered by an off-site Nurse Triage Line.    Prescription Refills:  Please have your pharmacy contact us directly for most prescription requests.  Contact the office directly for refills of narcotics (pain medications). Allow 48-72 hours for refills.  Appointments: Please contact the [facility] scheduling department [phone] for questions regarding [facility] appointment scheduling.  Contact the schedulers with any scheduling changes so that your appointment can be rescheduled in a timely manner.   Central Scheduling for [place] [phone] - Call to schedule procedures such as PET scans, CT scans, MRI, Ultrasound, etc.  To afford each patient quality time with our providers, please arrive 30 minutes before your scheduled appointment time.  If you arrive late for your appointment, you may be asked to reschedule.  We strive to give you quality time with our providers, and arriving late affects you and other patients whose appointments are after yours. If you are a no show for multiple scheduled visits, you may be dismissed from the clinic at the providers discretion.     Resources: [facility] Social Workers [phone] for additional information on assistance programs --Anne Cunningham/Abigail Elmore  [place] DSS  [phone]: Information regarding food stamps, Medicaid, and utility assistance [employer] 336-333-6589   [place] [employer]'s shared-ride transportation service for eligible riders who have a disability that prevents them from riding the fixed route bus.   Medicare Rights Center 800-333-4114 Helps people with  Medicare understand their rights and benefits, navigate the Medicare system, and secure the quality healthcare they deserve [employer] 800-227-2345 Assists patients locate various types of support and financial assistance Cancer Care: 1-800-813-HOPE (4673) Provides financial assistance, online support groups, medication/co-pay assistance.      

## 2018-09-06 NOTE — Telephone Encounter (Signed)
Scheduled appt per 7/23 los.  Printed calendar per patient request and gave her the number to central radiology.

## 2018-09-07 LAB — KAPPA/LAMBDA LIGHT CHAINS
Kappa free light chain: 19.2 mg/L (ref 3.3–19.4)
Kappa, lambda light chain ratio: 0.91 (ref 0.26–1.65)
Lambda free light chains: 21.2 mg/L (ref 5.7–26.3)

## 2018-09-07 LAB — BETA 2 MICROGLOBULIN, SERUM: Beta-2 Microglobulin: 2.4 mg/L (ref 0.6–2.4)

## 2018-09-07 LAB — VITAMIN D 25 HYDROXY (VIT D DEFICIENCY, FRACTURES): Vit D, 25-Hydroxy: 23.5 ng/mL — ABNORMAL LOW (ref 30.0–100.0)

## 2018-09-09 LAB — MULTIPLE MYELOMA PANEL, SERUM
Albumin SerPl Elph-Mcnc: 4.1 g/dL (ref 2.9–4.4)
Albumin/Glob SerPl: 1.4 (ref 0.7–1.7)
Alpha 1: 0.2 g/dL (ref 0.0–0.4)
Alpha2 Glob SerPl Elph-Mcnc: 0.8 g/dL (ref 0.4–1.0)
B-Globulin SerPl Elph-Mcnc: 0.9 g/dL (ref 0.7–1.3)
Gamma Glob SerPl Elph-Mcnc: 1.1 g/dL (ref 0.4–1.8)
Globulin, Total: 3 g/dL (ref 2.2–3.9)
IgA: 377 mg/dL — ABNORMAL HIGH (ref 87–352)
IgG (Immunoglobin G), Serum: 1254 mg/dL (ref 586–1602)
IgM (Immunoglobulin M), Srm: 12 mg/dL — ABNORMAL LOW (ref 26–217)
Total Protein ELP: 7.1 g/dL (ref 6.0–8.5)

## 2018-09-17 ENCOUNTER — Encounter (HOSPITAL_COMMUNITY)
Admission: RE | Admit: 2018-09-17 | Discharge: 2018-09-17 | Disposition: A | Discharge status: Home / Self Care | Payer: BC Managed Care – PPO | Source: Ambulatory Visit | Attending: Hematology | Admitting: Hematology

## 2018-09-17 ENCOUNTER — Other Ambulatory Visit: Payer: Self-pay

## 2018-09-17 DIAGNOSIS — C7989 Secondary malignant neoplasm of other specified sites: Secondary | ICD-10-CM | POA: Diagnosis not present

## 2018-09-17 DIAGNOSIS — C7951 Secondary malignant neoplasm of bone: Secondary | ICD-10-CM | POA: Diagnosis not present

## 2018-09-17 DIAGNOSIS — C779 Secondary and unspecified malignant neoplasm of lymph node, unspecified: Secondary | ICD-10-CM | POA: Diagnosis not present

## 2018-09-17 DIAGNOSIS — C770 Secondary and unspecified malignant neoplasm of lymph nodes of head, face and neck: Secondary | ICD-10-CM | POA: Diagnosis not present

## 2018-09-17 DIAGNOSIS — C801 Malignant (primary) neoplasm, unspecified: Secondary | ICD-10-CM | POA: Insufficient documentation

## 2018-09-17 LAB — GLUCOSE, CAPILLARY: Glucose-Capillary: 82 mg/dL (ref 70–99)

## 2018-09-17 MED ORDER — FLUDEOXYGLUCOSE F - 18 (FDG) INJECTION
12.5800 | Freq: Once | INTRAVENOUS | Status: AC | PRN
  Administered 2018-09-17: 13:00:00 12.58 via INTRAVENOUS

## 2018-09-18 ENCOUNTER — Other Ambulatory Visit: Payer: Self-pay | Admitting: Family

## 2018-09-19 NOTE — Progress Notes (Signed)
HEMATOLOGY/ONCOLOGY CLINIC NOTE  Date of Service: 09/19/2018  Patient Care Team: [name], NP as PCP - General (Internal Medicine)  CHIEF COMPLAINTS/PURPOSE OF CONSULTATION:  Bone lesions concerning for Multiple Myeloma  HISTORY OF PRESENTING ILLNESS:   [name] [name] is a wonderful 63 y.o. female who has been referred to [place] by Dr. [name] from [facility] for evaluation and management of multiple myeloma.The pt reports that she is doing well overall.   The pt reports that she saw Dr. Tonita Cong for shoulder pain and he wanted to order an MRI. She got a shot of cortisone and Dr. Tonita Cong called the next morning and said that she had bone lesions in her left shoulder. She has not had any issues with her left shoulder until a year and a half ago, and she thinks the pain is due to picking up her grandchild. She reports a new pain in her left hip/bottom that began within the past 6 months.  She had a bariatric surgery in 2005 and has been taking iron pills since then. She also takes a multivitamin, OTC Vit D, Vit B12, zinc, Vit A, and Vit C. She has also been on acid suppressants for reflux. She takes Allegra, Omeprazole, and Flonase for her allergies. She takes Xanax before bedtime for her anxiety.  She sees a Dr. At [facility] for interstitial cystitis. She feels more discomfort when she drinks soda. Normally, she drinks mostly water. She drinks one cup of coffee daily and rarely drinks alcohol. Pt has not had a cystoscopy. Denies vaginal bleeding or abnormal discharge.  Of note prior to the patient's visit today, pt has had 3D mammogram completed on 07/11/2018 with results revealing "breast density category B, no mammographic evidence of malignancy."   She also had a shoulder MRI wo contrast on 08/28/2018, which revealed "1. Multifocal small osseous lesions in the proximal humerus and a single similar lesion in the glenoid, highly suspicious for malignancy, such as  multiple myeloma or metastatic disease. 2. Full-thickness, full width teat of the suprespinatus tendon at the greater tuberosity with about 4cm tendon retrac ion. Mild supraspinatus muscle atrophy. 3. Mild diffuse subscapularis and infraspinatus tendinosis. Minimal interstitial tearing of the infraspinatus tendon. Moderate to sever diffuse infraspinatus msucle atrophy. 4. Moderate to severe diffuse atrophy of the teres minor muscle likely related to chronic denervation. No mass in the quadrilateral space. 5. 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 6. Mild lateral downsloping of the acromion and a mild subacromial enthesopathy. 7. Mild glenohumeral athrosis. 8. Degeneration and probable tear of the superior labrum. Associated small intraosseus ganglion and marrow edema in the adjacent glenoid."  Most recent lab results (09/04/2018) of CBC is as follows: all values are WNL. 09/04/2018 TSH at 1.03 22/48/2500 basal metabolic panel all WNL 37/05/8887 hepatic function panel all WNL 09/04/2018 lipid panel all WNL 09/04/2018 protein electrophoresis is pending  On review of systems, pt reports left shoulder pain, right hip/bottom pain and denies fever, chills, night sweats, unexpected weight loss, changes in bowel habits, and any other symptoms.   On PMHx the pt reports hx of 2 knee replacements, arthritis, allergies, anxiety On Social Hx the pt reports rare alcohol usage and has never smoked cigarettes On Family Hx the pt reports that her dad was diagnosed with prostate cancer at 62 y.o. and her brother was diagnosed with testicular cancer in his mid 27s-40s.   Interval History  [name] [name] is a 63 y.o. female presenting today  for the management and evaluation of bone lesions, possible MM. The patient's last visit with [place] was on 09/06/2018. The pt reports that she is doing well overall.  The pt reports that her left shoulder pain is still the same. She feels that the cortisone  shots are helping. Over the past 6 months, she has not lost weight or noticed any other symptoms or concerns.   Of note since the patient's last visit, pt has had PET skull base to thigh completed on 09/17/2018 with results revealing "1. Widespread hypermetabolic metastatic disease involving lymph nodes throughout the neck, chest, abdomen and pelvis as well as within lungs, liver, spleen, bones and soft tissues. Findings are suspicious for lymphoma. 2. Small right renal stone."  Most recent labs from 09/06/2018 are included under "laboratory data"  On review of systems, pt reports left shoulder pain and denies black or dark skin spots, belly pain, swallowing problems, breathing problems, and any other symptoms.   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   Elevated alkaline phosphatase level    negative workup (presumed secondary to fatty liver)   Frequency-urgency syndrome    GERD (gastroesophageal reflux disease)    Interstitial cystitis    Dr Vernie Shanks   Neuromuscular disorder [facility])    rt arm carpal tunnel syndrome   Obesity    status post bariatric procedure in [place] 2005   Osteoporosis    Thyroid nodule 7/10   `   Thyroid nodule    Varicose vein of leg     SURGICAL HISTORY: Past Surgical History:  Procedure Laterality Date   ANTERIOR CERVICAL DECOMP/DISCECTOMY FUSION N/A 06/21/2012   Procedure: ANTERIOR CERVICAL DECOMPRESSION/DISCECTOMY FUSION C-4 - C5 (SPACER/DePUY CERVICAL PLATE ONLY) 1 LEVEL;  Surgeon: Melina Schools, MD;  Location: [facility];  Service: Orthopedics;  Laterality: N/A;   BLADDER SURGERY  02/21/12 and 02/28/12   Dr Diona Fanti   COLPOSCOPY  1980   CRYOTHERAPY  1980   CYSTO WITH HYDRODISTENSION  12/12/2011   Procedure: CYSTOSCOPY/HYDRODISTENSION;  Surgeon: Franchot Gallo, MD;  Location: [facility];  Service:  Urology;  Laterality: N/A;  [place]   bilateral cataract extraction   FOOT SURGERY     bilateral bunionettes   FOOT SURGERY Right 01/07/14   Pt states surgery was due to arthritis. "Has pins in place now"   GASTRIC BYPASS  2005   JOINT REPLACEMENT  2008   left total knee   KNEE SURGERY  1997   left knee   NASAL SINUS SURGERY  7564   Dr Erik Obey   REFRACTIVE SURGERY  2006   lasik   SHOULDER SURGERY  2 2012   RIGHT    STOMACH SURGERY  2007   "tummy tuck"   TOTAL KNEE ARTHROPLASTY Right 12/31/2015   Procedure: RIGHT TOTAL KNEE ARTHROPLASTY;  Surgeon: [name], MD;  Location: [facility];  Service: Orthopedics;  Laterality: Right;  Adductor Block   VARICOSE VEIN SURGERY Right 02/2012   leg    SOCIAL HISTORY: Social History   Socioeconomic History   Marital status: Divorced    Spouse name: Not on file   Number of children: 1   Years of education: Not on file   Highest education level: Not on file  Occupational History   Occupation:  MANAGER    Employer: [employer] resource strain: Not on file   Food insecurity    Worry: Not on file    Inability: Not on file   Transportation needs    Medical: Not on file    Non-medical: Not on file  Tobacco Use   Smoking status: Never Smoker   Smokeless tobacco: Never Used  Substance and Sexual Activity   Alcohol use: Yes    Comment: RARE   Drug use: No   Sexual activity: Yes    Comment: 1st intercourse 62 yo-More than 5 partners  Lifestyle   Physical activity    Days per week: Not on file    Minutes per session: Not on file   Stress: Not on file  Relationships   Social connections    Talks on phone: Not on file    Gets together: Not on file    Attends religious service: Not on file    Active member of club or organization: Not on file    Attends meetings of clubs or organizations: Not on file    Relationship status: Not on file    Intimate partner violence    Fear of current or ex partner: Not on file    Emotionally abused: Not on file    Physically abused: Not on file    Forced sexual activity: Not on file  Other Topics Concern   Not on file  Social History Narrative   Not on file    FAMILY HISTORY: Family History  Problem Relation Age of Onset   Diabetes Paternal Grandfather    Stroke Paternal Grandfather    Hyperlipidemia Mother    Hypertension Mother    Hypertension Father    Diabetes Father    Prostate cancer Father    Dementia Father    Heart disease Maternal Grandmother    Hypertension Maternal Grandmother    Heart disease Maternal Grandfather    Hypertension Maternal Grandfather    Stroke Maternal Grandfather    Arthritis Brother    Testicular cancer Brother    Colon cancer Neg Hx    Esophageal cancer Neg Hx    Pancreatic cancer Neg Hx    Stomach cancer Neg Hx     ALLERGIES:  is allergic to ibuprofen and nitrofurantoin.  MEDICATIONS:  Current Outpatient Medications  Medication Sig Dispense Refill   acetaminophen (TYLENOL) 650 MG CR tablet Take 2 tablets (1,300 mg total) by mouth every 8 (eight) hours as needed for pain. Take for mild pain. Do not combine with Percocet. Do not exceed daily recommended limit of Tylenol.     ALPRAZolam (XANAX) 0.5 MG tablet TAKE 1 TABLET (0.5 MG TOTAL) BY MOUTH 2 (TWO) TIMES DAILY. 60 tablet 1   amitriptyline (ELAVIL) 50 MG tablet TAKE 1 TABLET BY MOUTH EVERY EVENING  3   azelastine (ASTELIN) 0.1 % nasal spray Place 1 spray into both nostrils 2 (two) times daily. Use in each nostril as directed 30 mL 12   BIOTIN PO Take by mouth.     Calcium Carbonate Antacid (TUMS ULTRA 1000 PO) Take 1,000 mg by mouth 2 (two) times daily.     Cholecalciferol (VITAMIN D-1000 MAX ST) 25 MCG (1000 UT) tablet Take 1 tablet (1,000 Units total) by mouth daily.     cycloSPORINE (RESTASIS) 0.05 % ophthalmic emulsion Place 1 drop into both eyes 2 (two)  times daily. 0.4 mL    Ferrous Sulfate (IRON) 325 (65  Fe) MG TABS Take 1 tablet (325 mg total) by mouth daily. 30 each 0   Fexofenadine HCl (ALLEGRA PO) Take by mouth.     fluticasone (FLONASE) 50 MCG/ACT nasal spray Place 2 sprays into both nostrils daily. 16 g 6   Lifitegrast (XIIDRA) 5 % SOLN Xiidra 5 % eye drops in a dropperette  INSTILL 1 DROP INTO BOTH EYES TWICE A DAY AS DIRECTED     Multiple Vitamin (MULTIVITAMIN) tablet Take 1 tablet 2 (two) times daily by mouth.     multivitamin-lutein (OCUVITE-LUTEIN) CAPS capsule Take 1 capsule daily by mouth.     MYRBETRIQ 50 MG TB24 tablet Take 50 mg by mouth every evening.   2   Omega-3 Fatty Acids (FISH OIL PO) Take 2 (two) times daily by mouth.     omeprazole (PRILOSEC) 40 MG capsule TAKE 1 CAPSULE BY MOUTH EVERY DAY 90 capsule 0   OVER THE COUNTER MEDICATION Place 1 drop into both eyes 2 (two) times daily. Allergy eye drops     PARoxetine (PAXIL) 30 MG tablet Take 1 tablet (30 mg total) by mouth every morning. 90 tablet 1   trimethoprim (TRIMPEX) 100 MG tablet Take 1 tablet (100 mg total) by mouth daily.     valACYclovir (VALTREX) 1000 MG tablet Take 0.5 tablets (500 mg total) by mouth daily. 30 tablet 11   VITAMIN A PO Take by mouth.     No current facility-administered medications for this visit.     REVIEW OF SYSTEMS:    A 10+ POINT REVIEW OF SYSTEMS WAS OBTAINED including neurology, dermatology, psychiatry, cardiac, respiratory, lymph, extremities, GI, GU, Musculoskeletal, constitutional, breasts, reproductive, HEENT.  All pertinent positives are noted in the HPI.  All others are negative.    PHYSICAL EXAMINATION: ECOG PERFORMANCE STATUS: 1 - Symptomatic but completely ambulatory  . There were no vitals filed for this visit. There were no vitals filed for this visit. .There is no height or weight on file to calculate BMI.  GENERAL:alert, in no acute distress and comfortable SKIN: no acute rashes, no significant  lesions EYES: conjunctiva are pink and non-injected, sclera anicteric OROPHARYNX: MMM, no exudates, no oropharyngeal erythema or ulceration NECK: supple, no JVD LYMPH:  no palpable lymphadenopathy in the cervical, axillary or inguinal regions LUNGS: clear to auscultation b/l with normal respiratory effort HEART: regular rate & rhythm ABDOMEN:  normoactive bowel sounds , non tender, not distended. No palpable hepatosplenomegaly.  Extremity: no pedal edema PSYCH: alert & oriented x 3 with fluent speech NEURO: no focal motor/sensory deficits   LABORATORY DATA:  I have reviewed the data as listed . CBC Latest Ref Rng & Units 09/04/2018 10/25/2017 09/15/2017  WBC 4.0 - 10.5 K/uL 4.7 4.5 4.7  Hemoglobin 12.0 - 15.0 g/dL 12.9 12.7 12.4  Hematocrit 36.0 - 46.0 % 39.5 38.1 37.3  Platelets 150.0 - 400.0 K/uL 270.0 267.0 250.0   . CMP Latest Ref Rng & Units 09/04/2018 09/04/2018 09/15/2017  Glucose 70 - 99 mg/dL - 95 120(H)  BUN 6 - 23 mg/dL - 18 16  Creatinine 0.40 - 1.20 mg/dL - 0.64 0.54  Sodium 135 - 145 mEq/L - 139 142  Potassium 3.5 - 5.1 mEq/L - 4.3 4.2  Chloride 96 - 112 mEq/L - 101 108  CO2 19 - 32 mEq/L - 30 28  Calcium 8.4 - 10.5 mg/dL - 9.1 8.9  Total Protein 6.1 - 8.1 g/dL 6.7 6.7 6.6  Total Bilirubin 0.2 - 1.2 mg/dL - 0.4 0.4  Alkaline Phos 39 - 117 U/L - 110 87  AST 0 - 37 U/L - 21 14  ALT 0 - 35 U/L - 18 15   09/06/2018 LABS: Component     Latest Ref Rng & Units 09/06/2018  IgG (Immunoglobin G), Serum     586 - 1,602 mg/dL 1,254  IgA     87 - 352 mg/dL 377 (H)  IgM (Immunoglobulin M), Srm     26 - 217 mg/dL 12 (L)  Total Protein ELP     6.0 - 8.5 g/dL 7.1  Albumin SerPl Elph-Mcnc     2.9 - 4.4 g/dL 4.1  Alpha 1     0.0 - 0.4 g/dL 0.2  Alpha2 Glob SerPl Elph-Mcnc     0.4 - 1.0 g/dL 0.8  B-Globulin SerPl Elph-Mcnc     0.7 - 1.3 g/dL 0.9  Gamma Glob SerPl Elph-Mcnc     0.4 - 1.8 g/dL 1.1  M Protein SerPl Elph-Mcnc     Not Observed g/dL Not Observed  Globulin,  Total     2.2 - 3.9 g/dL 3.0  Albumin/Glob SerPl     0.7 - 1.7 1.4  IFE 1      Comment  Please Note (HCV):      Comment  Kappa free light chain     3.3 - 19.4 mg/L 19.2  Lamda free light chains     5.7 - 26.3 mg/L 21.2  Kappa, lamda light chain ratio     0.26 - 1.65 0.91  Beta-2 Microglobulin     0.6 - 2.4 mg/L 2.4  LDH     98 - 192 U/L 224 (H)  Sed Rate     0 - 22 mm/hr 19  Vitamin D, 25-Hydroxy     30.0 - 100.0 ng/mL 23.5 (L)  Vitamin B12     180 - 914 pg/mL 1,415 (H)  Ferritin     11 - 307 ng/mL 241    RADIOGRAPHIC STUDIES: I have personally reviewed the radiological images as listed and agreed with the findings in the report. Nm Pet Image Initial (pi) Skull Base To Thigh  Result Date: 09/17/2018 CLINICAL DATA:  Initial treatment strategy for bone lesion, possible unknown primary malignancy. EXAM: NUCLEAR MEDICINE PET SKULL BASE TO THIGH TECHNIQUE: 12.6 mCi F-18 FDG was injected intravenously. Full-ring PET imaging was performed from the skull base to thigh after the radiotracer. CT data was obtained and used for attenuation correction and anatomic localization. Fasting blood glucose: 82 mg/dl COMPARISON:  None. FINDINGS: Mediastinal blood pool activity: SUV max 3.3 Liver activity: SUV max NA NECK: Hypermetabolic lymph nodes in the neck are seen within index posterior left lymph node measuring 4 mm (CT image 26) with an SUV max of 7.9. Incidental CT findings: None. CHEST: Hypermetabolic adenopathy is seen throughout the low internal jugular stations, mediastinum and both hilar regions. Index cluster of low right paratracheal lymph nodes has an SUV max of 19.1. Right hilar adenopathy has an SUV max of 23.6. Hypermetabolic adenopathy extends along the course of the esophagus and is seen in the prepericardiac regions. There are intramuscular and subcutaneous hypermetabolic nodules as well. Subcentimeter nodules overlying the proximal left humerus have an SUV max of 2.6. Multiple  hypermetabolic nodules in the lungs seen in the hematogenous distribution. Incidental CT findings: Coronary artery calcification. No pericardial or pleural effusion. ABDOMEN/PELVIS: Hypermetabolic lesions are seen in the liver with an index lesion adjacent to the gallbladder fossa, measuring 10 mm (CT image 101).  No abnormal hypermetabolism in the adrenal glands or pancreas. There are areas of hypermetabolism in the spleen, with an SUV max of 7.2 medially. Extensive hypermetabolic adenopathy throughout the abdomen and pelvis. Periportal nodal mass measures 2.6 cm (CT image 109) with an SUV max 14.7. Aortocaval lymph node measures 10 mm (CT image 126) with an SUV max of 6.4. Multiple hypermetabolic mesenteric lymph nodes are seen with an index ileocolic lymph node measuring 11 mm (143) and an SUV max of 12.6. Pelvic retroperitoneal and inguinal adenopathy is hypermetabolic with an index right external iliac lymph node measuring 1.4 cm (165) with an SUV max of 16.9. Incidental CT findings: Liver, gallbladder, adrenal glands otherwise unremarkable. Small stone in the right kidney. Kidneys, spleen and pancreas are otherwise unremarkable. Postoperative changes in the stomach and proximal small bowel. Bowel is otherwise unremarkable. SKELETON: Hypermetabolic lesions are seen throughout osseous structures. Index lesion in L1 has an SUV max of 14.2. Incidental CT findings: None. IMPRESSION: 1. Widespread hypermetabolic metastatic disease involving lymph nodes throughout the neck, chest, abdomen and pelvis as well as within lungs, liver, spleen, bones and soft tissues. Findings are suspicious for lymphoma. 2. Small right renal stone. Electronically Signed   By: Lorin Picket M.D.   On: 09/17/2018 16:03     08/28/2018 Shoulder MRI without contrast:  "1. Multifocal small osseous lesions in the proximal humerus and a single similar lesion in the glenoid, highly suspicious for malignancy, such as multiple myeloma or  metastatic disease. 2. Full-thickness, full width teat of the suprespinatus tendon at the greater tuberosity with about 4cm tendon retrac ion. Mild supraspinatus muscle atrophy. 3. Mild diffuse subscapularis and infraspinatus tendinosis. Minimal interstitial tearing of the infraspinatus tendon. Moderate to sever diffuse infraspinatus msucle atrophy. 4. Moderate to severe diffuse atrophy of the teres minor muscle likely related to chronic denervation. No mass in the quadrilateral space. 5. 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 6. Mild lateral downsloping of the acromion and a mild subacromial enthesopathy. 7. Mild glenohumeral athrosis. 8. Degeneration and probable tear of the superior labrum. Associated small intraosseus ganglion and marrow edema in the adjacent glenoid."  ASSESSMENT & PLAN:   1. Bone metastases with unknown primary noted on MRI shoulder  09/17/2018 PET skull base to thigh revealed "1. Widespread hypermetabolic metastatic disease involving lymph nodes throughout the neck, chest, abdomen and pelvis as well as within lungs, liver, spleen, bones and soft tissues. Findings are suspicious for lymphoma. 2. Small right renal stone."  PLAN: -Discussed 09/17/2018 PET skull base to thigh which revealed extensive lymphadenopathy in the neck, chest, abdomen, spine, liver, spleen, and lungs. -Discussed that her blood counts, blood chemistries, and MMP were not consistent with MM -Discussed that the results of her PET scan are most suggestive of a lymphoma, but a LN biopsy is needed to confirm and r/o other possibilities. -Schedule an U/S guided LN Bx of right cervical lymph node in the next week   -[place] core biopsy of neck lymph node ASAP tomorrow or Monday -RTC with Dr Irene Limbo 3-4 days after US guided needle biopsy   Orders Placed This Encounter  Procedures   [place] CORE BIOPSY (LYMPH NODES)    Standing Status:   Future    Standing Expiration Date:   11/20/2019    Order  Specific Question:   Lab orders requested (DO NOT place separate lab orders, these will be automatically ordered during procedure specimen collection):    Answer:   Surgical Pathology    Comments:  flow cytometry, FISH studies and molecular studies as needed    Order Specific Question:   Reason for Exam (SYMPTOM  OR DIAGNOSIS REQUIRED)    Answer:   Rt base of neck lymph node US guided core needle biopsy for likely lymphoma- extensive lymphadenopathy, lung, liver ,spleen and bone involvement    Order Specific Question:   Preferred location?    Answer:   [facility]    All of the patients questions were answered with apparent satisfaction. The patient knows to call the clinic with any problems, questions or concerns.  The total time spent in the appt was 25 minutes and more than 50% was on counseling and direct patient cares.  Sullivan Lone MD MS AAHIVMS [facility] Hematology/Oncology Physician [facility]  (Office):       [phone] (Work cell):  [phone] (Fax):           [phone]  09/19/2018 9:07 PM  I, De Burrs, am acting as a scribe for Dr. Irene Limbo  .I have reviewed the above documentation for accuracy and completeness, and I agree with the above. [name] MD

## 2018-09-20 ENCOUNTER — Inpatient Hospital Stay: Payer: BC Managed Care – PPO | Attending: Hematology | Admitting: Hematology

## 2018-09-20 ENCOUNTER — Other Ambulatory Visit: Payer: Self-pay

## 2018-09-20 VITALS — BP 140/85 | HR 94 | Temp 99.1°F | Resp 18 | Ht 64.5 in | Wt 240.4 lb

## 2018-09-20 DIAGNOSIS — M25512 Pain in left shoulder: Secondary | ICD-10-CM | POA: Insufficient documentation

## 2018-09-20 DIAGNOSIS — M25552 Pain in left hip: Secondary | ICD-10-CM | POA: Insufficient documentation

## 2018-09-20 DIAGNOSIS — R922 Inconclusive mammogram: Secondary | ICD-10-CM | POA: Insufficient documentation

## 2018-09-20 DIAGNOSIS — N200 Calculus of kidney: Secondary | ICD-10-CM | POA: Insufficient documentation

## 2018-09-20 DIAGNOSIS — Z79899 Other long term (current) drug therapy: Secondary | ICD-10-CM | POA: Insufficient documentation

## 2018-09-20 DIAGNOSIS — Z881 Allergy status to other antibiotic agents status: Secondary | ICD-10-CM | POA: Diagnosis not present

## 2018-09-20 DIAGNOSIS — Z8042 Family history of malignant neoplasm of prostate: Secondary | ICD-10-CM | POA: Insufficient documentation

## 2018-09-20 DIAGNOSIS — R918 Other nonspecific abnormal finding of lung field: Secondary | ICD-10-CM | POA: Diagnosis not present

## 2018-09-20 DIAGNOSIS — Z823 Family history of stroke: Secondary | ICD-10-CM | POA: Diagnosis not present

## 2018-09-20 DIAGNOSIS — Z9884 Bariatric surgery status: Secondary | ICD-10-CM | POA: Insufficient documentation

## 2018-09-20 DIAGNOSIS — R590 Localized enlarged lymph nodes: Secondary | ICD-10-CM | POA: Insufficient documentation

## 2018-09-20 DIAGNOSIS — Z8249 Family history of ischemic heart disease and other diseases of the circulatory system: Secondary | ICD-10-CM | POA: Diagnosis not present

## 2018-09-20 DIAGNOSIS — Z8261 Family history of arthritis: Secondary | ICD-10-CM | POA: Insufficient documentation

## 2018-09-20 DIAGNOSIS — R591 Generalized enlarged lymph nodes: Secondary | ICD-10-CM | POA: Diagnosis not present

## 2018-09-20 DIAGNOSIS — F419 Anxiety disorder, unspecified: Secondary | ICD-10-CM | POA: Insufficient documentation

## 2018-09-20 DIAGNOSIS — R748 Abnormal levels of other serum enzymes: Secondary | ICD-10-CM | POA: Insufficient documentation

## 2018-09-20 DIAGNOSIS — C7951 Secondary malignant neoplasm of bone: Secondary | ICD-10-CM | POA: Insufficient documentation

## 2018-09-20 DIAGNOSIS — C787 Secondary malignant neoplasm of liver and intrahepatic bile duct: Secondary | ICD-10-CM | POA: Diagnosis not present

## 2018-09-20 DIAGNOSIS — M25551 Pain in right hip: Secondary | ICD-10-CM | POA: Diagnosis not present

## 2018-09-20 DIAGNOSIS — Z886 Allergy status to analgesic agent status: Secondary | ICD-10-CM | POA: Diagnosis not present

## 2018-09-20 DIAGNOSIS — Z833 Family history of diabetes mellitus: Secondary | ICD-10-CM | POA: Insufficient documentation

## 2018-09-20 DIAGNOSIS — Z83438 Family history of other disorder of lipoprotein metabolism and other lipidemia: Secondary | ICD-10-CM | POA: Insufficient documentation

## 2018-09-20 DIAGNOSIS — Z8043 Family history of malignant neoplasm of testis: Secondary | ICD-10-CM | POA: Insufficient documentation

## 2018-09-20 DIAGNOSIS — K219 Gastro-esophageal reflux disease without esophagitis: Secondary | ICD-10-CM | POA: Diagnosis not present

## 2018-09-20 DIAGNOSIS — C9000 Multiple myeloma not having achieved remission: Secondary | ICD-10-CM | POA: Diagnosis not present

## 2018-09-20 DIAGNOSIS — M6250 Muscle wasting and atrophy, not elsewhere classified, unspecified site: Secondary | ICD-10-CM | POA: Diagnosis not present

## 2018-09-20 DIAGNOSIS — C8598 Non-Hodgkin lymphoma, unspecified, lymph nodes of multiple sites: Secondary | ICD-10-CM | POA: Diagnosis not present

## 2018-09-24 ENCOUNTER — Telehealth: Payer: Self-pay | Admitting: Hematology

## 2018-09-24 ENCOUNTER — Encounter: Payer: Self-pay | Admitting: Family

## 2018-09-24 NOTE — Telephone Encounter (Signed)
I talk with patient regarding 8/20

## 2018-09-25 MED ORDER — PAROXETINE HCL 40 MG PO TABS: 40.0000 mg | ORAL_TABLET | ORAL | 2 refills | Status: DC

## 2018-09-27 ENCOUNTER — Other Ambulatory Visit: Payer: Self-pay | Admitting: Radiology

## 2018-09-27 ENCOUNTER — Telehealth: Payer: Self-pay | Admitting: *Deleted

## 2018-09-27 MED ORDER — FLUCONAZOLE 150 MG PO TABS: 150.0000 mg | ORAL_TABLET | Freq: Once | ORAL | 0 refills | Status: AC

## 2018-09-27 NOTE — Telephone Encounter (Signed)
[facility] requesting refill for Diflucan.  Do you want to see her?

## 2018-09-28 ENCOUNTER — Other Ambulatory Visit: Payer: Self-pay

## 2018-09-28 ENCOUNTER — Ambulatory Visit (HOSPITAL_COMMUNITY)
Admission: RE | Admit: 2018-09-28 | Discharge: 2018-09-28 | Disposition: A | Discharge status: Home / Self Care | Payer: BC Managed Care – PPO | Source: Ambulatory Visit | Attending: Hematology | Admitting: Hematology

## 2018-09-28 ENCOUNTER — Encounter (HOSPITAL_COMMUNITY): Payer: Self-pay

## 2018-09-28 DIAGNOSIS — D649 Anemia, unspecified: Secondary | ICD-10-CM | POA: Insufficient documentation

## 2018-09-28 DIAGNOSIS — G709 Myoneural disorder, unspecified: Secondary | ICD-10-CM | POA: Insufficient documentation

## 2018-09-28 DIAGNOSIS — E041 Nontoxic single thyroid nodule: Secondary | ICD-10-CM | POA: Insufficient documentation

## 2018-09-28 DIAGNOSIS — G5601 Carpal tunnel syndrome, right upper limb: Secondary | ICD-10-CM | POA: Diagnosis not present

## 2018-09-28 DIAGNOSIS — F419 Anxiety disorder, unspecified: Secondary | ICD-10-CM | POA: Diagnosis not present

## 2018-09-28 DIAGNOSIS — C8598 Non-Hodgkin lymphoma, unspecified, lymph nodes of multiple sites: Secondary | ICD-10-CM | POA: Diagnosis not present

## 2018-09-28 DIAGNOSIS — R591 Generalized enlarged lymph nodes: Secondary | ICD-10-CM | POA: Insufficient documentation

## 2018-09-28 DIAGNOSIS — Q899 Congenital malformation, unspecified: Secondary | ICD-10-CM | POA: Insufficient documentation

## 2018-09-28 DIAGNOSIS — M810 Age-related osteoporosis without current pathological fracture: Secondary | ICD-10-CM | POA: Insufficient documentation

## 2018-09-28 DIAGNOSIS — I888 Other nonspecific lymphadenitis: Secondary | ICD-10-CM | POA: Diagnosis not present

## 2018-09-28 DIAGNOSIS — I8390 Asymptomatic varicose veins of unspecified lower extremity: Secondary | ICD-10-CM | POA: Diagnosis not present

## 2018-09-28 DIAGNOSIS — Z79899 Other long term (current) drug therapy: Secondary | ICD-10-CM | POA: Insufficient documentation

## 2018-09-28 DIAGNOSIS — R590 Localized enlarged lymph nodes: Secondary | ICD-10-CM | POA: Diagnosis not present

## 2018-09-28 DIAGNOSIS — K219 Gastro-esophageal reflux disease without esophagitis: Secondary | ICD-10-CM | POA: Insufficient documentation

## 2018-09-28 LAB — PROTIME-INR
INR: 1.1 (ref 0.8–1.2)
Prothrombin Time: 13.6 seconds (ref 11.4–15.2)

## 2018-09-28 LAB — CBC
HCT: 39.7 % (ref 36.0–46.0)
Hemoglobin: 12.8 g/dL (ref 12.0–15.0)
MCH: 29.8 pg (ref 26.0–34.0)
MCHC: 32.2 g/dL (ref 30.0–36.0)
MCV: 92.5 fL (ref 80.0–100.0)
Platelets: 279 10*3/uL (ref 150–400)
RBC: 4.29 MIL/uL (ref 3.87–5.11)
RDW: 13.4 % (ref 11.5–15.5)
WBC: 4 10*3/uL (ref 4.0–10.5)
nRBC: 0 % (ref 0.0–0.2)

## 2018-09-28 MED ORDER — MIDAZOLAM HCL 2 MG/2ML IJ SOLN
INTRAMUSCULAR | Status: AC
  Filled 2018-09-28: qty 2

## 2018-09-28 MED ORDER — SODIUM CHLORIDE 0.9 % IV SOLN: INTRAVENOUS | Status: DC

## 2018-09-28 MED ORDER — FENTANYL CITRATE (PF) 100 MCG/2ML IJ SOLN
INTRAMUSCULAR | Status: AC | PRN
  Administered 2018-09-28: 50 ug via INTRAVENOUS
  Administered 2018-09-28 (×2): 25 ug via INTRAVENOUS

## 2018-09-28 MED ORDER — MIDAZOLAM HCL 2 MG/2ML IJ SOLN
INTRAMUSCULAR | Status: AC | PRN
  Administered 2018-09-28 (×2): 1 mg via INTRAVENOUS

## 2018-09-28 MED ORDER — LIDOCAINE HCL (PF) 1 % IJ SOLN
INTRAMUSCULAR | Status: AC
  Filled 2018-09-28: qty 30

## 2018-09-28 MED ORDER — FENTANYL CITRATE (PF) 100 MCG/2ML IJ SOLN
INTRAMUSCULAR | Status: AC
  Filled 2018-09-28: qty 2

## 2018-09-28 NOTE — Procedures (Signed)
Interventional Radiology Procedure:   Indications: Diffuse lymphadenopathy  Procedure: US guided core biopsy of right cervical lymph node  Findings: 6 cores from right cervical lymph node  Complications: none     EBL: less than 10 ml  Plan: Discharge to home.   [name] [name] R. Anselm Pancoast, MD  Pager: [phone]

## 2018-09-28 NOTE — Progress Notes (Signed)
Discharge instructions reviewed with pt and her sig other Tom. Both voice understanding

## 2018-09-28 NOTE — H&P (Addendum)
Chief Complaint: Patient was seen in consultation today for Right supraclavicular/cervical Lymph node biopsy at the request of [name] [name]  Referring Physician(s): [name] [name]  Supervising Physician: Markus Daft  Patient Status: [facility] - Out-pt  History of Present Illness: [name] [name] is a 63 y.o. female   While having MR for shoulder pain in July-- Discovery of bony lesions Was referred to Dr [name] [name] and further work up  PET 09/17/18: IMPRESSION: 1. Widespread hypermetabolic metastatic disease involving lymph nodes throughout the neck, chest, abdomen and pelvis as well as within lungs, liver, spleen, bones and soft tissues. Findings are suspicious for lymphoma. 2. Small right renal stone.  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   Elevated alkaline phosphatase level    negative workup (presumed secondary to fatty liver)   Frequency-urgency syndrome    GERD (gastroesophageal reflux disease)    Interstitial cystitis    Dr [name] [name]   Neuromuscular disorder [facility])    rt arm carpal tunnel syndrome   Obesity    status post bariatric procedure in [place] 2005   Osteoporosis    Thyroid nodule 7/10   `   Thyroid nodule    Varicose vein of leg     Past Surgical History:  Procedure Laterality Date   ANTERIOR CERVICAL DECOMP/DISCECTOMY FUSION N/A 06/21/2012   Procedure: ANTERIOR CERVICAL DECOMPRESSION/DISCECTOMY FUSION C-4 - C5 (SPACER/DePUY CERVICAL PLATE ONLY) 1 LEVEL;  Surgeon: Melina Schools, MD;  Location: [place];  Service: Orthopedics;  Laterality: N/A;   BLADDER SURGERY  02/21/12 and 02/28/12   Dr [name] [name]   COLPOSCOPY  1980   CRYOTHERAPY  1980   CYSTO WITH HYDRODISTENSION  12/12/2011   Procedure: CYSTOSCOPY/HYDRODISTENSION;  Surgeon: [name] Gallo, MD;  Location:  [facility];  Service: Urology;  Laterality: N/A;  [place]   bilateral cataract extraction   FOOT SURGERY     bilateral bunionettes   FOOT SURGERY Right 01/07/14   Pt states surgery was due to arthritis. "Has pins in place now"   GASTRIC BYPASS  2005   JOINT REPLACEMENT  2008   left total knee   KNEE SURGERY  1997   left knee   NASAL SINUS SURGERY  7412   Dr Erik Obey   REFRACTIVE SURGERY  2006   lasik   SHOULDER SURGERY  2 2012   RIGHT    STOMACH SURGERY  2007   "tummy tuck"   TOTAL KNEE ARTHROPLASTY Right 12/31/2015   Procedure: RIGHT TOTAL KNEE ARTHROPLASTY;  Surgeon: [name], MD;  Location: [facility];  Service: Orthopedics;  Laterality: Right;  Adductor Block   VARICOSE VEIN SURGERY Right 02/2012   leg    Allergies: Nitrofurantoin  Medications: Prior to Admission medications   Medication Sig Start Date End Date Taking? Authorizing Provider  acetaminophen (TYLENOL) 650 MG CR tablet Take 2 tablets (1,300 mg total) by mouth every 8 (eight) hours as needed for pain. Take for mild pain. Do not combine with Percocet. Do not exceed daily recommended limit of Tylenol. 01/01/16  Yes Bissell, Ellison Hughs M, PA-C  ALPRAZolam (XANAX) 0.5 MG tablet TAKE 1 TABLET (0.5 MG TOTAL) BY MOUTH 2 (TWO) TIMES DAILY. Patient taking differently: Take 0.5 mg by mouth 2 (two) times daily as  needed for anxiety.  08/17/18  Yes [name], NP  amitriptyline (ELAVIL) 50 MG tablet TAKE 1 TABLET BY MOUTH EVERY EVENING 12/30/14  Yes [provider]  azelastine (ASTELIN) 0.1 % nasal spray Place 1 spray into both nostrils 2 (two) times daily. Use in each nostril as directed Patient taking differently: Place 1 spray into both nostrils 2 (two) times daily as needed for allergies. Use in each nostril as directed 06/04/18  Yes [name], NP  Cholecalciferol (VITAMIN D-1000 MAX ST) 25 MCG (1000 UT) tablet Take 1 tablet (1,000 Units total) by mouth  daily. 06/04/18  Yes [name], NP  Ferrous Sulfate (IRON) 325 (65 Fe) MG TABS Take 1 tablet (325 mg total) by mouth daily. 06/04/18  Yes [name], NP  Fexofenadine HCl (ALLEGRA PO) Take 180 mg by mouth daily.    Yes [provider]  fluticasone (FLONASE) 50 MCG/ACT nasal spray Place 2 sprays into both nostrils daily. 10/25/17  Yes [name], NP  Lifitegrast Shirley Friar) 5 % SOLN Place 1 drop into both eyes 2 (two) times daily.    Yes [provider]  Multiple Vitamin (MULTIVITAMIN) tablet Take 1 tablet by mouth daily.    Yes [provider]  multivitamin-lutein (OCUVITE-LUTEIN) CAPS capsule Take 1 capsule daily by mouth.   Yes [provider]  MYRBETRIQ 50 MG TB24 tablet Take 50 mg by mouth every evening.  07/24/15  Yes [provider]  omeprazole (PRILOSEC) 40 MG capsule TAKE 1 CAPSULE BY MOUTH EVERY DAY 08/13/18  Yes [name], NP  OVER THE COUNTER MEDICATION Place 1 drop into both eyes 2 (two) times daily. Allergy eye drops   Yes [provider]  PARoxetine (PAXIL) 40 MG tablet Take 1 tablet (40 mg total) by mouth every morning. 09/25/18  Yes [name], NP  valACYclovir (VALTREX) 1000 MG tablet Take 0.5 tablets (500 mg total) by mouth daily. 12/21/17  Yes Saguier, Percell Miller, PA-C  VITAMIN A PO Take 1 capsule by mouth daily.    Yes [provider]  vitamin C (ASCORBIC ACID) 500 MG tablet Take 500 mg by mouth daily.   Yes [provider]  zinc gluconate 50 MG tablet Take 50 mg by mouth daily.   Yes [provider]     Family History  Problem Relation Age of Onset   Diabetes Paternal Grandfather    Stroke Paternal Grandfather    Hyperlipidemia Mother    Hypertension Mother    Hypertension Father    Diabetes Father    Prostate cancer Father    Dementia Father    Heart disease Maternal Grandmother    Hypertension Maternal Grandmother    Heart disease Maternal  Grandfather    Hypertension Maternal Grandfather    Stroke Maternal Grandfather    Arthritis Brother    Testicular cancer Brother    Colon cancer Neg Hx    Esophageal cancer Neg Hx    Pancreatic cancer Neg Hx    Stomach cancer Neg Hx     Social History   Socioeconomic History   Marital status: Divorced    Spouse name: Not on file   Number of children: 1   Years of education: Not on file   Highest education level: Not on file  Occupational History   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[employer] resource strain: Not on file   Food insecurity    Worry: Not on file  Inability: Not on file   Transportation needs    Medical: Not on file    Non-medical: Not on file  Tobacco Use   Smoking status: Never Smoker   Smokeless tobacco: Never Used  Substance and Sexual Activity   Alcohol use: Yes    Comment: RARE   Drug use: No   Sexual activity: Yes    Comment: 1st intercourse 63 yo-More than 5 partners  Lifestyle   Physical activity    Days per week: Not on file    Minutes per session: Not on file   Stress: Not on file  Relationships   Social connections    Talks on phone: Not on file    Gets together: Not on file    Attends religious service: Not on file    Active member of club or organization: Not on file    Attends meetings of clubs or organizations: Not on file    Relationship status: Not on file  Other Topics Concern   Not on file  Social History Narrative   Not on file    Review of Systems: A 12 point ROS discussed and pertinent positives are indicated in the HPI above.  All other systems are negative.  Review of Systems  Constitutional: Negative for activity change, fatigue and fever.  Respiratory: Negative for cough and shortness of breath.   Cardiovascular: Negative for chest pain.  Gastrointestinal: Negative for abdominal distention.  Musculoskeletal: Positive for arthralgias. Negative for  back pain.  Neurological: Negative for weakness.  Psychiatric/Behavioral: Negative for behavioral problems and confusion.    Vital Signs: BP (!) 163/89    Pulse 92    Temp 98.7 F (37.1 C) (Oral)    Resp 18    Ht 5' 4.5" (1.638 m)    Wt 238 lb (108 kg)    SpO2 97%    BMI 40.22 kg/m   Physical Exam Vitals signs reviewed.  Cardiovascular:     Rate and Rhythm: Normal rate and regular rhythm.     Heart sounds: Normal heart sounds.  Pulmonary:     Effort: Pulmonary effort is normal.     Breath sounds: Normal breath sounds.  Abdominal:     Palpations: Abdomen is soft.  Musculoskeletal: Normal range of motion.  Skin:    General: Skin is warm and dry.  Neurological:     Mental Status: She is alert and oriented to person, place, and time.  Psychiatric:        Mood and Affect: Mood normal.        Behavior: Behavior normal.        Thought Content: Thought content normal.        Judgment: Judgment normal.     Imaging: Nm Pet Image Initial (pi) Skull Base To Thigh  Result Date: 09/17/2018 CLINICAL DATA:  Initial treatment strategy for bone lesion, possible unknown primary malignancy. EXAM: NUCLEAR MEDICINE PET SKULL BASE TO THIGH TECHNIQUE: 12.6 mCi F-18 FDG was injected intravenously. Full-ring PET imaging was performed from the skull base to thigh after the radiotracer. CT data was obtained and used for attenuation correction and anatomic localization. Fasting blood glucose: 82 mg/dl COMPARISON:  None. FINDINGS: Mediastinal blood pool activity: SUV max 3.3 Liver activity: SUV max NA NECK: Hypermetabolic lymph nodes in the neck are seen within index posterior left lymph node measuring 4 mm (CT image 26) with an SUV max of 7.9. Incidental CT findings: None. CHEST: Hypermetabolic adenopathy is seen throughout the low internal jugular stations,  mediastinum and both hilar regions. Index cluster of low right paratracheal lymph nodes has an SUV max of 19.1. Right hilar adenopathy has an SUV max of  23.6. Hypermetabolic adenopathy extends along the course of the esophagus and is seen in the prepericardiac regions. There are intramuscular and subcutaneous hypermetabolic nodules as well. Subcentimeter nodules overlying the proximal left humerus have an SUV max of 2.6. Multiple hypermetabolic nodules in the lungs seen in the hematogenous distribution. Incidental CT findings: Coronary artery calcification. No pericardial or pleural effusion. ABDOMEN/PELVIS: Hypermetabolic lesions are seen in the liver with an index lesion adjacent to the gallbladder fossa, measuring 10 mm (CT image 101). No abnormal hypermetabolism in the adrenal glands or pancreas. There are areas of hypermetabolism in the spleen, with an SUV max of 7.2 medially. Extensive hypermetabolic adenopathy throughout the abdomen and pelvis. Periportal nodal mass measures 2.6 cm (CT image 109) with an SUV max 14.7. Aortocaval lymph node measures 10 mm (CT image 126) with an SUV max of 6.4. Multiple hypermetabolic mesenteric lymph nodes are seen with an index ileocolic lymph node measuring 11 mm (143) and an SUV max of 12.6. Pelvic retroperitoneal and inguinal adenopathy is hypermetabolic with an index right external iliac lymph node measuring 1.4 cm (165) with an SUV max of 16.9. Incidental CT findings: Liver, gallbladder, adrenal glands otherwise unremarkable. Small stone in the right kidney. Kidneys, spleen and pancreas are otherwise unremarkable. Postoperative changes in the stomach and proximal small bowel. Bowel is otherwise unremarkable. SKELETON: Hypermetabolic lesions are seen throughout osseous structures. Index lesion in L1 has an SUV max of 14.2. Incidental CT findings: None. IMPRESSION: 1. Widespread hypermetabolic metastatic disease involving lymph nodes throughout the neck, chest, abdomen and pelvis as well as within lungs, liver, spleen, bones and soft tissues. Findings are suspicious for lymphoma. 2. Small right renal stone. Electronically  Signed   By: Lorin Picket M.D.   On: 09/17/2018 16:03    Labs:  CBC: Recent Labs    10/25/17 0846 09/04/18 0739  WBC 4.5 4.7  HGB 12.7 12.9  HCT 38.1 39.5  PLT 267.0 270.0    COAGS: No results for input(s): INR, APTT in the last 8760 hours.  BMP: Recent Labs    09/04/18 0739  NA 139  K 4.3  CL 101  CO2 30  GLUCOSE 95  BUN 18  CALCIUM 9.1  CREATININE 0.64    LIVER FUNCTION TESTS: Recent Labs    09/04/18 0739  BILITOT 0.4  AST 21  ALT 18  ALKPHOS 110  PROT 6.7   6.7  ALBUMIN 4.1    TUMOR MARKERS: No results for input(s): AFPTM, CEA, CA199, CHROMGRNA in the last 8760 hours.  Assessment and Plan:  Multiple sites of +PET Possible lymphoma Scheduled for Rt [place]/Cervical LN biopsy Risks and benefits of  Rt supraclavicular LN/Cervical LN biopsy was discussed with the patient and/or patient's family including, but not limited to bleeding, infection, damage to adjacent structures or low yield requiring additional tests.  All of the questions were answered and there is agreement to proceed. Consent signed and in chart.  Thank you for this interesting consult.  I greatly enjoyed meeting BETZAIDA CREMEENS and look forward to participating in their care.  A copy of this report was sent to the requesting provider on this date.  Electronically Signed: Lavonia Drafts, PA-C 09/28/2018, 12:03 PM   I spent a total of  30 Minutes   in face to face in clinical consultation, greater than  50% of which was counseling/coordinating care for Rt [place] LN/Cerv LN Bx

## 2018-09-28 NOTE — Discharge Instructions (Addendum)
Needle Biopsy, Care After °This sheet gives you information about how to care for yourself after your procedure. Your health care provider may also give you more specific instructions. If you have problems or questions, contact your health care provider. °What can I expect after the procedure? °After the procedure, it is common to have soreness, bruising, or mild pain at the puncture site. This should go away in a few days. °Follow these instructions at home: °Needle insertion site care ° °· Wash your hands with soap and water before you change your bandage (dressing). If you cannot use soap and water, use hand sanitizer. °· Follow instructions from your health care provider about how to take care of your puncture site. This includes: °? When and how to change your dressing. °? When to remove your dressing. °· Check your puncture site every day for signs of infection. Check for: °? Redness, swelling, or pain. °? Fluid or blood. °? Pus or a bad smell. °? Warmth. °General instructions °· Return to your normal activities as told by your health care provider. Ask your health care provider what activities are safe for you. °· Do not take baths, swim, or use a hot tub until your health care provider approves. Ask your health care provider if you may take showers. You may only be allowed to take sponge baths. °· Take over-the-counter and prescription medicines only as told by your health care provider. °· Keep all follow-up visits as told by your health care provider. This is important. °Contact a health care provider if: °· You have a fever. °· You have redness, swelling, or pain at the puncture site that lasts longer than a few days. °· You have fluid, blood, or pus coming from your puncture site. °· Your puncture site feels warm to the touch. °Get help right away if: °· You have severe bleeding from the puncture site. °Summary °· After the procedure, it is common to have soreness, bruising, or mild pain at the puncture  site. This should go away in a few days. °· Check your puncture site every day for signs of infection, such as redness, swelling, or pain. °· Get help right away if you have severe bleeding from your puncture site. °This information is not intended to replace advice given to you by your health care provider. Make sure you discuss any questions you have with your health care provider. °Document Released: 06/17/2014 Document Revised: 04/14/2017 Document Reviewed: 02/13/2017 °Elsevier Patient Education © 2020 [employer] °Moderate Conscious Sedation, Adult, Care After °These instructions provide you with information about caring for yourself after your procedure. Your health care provider may also give you more specific instructions. Your treatment has been planned according to current medical practices, but problems sometimes occur. Call your health care provider if you have any problems or questions after your procedure. °What can I expect after the procedure? °After your procedure, it is common: °· To feel sleepy for several hours. °· To feel clumsy and have poor balance for several hours. °· To have poor judgment for several hours. °· To vomit if you eat too soon. °Follow these instructions at home: °For at least 24 hours after the procedure: ° °· Do not: °? Participate in activities where you could fall or become injured. °? Drive. °? Use heavy machinery. °? Drink alcohol. °? Take sleeping pills or medicines that cause drowsiness. °? Make important decisions or sign legal documents. °? Take care of children on your own. °· Rest. °Eating and   drinking °· Follow the diet recommended by your health care provider. °· If you vomit: °? Drink water, juice, or soup when you can drink without vomiting. °? Make sure you have little or no nausea before eating solid foods. °General instructions °· Have a responsible adult stay with you until you are awake and alert. °· Take over-the-counter and prescription medicines only as  told by your health care provider. °· If you smoke, do not smoke without supervision. °· Keep all follow-up visits as told by your health care provider. This is important. °Contact a health care provider if: °· You keep feeling nauseous or you keep vomiting. °· You feel light-headed. °· You develop a rash. °· You have a fever. °Get help right away if: °· You have trouble breathing. °This information is not intended to replace advice given to you by your health care provider. Make sure you discuss any questions you have with your health care provider. °Document Released: 11/21/2012 Document Revised: 01/13/2017 Document Reviewed: 05/23/2015 °Elsevier Patient Education © 2020 [employer] ° °

## 2018-10-03 NOTE — Progress Notes (Signed)
HEMATOLOGY/ONCOLOGY CLINIC NOTE  Date of Service: 10/03/2018  Patient Care Team: [name], NP as PCP - General (Internal Medicine)  CHIEF COMPLAINTS/PURPOSE OF CONSULTATION:  Bone lesions concerning for Multiple Myeloma  HISTORY OF PRESENTING ILLNESS:   [name] [name] is a wonderful 63 y.o. female who has been referred to [place] by Dr. [name] from [facility] for evaluation and management of multiple myeloma.The pt reports that she is doing well overall.   The pt reports that she saw Dr. Tonita Cong for shoulder pain and he wanted to order an MRI. She got a shot of cortisone and Dr. Tonita Cong called the next morning and said that she had bone lesions in her left shoulder. She has not had any issues with her left shoulder until a year and a half ago, and she thinks the pain is due to picking up her grandchild. She reports a new pain in her left hip/bottom that began within the past 6 months.  She had a bariatric surgery in 2005 and has been taking iron pills since then. She also takes a multivitamin, OTC Vit D, Vit B12, zinc, Vit A, and Vit C. She has also been on acid suppressants for reflux. She takes Allegra, Omeprazole, and Flonase for her allergies. She takes Xanax before bedtime for her anxiety.  She sees a Dr. At [facility] for interstitial cystitis. She feels more discomfort when she drinks soda. Normally, she drinks mostly water. She drinks one cup of coffee daily and rarely drinks alcohol. Pt has not had a cystoscopy. Denies vaginal bleeding or abnormal discharge.  Of note prior to the patient's visit today, pt has had 3D mammogram completed on 07/11/2018 with results revealing "breast density category B, no mammographic evidence of malignancy."   She also had a shoulder MRI wo contrast on 08/28/2018, which revealed "1. Multifocal small osseous lesions in the proximal humerus and a single similar lesion in the glenoid, highly suspicious for malignancy, such as  multiple myeloma or metastatic disease. 2. Full-thickness, full width teat of the suprespinatus tendon at the greater tuberosity with about 4cm tendon retrac ion. Mild supraspinatus muscle atrophy. 3. Mild diffuse subscapularis and infraspinatus tendinosis. Minimal interstitial tearing of the infraspinatus tendon. Moderate to sever diffuse infraspinatus msucle atrophy. 4. Moderate to severe diffuse atrophy of the teres minor muscle likely related to chronic denervation. No mass in the quadrilateral space. 5. 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 6. Mild lateral downsloping of the acromion and a mild subacromial enthesopathy. 7. Mild glenohumeral athrosis. 8. Degeneration and probable tear of the superior labrum. Associated small intraosseus ganglion and marrow edema in the adjacent glenoid."  Most recent lab results (09/04/2018) of CBC is as follows: all values are WNL. 09/04/2018 TSH at 1.03 07/37/1062 basal metabolic panel all WNL 69/48/5462 hepatic function panel all WNL 09/04/2018 lipid panel all WNL 09/04/2018 protein electrophoresis is pending  On review of systems, pt reports left shoulder pain, right hip/bottom pain and denies fever, chills, night sweats, unexpected weight loss, changes in bowel habits, and any other symptoms.   On PMHx the pt reports hx of 2 knee replacements, arthritis, allergies, anxiety On Social Hx the pt reports rare alcohol usage and has never smoked cigarettes On Family Hx the pt reports that her dad was diagnosed with prostate cancer at 62 y.o. and her brother was diagnosed with testicular cancer in his mid 63s-40s.   Interval History  [name] [name] is a 63 y.o. female presenting today for  the management and evaluation of bone lesions, possible MM. The patient's last visit with [place] was on 09/20/2018. The pt reports that she is doing well overall. She is joined on the phone by her husband.   The pt reports no new symptoms since last  meeting. She has been experiencing fatigue that has increased since her biopsy was order, but she feels that it's due to stress. Pt has Hx of arthritis.  Lab results (09/28/2018) of CBC w/diff and CMP is as follows: all values are WNL. 09/28/2018 Protime-INR is WNL.   On review of systems, pt reports swelling in her knee, fatigue and denies any other symptoms.   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   Elevated alkaline phosphatase level    negative workup (presumed secondary to fatty liver)   Frequency-urgency syndrome    GERD (gastroesophageal reflux disease)    Interstitial cystitis    Dr Vernie Shanks   Neuromuscular disorder [facility])    rt arm carpal tunnel syndrome   Obesity    status post bariatric procedure in [place] 2005   Osteoporosis    Thyroid nodule 7/10   `   Thyroid nodule    Varicose vein of leg     SURGICAL HISTORY: Past Surgical History:  Procedure Laterality Date   ANTERIOR CERVICAL DECOMP/DISCECTOMY FUSION N/A 06/21/2012   Procedure: ANTERIOR CERVICAL DECOMPRESSION/DISCECTOMY FUSION C-4 - C5 (SPACER/DePUY CERVICAL PLATE ONLY) 1 LEVEL;  Surgeon: Melina Schools, MD;  Location: [place];  Service: Orthopedics;  Laterality: N/A;   BLADDER SURGERY  02/21/12 and 02/28/12   Dr Diona Fanti   COLPOSCOPY  1980   CRYOTHERAPY  1980   CYSTO WITH HYDRODISTENSION  12/12/2011   Procedure: CYSTOSCOPY/HYDRODISTENSION;  Surgeon: Franchot Gallo, MD;  Location[facility];  Service: Urology;  Laterality: N/A;  [place]   bilateral cataract extraction   FOOT SURGERY     bilateral bunionettes   FOOT SURGERY Right 01/07/14   Pt states surgery was due to arthritis. "Has pins in place now"   GASTRIC BYPASS  2005   JOINT REPLACEMENT  2008   left total knee   KNEE SURGERY  1997   left knee   NASAL SINUS  SURGERY  6004   Dr Erik Obey   REFRACTIVE SURGERY  2006   lasik   SHOULDER SURGERY  2 2012   RIGHT    STOMACH SURGERY  2007   "tummy tuck"   TOTAL KNEE ARTHROPLASTY Right 12/31/2015   Procedure: RIGHT TOTAL KNEE ARTHROPLASTY;  Surgeon: [name], MD;  Location: [facility];  Service: Orthopedics;  Laterality: Right;  Adductor Block   VARICOSE VEIN SURGERY Right 02/2012   leg    SOCIAL HISTORY: Social History   Socioeconomic History   Marital status: Divorced    Spouse name: Not on file   Number of children: 1   Years of education: Not on file   Highest education level: Not on file  Occupational History   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[employer]   Financial resource strain: Not on file   Food insecurity    Worry: Not on file    Inability: Not on file   Transportation needs    Medical: Not on file  Non-medical: Not on file  Tobacco Use   Smoking status: Never Smoker   Smokeless tobacco: Never Used  Substance and Sexual Activity   Alcohol use: Yes    Comment: RARE   Drug use: No   Sexual activity: Yes    Comment: 1st intercourse 63 yo-More than 5 partners  Lifestyle   Physical activity    Days per week: Not on file    Minutes per session: Not on file   Stress: Not on file  Relationships   Social connections    Talks on phone: Not on file    Gets together: Not on file    Attends religious service: Not on file    Active member of club or organization: Not on file    Attends meetings of clubs or organizations: Not on file    Relationship status: Not on file   Intimate partner violence    Fear of current or ex partner: Not on file    Emotionally abused: Not on file    Physically abused: Not on file    Forced sexual activity: Not on file  Other Topics Concern   Not on file  Social History Narrative   Not on file    FAMILY HISTORY: Family History  Problem Relation Age of Onset   Diabetes Paternal  Grandfather    Stroke Paternal Grandfather    Hyperlipidemia Mother    Hypertension Mother    Hypertension Father    Diabetes Father    Prostate cancer Father    Dementia Father    Heart disease Maternal Grandmother    Hypertension Maternal Grandmother    Heart disease Maternal Grandfather    Hypertension Maternal Grandfather    Stroke Maternal Grandfather    Arthritis Brother    Testicular cancer Brother    Colon cancer Neg Hx    Esophageal cancer Neg Hx    Pancreatic cancer Neg Hx    Stomach cancer Neg Hx     ALLERGIES:  is allergic to nitrofurantoin.  MEDICATIONS:  Current Outpatient Medications  Medication Sig Dispense Refill   acetaminophen (TYLENOL) 650 MG CR tablet Take 2 tablets (1,300 mg total) by mouth every 8 (eight) hours as needed for pain. Take for mild pain. Do not combine with Percocet. Do not exceed daily recommended limit of Tylenol.     ALPRAZolam (XANAX) 0.5 MG tablet TAKE 1 TABLET (0.5 MG TOTAL) BY MOUTH 2 (TWO) TIMES DAILY. (Patient taking differently: Take 0.5 mg by mouth 2 (two) times daily as needed for anxiety. ) 60 tablet 1   amitriptyline (ELAVIL) 50 MG tablet TAKE 1 TABLET BY MOUTH EVERY EVENING  3   azelastine (ASTELIN) 0.1 % nasal spray Place 1 spray into both nostrils 2 (two) times daily. Use in each nostril as directed (Patient taking differently: Place 1 spray into both nostrils 2 (two) times daily as needed for allergies. Use in each nostril as directed) 30 mL 12   Cholecalciferol (VITAMIN D-1000 MAX ST) 25 MCG (1000 UT) tablet Take 1 tablet (1,000 Units total) by mouth daily.     Ferrous Sulfate (IRON) 325 (65 Fe) MG TABS Take 1 tablet (325 mg total) by mouth daily. 30 each 0   Fexofenadine HCl (ALLEGRA PO) Take 180 mg by mouth daily.      fluticasone (FLONASE) 50 MCG/ACT nasal spray Place 2 sprays into both nostrils daily. 16 g 6   Lifitegrast (XIIDRA) 5 % SOLN Place 1 drop into both eyes 2 (two) times daily.  Multiple Vitamin (MULTIVITAMIN) tablet Take 1 tablet by mouth daily.      multivitamin-lutein (OCUVITE-LUTEIN) CAPS capsule Take 1 capsule daily by mouth.     MYRBETRIQ 50 MG TB24 tablet Take 50 mg by mouth every evening.   2   omeprazole (PRILOSEC) 40 MG capsule TAKE 1 CAPSULE BY MOUTH EVERY DAY 90 capsule 0   OVER THE COUNTER MEDICATION Place 1 drop into both eyes 2 (two) times daily. Allergy eye drops     PARoxetine (PAXIL) 40 MG tablet Take 1 tablet (40 mg total) by mouth every morning. 30 tablet 2   valACYclovir (VALTREX) 1000 MG tablet Take 0.5 tablets (500 mg total) by mouth daily. 30 tablet 11   VITAMIN A PO Take 1 capsule by mouth daily.      vitamin C (ASCORBIC ACID) 500 MG tablet Take 500 mg by mouth daily.     zinc gluconate 50 MG tablet Take 50 mg by mouth daily.     No current facility-administered medications for this visit.     REVIEW OF SYSTEMS:    A 10+ POINT REVIEW OF SYSTEMS WAS OBTAINED including neurology, dermatology, psychiatry, cardiac, respiratory, lymph, extremities, GI, GU, Musculoskeletal, constitutional, breasts, reproductive, HEENT.  All pertinent positives are noted in the HPI.  All others are negative.   PHYSICAL EXAMINATION: ECOG PERFORMANCE STATUS: 1 - Symptomatic but completely ambulatory  . Vitals:   10/04/18 1526  BP: 133/84  Pulse: 92  Resp: 18  Temp: 98 F (36.7 C)  SpO2: 96%   Filed Weights   10/04/18 1526  Weight: 242 lb 11.2 oz (110.1 kg)   .Body mass index is 41.02 kg/m.  GENERAL:alert, in no acute distress and comfortable SKIN: no acute rashes, no significant lesions EYES: conjunctiva are pink and non-injected, sclera anicteric OROPHARYNX: MMM, no exudates, no oropharyngeal erythema or ulceration NECK: supple, no JVD LYMPH:  no palpable lymphadenopathy in the cervical, axillary or inguinal regions LUNGS: clear to auscultation b/l with normal respiratory effort HEART: regular rate & rhythm ABDOMEN:  normoactive bowel  sounds , non tender, not distended. No palpable hepatosplenomegaly.  Extremity: no pedal edema PSYCH: alert & oriented x 3 with fluent speech NEURO: no focal motor/sensory deficits   LABORATORY DATA:  I have reviewed the data as listed . CBC Latest Ref Rng & Units 09/28/2018 09/04/2018 10/25/2017  WBC 4.0 - 10.5 K/uL 4.0 4.7 4.5  Hemoglobin 12.0 - 15.0 g/dL 12.8 12.9 12.7  Hematocrit 36.0 - 46.0 % 39.7 39.5 38.1  Platelets 150 - 400 K/uL 279 270.0 267.0   . CMP Latest Ref Rng & Units 09/04/2018 09/04/2018 09/15/2017  Glucose 70 - 99 mg/dL - 95 120(H)  BUN 6 - 23 mg/dL - 18 16  Creatinine 0.40 - 1.20 mg/dL - 0.64 0.54  Sodium 135 - 145 mEq/L - 139 142  Potassium 3.5 - 5.1 mEq/L - 4.3 4.2  Chloride 96 - 112 mEq/L - 101 108  CO2 19 - 32 mEq/L - 30 28  Calcium 8.4 - 10.5 mg/dL - 9.1 8.9  Total Protein 6.1 - 8.1 g/dL 6.7 6.7 6.6  Total Bilirubin 0.2 - 1.2 mg/dL - 0.4 0.4  Alkaline Phos 39 - 117 U/L - 110 87  AST 0 - 37 U/L - 21 14  ALT 0 - 35 U/L - 18 15   09/28/2018 Flow Cytometry    09/28/2018 Lymph Node Biopsy    09/06/2018 LABS: Component     Latest Ref Rng & Units 09/06/2018  IgG (  Immunoglobin G), Serum     586 - 1,602 mg/dL 1,254  IgA     87 - 352 mg/dL 377 (H)  IgM (Immunoglobulin M), Srm     26 - 217 mg/dL 12 (L)  Total Protein ELP     6.0 - 8.5 g/dL 7.1  Albumin SerPl Elph-Mcnc     2.9 - 4.4 g/dL 4.1  Alpha 1     0.0 - 0.4 g/dL 0.2  Alpha2 Glob SerPl Elph-Mcnc     0.4 - 1.0 g/dL 0.8  B-Globulin SerPl Elph-Mcnc     0.7 - 1.3 g/dL 0.9  Gamma Glob SerPl Elph-Mcnc     0.4 - 1.8 g/dL 1.1  M Protein SerPl Elph-Mcnc     Not Observed g/dL Not Observed  Globulin, Total     2.2 - 3.9 g/dL 3.0  Albumin/Glob SerPl     0.7 - 1.7 1.4  IFE 1      Comment  Please Note (HCV):      Comment  Kappa free light chain     3.3 - 19.4 mg/L 19.2  Lamda free light chains     5.7 - 26.3 mg/L 21.2  Kappa, lamda light chain ratio     0.26 - 1.65 0.91  Beta-2 Microglobulin      0.6 - 2.4 mg/L 2.4  LDH     98 - 192 U/L 224 (H)  Sed Rate     0 - 22 mm/hr 19  Vitamin D, 25-Hydroxy     30.0 - 100.0 ng/mL 23.5 (L)  Vitamin B12     180 - 914 pg/mL 1,415 (H)  Ferritin     11 - 307 ng/mL 241    RADIOGRAPHIC STUDIES: I have personally reviewed the radiological images as listed and agreed with the findings in the report. Nm Pet Image Initial (pi) Skull Base To Thigh  Result Date: 09/17/2018 CLINICAL DATA:  Initial treatment strategy for bone lesion, possible unknown primary malignancy. EXAM: NUCLEAR MEDICINE PET SKULL BASE TO THIGH TECHNIQUE: 12.6 mCi F-18 FDG was injected intravenously. Full-ring PET imaging was performed from the skull base to thigh after the radiotracer. CT data was obtained and used for attenuation correction and anatomic localization. Fasting blood glucose: 82 mg/dl COMPARISON:  None. FINDINGS: Mediastinal blood pool activity: SUV max 3.3 Liver activity: SUV max NA NECK: Hypermetabolic lymph nodes in the neck are seen within index posterior left lymph node measuring 4 mm (CT image 26) with an SUV max of 7.9. Incidental CT findings: None. CHEST: Hypermetabolic adenopathy is seen throughout the low internal jugular stations, mediastinum and both hilar regions. Index cluster of low right paratracheal lymph nodes has an SUV max of 19.1. Right hilar adenopathy has an SUV max of 23.6. Hypermetabolic adenopathy extends along the course of the esophagus and is seen in the prepericardiac regions. There are intramuscular and subcutaneous hypermetabolic nodules as well. Subcentimeter nodules overlying the proximal left humerus have an SUV max of 2.6. Multiple hypermetabolic nodules in the lungs seen in the hematogenous distribution. Incidental CT findings: Coronary artery calcification. No pericardial or pleural effusion. ABDOMEN/PELVIS: Hypermetabolic lesions are seen in the liver with an index lesion adjacent to the gallbladder fossa, measuring 10 mm (CT image 101). No  abnormal hypermetabolism in the adrenal glands or pancreas. There are areas of hypermetabolism in the spleen, with an SUV max of 7.2 medially. Extensive hypermetabolic adenopathy throughout the abdomen and pelvis. Periportal nodal mass measures 2.6 cm (CT image 109) with an SUV  max 14.7. Aortocaval lymph node measures 10 mm (CT image 126) with an SUV max of 6.4. Multiple hypermetabolic mesenteric lymph nodes are seen with an index ileocolic lymph node measuring 11 mm (143) and an SUV max of 12.6. Pelvic retroperitoneal and inguinal adenopathy is hypermetabolic with an index right external iliac lymph node measuring 1.4 cm (165) with an SUV max of 16.9. Incidental CT findings: Liver, gallbladder, adrenal glands otherwise unremarkable. Small stone in the right kidney. Kidneys, spleen and pancreas are otherwise unremarkable. Postoperative changes in the stomach and proximal small bowel. Bowel is otherwise unremarkable. SKELETON: Hypermetabolic lesions are seen throughout osseous structures. Index lesion in L1 has an SUV max of 14.2. Incidental CT findings: None. IMPRESSION: 1. Widespread hypermetabolic metastatic disease involving lymph nodes throughout the neck, chest, abdomen and pelvis as well as within lungs, liver, spleen, bones and soft tissues. Findings are suspicious for lymphoma. 2. Small right renal stone. Electronically Signed   By: Lorin Picket M.D.   On: 09/17/2018 16:03   [place] Core Biopsy (lymph Nodes)  Result Date: 09/28/2018 INDICATION: 63 year old with diffuse lymphadenopathy and concern for lymphoma. Tissue diagnosis is needed. EXAM: ULTRASOUND-GUIDED CORE BIOPSY OF RIGHT SUPRACLAVICULAR LYMPH NODE MEDICATIONS: None. ANESTHESIA/SEDATION: Moderate (conscious) sedation was employed during this procedure. A total of Versed 2.0 mg and Fentanyl 100 mcg was administered intravenously. Moderate Sedation Time: 15 minutes. The patient's level of consciousness and vital signs were monitored continuously by  radiology nursing throughout the procedure under my direct supervision. FLUOROSCOPY TIME:  Fluoroscopy Time: None COMPLICATIONS: None immediate. PROCEDURE: Informed written consent was obtained from the patient after a thorough discussion of the procedural risks, benefits and alternatives. All questions were addressed. A timeout was performed prior to the initiation of the procedure. Ultrasound was used to evaluate the right supraclavicular region. Enlarged lymph nodes were identified. The right side of the neck was prepped with chlorhexidine and sterile field was created. Skin was anesthetized with 1% lidocaine. Using ultrasound guidance, 18 gauge core needle was directed into a large right supraclavicular lymph node lateral to the right internal jugular vein and lateral to the right common carotid artery. Total of 6 core biopsies were obtained. Adequate specimens were obtained and placed in saline. Bandage placed over the puncture site. FINDINGS: Enlarged hypoechoic lymph nodes in the right supraclavicular region. Core biopsies were obtained from 1 of the larger lymph nodes. No significant bleeding or hematoma formation following the core biopsies. IMPRESSION: Successful ultrasound-guided core biopsies of a right supraclavicular lymph node. Electronically Signed   By: Markus Daft M.D.   On: 09/28/2018 14:48     08/28/2018 Shoulder MRI without contrast:  "1. Multifocal small osseous lesions in the proximal humerus and a single similar lesion in the glenoid, highly suspicious for malignancy, such as multiple myeloma or metastatic disease. 2. Full-thickness, full width teat of the suprespinatus tendon at the greater tuberosity with about 4cm tendon retrac ion. Mild supraspinatus muscle atrophy. 3. Mild diffuse subscapularis and infraspinatus tendinosis. Minimal interstitial tearing of the infraspinatus tendon. Moderate to sever diffuse infraspinatus msucle atrophy. 4. Moderate to severe diffuse atrophy of the  teres minor muscle likely related to chronic denervation. No mass in the quadrilateral space. 5. 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 6. Mild lateral downsloping of the acromion and a mild subacromial enthesopathy. 7. Mild glenohumeral athrosis. 8. Degeneration and probable tear of the superior labrum. Associated small intraosseus ganglion and marrow edema in the adjacent glenoid."  ASSESSMENT & PLAN:   1.  Bone metastases with unknown primary noted on MRI shoulder  09/17/2018 PET skull base to thigh revealed "1. Widespread hypermetabolic metastatic disease involving lymph nodes throughout the neck, chest, abdomen and pelvis as well as within lungs, liver, spleen, bones and soft tissues. Findings are suspicious for lymphoma. 2. Small right renal stone."  PLAN:  -Discussed pt labwork, 09/28/2018; all values are WNL. -(09/28/2018) Lymph Node Biopsy is consistent with Sarcoidosis.   -discussed that Tx options for Sarcoidosis include treating w/ Prednisone if symptomatic or monitoring w/ labs overtime. -Referred to [facility] for surgical biopsy to r/o lymphoma.  -Will see the pt back in 1 month   -Referral to [place] [facility] (Dr Dalbert Batman) for excisional LN biopsy  -RTC with Dr Irene Limbo in 1 month to review pathology results   Orders Placed This Encounter  Procedures   Ambulatory referral to General Surgery    Referral Priority:   Routine    Referral Type:   Surgical    Referral Reason:   Specialty Services Required    Referred to Provider:   Fanny Skates, MD    Requested Specialty:   General Surgery    Number of Visits Requested:   1    All of the patients questions were answered with apparent satisfaction. The patient knows to call the clinic with any problems, questions or concerns.  The total time spent in the appt was 25 minutes and more than 50% was on counseling and direct patient cares.  Sullivan Lone MD MS AAHIVMS [facility]  [facility] Hematology/Oncology Physician [facility]  (Office):       [phone] (Work cell):  [phone] (Fax):           [phone]  10/03/2018 9:20 AM  I, De Burrs, am acting as a scribe for Dr. Irene Limbo  .I have reviewed the above documentation for accuracy and completeness, and I agree with the above. [name] MD

## 2018-10-04 ENCOUNTER — Inpatient Hospital Stay: Payer: BC Managed Care – PPO | Admitting: Hematology

## 2018-10-04 ENCOUNTER — Other Ambulatory Visit: Payer: Self-pay

## 2018-10-04 ENCOUNTER — Telehealth: Payer: Self-pay | Admitting: Hematology

## 2018-10-04 VITALS — BP 133/84 | HR 92 | Temp 98.0°F | Resp 18 | Ht 64.5 in | Wt 242.7 lb

## 2018-10-04 DIAGNOSIS — C7951 Secondary malignant neoplasm of bone: Secondary | ICD-10-CM | POA: Diagnosis not present

## 2018-10-04 DIAGNOSIS — R922 Inconclusive mammogram: Secondary | ICD-10-CM | POA: Diagnosis not present

## 2018-10-04 DIAGNOSIS — D869 Sarcoidosis, unspecified: Secondary | ICD-10-CM | POA: Diagnosis not present

## 2018-10-04 DIAGNOSIS — F419 Anxiety disorder, unspecified: Secondary | ICD-10-CM | POA: Diagnosis not present

## 2018-10-04 DIAGNOSIS — R591 Generalized enlarged lymph nodes: Secondary | ICD-10-CM

## 2018-10-04 DIAGNOSIS — N200 Calculus of kidney: Secondary | ICD-10-CM | POA: Diagnosis not present

## 2018-10-04 DIAGNOSIS — Z79899 Other long term (current) drug therapy: Secondary | ICD-10-CM | POA: Diagnosis not present

## 2018-10-04 DIAGNOSIS — M25551 Pain in right hip: Secondary | ICD-10-CM | POA: Diagnosis not present

## 2018-10-04 DIAGNOSIS — C787 Secondary malignant neoplasm of liver and intrahepatic bile duct: Secondary | ICD-10-CM | POA: Diagnosis not present

## 2018-10-04 DIAGNOSIS — Z881 Allergy status to other antibiotic agents status: Secondary | ICD-10-CM | POA: Diagnosis not present

## 2018-10-04 DIAGNOSIS — K219 Gastro-esophageal reflux disease without esophagitis: Secondary | ICD-10-CM | POA: Diagnosis not present

## 2018-10-04 DIAGNOSIS — R590 Localized enlarged lymph nodes: Secondary | ICD-10-CM | POA: Diagnosis not present

## 2018-10-04 DIAGNOSIS — R918 Other nonspecific abnormal finding of lung field: Secondary | ICD-10-CM | POA: Diagnosis not present

## 2018-10-04 DIAGNOSIS — M25552 Pain in left hip: Secondary | ICD-10-CM | POA: Diagnosis not present

## 2018-10-04 DIAGNOSIS — C9000 Multiple myeloma not having achieved remission: Secondary | ICD-10-CM | POA: Diagnosis not present

## 2018-10-04 DIAGNOSIS — R748 Abnormal levels of other serum enzymes: Secondary | ICD-10-CM | POA: Diagnosis not present

## 2018-10-04 DIAGNOSIS — M25512 Pain in left shoulder: Secondary | ICD-10-CM | POA: Diagnosis not present

## 2018-10-04 DIAGNOSIS — M6250 Muscle wasting and atrophy, not elsewhere classified, unspecified site: Secondary | ICD-10-CM | POA: Diagnosis not present

## 2018-10-04 NOTE — Telephone Encounter (Signed)
Scheduled appt per 8/20 los.  Spoke with patient and she is aware of her appt date and time,

## 2018-10-05 ENCOUNTER — Encounter: Payer: Self-pay | Admitting: Family

## 2018-10-05 ENCOUNTER — Telehealth: Payer: Self-pay | Admitting: Family

## 2018-10-05 DIAGNOSIS — E559 Vitamin D deficiency, unspecified: Secondary | ICD-10-CM

## 2018-10-05 MED ORDER — VITAMIN D (ERGOCALCIFEROL) 1.25 MG (50000 UNIT) PO CAPS: 50000.0000 [IU] | ORAL_CAPSULE | ORAL | 0 refills | Status: DC

## 2018-10-05 NOTE — Telephone Encounter (Signed)
Left msg to call back for doxy.

## 2018-10-10 ENCOUNTER — Other Ambulatory Visit: Payer: Self-pay

## 2018-10-10 ENCOUNTER — Ambulatory Visit (HOSPITAL_BASED_OUTPATIENT_CLINIC_OR_DEPARTMENT_OTHER)
Admission: RE | Admit: 2018-10-10 | Discharge: 2018-10-10 | Disposition: A | Discharge status: Home / Self Care | Payer: BC Managed Care – PPO | Source: Ambulatory Visit | Attending: Family | Admitting: Family

## 2018-10-10 DIAGNOSIS — E2839 Other primary ovarian failure: Secondary | ICD-10-CM | POA: Diagnosis not present

## 2018-10-10 DIAGNOSIS — Z780 Asymptomatic menopausal state: Secondary | ICD-10-CM | POA: Diagnosis not present

## 2018-10-10 DIAGNOSIS — M8589 Other specified disorders of bone density and structure, multiple sites: Secondary | ICD-10-CM | POA: Diagnosis not present

## 2018-10-15 ENCOUNTER — Telehealth: Payer: Self-pay | Admitting: Family

## 2018-10-17 ENCOUNTER — Telehealth: Payer: Self-pay | Admitting: Family

## 2018-10-17 NOTE — Telephone Encounter (Signed)
Called both numbers listed but no answer, lvm for her to call back for resuls

## 2018-10-17 NOTE — Telephone Encounter (Signed)
Please advise pt that her bone density shows bone thinning.  I would recommend that she continue vitamin D, add caltrate 600mg  twice daily and make sure to get regular exercise such as walking for bone health. She should plan to repeat her bone density in 2 years.

## 2018-10-17 NOTE — Telephone Encounter (Signed)
Patient advised of results and to start caltrate 600mg  bid along with exercise. She verbalized understanding.

## 2018-10-17 NOTE — Telephone Encounter (Signed)
Opened in error

## 2018-10-18 ENCOUNTER — Telehealth: Payer: Self-pay | Admitting: *Deleted

## 2018-10-18 NOTE — Telephone Encounter (Signed)
Patient called requesting further information on surgical referral made by Dr. Irene Limbo. Informed her referral was made to Dr. Fanny Skates with [facility]. Gave contact/location info to patient.

## 2018-10-21 ENCOUNTER — Other Ambulatory Visit: Payer: Self-pay | Admitting: Family

## 2018-10-23 ENCOUNTER — Encounter: Payer: Self-pay | Admitting: Family

## 2018-10-23 NOTE — Telephone Encounter (Signed)
Last written: 08/17/18 Last ov: 09/04/18 Next ov: 12/05/18 Contract: due UDS: due

## 2018-10-31 ENCOUNTER — Other Ambulatory Visit: Payer: Self-pay | Admitting: General Surgery

## 2018-10-31 DIAGNOSIS — R590 Localized enlarged lymph nodes: Secondary | ICD-10-CM | POA: Diagnosis not present

## 2018-10-31 DIAGNOSIS — Z6838 Body mass index (BMI) 38.0-38.9, adult: Secondary | ICD-10-CM | POA: Diagnosis not present

## 2018-10-31 DIAGNOSIS — N3010 Interstitial cystitis (chronic) without hematuria: Secondary | ICD-10-CM | POA: Diagnosis not present

## 2018-10-31 DIAGNOSIS — Z9884 Bariatric surgery status: Secondary | ICD-10-CM | POA: Diagnosis not present

## 2018-11-02 ENCOUNTER — Telehealth: Payer: Self-pay | Admitting: Hematology

## 2018-11-02 NOTE — Telephone Encounter (Signed)
Returned patient's phone call regarding rescheduling an appointment, left a voicemail. 

## 2018-11-04 NOTE — H&P (Signed)
[name] [name] Location: [facility] Patient #: [id number] DOB: [birth year] Single / Language: [name] [name] / Race: White Female      History of Present Illness       This is a very pleasant 63 year old female, referred by [name] [name] at the cancer center for consideration of lymph node biopsy to rule out lymphoma. [name] [name] is her orthopedic surgeon. [name] Alar, NP provides primary care. [name] [name] was not chaperoned for the encounter.      The patient has had numerous orthopedic problems. She is being evaluated for shoulder problems and [name] [name] and MRI of which showed osseous lesions in the proximal humerus consistent with multiple myeloma or metastatic cancer. She had lots of blood work which was normal. Subsequent image guided right supraclavicular lymph node biopsy showed noncaseating granulomata consistent with sarcoidosis PET scan showed numerous hypermetabolic lymph nodes in the neck, chest, abdomen, pelvis, lungs, liver, spleen, and bones, suspicious for lymphoma. I have reviewed these images in detail. 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 Current BMI 38.9. Interstitial cystitis followed by [facility]. Mammograms in May 2020 are normal. In October 2019 she underwent needle biopsy of a benign thyroid nodule. Shoulder surgery. Cervical spine, shoulder surgery. Bilateral total knee replacement. Right foot surgery. family history father had prostate cancer. Brother had testicular cancer [name] [name] reveals she lives in [place] with a common-law husband. 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 Denies tobacco. Takes alcohol occasionally      Physical exam revealed a 1 cm palpable nodule in the superficial right neck posterior triangle she says this came up after the image guided biopsy. No  other dramatic lymph nodes in the neck or axilla. She has a palpable lymph node in the right groin 2 cm or less. Significant obesity     It seems like the most accessible lymph node basin is the right neck posterior triangle and so will go there first. I told her that if this was not diagnostic she might have to have a second operation in the right groin although that's a higher risk for wound healing problems. We would like to avoid going in her chest or abdomen and she fully agrees.      She'll be scheduled for excision deep right cervical lymph node, posterior triangle under general anesthesia in the near future. I discussed the indications, details, techniques, and numerous risk of the surgery with her. She is aware the risks of bleeding, infection, nerve damage with chronic pain or numbness. She is aware of the rapid devastating competition of spinal accessory nerve transection with chronic pain and shoulder disability. She understands all these issues. All of her questions were answered. She agrees with this plan.     Past Surgical History  Cataract Surgery  Bilateral. Foot Surgery  Right. Gastric Bypass  Knee Surgery  Bilateral. Oral Surgery  Shoulder Surgery  Right. Spinal Surgery - Neck   Diagnostic Studies History  Colonoscopy  within last year Mammogram  within last year Pap Smear  1-5 years ago  Allergies  No Known Drug Allergies  Allergies Reconciled   Medication History  ALPRAZolam (0.5MG Tablet, Oral) Active. Amitriptyline HCl (50MG Tablet, Oral) Active. Fluconazole (150MG Tablet, Oral) Active. Myrbetriq (50MG Tablet ER 24HR, Oral) Active. Omeprazole (40MG Capsule DR, Oral) Active. PARoxetine HCl (30MG Tablet,  Oral) Active. Phenazopyridine HCl (200MG Tablet, Oral) Active. Vitamin D (Ergocalciferol) (1.25 MG(50000 UT) Capsule, Oral) Active. Xiidra (5% Solution, Ophthalmic) Active. Uro-MP (118MG Capsule, Oral) Active. Zinc Gluconate  (50MG Tablet, Oral) Active. Medications Reconciled  Social History  occasional alcohol use. Caffeine use  Coffee, Tea. No drug use  Tobacco use  Never smoker.  Family History  Alcohol Abuse  Daughter. Arthritis  Brother, Father, Mother. Colon Cancer  Father. Depression  Daughter. Diabetes Mellitus  Father. Heart disease in female family member before age 35  Hypertension  Father, Mother. Migraine Headache  Daughter. Prostate Cancer  Father. Respiratory Condition  Father.  Pregnancy / Birth History  Age at menarche  54 years. Age of menopause  110-50 Gravida  1 Maternal age  61-30 Para  1  Other Problems  Anxiety Disorder  Arthritis  Bladder Problems  Gastroesophageal Reflux Disease     Review of Systems  General Not Present- Appetite Loss, Chills, Fatigue, Fever, Night Sweats, Weight Gain and Weight Loss. Skin Present- New Lesions. Not Present- Change in Wart/Mole, Dryness, Hives, Jaundice, Non-Healing Wounds, Rash and Ulcer. HEENT Present- Seasonal Allergies and Wears glasses/contact lenses. Not Present- Earache, Hearing Loss, Hoarseness, Nose Bleed, Oral Ulcers, Ringing in the Ears, Sinus Pain, Sore Throat, Visual Disturbances and Yellow Eyes. Respiratory Not Present- Bloody sputum, Chronic Cough, Difficulty Breathing, Snoring and Wheezing. Breast Not Present- Breast Mass, Breast Pain, Nipple Discharge and Skin Changes. Cardiovascular Not Present- Chest Pain, Difficulty Breathing Lying Down, Leg Cramps, Palpitations, Rapid Heart Rate, Shortness of Breath and Swelling of Extremities. Gastrointestinal Not Present- Abdominal Pain, Bloating, Bloody Stool, Change in Bowel Habits, Chronic diarrhea, Constipation, Difficulty Swallowing, Excessive gas, Gets full quickly at meals, Hemorrhoids, Indigestion, Nausea, Rectal Pain and Vomiting. Female Genitourinary Present- Frequency, Nocturia and Urgency. Not Present- Painful Urination and Pelvic  Pain. Musculoskeletal Present- Back Pain, Joint Stiffness and Swelling of Extremities. Not Present- Joint Pain, Muscle Pain and Muscle Weakness. Neurological Not Present- Decreased Memory, Fainting, Headaches, Numbness, Seizures, Tingling, Tremor, Trouble walking and Weakness. Psychiatric Present- Anxiety. Not Present- Bipolar, Change in Sleep Pattern, Depression, Fearful and Frequent crying. Endocrine Not Present- Cold Intolerance, Excessive Hunger, Hair Changes, Heat Intolerance, Hot flashes and New Diabetes. Hematology Not Present- Blood Thinners, Easy Bruising, Excessive bleeding, Gland problems, HIV and Persistent Infections.  Vitals  Weight: 230.6 lb Height: 64.5in Body Surface Area: 2.09 m Body Mass Index: 38.97 kg/m  Temp.: 76F  Pulse: 100 (Regular)  P.OX: 97% (Room air) BP: 124/80(Sitting, Left Arm, Standard)     Physical Exam  General Mental Status-Alert. General Appearance-Consistent with stated age. Hydration-Well hydrated. Voice-Normal.  Head and Neck Note: There is a 1 cm nodule in the right neck posterior triangle. This seems relatively superficial. She says is at the biopsy site. Doesn't seem like a hematoma. Quite mobile. Maybe tethered a little bit. I'm not sure I feel any deeper lymph nodes in the neck.   Eye Eyeball - Bilateral-Extraocular movements intact. Sclera/Conjunctiva - Bilateral-No scleral icterus.  Chest and Lung Exam Chest and lung exam reveals -quiet, even and easy respiratory effort with no use of accessory muscles and on auscultation, normal breath sounds, no adventitious sounds and normal vocal resonance. Inspection Chest Wall - Normal. Back - normal.  Cardiovascular Cardiovascular examination reveals -normal heart sounds, regular rate and rhythm with no murmurs and normal pedal pulses bilaterally.  Abdomen Inspection Inspection of the abdomen reveals - No Hernias. Skin - Scar - no surgical  scars. Palpation/Percussion Palpation and Percussion of the abdomen reveal -  Soft, Non Tender, No Rebound tenderness, No Rigidity (guarding) and No hepatosplenomegaly. Auscultation Auscultation of the abdomen reveals - Bowel sounds normal.  Neurologic Neurologic evaluation reveals -alert and oriented x 3 with no impairment of recent or remote memory. Mental Status-Normal.  Musculoskeletal Normal Exam - Left-Upper Extremity Strength Normal and Lower Extremity Strength Normal. Normal Exam - Right-Upper Extremity Strength Normal and Lower Extremity Strength Normal.  Lymphatic Note: No palpable axillary adenopathy. might be obscured by body habitus. I could feel a lymph node in the right groin which is smooth and maybe 2 cm or less.     Assessment & Plan   LYMPHADENOPATHY, CERVICAL (R59.0)   You have been found to have multiple spots in your proximal humerus consistent with multiple myeloma or metastatic cancer Biopsy of your right supraclavicular lymph node by radiology shows noncaseating granuloma, which is more consistent with sarcoidosis. Your diagnosis may the sarcoidosis. your PET scan shows diffuse hypermetabolic lymph nodes in your neck chest abdomen and pelvis as well as your lungs liver spleen and bones. The radiologists think this is suspicious for lymphoma  [name] [name] thinks we need more tissue to rule out lymphoma and I agree On exam you have a nodule in your right neck which actually has come up since the needle biopsy. This may be an enlarging lymph node you also have a small palpable lymph node in your right inguinal area  He will be scheduled for excision of deep cervical lymph node and right neck posterior triangle under general anesthesia This type of surgery heals well usually If we fail to get a diagnosis [name] you may have to have another operation to get another lymph node out, possibly in your right groin Surgery in your right groin would have  increased risk of infection and fluid collections so we will hold off on that for now  Dr. 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 Dalbert Batman, M.D., [facility] General and Minimally invasive Surgery Breast and Colorectal Surgery Office:   [phone] Pager:   [phone]

## 2018-11-05 ENCOUNTER — Telehealth: Payer: Self-pay | Admitting: Hematology

## 2018-11-05 NOTE — Telephone Encounter (Signed)
R/s appt per 9/18 sch message - pt is aware of appt date and time   

## 2018-11-06 ENCOUNTER — Inpatient Hospital Stay: Payer: BC Managed Care – PPO | Admitting: Hematology

## 2018-11-06 ENCOUNTER — Other Ambulatory Visit (HOSPITAL_COMMUNITY)
Admission: RE | Admit: 2018-11-06 | Discharge: 2018-11-06 | Disposition: A | Discharge status: Home / Self Care | Payer: BC Managed Care – PPO | Source: Ambulatory Visit | Attending: General Surgery | Admitting: General Surgery

## 2018-11-06 DIAGNOSIS — Z01812 Encounter for preprocedural laboratory examination: Secondary | ICD-10-CM | POA: Diagnosis not present

## 2018-11-06 DIAGNOSIS — Z20828 Contact with and (suspected) exposure to other viral communicable diseases: Secondary | ICD-10-CM | POA: Insufficient documentation

## 2018-11-07 LAB — NOVEL CORONAVIRUS, NAA (HOSP ORDER, SEND-OUT TO REF LAB; TAT 18-24 HRS): SARS-CoV-2, NAA: NOT DETECTED

## 2018-11-08 ENCOUNTER — Other Ambulatory Visit: Payer: Self-pay

## 2018-11-08 ENCOUNTER — Encounter (HOSPITAL_COMMUNITY): Payer: Self-pay | Admitting: *Deleted

## 2018-11-08 NOTE — Progress Notes (Signed)
Spoke with pt for pre-op call. Pt denies cardiac history, HTN or Diabetes.   Pt had Covid test done on 11/06/18 and it's negative. Pt states she has been in quarantine since the test.  Pt made aware of visitation policy and voiced understanding.

## 2018-11-09 ENCOUNTER — Ambulatory Visit (HOSPITAL_COMMUNITY): Payer: BC Managed Care – PPO | Admitting: Anesthesiology

## 2018-11-09 ENCOUNTER — Encounter (HOSPITAL_COMMUNITY): Payer: Self-pay

## 2018-11-09 ENCOUNTER — Ambulatory Visit (HOSPITAL_COMMUNITY)
Admission: RE | Admit: 2018-11-09 | Discharge: 2018-11-09 | Disposition: A | Discharge status: Home / Self Care | Payer: BC Managed Care – PPO | Attending: General Surgery | Admitting: General Surgery

## 2018-11-09 ENCOUNTER — Other Ambulatory Visit: Payer: Self-pay

## 2018-11-09 ENCOUNTER — Encounter (HOSPITAL_COMMUNITY)
Admission: RE | Disposition: A | Discharge status: Home / Self Care | Payer: Self-pay | Source: Home / Self Care | Attending: General Surgery

## 2018-11-09 DIAGNOSIS — Z96653 Presence of artificial knee joint, bilateral: Secondary | ICD-10-CM | POA: Diagnosis not present

## 2018-11-09 DIAGNOSIS — M1990 Unspecified osteoarthritis, unspecified site: Secondary | ICD-10-CM | POA: Insufficient documentation

## 2018-11-09 DIAGNOSIS — Z6838 Body mass index (BMI) 38.0-38.9, adult: Secondary | ICD-10-CM | POA: Insufficient documentation

## 2018-11-09 DIAGNOSIS — D649 Anemia, unspecified: Secondary | ICD-10-CM | POA: Diagnosis not present

## 2018-11-09 DIAGNOSIS — E669 Obesity, unspecified: Secondary | ICD-10-CM | POA: Diagnosis not present

## 2018-11-09 DIAGNOSIS — R590 Localized enlarged lymph nodes: Secondary | ICD-10-CM | POA: Insufficient documentation

## 2018-11-09 DIAGNOSIS — Z9884 Bariatric surgery status: Secondary | ICD-10-CM | POA: Insufficient documentation

## 2018-11-09 DIAGNOSIS — Z9842 Cataract extraction status, left eye: Secondary | ICD-10-CM | POA: Diagnosis not present

## 2018-11-09 DIAGNOSIS — Z9841 Cataract extraction status, right eye: Secondary | ICD-10-CM | POA: Insufficient documentation

## 2018-11-09 DIAGNOSIS — Z79899 Other long term (current) drug therapy: Secondary | ICD-10-CM | POA: Insufficient documentation

## 2018-11-09 DIAGNOSIS — K219 Gastro-esophageal reflux disease without esophagitis: Secondary | ICD-10-CM | POA: Diagnosis not present

## 2018-11-09 DIAGNOSIS — L929 Granulomatous disorder of the skin and subcutaneous tissue, unspecified: Secondary | ICD-10-CM | POA: Diagnosis not present

## 2018-11-09 DIAGNOSIS — R591 Generalized enlarged lymph nodes: Secondary | ICD-10-CM | POA: Diagnosis present

## 2018-11-09 DIAGNOSIS — I10 Essential (primary) hypertension: Secondary | ICD-10-CM | POA: Diagnosis not present

## 2018-11-09 DIAGNOSIS — N3010 Interstitial cystitis (chronic) without hematuria: Secondary | ICD-10-CM | POA: Insufficient documentation

## 2018-11-09 DIAGNOSIS — M7989 Other specified soft tissue disorders: Secondary | ICD-10-CM | POA: Diagnosis not present

## 2018-11-09 DIAGNOSIS — F419 Anxiety disorder, unspecified: Secondary | ICD-10-CM | POA: Insufficient documentation

## 2018-11-09 DIAGNOSIS — E041 Nontoxic single thyroid nodule: Secondary | ICD-10-CM | POA: Diagnosis not present

## 2018-11-09 HISTORY — DX: Personal history of urinary calculi: Z87.442

## 2018-11-09 HISTORY — DX: Generalized enlarged lymph nodes: R59.1

## 2018-11-09 HISTORY — PX: LYMPH NODE DISSECTION: SHX5087

## 2018-11-09 LAB — CBC WITH DIFFERENTIAL/PLATELET
Abs Immature Granulocytes: 0.01 10*3/uL (ref 0.00–0.07)
Basophils Absolute: 0 10*3/uL (ref 0.0–0.1)
Basophils Relative: 1 %
Eosinophils Absolute: 0.2 10*3/uL (ref 0.0–0.5)
Eosinophils Relative: 4 %
HCT: 37.5 % (ref 36.0–46.0)
Hemoglobin: 12.5 g/dL (ref 12.0–15.0)
Immature Granulocytes: 0 %
Lymphocytes Relative: 23 %
Lymphs Abs: 0.9 10*3/uL (ref 0.7–4.0)
MCH: 33.2 pg (ref 26.0–34.0)
MCHC: 33.3 g/dL (ref 30.0–36.0)
MCV: 99.7 fL (ref 80.0–100.0)
Monocytes Absolute: 0.4 10*3/uL (ref 0.1–1.0)
Monocytes Relative: 9 %
Neutro Abs: 2.5 10*3/uL (ref 1.7–7.7)
Neutrophils Relative %: 63 %
Platelets: 253 10*3/uL (ref 150–400)
RBC: 3.76 MIL/uL — ABNORMAL LOW (ref 3.87–5.11)
RDW: 15.2 % (ref 11.5–15.5)
WBC: 4 10*3/uL (ref 4.0–10.5)
nRBC: 0 % (ref 0.0–0.2)

## 2018-11-09 LAB — COMPREHENSIVE METABOLIC PANEL
ALT: 21 U/L (ref 0–44)
AST: 43 U/L — ABNORMAL HIGH (ref 15–41)
Albumin: 3.7 g/dL (ref 3.5–5.0)
Alkaline Phosphatase: 117 U/L (ref 38–126)
Anion gap: 9 (ref 5–15)
BUN: 13 mg/dL (ref 8–23)
CO2: 24 mmol/L (ref 22–32)
Calcium: 8.7 mg/dL — ABNORMAL LOW (ref 8.9–10.3)
Chloride: 107 mmol/L (ref 98–111)
Creatinine, Ser: 0.56 mg/dL (ref 0.44–1.00)
GFR calc Af Amer: >60 mL/min (ref >60)
GFR calc non Af Amer: >60 mL/min (ref >60)
Glucose, Bld: 93 mg/dL (ref 70–99)
Potassium: 4.8 mmol/L (ref 3.5–5.1)
Sodium: 140 mmol/L (ref 135–145)
Total Bilirubin: 0.8 mg/dL (ref 0.3–1.2)
Total Protein: 6.5 g/dL (ref 6.5–8.1)

## 2018-11-09 SURGERY — LYMPH NODE DISSECTION
Anesthesia: General | Laterality: Right

## 2018-11-09 MED ORDER — CHLORHEXIDINE GLUCONATE CLOTH 2 % EX PADS
6.0000 | MEDICATED_PAD | Freq: Once | CUTANEOUS | Status: AC
  Administered 2018-11-09: 6 via TOPICAL

## 2018-11-09 MED ORDER — PROMETHAZINE HCL 25 MG/ML IJ SOLN: 6.2500 mg | INTRAMUSCULAR | Status: DC | PRN

## 2018-11-09 MED ORDER — HYDROCODONE-ACETAMINOPHEN 5-325 MG PO TABS: 1.0000 | ORAL_TABLET | Freq: Four times a day (QID) | ORAL | 0 refills | Status: DC | PRN

## 2018-11-09 MED ORDER — ACETAMINOPHEN 500 MG PO TABS
ORAL_TABLET | ORAL | Status: AC
  Administered 2018-11-09: 1000 mg via ORAL
  Filled 2018-11-09: qty 2

## 2018-11-09 MED ORDER — PHENYLEPHRINE 40 MCG/ML (10ML) SYRINGE FOR IV PUSH (FOR BLOOD PRESSURE SUPPORT)
PREFILLED_SYRINGE | INTRAVENOUS | Status: DC | PRN
  Administered 2018-11-09: 80 ug via INTRAVENOUS
  Administered 2018-11-09: 120 ug via INTRAVENOUS
  Administered 2018-11-09: 80 ug via INTRAVENOUS

## 2018-11-09 MED ORDER — CEFAZOLIN SODIUM-DEXTROSE 2-4 GM/100ML-% IV SOLN
2.0000 g | INTRAVENOUS | Status: AC
  Administered 2018-11-09: 12:00:00 2 g via INTRAVENOUS

## 2018-11-09 MED ORDER — BUPIVACAINE-EPINEPHRINE (PF) 0.5% -1:200000 IJ SOLN
INTRAMUSCULAR | Status: AC
  Filled 2018-11-09: qty 30

## 2018-11-09 MED ORDER — ACETAMINOPHEN 500 MG PO TABS
1000.0000 mg | ORAL_TABLET | ORAL | Status: AC
  Administered 2018-11-09: 10:00:00 1000 mg via ORAL

## 2018-11-09 MED ORDER — CEFAZOLIN SODIUM-DEXTROSE 2-4 GM/100ML-% IV SOLN
INTRAVENOUS | Status: AC
  Filled 2018-11-09: qty 100

## 2018-11-09 MED ORDER — LIDOCAINE 2% (20 MG/ML) 5 ML SYRINGE
INTRAMUSCULAR | Status: DC | PRN
  Administered 2018-11-09: 60 mg via INTRAVENOUS

## 2018-11-09 MED ORDER — OXYCODONE HCL 5 MG PO TABS: 5.0000 mg | ORAL_TABLET | Freq: Once | ORAL | Status: DC | PRN

## 2018-11-09 MED ORDER — HYDROMORPHONE HCL 1 MG/ML IJ SOLN
0.2500 mg | INTRAMUSCULAR | Status: DC | PRN
  Administered 2018-11-09 (×2): 0.5 mg via INTRAVENOUS

## 2018-11-09 MED ORDER — HYDROMORPHONE HCL 1 MG/ML IJ SOLN
INTRAMUSCULAR | Status: AC
  Filled 2018-11-09: qty 1

## 2018-11-09 MED ORDER — PROPOFOL 10 MG/ML IV BOLUS
INTRAVENOUS | Status: DC | PRN
  Administered 2018-11-09: 200 mg via INTRAVENOUS

## 2018-11-09 MED ORDER — GABAPENTIN 300 MG PO CAPS
ORAL_CAPSULE | ORAL | Status: AC
  Administered 2018-11-09: 300 mg via ORAL
  Filled 2018-11-09: qty 1

## 2018-11-09 MED ORDER — FENTANYL CITRATE (PF) 250 MCG/5ML IJ SOLN
INTRAMUSCULAR | Status: AC
  Filled 2018-11-09: qty 5

## 2018-11-09 MED ORDER — BUPIVACAINE-EPINEPHRINE (PF) 0.5% -1:200000 IJ SOLN
INTRAMUSCULAR | Status: DC | PRN
  Administered 2018-11-09: 30 mL via PERINEURAL

## 2018-11-09 MED ORDER — CELECOXIB 200 MG PO CAPS
200.0000 mg | ORAL_CAPSULE | ORAL | Status: AC
  Administered 2018-11-09: 10:00:00 200 mg via ORAL

## 2018-11-09 MED ORDER — CELECOXIB 200 MG PO CAPS
ORAL_CAPSULE | ORAL | Status: AC
  Administered 2018-11-09: 200 mg via ORAL
  Filled 2018-11-09: qty 1

## 2018-11-09 MED ORDER — GABAPENTIN 300 MG PO CAPS
300.0000 mg | ORAL_CAPSULE | ORAL | Status: AC
  Administered 2018-11-09: 10:00:00 300 mg via ORAL

## 2018-11-09 MED ORDER — MIDAZOLAM HCL 2 MG/2ML IJ SOLN
INTRAMUSCULAR | Status: AC
  Filled 2018-11-09: qty 2

## 2018-11-09 MED ORDER — GLYCOPYRROLATE PF 0.2 MG/ML IJ SOSY
PREFILLED_SYRINGE | INTRAMUSCULAR | Status: DC | PRN
  Administered 2018-11-09 (×2): .1 mg via INTRAVENOUS

## 2018-11-09 MED ORDER — PROPOFOL 10 MG/ML IV BOLUS
INTRAVENOUS | Status: AC
  Filled 2018-11-09: qty 20

## 2018-11-09 MED ORDER — OXYCODONE HCL 5 MG/5ML PO SOLN: 5.0000 mg | Freq: Once | ORAL | Status: DC | PRN

## 2018-11-09 MED ORDER — LACTATED RINGERS IV SOLN
INTRAVENOUS | Status: DC
  Administered 2018-11-09: 10:00:00 via INTRAVENOUS

## 2018-11-09 MED ORDER — EPHEDRINE SULFATE-NACL 50-0.9 MG/10ML-% IV SOSY
PREFILLED_SYRINGE | INTRAVENOUS | Status: DC | PRN
  Administered 2018-11-09 (×2): 10 mg via INTRAVENOUS

## 2018-11-09 MED ORDER — MIDAZOLAM HCL 2 MG/2ML IJ SOLN
INTRAMUSCULAR | Status: DC | PRN
  Administered 2018-11-09: 2 mg via INTRAVENOUS

## 2018-11-09 MED ORDER — SODIUM CHLORIDE 0.9 % IV SOLN
INTRAVENOUS | Status: DC | PRN
  Administered 2018-11-09: 12:00:00 50 ug/min via INTRAVENOUS

## 2018-11-09 MED ORDER — ONDANSETRON HCL 4 MG/2ML IJ SOLN
INTRAMUSCULAR | Status: DC | PRN
  Administered 2018-11-09: 4 mg via INTRAVENOUS

## 2018-11-09 MED ORDER — FENTANYL CITRATE (PF) 250 MCG/5ML IJ SOLN
INTRAMUSCULAR | Status: DC | PRN
  Administered 2018-11-09: 25 ug via INTRAVENOUS

## 2018-11-09 SURGICAL SUPPLY — 32 items
APPLIER CLIP 9.375 MED OPEN (MISCELLANEOUS) ×2
CHLORAPREP W/TINT 26 (MISCELLANEOUS) ×2 IMPLANT
CLIP APPLIE 9.375 MED OPEN (MISCELLANEOUS) ×1 IMPLANT
COVER SURGICAL LIGHT HANDLE (MISCELLANEOUS) ×2 IMPLANT
COVER WAND RF STERILE (DRAPES) ×2 IMPLANT
DERMABOND ADHESIVE PROPEN (GAUZE/BANDAGES/DRESSINGS) ×1
DERMABOND ADVANCED (GAUZE/BANDAGES/DRESSINGS) ×1
DERMABOND ADVANCED .7 DNX12 (GAUZE/BANDAGES/DRESSINGS) ×1 IMPLANT
DERMABOND ADVANCED .7 DNX6 (GAUZE/BANDAGES/DRESSINGS) IMPLANT
DRAIN CHANNEL 19F RND (DRAIN) ×2 IMPLANT
DRAPE CHEST BREAST 15X10 FENES (DRAPES) ×2 IMPLANT
ELECT REM PT RETURN 9FT ADLT (ELECTROSURGICAL) ×2
ELECTRODE REM PT RTRN 9FT ADLT (ELECTROSURGICAL) ×1 IMPLANT
EVACUATOR SILICONE 100CC (DRAIN) ×2 IMPLANT
GAUZE SPONGE 4X4 12PLY STRL (GAUZE/BANDAGES/DRESSINGS) ×2 IMPLANT
GLOVE SS BIOGEL STRL SZ 7 (GLOVE) ×1 IMPLANT
GLOVE SUPERSENSE BIOGEL SZ 7 (GLOVE) ×1
GOWN STRL REUS W/ TWL LRG LVL3 (GOWN DISPOSABLE) ×2 IMPLANT
GOWN STRL REUS W/TWL LRG LVL3 (GOWN DISPOSABLE) ×2
KIT BASIN OR (CUSTOM PROCEDURE TRAY) ×2 IMPLANT
KIT TURNOVER KIT B (KITS) ×2 IMPLANT
NDL HYPO 25GX1X1/2 BEV (NEEDLE) IMPLANT
NEEDLE HYPO 25GX1X1/2 BEV (NEEDLE) IMPLANT
NS IRRIG 1000ML POUR BTL (IV SOLUTION) ×2 IMPLANT
PACK GENERAL/GYN (CUSTOM PROCEDURE TRAY) ×2 IMPLANT
PAD ARMBOARD 7.5X6 YLW CONV (MISCELLANEOUS) ×2 IMPLANT
PENCIL SMOKE EVACUATOR (MISCELLANEOUS) ×2 IMPLANT
SUT ETHILON 2 0 FS 18 (SUTURE) ×2 IMPLANT
SUT MON AB 4-0 PC3 18 (SUTURE) ×2 IMPLANT
SUT VIC AB 3-0 SH 18 (SUTURE) ×2 IMPLANT
TOWEL GREEN STERILE (TOWEL DISPOSABLE) ×2 IMPLANT
TOWEL GREEN STERILE FF (TOWEL DISPOSABLE) ×2 IMPLANT

## 2018-11-09 NOTE — Discharge Instructions (Signed)
We will call the pathology report to you next week, hopefully by Wednesday  Keep the wound clean and dry for the most part You may take a shower starting tomorrow The clear plastic superglue will wear off in about 3 weeks You will be given an ice pack.  Keep that eye on the wound for 10 minutes at a time, 2 or 3 times an hour for a day or 2  You may drive your car tomorrow if you are not taking narcotic pain medicine         Managing Your Pain After Surgery Without Opioids    Thank you for participating in our program to help patients manage their pain after surgery without opioids. This is part of our effort to provide you with the best care possible, without exposing you or your family to the risk that opioids pose.  What pain can I expect after surgery? You can expect to have some pain after surgery. This is normal. The pain is typically worse the day after surgery, and quickly begins to get better. Many studies have found that many patients are able to manage their pain after surgery with Over-the-Counter (OTC) medications such as Tylenol and Motrin. If you have a condition that does not allow you to take Tylenol or Motrin, notify your surgical team.  How will I manage my pain? The best strategy for controlling your pain after surgery is around the clock pain control with Tylenol (acetaminophen) and Motrin (ibuprofen or Advil). Alternating these medications with each other allows you to maximize your pain control. In addition to Tylenol and Motrin, you can use heating pads or ice packs on your incisions to help reduce your pain.  How will I alternate your regular strength over-the-counter pain medication? You will take a dose of pain medication every three hours. ; Start by taking 650 mg of Tylenol (2 pills of 325 mg) ; 3 hours later take 600 mg of Motrin (3 pills of 200 mg) ; 3 hours after taking the Motrin take 650 mg of Tylenol ; 3 hours after that take 600 mg of  Motrin.   - 1 -  See example - if your first dose of Tylenol is at 12:00 PM   12:00 PM Tylenol 650 mg (2 pills of 325 mg)  3:00 PM Motrin 600 mg (3 pills of 200 mg)  6:00 PM Tylenol 650 mg (2 pills of 325 mg)  9:00 PM Motrin 600 mg (3 pills of 200 mg)  Continue alternating every 3 hours   We recommend that you follow this schedule around-the-clock for at least 3 days after surgery, or until you feel that it is no longer needed. Use the table on the last page of this handout to keep track of the medications you are taking. Important: Do not take more than 3000mg  of Tylenol or 3200mg  of Motrin in a 24-hour period. Do not take ibuprofen/Motrin if you have a history of bleeding stomach ulcers, severe kidney disease, &/or actively taking a blood thinner  What if I still have pain? If you have pain that is not controlled with the over-the-counter pain medications (Tylenol and Motrin or Advil) you might have what we call breakthrough pain. You will receive a prescription for a small amount of an opioid pain medication such as Oxycodone, Tramadol, or Tylenol with Codeine. Use these opioid pills in the first 24 hours after surgery if you have breakthrough pain. Do not take more than 1 pill every 4-6 hours.  If you still have uncontrolled pain after using all opioid pills, don't hesitate to call our staff using the number provided. We will help make sure you are managing your pain in the best way possible, and if necessary, we can provide a prescription for additional pain medication.   Day 1    Time  Name of Medication Number of pills taken  Amount of Acetaminophen  Pain Level   Comments  AM PM       AM PM       AM PM       AM PM       AM PM       AM PM       AM PM       AM PM       Total Daily amount of Acetaminophen Do not take more than  3,000 mg per day      Day 2    Time  Name of Medication Number of pills taken  Amount of Acetaminophen  Pain Level   Comments  AM  PM       AM PM       AM PM       AM PM       AM PM       AM PM       AM PM       AM PM       Total Daily amount of Acetaminophen Do not take more than  3,000 mg per day      Day 3    Time  Name of Medication Number of pills taken  Amount of Acetaminophen  Pain Level   Comments  AM PM       AM PM       AM PM       AM PM          AM PM       AM PM       AM PM       AM PM       Total Daily amount of Acetaminophen Do not take more than  3,000 mg per day      Day 4    Time  Name of Medication Number of pills taken  Amount of Acetaminophen  Pain Level   Comments  AM PM       AM PM       AM PM       AM PM       AM PM       AM PM       AM PM       AM PM       Total Daily amount of Acetaminophen Do not take more than  3,000 mg per day      Day 5    Time  Name of Medication Number of pills taken  Amount of Acetaminophen  Pain Level   Comments  AM PM       AM PM       AM PM       AM PM       AM PM       AM PM       AM PM       AM PM       Total Daily amount of Acetaminophen Do not take more than  3,000 mg per day  Day 6    Time  Name of Medication Number of pills taken  Amount of Acetaminophen  Pain Level  Comments  AM PM       AM PM       AM PM       AM PM       AM PM       AM PM       AM PM       AM PM       Total Daily amount of Acetaminophen Do not take more than  3,000 mg per day      Day 7    Time  Name of Medication Number of pills taken  Amount of Acetaminophen  Pain Level   Comments  AM PM       AM PM       AM PM       AM PM       AM PM       AM PM       AM PM       AM PM       Total Daily amount of Acetaminophen Do not take more than  3,000 mg per day        For additional information about how and where to safely dispose of unused opioid medications - [employer]  Disclaimer: This document contains information and/or instructional materials adapted from [place]  for the typical patient with your condition. It does not replace medical advice from your health care provider because your experience may differ from that of the typical patient. Talk to your health care provider if you have any questions about this document, your condition or your treatment plan. Adapted from [place]

## 2018-11-09 NOTE — Op Note (Signed)
Patient Name:           [name] [name]   Date of Surgery:        11/09/2018  Pre op Diagnosis:      Lymphadenopathy, sarcoidosis versus lymphoma.  Post op Diagnosis:    Same  Procedure:                 Excision multiple deep right cervical lymph nodes  Surgeon:                     Edsel Petrin. Dalbert Batman, M.D., FACS  Assistant:                      Or staff  Operative Indications:   This is a very pleasant 63 year old female, referred by Dr. Irene Limbo at the cancer center for consideration of lymph node biopsy to rule out lymphoma. [name] is her orthopedic surgeon. [name], NP provides primary care. [name] was my chaperone for the encounter.      The patient has had numerous orthopedic problems. She is being evaluated for shoulder problems and Dr. Tonita Cong and MRI of which showed osseous lesions in the proximal humerus consistent with multiple myeloma or metastatic cancer. She had lots of blood work which was normal. Subsequent image guided right supraclavicular lymph node biopsy showed noncaseating granulomata consistent with sarcoidosis PET scan showed numerous hypermetabolic lymph nodes in the neck, chest, abdomen, pelvis, lungs, liver, spleen, and bones, suspicious for lymphoma. I have reviewed these images in detail. .Dr. Irene Limbo Wanted a peripheral lymph node biopsy to be sure she did not have lymphoma before treating her for sarcoidosis.      Physical exam revealed a 1 cm palpable nodule in the superficial right neck posterior triangle she says this came up after the image guided biopsy. No other dramatic lymph nodes in the neck or axilla. She has a palpable lymph node in the right groin 2 cm or less. Significant obesity     It seems like the most accessible lymph node basin is the right neck posterior triangle and so will go there first.       She'll be scheduled for excision deep right cervical lymph node, posterior triangle under general anesthesia in the near future.She  agrees with this plan.  Operative Findings:       There was a lymph node in the right posterior triangle just under the dermis which was excised.  There were multiple nodules deep to the platysma muscle which appeared to be lymph nodes.  I excised 2 of these.  Frozen section evaluation of a small portion of this showed extensive granulomas.  They said that they had plenty of tissue and would submit this for multiple studies.  I discussed Dr. Brennan Bailey concern with the pathologist.  Procedure in Detail:          Following the induction of general LMA anesthesia the patient was positioned with her arms tucked at her sides and a roll behind her shoulders.  Her neck was turned to the left and the table positioned to expose the right neck and posterior triangle.  The right neck was then prepped and draped in a sterile fashion.  Intravenous antibiotics were given.  Surgical timeout was performed.  0.5% Marcaine with epinephrine was used as a local infiltration anesthetic.    A transverse incision was made in the right neck in Langer's lines about 2 cm above the clavicle.  I  carried the incision as an ellipse around the superficial nodule and sent that to the lab.  I could then palpate other nodules and incised the platysma muscle and placed retractors to expose the deeper cervical tissues.  2 other nodules were dissected away one lateral to the sternocleidomastoid muscle and one behind it.  These were sent to the lab and the pathologist took a look and thought this was all granulomatous lymph nodes.  He said that he had more than adequate tissue.  The wound was irrigated.  Hemostasis was excellent.  Platysma muscle was closed with interrupted 3-0 Vicryl sutures and the skin closed with a running subcuticular 4-0 Monocryl and Dermabond.  The patient tolerated the procedure well and was taken to PACU in stable condition.  EBL 10 cc.  Counts correct.  Complications none.    Addendum: I logged onto the PMP aware  website and reviewed her prescription medication history     Wilferd Ritson M. Dalbert Batman, M.D., FACS General and Minimally Invasive Surgery Breast and Colorectal Surgery  11/09/2018 12:29 PM

## 2018-11-09 NOTE — Anesthesia Preprocedure Evaluation (Addendum)
Anesthesia Evaluation  Patient identified by MRN, date of birth, ID band Patient awake    Reviewed: Allergy & Precautions, NPO status , Patient's Chart, lab work & pertinent test results  Airway Mallampati: II  TM Distance: >3 FB Neck ROM: Full    Dental no notable dental hx.    Pulmonary neg pulmonary ROS,    Pulmonary exam normal breath sounds clear to auscultation       Cardiovascular negative cardio ROS Normal cardiovascular exam Rhythm:Regular Rate:Normal     Neuro/Psych Anxiety negative neurological ROS     GI/Hepatic Neg liver ROS, GERD  Medicated and Controlled,  Endo/Other  negative endocrine ROS  Renal/GU negative Renal ROS     Musculoskeletal   Abdominal (+) + obese,   Peds  Hematology negative hematology ROS (+)   Anesthesia Other Findings diffuse lymphadenopathy  Reproductive/Obstetrics                            Anesthesia Physical Anesthesia Plan  ASA: II  Anesthesia Plan: General   Post-op Pain Management:    Induction: Intravenous  PONV Risk Score and Plan: 3 and Ondansetron, Dexamethasone, Treatment may vary due to age or medical condition and Midazolam  Airway Management Planned: Oral ETT  Additional Equipment:   Intra-op Plan:   Post-operative Plan: Extubation in OR  Informed Consent: I have reviewed the patients History and Physical, chart, labs and discussed the procedure including the risks, benefits and alternatives for the proposed anesthesia with the patient or authorized representative who has indicated his/her understanding and acceptance.     Dental advisory given  Plan Discussed with: CRNA  Anesthesia Plan Comments:       Anesthesia Quick Evaluation

## 2018-11-09 NOTE — Anesthesia Postprocedure Evaluation (Signed)
Anesthesia Post Note  Patient: [name] [name]  Procedure(s) Performed: EXCISE DEEP RIGHT CERVICAL LYMPH NODE (Right )     Patient location during evaluation: PACU Anesthesia Type: General Level of consciousness: awake and alert Pain management: pain level controlled Vital Signs Assessment: post-procedure vital signs reviewed and stable Respiratory status: spontaneous breathing, nonlabored ventilation, respiratory function stable and patient connected to nasal cannula oxygen Cardiovascular status: blood pressure returned to baseline and stable Postop Assessment: no apparent nausea or vomiting Anesthetic complications: no    Last Vitals:  Vitals:   11/09/18 1308 11/09/18 1324  BP: 136/75 (!) 144/84  Pulse: 88 81  Resp: 18 17  Temp:    SpO2: 100% 93%    Last Pain:  Vitals:   11/09/18 1345  TempSrc:   PainSc: 3                  Ryan P Ellender

## 2018-11-09 NOTE — Interval H&P Note (Signed)
History and Physical Interval Note:  11/09/2018 10:25 AM  [name] [name]  has presented today for surgery, with the diagnosis of diffuse lymphadenopathy.  The various methods of treatment have been discussed with the patient and family. After consideration of risks, benefits and other options for treatment, the patient has consented to  Procedure(s): EXCISE DEEP RIGHT CERVICAL LYMPH NODE (Right) as a surgical intervention.  The patient's history has been reviewed, patient examined, no change in status, stable for surgery.  I have reviewed the patient's chart and labs.  Questions were answered to the patient's satisfaction.     Adin Hector

## 2018-11-09 NOTE — Anesthesia Procedure Notes (Signed)
Procedure Name: LMA Insertion Performed by: Beckner, Alisha B, CRNA Pre-anesthesia Checklist: Patient identified, Emergency Drugs available, Suction available and Patient being monitored Patient Re-evaluated:Patient Re-evaluated prior to induction Oxygen Delivery Method: Circle System Utilized Preoxygenation: Pre-oxygenation with 100% oxygen Induction Type: IV induction Ventilation: Mask ventilation without difficulty LMA: LMA inserted LMA Size: 4.0 Number of attempts: 1 Airway Equipment and Method: Bite block Placement Confirmation: positive ETCO2 Tube secured with: Tape Dental Injury: Teeth and Oropharynx as per pre-operative assessment        

## 2018-11-09 NOTE — Transfer of Care (Signed)
Immediate Anesthesia Transfer of Care Note  Patient: [name] [name]  Procedure(s) Performed: EXCISE DEEP RIGHT CERVICAL LYMPH NODE (Right )  Patient Location: PACU  Anesthesia Type:General  Level of Consciousness: awake, alert  and oriented  Airway & Oxygen Therapy: Patient Spontanous Breathing and Patient connected to face mask oxygen  Post-op Assessment: Report given to RN and Post -op Vital signs reviewed and stable  Post vital signs: Reviewed and stable  Last Vitals:  Vitals Value Taken Time  BP 147/79 11/09/18 1239  Temp    Pulse 90 11/09/18 1239  Resp 18 11/09/18 1239  SpO2 100 % 11/09/18 1239  Vitals shown include unvalidated device data.  Last Pain:  Vitals:   11/09/18 0936  TempSrc:   PainSc: 4       Patients Stated Pain Goal: 3 ([id number])  Complications: No apparent anesthesia complications

## 2018-11-10 ENCOUNTER — Encounter (HOSPITAL_COMMUNITY): Payer: Self-pay | Admitting: General Surgery

## 2018-11-10 LAB — ACID FAST SMEAR (AFB, MYCOBACTERIA): Acid Fast Smear: NEGATIVE

## 2018-11-11 DIAGNOSIS — R239 Unspecified skin changes: Secondary | ICD-10-CM | POA: Diagnosis not present

## 2018-11-12 LAB — SURGICAL PATHOLOGY

## 2018-11-13 ENCOUNTER — Encounter: Payer: Self-pay | Admitting: Gynecology

## 2018-11-16 NOTE — Progress Notes (Signed)
HEMATOLOGY/ONCOLOGY CLINIC NOTE  Date of Service: 11/21/2018  Patient Care Team: [name], NP as PCP - General (Internal Medicine)  CHIEF COMPLAINTS/PURPOSE OF CONSULTATION:  Newly diagnosed sarcoidosis  HISTORY OF PRESENTING ILLNESS:   [name] [name] is a wonderful 63 y.o. female who has been referred to [place] by Dr. [name] from [facility] for evaluation and management of multiple myeloma.The pt reports that she is doing well overall.   The pt reports that she saw Dr. Tonita Cong for shoulder pain and he wanted to order an MRI. She got a shot of cortisone and Dr. Tonita Cong called the next morning and said that she had bone lesions in her left shoulder. She has not had any issues with her left shoulder until a year and a half ago, and she thinks the pain is due to picking up her grandchild. She reports a new pain in her left hip/bottom that began within the past 6 months.  She had a bariatric surgery in 2005 and has been taking iron pills since then. She also takes a multivitamin, OTC Vit D, Vit B12, zinc, Vit A, and Vit C. She has also been on acid suppressants for reflux. She takes Allegra, Omeprazole, and Flonase for her allergies. She takes Xanax before bedtime for her anxiety.  She sees a Dr. At [facility] for interstitial cystitis. She feels more discomfort when she drinks soda. Normally, she drinks mostly water. She drinks one cup of coffee daily and rarely drinks alcohol. Pt has not had a cystoscopy. Denies vaginal bleeding or abnormal discharge.  Of note prior to the patient's visit today, pt has had 3D mammogram completed on 07/11/2018 with results revealing "breast density category B, no mammographic evidence of malignancy."   She also had a shoulder MRI wo contrast on 08/28/2018, which revealed "1. Multifocal small osseous lesions in the proximal humerus and a single similar lesion in the glenoid, highly suspicious for malignancy, such as multiple myeloma or  metastatic disease. 2. Full-thickness, full width teat of the suprespinatus tendon at the greater tuberosity with about 4cm tendon retrac ion. Mild supraspinatus muscle atrophy. 3. Mild diffuse subscapularis and infraspinatus tendinosis. Minimal interstitial tearing of the infraspinatus tendon. Moderate to sever diffuse infraspinatus msucle atrophy. 4. Moderate to severe diffuse atrophy of the teres minor muscle likely related to chronic denervation. No mass in the quadrilateral space. 5. 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 6. Mild lateral downsloping of the acromion and a mild subacromial enthesopathy. 7. Mild glenohumeral athrosis. 8. Degeneration and probable tear of the superior labrum. Associated small intraosseus ganglion and marrow edema in the adjacent glenoid."  Most recent lab results (09/04/2018) of CBC is as follows: all values are WNL. 09/04/2018 TSH at 1.03 40/98/1191 basal metabolic panel all WNL 47/82/9562 hepatic function panel all WNL 09/04/2018 lipid panel all WNL 09/04/2018 protein electrophoresis is pending  On review of systems, pt reports left shoulder pain, right hip/bottom pain and denies fever, chills, night sweats, unexpected weight loss, changes in bowel habits, and any other symptoms.   On PMHx the pt reports hx of 2 knee replacements, arthritis, allergies, anxiety On Social Hx the pt reports rare alcohol usage and has never smoked cigarettes On Family Hx the pt reports that her dad was diagnosed with prostate cancer at 62 y.o. and her brother was diagnosed with testicular cancer in his mid 34s-40s.   Interval History  [name] [name] is a 63 y.o. female presenting today for the management and  evaluation of bone lesions anf LNadenopathy. The patient's last visit with [place] was on 10/04/2018. The pt reports that she is doing well overall.  The pt reports that she has felt well in the interim and reports no new concerns. Pt has been cutting back on  sugar and trying to increase her anti-inflammatory food intake. Pt has lost about 12 lbs because of this diet change. She reports that she can feel a wheezing sensation and begins to cough when she sits up, especially early in the morning. She is not having issues with SOB when she walks or exerts herself. Pt has issues with Prednisone keeping her up at night when she doesn't take her entire prescription in the morning. The last time she remembers being on Prednisone was about a year ago when she had a bad ear infection. Pt is supposed to see her PCP NP [name] in a month.   Of note since the patient's last visit, pt has had excisional Lymph Node Biopsy ([id number]) completed on 11/09/2018 with results revealing "SOFT TISSUE MASS, RIGHT NECK, EXCISION: - Granulomatous inflammation, with patchy/stellate necrosis, involving fibroconnective tissue and skeletal muscle. See comment - Lymph node tissue is not identified. - No evidence of malignancy."  Lab results (11/09/18) of CBC w/diff and CMP is as follows: all values are WNL except for RBC at 3.76, Calcium at 8.7, AST at 43. 11/09/18 Acid Fast Smear is "Negative" 11/09/18 Fungus Culture result is "no fungus observed"  On review of systems, pt reports cough, problems breathing, foot swelling when she sits up and denies problems swallowing, abdominal pain and any other symptoms.   MEDICAL HISTORY:  Past Medical History:  Diagnosis Date  . Allergy   . Anemia    takes iron supplement  . Anxiety   . Arthritis   . 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 Elevated alkaline phosphatase level    negative workup (presumed secondary to fatty liver)  . Frequency-urgency syndrome   . GERD (gastroesophageal reflux disease)    none since weight loss surgery  . History of kidney stones    seen on Pet scan  . Interstitial cystitis    Dr Vernie Shanks  . Lymphadenopathy of head and neck 11/09/2018  . Neuromuscular disorder (HCC)    rt arm carpal tunnel  syndrome  . Obesity    status post bariatric procedure in [facility] 2005  . Osteoporosis   . Thyroid nodule 7/10   `  . Thyroid nodule   . Varicose vein of leg     SURGICAL HISTORY: Past Surgical History:  Procedure Laterality Date  . ANTERIOR CERVICAL DECOMP/DISCECTOMY FUSION N/A 06/21/2012   Procedure: ANTERIOR CERVICAL DECOMPRESSION/DISCECTOMY FUSION C-4 - C5 (SPACER/DePUY CERVICAL PLATE ONLY) 1 LEVEL;  Surgeon: Melina Schools, MD;  Location: [place];  Service: Orthopedics;  Laterality: N/A;  . BLADDER SURGERY  02/21/12 and 02/28/12   Dr Diona Fanti  . COLPOSCOPY  1980  . CRYOTHERAPY  1980  . CYSTO WITH HYDRODISTENSION  12/12/2011   Procedure: CYSTOSCOPY/HYDRODISTENSION;  Surgeon: Franchot Gallo, MD;  Location: [facility];  Service: Urology;  Laterality: N/A;  30 MIN   . EYE SURGERY  1997   bilateral cataract extraction  . FOOT SURGERY     bilateral bunionettes  . FOOT SURGERY Right 01/07/14   Pt states surgery was due to arthritis. "Has pins in place now"  . GASTRIC BYPASS  2005  . JOINT REPLACEMENT  2008   left total knee  . KNEE  SURGERY  1997   left knee  . LYMPH NODE DISSECTION Right 11/09/2018   Procedure: EXCISE DEEP RIGHT CERVICAL LYMPH NODE;  Surgeon: Fanny Skates, MD;  Location: [place];  Service: General;  Laterality: Right;  . NASAL SINUS SURGERY  1610   Dr Erik Obey  . REFRACTIVE SURGERY  2006   lasik  . SHOULDER SURGERY  2 2012   RIGHT   . STOMACH SURGERY  2007   "tummy tuck"  . TOTAL KNEE ARTHROPLASTY Right 12/31/2015   Procedure: RIGHT TOTAL KNEE ARTHROPLASTY;  Surgeon: [name], MD;  Location: [facility];  Service: Orthopedics;  Laterality: Right;  Adductor Block  . VARICOSE VEIN SURGERY Right 02/2012   leg    SOCIAL HISTORY: Social History   Socioeconomic History  . Marital status: Divorced    Spouse name: Not on file  . Number of children: 1  . Years of education: Not on file  . Highest education level: Not on file  Occupational  History  . 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[employer]  . Financial resource strain: Not on file  . Food insecurity    Worry: Not on file    Inability: Not on file  . Transportation needs    Medical: Not on file    Non-medical: Not on file  Tobacco Use  . Smoking status: Never Smoker  . Smokeless tobacco: Never Used  Substance and Sexual Activity  . Alcohol use: Yes    Comment: RARE  . Drug use: No  . Sexual activity: Yes    Comment: 1st intercourse 63 yo-More than 5 partners  Lifestyle  . Physical activity    Days per week: Not on file    Minutes per session: Not on file  . Stress: Not on file  Relationships  . Social Herbalist on phone: Not on file    Gets together: Not on file    Attends religious service: Not on file    Active member of club or organization: Not on file    Attends meetings of clubs or organizations: Not on file    Relationship status: Not on file  . Intimate partner violence    Fear of current or ex partner: Not on file    Emotionally abused: Not on file    Physically abused: Not on file    Forced sexual activity: Not on file  Other Topics Concern  . Not on file  Social History Narrative  . Not on file    FAMILY HISTORY: Family History  Problem Relation Age of Onset  . Diabetes Paternal Grandfather   . Stroke Paternal Grandfather   . Hyperlipidemia Mother   . Hypertension Mother   . Hypertension Father   . Diabetes Father   . Prostate cancer Father   . Dementia Father   . Heart disease Maternal Grandmother   . Hypertension Maternal Grandmother   . Heart disease Maternal Grandfather   . Hypertension Maternal Grandfather   . Stroke Maternal Grandfather   . Arthritis Brother   . Testicular cancer Brother   . Colon cancer Neg Hx   . Esophageal cancer Neg Hx   . Pancreatic cancer Neg Hx   . Stomach cancer Neg Hx     ALLERGIES:  is allergic to nitrofurantoin.  MEDICATIONS:  Current Outpatient  Medications  Medication Sig Dispense Refill  . acetaminophen (TYLENOL) 650 MG CR tablet Take 2 tablets (1,300 mg total) by mouth every  8 (eight) hours as needed for pain. Take for mild pain. Do not combine with Percocet. Do not exceed daily recommended limit of Tylenol.    . ALPRAZolam (XANAX) 0.5 MG tablet Take 1 tablet (0.5 mg total) by mouth 2 (two) times daily as needed for anxiety. 60 tablet 1  . amitriptyline (ELAVIL) 50 MG tablet TAKE 1 TABLET BY MOUTH EVERY EVENING  3  . azelastine (ASTELIN) 0.1 % nasal spray Place 1 spray into both nostrils 2 (two) times daily. Use in each nostril as directed (Patient taking differently: Place 1 spray into both nostrils 2 (two) times daily as needed for allergies. Use in each nostril as directed) 30 mL 12  . calcium carbonate (OSCAL) 1500 (600 Ca) MG TABS tablet Take 600 mg of elemental calcium by mouth 2 (two) times daily with a meal.    . Cholecalciferol (VITAMIN D-1000 MAX ST) 25 MCG (1000 UT) tablet Take 1 tablet (1,000 Units total) by mouth daily.    . Ferrous Sulfate (IRON) 325 (65 Fe) MG TABS Take 1 tablet (325 mg total) by mouth daily. 30 each 0  . Fexofenadine HCl (ALLEGRA PO) Take 180 mg by mouth daily.     . fluticasone (FLONASE) 50 MCG/ACT nasal spray Place 2 sprays into both nostrils daily. 16 g 6  . HYDROcodone-acetaminophen (NORCO) 5-325 MG tablet Take 1-2 tablets by mouth every 6 (six) hours as needed for moderate pain or severe pain. 20 tablet 0  . Lifitegrast (XIIDRA) 5 % SOLN Place 1 drop into both eyes 2 (two) times daily.     . Multiple Vitamin (MULTIVITAMIN) tablet Take 1 tablet by mouth daily.     . multivitamin-lutein (OCUVITE-LUTEIN) CAPS capsule Take 1 capsule daily by mouth.    Marland Kitchen MYRBETRIQ 50 MG TB24 tablet Take 50 mg by mouth every evening.   2  . omeprazole (PRILOSEC) 40 MG capsule TAKE 1 CAPSULE BY MOUTH EVERY DAY 90 capsule 0  . OVER THE COUNTER MEDICATION Place 1 drop into both eyes 2 (two) times daily. Allergy eye drops     . PARoxetine (PAXIL) 40 MG tablet Take 1 tablet (40 mg total) by mouth every morning. 30 tablet 2  . valACYclovir (VALTREX) 1000 MG tablet Take 0.5 tablets (500 mg total) by mouth daily. 30 tablet 11  . VITAMIN A PO Take 1 capsule by mouth daily.     . vitamin C (ASCORBIC ACID) 500 MG tablet Take 500 mg by mouth daily.    . Vitamin D, Ergocalciferol, (DRISDOL) 1.25 MG (50000 UT) CAPS capsule Take 1 capsule (50,000 Units total) by mouth every 7 (seven) days. 12 capsule 0  . zinc gluconate 50 MG tablet Take 50 mg by mouth daily.     No current facility-administered medications for this visit.     REVIEW OF SYSTEMS:    A 10+ POINT REVIEW OF SYSTEMS WAS OBTAINED including neurology, dermatology, psychiatry, cardiac, respiratory, lymph, extremities, GI, GU, Musculoskeletal, constitutional, breasts, reproductive, HEENT.  All pertinent positives are noted in the HPI.  All others are negative.   PHYSICAL EXAMINATION: ECOG PERFORMANCE STATUS: 1 - Symptomatic but completely ambulatory  . Vitals:   11/21/18 1428  BP: 137/85  Pulse: 88  Resp: 18  Temp: 98.3 F (36.8 C)  SpO2: 96%   Filed Weights   11/21/18 1428  Weight: 232 lb 3.2 oz (105.3 kg)   .Body mass index is 39.24 kg/m.  Exam was given in a chair   GENERAL:alert, in no acute distress  and comfortable SKIN: no acute rashes, no significant lesions EYES: conjunctiva are pink and non-injected, sclera anicteric OROPHARYNX: MMM, no exudates, no oropharyngeal erythema or ulceration NECK: supple, no JVD LYMPH:  no palpable lymphadenopathy in the cervical, axillary or inguinal regions LUNGS: clear to auscultation b/l with normal respiratory effort HEART: regular rate & rhythm ABDOMEN:  normoactive bowel sounds , non tender, not distended. No palpable hepatosplenomegaly.  Extremity: no pedal edema PSYCH: alert & oriented x 3 with fluent speech NEURO: no focal motor/sensory deficits  LABORATORY DATA:  I have reviewed the data as  listed . CBC Latest Ref Rng & Units 11/09/2018 09/28/2018 09/04/2018  WBC 4.0 - 10.5 K/uL 4.0 4.0 4.7  Hemoglobin 12.0 - 15.0 g/dL 12.5 12.8 12.9  Hematocrit 36.0 - 46.0 % 37.5 39.7 39.5  Platelets 150 - 400 K/uL 253 279 270.0   . CMP Latest Ref Rng & Units 11/09/2018 09/04/2018 09/04/2018  Glucose 70 - 99 mg/dL 93 - 95  BUN 8 - 23 mg/dL 13 - 18  Creatinine 0.44 - 1.00 mg/dL 0.56 - 0.64  Sodium 135 - 145 mmol/L 140 - 139  Potassium 3.5 - 5.1 mmol/L 4.8 - 4.3  Chloride 98 - 111 mmol/L 107 - 101  CO2 22 - 32 mmol/L 24 - 30  Calcium 8.9 - 10.3 mg/dL 8.7(L) - 9.1  Total Protein 6.5 - 8.1 g/dL 6.5 6.7 6.7  Total Bilirubin 0.3 - 1.2 mg/dL 0.8 - 0.4  Alkaline Phos 38 - 126 U/L 117 - 110  AST 15 - 41 U/L 43(H) - 21  ALT 0 - 44 U/L 21 - 18   11/09/2018 Right Neck Lymph Node Biopsy   09/28/2018 Flow Cytometry    09/28/2018 Lymph Node Biopsy    09/06/2018 LABS: Component     Latest Ref Rng & Units 09/06/2018  IgG (Immunoglobin G), Serum     586 - 1,602 mg/dL 1,254  IgA     87 - 352 mg/dL 377 (H)  IgM (Immunoglobulin M), Srm     26 - 217 mg/dL 12 (L)  Total Protein ELP     6.0 - 8.5 g/dL 7.1  Albumin SerPl Elph-Mcnc     2.9 - 4.4 g/dL 4.1  Alpha 1     0.0 - 0.4 g/dL 0.2  Alpha2 Glob SerPl Elph-Mcnc     0.4 - 1.0 g/dL 0.8  B-Globulin SerPl Elph-Mcnc     0.7 - 1.3 g/dL 0.9  Gamma Glob SerPl Elph-Mcnc     0.4 - 1.8 g/dL 1.1  M Protein SerPl Elph-Mcnc     Not Observed g/dL Not Observed  Globulin, Total     2.2 - 3.9 g/dL 3.0  Albumin/Glob SerPl     0.7 - 1.7 1.4  IFE 1      Comment  Please Note (HCV):      Comment  Kappa free light chain     3.3 - 19.4 mg/L 19.2  Lamda free light chains     5.7 - 26.3 mg/L 21.2  Kappa, lamda light chain ratio     0.26 - 1.65 0.91  Beta-2 Microglobulin     0.6 - 2.4 mg/L 2.4  LDH     98 - 192 U/L 224 (H)  Sed Rate     0 - 22 mm/hr 19  Vitamin D, 25-Hydroxy     30.0 - 100.0 ng/mL 23.5 (L)  Vitamin B12     180 - 914 pg/mL 1,415  (H)  Ferritin  11 - 307 ng/mL 241    RADIOGRAPHIC STUDIES: I have personally reviewed the radiological images as listed and agreed with the findings in the report. No results found.   08/28/2018 Shoulder MRI without contrast:  "1. Multifocal small osseous lesions in the proximal humerus and a single similar lesion in the glenoid, highly suspicious for malignancy, such as multiple myeloma or metastatic disease. 2. Full-thickness, full width teat of the suprespinatus tendon at the greater tuberosity with about 4cm tendon retrac ion. Mild supraspinatus muscle atrophy. 3. Mild diffuse subscapularis and infraspinatus tendinosis. Minimal interstitial tearing of the infraspinatus tendon. Moderate to sever diffuse infraspinatus msucle atrophy. 4. Moderate to severe diffuse atrophy of the teres minor muscle likely related to chronic denervation. No mass in the quadrilateral space. 5. 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 6. Mild lateral downsloping of the acromion and a mild subacromial enthesopathy. 7. Mild glenohumeral athrosis. 8. Degeneration and probable tear of the superior labrum. Associated small intraosseus ganglion and marrow edema in the adjacent glenoid."  ASSESSMENT & PLAN:   1. Bone metastases with unknown primary noted on MRI shoulder 2. Extensive lymphadenopathy 09/17/2018 PET skull base to thigh revealed "1. Widespread hypermetabolic metastatic disease involving lymph nodes throughout the neck, chest, abdomen and pelvis as well as within lungs, liver, spleen, bones and soft tissues. Findings are suspicious for lymphoma. 2. Small right renal stone."  (09/28/2018) Lymph Node Biopsy is consistent with Sarcoidosis.    PLAN: -Discussed pt labwork today, 11/21/18; all values are WNL except for RBC at 3.76, Calcium at 8.7, AST at 43. -Discussed 11/09/18 Acid Fast Smear is "Negative" -Discussed 11/09/18 Fungus Culture result is "no fungus observed" -reviewed her  excisional LN biopsy results consistent with sarcoidosis. -Recommend pt see her PCP as soon as possible  -Will recommend that pt's PCP, [name], refer pt to a Rheumatologist for further evaluation and mx of newly diagnosed sarcoidosis -Will begin pt on 60 mg of Prednisone x 7 days-->28mpodaily for 7 days---> 368mpo daily for 7 days ---> 2044mo daily for 10 days Further taper per rheumatologist/PCP -GI prophylaxis while on steroids -Rx Prednisone -Will see the pt back as needed   FOLLOW UP: RTC with Dr KalIrene Limbo needed   No orders of the defined types were placed in this encounter.   The total time spent in the appt was 25 minutes and more than 50% was on counseling and direct patient cares.  All of the patient's questions were answered with apparent satisfaction. The patient knows to call the clinic with any problems, questions or concerns.   GauSullivan Lone MS [place] LakeHIVMS SCHGlendora Digestive Disease InstituteHQuitman County Hospitalmatology/Oncology Physician ConWinona Health ServicesOffice):       [phone] cell):  [phone]):           [phone]/08/2018 3:19 PM  I, JazYevette Edwardsm acting as a scribe for Dr. GauSullivan Lone .I have reviewed the above documentation for accuracy and completeness, and I agree with the above. .GaBrunetta Genera

## 2018-11-21 ENCOUNTER — Inpatient Hospital Stay: Payer: BC Managed Care – PPO | Attending: Hematology | Admitting: Hematology

## 2018-11-21 ENCOUNTER — Other Ambulatory Visit: Payer: Self-pay

## 2018-11-21 ENCOUNTER — Encounter: Payer: Self-pay | Admitting: Family

## 2018-11-21 VITALS — BP 137/85 | HR 88 | Temp 98.3°F | Resp 18 | Ht 64.5 in | Wt 232.2 lb

## 2018-11-21 DIAGNOSIS — C787 Secondary malignant neoplasm of liver and intrahepatic bile duct: Secondary | ICD-10-CM | POA: Diagnosis not present

## 2018-11-21 DIAGNOSIS — Z9884 Bariatric surgery status: Secondary | ICD-10-CM | POA: Insufficient documentation

## 2018-11-21 DIAGNOSIS — F419 Anxiety disorder, unspecified: Secondary | ICD-10-CM | POA: Diagnosis not present

## 2018-11-21 DIAGNOSIS — K219 Gastro-esophageal reflux disease without esophagitis: Secondary | ICD-10-CM | POA: Diagnosis not present

## 2018-11-21 DIAGNOSIS — N3010 Interstitial cystitis (chronic) without hematuria: Secondary | ICD-10-CM | POA: Diagnosis not present

## 2018-11-21 DIAGNOSIS — Z8249 Family history of ischemic heart disease and other diseases of the circulatory system: Secondary | ICD-10-CM | POA: Insufficient documentation

## 2018-11-21 DIAGNOSIS — Z83438 Family history of other disorder of lipoprotein metabolism and other lipidemia: Secondary | ICD-10-CM | POA: Insufficient documentation

## 2018-11-21 DIAGNOSIS — M25552 Pain in left hip: Secondary | ICD-10-CM | POA: Diagnosis not present

## 2018-11-21 DIAGNOSIS — Z8042 Family history of malignant neoplasm of prostate: Secondary | ICD-10-CM | POA: Insufficient documentation

## 2018-11-21 DIAGNOSIS — Z823 Family history of stroke: Secondary | ICD-10-CM | POA: Insufficient documentation

## 2018-11-21 DIAGNOSIS — Z8043 Family history of malignant neoplasm of testis: Secondary | ICD-10-CM | POA: Insufficient documentation

## 2018-11-21 DIAGNOSIS — Z8261 Family history of arthritis: Secondary | ICD-10-CM | POA: Diagnosis not present

## 2018-11-21 DIAGNOSIS — C9000 Multiple myeloma not having achieved remission: Secondary | ICD-10-CM | POA: Diagnosis not present

## 2018-11-21 DIAGNOSIS — D869 Sarcoidosis, unspecified: Secondary | ICD-10-CM

## 2018-11-21 DIAGNOSIS — Z833 Family history of diabetes mellitus: Secondary | ICD-10-CM | POA: Insufficient documentation

## 2018-11-21 DIAGNOSIS — Z818 Family history of other mental and behavioral disorders: Secondary | ICD-10-CM | POA: Diagnosis not present

## 2018-11-21 DIAGNOSIS — C7951 Secondary malignant neoplasm of bone: Secondary | ICD-10-CM | POA: Insufficient documentation

## 2018-11-21 DIAGNOSIS — M25519 Pain in unspecified shoulder: Secondary | ICD-10-CM | POA: Diagnosis not present

## 2018-11-21 DIAGNOSIS — Z6839 Body mass index (BMI) 39.0-39.9, adult: Secondary | ICD-10-CM | POA: Insufficient documentation

## 2018-11-21 DIAGNOSIS — R591 Generalized enlarged lymph nodes: Secondary | ICD-10-CM | POA: Diagnosis not present

## 2018-11-22 ENCOUNTER — Telehealth: Payer: Self-pay | Admitting: Hematology

## 2018-11-22 NOTE — Telephone Encounter (Signed)
Per 10/7 los RTC with Dr Irene Limbo as needed

## 2018-11-27 MED ORDER — PREDNISONE 20 MG PO TABS: ORAL_TABLET | ORAL | 0 refills | Status: AC

## 2018-11-27 MED ORDER — OMEPRAZOLE 40 MG PO CPDR: DELAYED_RELEASE_CAPSULE | ORAL | 1 refills | Status: DC

## 2018-11-28 DIAGNOSIS — Z111 Encounter for screening for respiratory tuberculosis: Secondary | ICD-10-CM | POA: Diagnosis not present

## 2018-11-28 DIAGNOSIS — D869 Sarcoidosis, unspecified: Secondary | ICD-10-CM | POA: Diagnosis not present

## 2018-11-28 DIAGNOSIS — M8580 Other specified disorders of bone density and structure, unspecified site: Secondary | ICD-10-CM | POA: Diagnosis not present

## 2018-12-05 ENCOUNTER — Ambulatory Visit: Payer: BC Managed Care – PPO | Admitting: Family

## 2018-12-10 LAB — FUNGUS CULTURE WITH STAIN

## 2018-12-10 LAB — FUNGAL ORGANISM REFLEX

## 2018-12-10 LAB — FUNGUS CULTURE RESULT

## 2018-12-11 ENCOUNTER — Encounter: Payer: Self-pay | Admitting: Family

## 2018-12-11 ENCOUNTER — Ambulatory Visit (INDEPENDENT_AMBULATORY_CARE_PROVIDER_SITE_OTHER): Payer: BC Managed Care – PPO | Admitting: Family

## 2018-12-11 ENCOUNTER — Other Ambulatory Visit: Payer: Self-pay

## 2018-12-11 DIAGNOSIS — E559 Vitamin D deficiency, unspecified: Secondary | ICD-10-CM

## 2018-12-11 DIAGNOSIS — I10 Essential (primary) hypertension: Secondary | ICD-10-CM | POA: Diagnosis not present

## 2018-12-11 DIAGNOSIS — D869 Sarcoidosis, unspecified: Secondary | ICD-10-CM

## 2018-12-11 DIAGNOSIS — F411 Generalized anxiety disorder: Secondary | ICD-10-CM | POA: Diagnosis not present

## 2018-12-11 NOTE — Progress Notes (Signed)
Virtual Visit via Video Note  I connected with [name] [name] on 12/11/18 at 10:20 AM EDT by a video enabled telemedicine application and verified that I am speaking with the correct person using two identifiers.  Location: Patient: home Provider: work   I discussed the limitations of evaluation and management by telemedicine and the availability of in person appointments. The patient expressed understanding and agreed to proceed.  History of Present Illness:  Patient is a 63 yr old female who presents today for follow up.  Since her last visit, she has been diagnosed with sarcoidosis. He had a lymph node biopsy initially due to concern about possible lymphoma.  Biopsy revealed sarcoid. She saw Rheumatology- Dr. Leigh Aurora.  She is now on prednisone.    Anxiety- reports that her dad who has dementia is now in hospice with cancer.  She reports that she is taking paxil 40mg . Taking xanax to help with sleep.    Vit D deficiency- currently on 50000 iu once weekly x 12 weeks.   Past Medical History:  Diagnosis Date  . Allergy   . Anemia    takes iron supplement  . Anxiety   . Arthritis   . 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 Elevated alkaline phosphatase level    negative workup (presumed secondary to fatty liver)  . Frequency-urgency syndrome   . GERD (gastroesophageal reflux disease)    none since weight loss surgery  . History of kidney stones    seen on Pet scan  . Interstitial cystitis    Dr Vernie Shanks  . Lymphadenopathy of head and neck 11/09/2018  . Neuromuscular disorder (HCC)    rt arm carpal tunnel syndrome  . Obesity    status post bariatric procedure in [facility] 2005  . Osteoporosis   . Thyroid nodule 7/10   `  . Thyroid nodule   . Varicose vein of leg      Social History   Socioeconomic History  . Marital status: Divorced    Spouse name: Not on file  . Number of children: 1  . Years of education: Not on file  . Highest education level: Not on file   Occupational History  . 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[employer]  . Financial resource strain: Not on file  . Food insecurity    Worry: Not on file    Inability: Not on file  . Transportation needs    Medical: Not on file    Non-medical: Not on file  Tobacco Use  . Smoking status: Never Smoker  . Smokeless tobacco: Never Used  Substance and Sexual Activity  . Alcohol use: Yes    Comment: RARE  . Drug use: No  . Sexual activity: Yes    Comment: 1st intercourse 63 yo-More than 5 partners  Lifestyle  . Physical activity    Days per week: Not on file    Minutes per session: Not on file  . Stress: Not on file  Relationships  . Social Herbalist on phone: Not on file    Gets together: Not on file    Attends religious service: Not on file    Active member of club or organization: Not on file    Attends meetings of clubs or organizations: Not on file    Relationship status: Not on file  . Intimate partner violence    Fear of current or ex partner: Not on file  Emotionally abused: Not on file    Physically abused: Not on file    Forced sexual activity: Not on file  Other Topics Concern  . Not on file  Social History Narrative  . Not on file    Past Surgical History:  Procedure Laterality Date  . ANTERIOR CERVICAL DECOMP/DISCECTOMY FUSION N/A 06/21/2012   Procedure: ANTERIOR CERVICAL DECOMPRESSION/DISCECTOMY FUSION C-4 - C5 (SPACER/DePUY CERVICAL PLATE ONLY) 1 LEVEL;  Surgeon: Melina Schools, MD;  Location: [facility] [place];  Service: Orthopedics;  Laterality: N/A;  . BLADDER SURGERY  02/21/12 and 02/28/12   Dr Diona Fanti  . COLPOSCOPY  1980  . CRYOTHERAPY  1980  . CYSTO WITH HYDRODISTENSION  12/12/2011   Procedure: CYSTOSCOPY/HYDRODISTENSION;  Surgeon: Franchot Gallo, MD;  [facility];  Service: Urology;  Laterality: N/A;  30 MIN   . EYE SURGERY  1997   bilateral cataract extraction  . FOOT SURGERY     bilateral  bunionettes  . FOOT SURGERY Right 01/07/14   Pt states surgery was due to arthritis. "Has pins in place now"  . GASTRIC BYPASS  2005  . JOINT REPLACEMENT  2008   left total knee  . KNEE SURGERY  1997   left knee  . LYMPH NODE DISSECTION Right 11/09/2018   Procedure: EXCISE DEEP RIGHT CERVICAL LYMPH NODE;  Surgeon: Fanny Skates, MD;  Location: [place];  Service: General;  Laterality: Right;  . NASAL SINUS SURGERY  [id number]   Dr Erik Obey  . REFRACTIVE SURGERY  2006   lasik  . SHOULDER SURGERY  2 2012   RIGHT   . STOMACH SURGERY  2007   "tummy tuck"  . TOTAL KNEE ARTHROPLASTY Right 12/31/2015   Procedure: RIGHT TOTAL KNEE ARTHROPLASTY;  Surgeon: [name], MD;  Location: [facility];  Service: Orthopedics;  Laterality: Right;  Adductor Block  . VARICOSE VEIN SURGERY Right 02/2012   leg    Family History  Problem Relation Age of Onset  . Diabetes Paternal Grandfather   . Stroke Paternal Grandfather   . Hyperlipidemia Mother   . Hypertension Mother   . Hypertension Father   . Diabetes Father   . Prostate cancer Father   . Dementia Father   . Heart disease Maternal Grandmother   . Hypertension Maternal Grandmother   . Heart disease Maternal Grandfather   . Hypertension Maternal Grandfather   . Stroke Maternal Grandfather   . Arthritis Brother   . Testicular cancer Brother   . Colon cancer Neg Hx   . Esophageal cancer Neg Hx   . Pancreatic cancer Neg Hx   . Stomach cancer Neg Hx     Allergies  Allergen Reactions  . Nitrofurantoin     REACTION: fever    Current Outpatient Medications on File Prior to Visit  Medication Sig Dispense Refill  . acetaminophen (TYLENOL) 650 MG CR tablet Take 2 tablets (1,300 mg total) by mouth every 8 (eight) hours as needed for pain. Take for mild pain. Do not combine with Percocet. Do not exceed daily recommended limit of Tylenol.    . ALPRAZolam (XANAX) 0.5 MG tablet Take 1 tablet (0.5 mg total) by mouth 2 (two) times daily as needed for  anxiety. 60 tablet 1  . amitriptyline (ELAVIL) 50 MG tablet TAKE 1 TABLET BY MOUTH EVERY EVENING  3  . azelastine (ASTELIN) 0.1 % nasal spray Place 1 spray into both nostrils 2 (two) times daily. Use in each nostril as directed (Patient taking differently: Place 1 spray into  both nostrils 2 (two) times daily as needed for allergies. Use in each nostril as directed) 30 mL 12  . calcium carbonate (OSCAL) 1500 (600 Ca) MG TABS tablet Take 600 mg of elemental calcium by mouth 2 (two) times daily with a meal.    . Cholecalciferol (VITAMIN D-1000 MAX ST) 25 MCG (1000 UT) tablet Take 1 tablet (1,000 Units total) by mouth daily.    . Ferrous Sulfate (IRON) 325 (65 Fe) MG TABS Take 1 tablet (325 mg total) by mouth daily. 30 each 0  . Fexofenadine HCl (ALLEGRA PO) Take 180 mg by mouth daily.     . fluticasone (FLONASE) 50 MCG/ACT nasal spray Place 2 sprays into both nostrils daily. 16 g 6  . Lifitegrast (XIIDRA) 5 % SOLN Place 1 drop into both eyes 2 (two) times daily.     . Multiple Vitamin (MULTIVITAMIN) tablet Take 1 tablet by mouth daily.     . multivitamin-lutein (OCUVITE-LUTEIN) CAPS capsule Take 1 capsule daily by mouth.    Marland Kitchen MYRBETRIQ 50 MG TB24 tablet Take 50 mg by mouth every evening.   2  . omeprazole (PRILOSEC) 40 MG capsule TAKE 1 CAPSULE BY MOUTH EVERY DAY 301 capsule 1  . OVER THE COUNTER MEDICATION Place 1 drop into both eyes 2 (two) times daily. Allergy eye drops    . PARoxetine (PAXIL) 40 MG tablet Take 1 tablet (40 mg total) by mouth every morning. 30 tablet 2  . predniSONE (DELTASONE) 20 MG tablet Take 3 tablets (60 mg total) by mouth daily with breakfast for 7 days, THEN 2 tablets (40 mg total) daily with breakfast for 7 days, THEN 1.5 tablets (30 mg total) daily with breakfast for 7 days, THEN 1 tablet (20 mg total) daily with breakfast for 10 days. 55.5 tablet 0  . valACYclovir (VALTREX) 1000 MG tablet Take 0.5 tablets (500 mg total) by mouth daily. 30 tablet 11  . VITAMIN A PO Take 1  capsule by mouth daily.     . vitamin C (ASCORBIC ACID) 500 MG tablet Take 500 mg by mouth daily.    . Vitamin D, Ergocalciferol, (DRISDOL) 1.25 MG (50000 UT) CAPS capsule Take 1 capsule (50,000 Units total) by mouth every 7 (seven) days. 12 capsule 0  . zinc gluconate 50 MG tablet Take 50 mg by mouth daily.     No current facility-administered medications on file prior to visit.     There were no vitals taken for this visit.   Observations/Objective:   Gen: Awake, alert, no acute distress Resp: Breathing is even and non-labored Psych: calm/pleasant demeanor Neuro: Alert and Oriented x 3, + facial symmetry, speech is clear.   Assessment and Plan:  Vit D deficiency- continues the 50000 iu once weekly x 12 weeks. I advised her to schedule a follow up lab appointment at the end of the 12 week course.  Historically she has been on a twice weekly dose of 60454 iu due to her bariatric surgery hx.   Anxiety- overall stable on paxil. Continue same.  Sarcoidosis- now on a prednisone taper which is being managed by rheumatology.   HTN- bp has been stable. Monitor.  BP Readings from Last 3 Encounters:  11/21/18 137/85  11/09/18 (!) 144/84  10/04/18 133/84     Flu shot is up to date.   Follow Up Instructions:    I discussed the assessment and treatment plan with the patient. The patient was provided an opportunity to ask questions and all were answered.  The patient agreed with the plan and demonstrated an understanding of the instructions.   The patient was advised to call back or seek an in-person evaluation if the symptoms worsen or if the condition fails to improve as anticipated.  Nance Pear, NP

## 2018-12-17 DIAGNOSIS — D869 Sarcoidosis, unspecified: Secondary | ICD-10-CM | POA: Diagnosis not present

## 2018-12-17 DIAGNOSIS — M8580 Other specified disorders of bone density and structure, unspecified site: Secondary | ICD-10-CM | POA: Diagnosis not present

## 2018-12-18 ENCOUNTER — Telehealth: Payer: Self-pay | Admitting: Family

## 2018-12-18 ENCOUNTER — Other Ambulatory Visit: Payer: Self-pay | Admitting: Family

## 2018-12-18 NOTE — Telephone Encounter (Signed)
Talked to patient and advised she needs appointment to come in to the lab for vit d check. She verbalized understanding and she will call back to schedule.

## 2018-12-18 NOTE — Telephone Encounter (Signed)
I saw her request for vitamin D refill. I would like to see her vitamin D level before I decide on dosing. Order is in system. Please book pt a lab visit.

## 2018-12-19 ENCOUNTER — Encounter: Payer: Self-pay | Admitting: Family

## 2018-12-19 ENCOUNTER — Other Ambulatory Visit: Payer: Self-pay | Admitting: Family

## 2018-12-20 MED ORDER — ALPRAZOLAM 0.5 MG PO TABS: 0.5000 mg | ORAL_TABLET | Freq: Two times a day (BID) | ORAL | 1 refills | Status: DC | PRN

## 2018-12-20 NOTE — Telephone Encounter (Signed)
See mychart.  

## 2018-12-24 LAB — ACID FAST CULTURE WITH REFLEXED SENSITIVITIES (MYCOBACTERIA): Acid Fast Culture: NEGATIVE

## 2019-01-15 ENCOUNTER — Other Ambulatory Visit: Payer: Self-pay

## 2019-01-15 ENCOUNTER — Telehealth (INDEPENDENT_AMBULATORY_CARE_PROVIDER_SITE_OTHER): Payer: BC Managed Care – PPO | Admitting: Family

## 2019-01-15 ENCOUNTER — Encounter: Payer: Self-pay | Admitting: Family

## 2019-01-15 DIAGNOSIS — J019 Acute sinusitis, unspecified: Secondary | ICD-10-CM | POA: Diagnosis not present

## 2019-01-15 MED ORDER — AMOXICILLIN-POT CLAVULANATE 875-125 MG PO TABS: 1.0000 | ORAL_TABLET | Freq: Two times a day (BID) | ORAL | 0 refills | Status: DC

## 2019-01-15 MED ORDER — BENZONATATE 100 MG PO CAPS: 100.0000 mg | ORAL_CAPSULE | Freq: Three times a day (TID) | ORAL | 0 refills | Status: DC | PRN

## 2019-01-15 NOTE — Progress Notes (Signed)
Virtual Visit via Video Note  I connected with [name] [name] on 01/15/19 at  5:20 PM EST by a video enabled telemedicine application and verified that I am speaking with the correct person using two identifiers.  Location: Patient: home Provider: work   I discussed the limitations of evaluation and management by telemedicine and the availability of in person appointments. The patient expressed understanding and agreed to proceed.  History of Present Illness:  Patient is a 63 yr old female who presents today to discuss sinus congestion. Reports that symptoms started 1 month ago and have recently worsened.  Reports + sinus congestion with associated post nasal drip, cough and voice hoarseness. She denies SOB. She is on prednisone for her sarcoidosis.    Past Medical History:  Diagnosis Date  . Allergy   . Anemia    takes iron supplement  . Anxiety   . Arthritis   . 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 Elevated alkaline phosphatase level    negative workup (presumed secondary to fatty liver)  . Frequency-urgency syndrome   . GERD (gastroesophageal reflux disease)    none since weight loss surgery  . History of kidney stones    seen on Pet scan  . Interstitial cystitis    Dr Vernie Shanks  . Lymphadenopathy of head and neck 11/09/2018  . Neuromuscular disorder (HCC)    rt arm carpal tunnel syndrome  . Obesity    status post bariatric procedure in [facility] 2005  . Osteoporosis   . Thyroid nodule 7/10   `  . Thyroid nodule   . Varicose vein of leg      Social History   Socioeconomic History  . Marital status: Divorced    Spouse name: Not on file  . Number of children: 1  . Years of education: Not on file  . Highest education level: Not on file  Occupational History  . 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 Financial resource strain: Not on file  . Food insecurity    Worry: Not on file    Inability: Not on file  . Transportation needs     Medical: Not on file    Non-medical: Not on file  Tobacco Use  . Smoking status: Never Smoker  . Smokeless tobacco: Never Used  Substance and Sexual Activity  . Alcohol use: Yes    Comment: RARE  . Drug use: No  . Sexual activity: Yes    Comment: 1st intercourse 63 yo-More than 5 partners  Lifestyle  . Physical activity    Days per week: Not on file    Minutes per session: Not on file  . Stress: Not on file  Relationships  . Social Herbalist on phone: Not on file    Gets together: Not on file    Attends religious service: Not on file    Active member of club or organization: Not on file    Attends meetings of clubs or organizations: Not on file    Relationship status: Not on file  . Intimate partner violence    Fear of current or ex partner: Not on file    Emotionally abused: Not on file    Physically abused: Not on file    Forced sexual activity: Not on file  Other Topics Concern  . Not on file  Social History Narrative  . Not on file    Past Surgical History:  Procedure  Laterality Date  . ANTERIOR CERVICAL DECOMP/DISCECTOMY FUSION N/A 06/21/2012   Procedure: ANTERIOR CERVICAL DECOMPRESSION/DISCECTOMY FUSION C-4 - C5 (SPACER/DePUY CERVICAL PLATE ONLY) 1 LEVEL;  Surgeon: Melina Schools, MD;  Location: [facility];  Service: Orthopedics;  Laterality: N/A;  . BLADDER SURGERY  02/21/12 and 02/28/12   Dr Diona Fanti  . COLPOSCOPY  1980  . CRYOTHERAPY  1980  . CYSTO WITH HYDRODISTENSION  12/12/2011   Procedure: CYSTOSCOPY/HYDRODISTENSION;  Surgeon: Franchot Gallo, MD;  Location: [facility];  Service: Urology;  Laterality: N/A;  30 MIN   . EYE SURGERY  1997   bilateral cataract extraction  . FOOT SURGERY     bilateral bunionettes  . FOOT SURGERY Right 01/07/14   Pt states surgery was due to arthritis. "Has pins in place now"  . GASTRIC BYPASS  2005  . JOINT REPLACEMENT  2008   left total knee  . KNEE SURGERY  1997   left knee  . LYMPH NODE  DISSECTION Right 11/09/2018   Procedure: EXCISE DEEP RIGHT CERVICAL LYMPH NODE;  Surgeon: Fanny Skates, MD;  Location: [place];  Service: General;  Laterality: Right;  . NASAL SINUS SURGERY  [id number]   Dr Erik Obey  . REFRACTIVE SURGERY  2006   lasik  . SHOULDER SURGERY  2 2012   RIGHT   . STOMACH SURGERY  2007   "tummy tuck"  . TOTAL KNEE ARTHROPLASTY Right 12/31/2015   Procedure: RIGHT TOTAL KNEE ARTHROPLASTY;  Surgeon: [name], MD;  Location: [facility];  Service: Orthopedics;  Laterality: Right;  Adductor Block  . VARICOSE VEIN SURGERY Right 02/2012   leg    Family History  Problem Relation Age of Onset  . Diabetes Paternal Grandfather   . Stroke Paternal Grandfather   . Hyperlipidemia Mother   . Hypertension Mother   . Hypertension Father   . Diabetes Father   . Prostate cancer Father   . Dementia Father   . Heart disease Maternal Grandmother   . Hypertension Maternal Grandmother   . Heart disease Maternal Grandfather   . Hypertension Maternal Grandfather   . Stroke Maternal Grandfather   . Arthritis Brother   . Testicular cancer Brother   . Colon cancer Neg Hx   . Esophageal cancer Neg Hx   . Pancreatic cancer Neg Hx   . Stomach cancer Neg Hx     Allergies  Allergen Reactions  . Nitrofurantoin     REACTION: fever    Current Outpatient Medications on File Prior to Visit  Medication Sig Dispense Refill  . acetaminophen (TYLENOL) 650 MG CR tablet Take 2 tablets (1,300 mg total) by mouth every 8 (eight) hours as needed for pain. Take for mild pain. Do not combine with Percocet. Do not exceed daily recommended limit of Tylenol.    . ALPRAZolam (XANAX) 0.5 MG tablet Take 1 tablet (0.5 mg total) by mouth 2 (two) times daily as needed for anxiety. 60 tablet 1  . amitriptyline (ELAVIL) 50 MG tablet TAKE 1 TABLET BY MOUTH EVERY EVENING  3  . azelastine (ASTELIN) 0.1 % nasal spray Place 1 spray into both nostrils 2 (two) times daily. Use in each nostril as directed (Patient  taking differently: Place 1 spray into both nostrils 2 (two) times daily as needed for allergies. Use in each nostril as directed) 30 mL 12  . calcium carbonate (OSCAL) 1500 (600 Ca) MG TABS tablet Take 600 mg of elemental calcium by mouth 2 (two) times daily with a meal.    .  Cholecalciferol (VITAMIN D-1000 MAX ST) 25 MCG (1000 UT) tablet Take 1 tablet (1,000 Units total) by mouth daily.    . Ferrous Sulfate (IRON) 325 (65 Fe) MG TABS Take 1 tablet (325 mg total) by mouth daily. 30 each 0  . Fexofenadine HCl (ALLEGRA PO) Take 180 mg by mouth daily.     . fluticasone (FLONASE) 50 MCG/ACT nasal spray Place 2 sprays into both nostrils daily. 16 g 6  . Lifitegrast (XIIDRA) 5 % SOLN Place 1 drop into both eyes 2 (two) times daily.     . Multiple Vitamin (MULTIVITAMIN) tablet Take 1 tablet by mouth daily.     . multivitamin-lutein (OCUVITE-LUTEIN) CAPS capsule Take 1 capsule daily by mouth.    Marland Kitchen MYRBETRIQ 50 MG TB24 tablet Take 50 mg by mouth every evening.   2  . omeprazole (PRILOSEC) 40 MG capsule TAKE 1 CAPSULE BY MOUTH EVERY DAY 301 capsule 1  . OVER THE COUNTER MEDICATION Place 1 drop into both eyes 2 (two) times daily. Allergy eye drops    . PARoxetine (PAXIL) 40 MG tablet TAKE 1 TABLET BY MOUTH EVERY DAY IN THE MORNING 90 tablet 1  . valACYclovir (VALTREX) 1000 MG tablet Take 0.5 tablets (500 mg total) by mouth daily. 30 tablet 11  . VITAMIN A PO Take 1 capsule by mouth daily.     . vitamin C (ASCORBIC ACID) 500 MG tablet Take 500 mg by mouth daily.    . Vitamin D, Ergocalciferol, (DRISDOL) 1.25 MG (50000 UT) CAPS capsule Take 1 capsule (50,000 Units total) by mouth every 7 (seven) days. 12 capsule 0  . zinc gluconate 50 MG tablet Take 50 mg by mouth daily.     No current facility-administered medications on file prior to visit.     There were no vitals taken for this visit.   Observations/Objective:   Gen: Awake, alert, no acute distress Resp: Breathing is even and non-labored Psych:  calm/pleasant demeanor Neuro: Alert and Oriented x 3, + facial symmetry, speech is clear.   Assessment and Plan:  Sinusitis- will rx with augmentin. Pt given rx for tessalon prn cough.  Pt is advised to call if new/worsening symptoms or if symptoms are not improved in 2-3 days.   Follow Up Instructions:    I discussed the assessment and treatment plan with the patient. The patient was provided an opportunity to ask questions and all were answered. The patient agreed with the plan and demonstrated an understanding of the instructions.   The patient was advised to call back or seek an in-person evaluation if the symptoms worsen or if the condition fails to improve as anticipated.  Nance Pear, NP

## 2019-01-16 ENCOUNTER — Telehealth: Payer: BC Managed Care – PPO | Admitting: Family

## 2019-01-23 ENCOUNTER — Other Ambulatory Visit: Payer: Self-pay

## 2019-01-23 DIAGNOSIS — Z20822 Contact with and (suspected) exposure to covid-19: Secondary | ICD-10-CM

## 2019-01-24 LAB — NOVEL CORONAVIRUS, NAA: SARS-CoV-2, NAA: NOT DETECTED

## 2019-02-21 ENCOUNTER — Other Ambulatory Visit: Payer: Self-pay | Admitting: Family

## 2019-02-27 DIAGNOSIS — M8580 Other specified disorders of bone density and structure, unspecified site: Secondary | ICD-10-CM | POA: Diagnosis not present

## 2019-02-27 DIAGNOSIS — D869 Sarcoidosis, unspecified: Secondary | ICD-10-CM | POA: Diagnosis not present

## 2019-02-28 ENCOUNTER — Other Ambulatory Visit: Payer: Self-pay | Admitting: Internal Medicine

## 2019-02-28 ENCOUNTER — Telehealth: Payer: Self-pay | Admitting: Family

## 2019-02-28 ENCOUNTER — Other Ambulatory Visit: Payer: Self-pay

## 2019-02-28 ENCOUNTER — Other Ambulatory Visit (HOSPITAL_COMMUNITY): Payer: Self-pay | Admitting: Internal Medicine

## 2019-02-28 DIAGNOSIS — D869 Sarcoidosis, unspecified: Secondary | ICD-10-CM

## 2019-02-28 NOTE — Telephone Encounter (Signed)
1/14 11:46am [name] [name] No appts available today. Please advise how to proceed.   Copied from [place] [id number]. Topic: Appointment Scheduling - Scheduling Inquiry for Clinic >> Feb 28, 2019 10:17 AM [name] wrote: Reason for CRM:pt called and stated that she would like an appointment today if possible. Pt states that she may have fluid on her ear and is dizzy. This is not a new problem. Pt would like a call back as soon as possible. Please advise

## 2019-02-28 NOTE — Telephone Encounter (Signed)
She can schedule at another office, urgent care, or ED if want appointment today.  Can schedule for tomorrow since she has had this issue before.

## 2019-03-01 ENCOUNTER — Encounter (HOSPITAL_BASED_OUTPATIENT_CLINIC_OR_DEPARTMENT_OTHER): Payer: Self-pay

## 2019-03-01 ENCOUNTER — Ambulatory Visit: Payer: BC Managed Care – PPO | Admitting: Family

## 2019-03-01 ENCOUNTER — Other Ambulatory Visit: Payer: Self-pay

## 2019-03-01 ENCOUNTER — Encounter: Payer: Self-pay | Admitting: Family

## 2019-03-01 ENCOUNTER — Ambulatory Visit (HOSPITAL_BASED_OUTPATIENT_CLINIC_OR_DEPARTMENT_OTHER)
Admission: RE | Admit: 2019-03-01 | Discharge: 2019-03-01 | Disposition: A | Discharge status: Home / Self Care | Payer: BC Managed Care – PPO | Source: Ambulatory Visit | Attending: Family | Admitting: Family

## 2019-03-01 VITALS — BP 119/76 | HR 95 | Temp 96.5°F | Resp 16 | Ht 64.0 in | Wt 242.0 lb

## 2019-03-01 DIAGNOSIS — S0990XA Unspecified injury of head, initial encounter: Secondary | ICD-10-CM | POA: Diagnosis not present

## 2019-03-01 DIAGNOSIS — S0510XA Contusion of eyeball and orbital tissues, unspecified eye, initial encounter: Secondary | ICD-10-CM | POA: Diagnosis not present

## 2019-03-01 DIAGNOSIS — G44319 Acute post-traumatic headache, not intractable: Secondary | ICD-10-CM

## 2019-03-01 DIAGNOSIS — D869 Sarcoidosis, unspecified: Secondary | ICD-10-CM

## 2019-03-01 DIAGNOSIS — E559 Vitamin D deficiency, unspecified: Secondary | ICD-10-CM

## 2019-03-01 DIAGNOSIS — J329 Chronic sinusitis, unspecified: Secondary | ICD-10-CM

## 2019-03-01 DIAGNOSIS — S0512XA Contusion of eyeball and orbital tissues, left eye, initial encounter: Secondary | ICD-10-CM | POA: Diagnosis not present

## 2019-03-01 MED ORDER — AMOXICILLIN-POT CLAVULANATE 875-125 MG PO TABS: 1.0000 | ORAL_TABLET | Freq: Two times a day (BID) | ORAL | 0 refills | Status: DC

## 2019-03-01 MED ORDER — FLUCONAZOLE 150 MG PO TABS: 150.0000 mg | ORAL_TABLET | Freq: Once | ORAL | 0 refills | Status: AC

## 2019-03-01 NOTE — Progress Notes (Signed)
Subjective:    Patient ID: [name] [name], female    DOB: 1955/08/27, 64 y.o.   MRN: [id number]  HPI  Patient is a 64 yr old female who presents today following a fall on 02/25/19. Reports that she was walking out of the dental office.  Reports that she lost her balance and fell.  Reports that her left forehead hit the stone facade in front of the desk.  She then hit her left cheek. Reports that her pain has been controlled with advil.  She has been icing. Reports that she bend forward to pick up her granddaughter and stood back up and became dizzy. Reports some mild "movement".  She does have a headache.  Rates HA 5/10.  She denies nausea/vomitting.    Review of Systems See HPI  Past Medical History:  Diagnosis Date  . Allergy   . Anemia    takes iron supplement  . Anxiety   . Arthritis   . 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 Elevated alkaline phosphatase level    negative workup (presumed secondary to fatty liver)  . Frequency-urgency syndrome   . GERD (gastroesophageal reflux disease)    none since weight loss surgery  . History of kidney stones    seen on Pet scan  . Interstitial cystitis    Dr Vernie Shanks  . Lymphadenopathy of head and neck 11/09/2018  . Neuromuscular disorder (HCC)    rt arm carpal tunnel syndrome  . Obesity    status post bariatric procedure in [facility] 2005  . Osteoporosis   . Thyroid nodule 7/10   `  . Thyroid nodule   . Varicose vein of leg      Social History   Socioeconomic History  . Marital status: Divorced    Spouse name: Not on file  . Number of children: 1  . Years of education: Not on file  . Highest education level: Not on file  Occupational History  . 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[employer]  Tobacco Use  . Smoking status: Never Smoker  . Smokeless tobacco: Never Used  Substance and Sexual Activity  . Alcohol use: Yes    Comment: RARE  . Drug use: No  . Sexual activity: Yes    Comment: 1st intercourse 64  yo-More than 5 partners  Other Topics Concern  . Not on file  Social History Narrative  . Not on file   Social Determinants of Health   Financial Resource Strain:   . Difficulty of Paying Living Expenses: Not on file  Food Insecurity:   . Worried About Charity fundraiser in the Last Year: Not on file  . Ran Out of Food in the Last Year: Not on file  Transportation Needs:   . Lack of Transportation (Medical): Not on file  . Lack of Transportation (Non-Medical): Not on file  Physical Activity:   . Days of Exercise per Week: Not on file  . Minutes of Exercise per Session: Not on file  Stress:   . Feeling of Stress : Not on file  Social Connections:   . Frequency of Communication with Friends and Family: Not on file  . Frequency of Social Gatherings with Friends and Family: Not on file  . Attends Religious Services: Not on file  . Active Member of Clubs or Organizations: Not on file  . Attends Archivist Meetings: Not on file  . Marital Status: Not on file  Intimate Partner Violence:   .  Fear of Current or Ex-Partner: Not on file  . Emotionally Abused: Not on file  . Physically Abused: Not on file  . Sexually Abused: Not on file    Past Surgical History:  Procedure Laterality Date  . ANTERIOR CERVICAL DECOMP/DISCECTOMY FUSION N/A 06/21/2012   Procedure: ANTERIOR CERVICAL DECOMPRESSION/DISCECTOMY FUSION C-4 - C5 (SPACER/DePUY CERVICAL PLATE ONLY) 1 LEVEL;  Surgeon: Melina Schools, MD;  Location: [facility];  Service: Orthopedics;  Laterality: N/A;  . BLADDER SURGERY  02/21/12 and 02/28/12   Dr Diona Fanti  . COLPOSCOPY  1980  . CRYOTHERAPY  1980  . CYSTO WITH HYDRODISTENSION  12/12/2011   Procedure: CYSTOSCOPY/HYDRODISTENSION;  Surgeon: Franchot Gallo, MD;  Location: [facility];  Service: Urology;  Laterality: N/A;  30 MIN   . EYE SURGERY  1997   bilateral cataract extraction  . FOOT SURGERY     bilateral bunionettes  . FOOT SURGERY Right 01/07/14   Pt  states surgery was due to arthritis. "Has pins in place now"  . GASTRIC BYPASS  2005  . JOINT REPLACEMENT  2008   left total knee  . KNEE SURGERY  1997   left knee  . LYMPH NODE DISSECTION Right 11/09/2018   Procedure: EXCISE DEEP RIGHT CERVICAL LYMPH NODE;  Surgeon: Fanny Skates, MD;  Location: [place];  Service: General;  Laterality: Right;  . NASAL SINUS SURGERY  [id number]   Dr Erik Obey  . REFRACTIVE SURGERY  2006   lasik  . SHOULDER SURGERY  2 2012   RIGHT   . STOMACH SURGERY  2007   "tummy tuck"  . TOTAL KNEE ARTHROPLASTY Right 12/31/2015   Procedure: RIGHT TOTAL KNEE ARTHROPLASTY;  Surgeon: [name], MD;  Location: [facility];  Service: Orthopedics;  Laterality: Right;  Adductor Block  . VARICOSE VEIN SURGERY Right 02/2012   leg    Family History  Problem Relation Age of Onset  . Diabetes Paternal Grandfather   . Stroke Paternal Grandfather   . Hyperlipidemia Mother   . Hypertension Mother   . Hypertension Father   . Diabetes Father   . Prostate cancer Father   . Dementia Father   . Lung cancer Father   . Heart disease Maternal Grandmother   . Hypertension Maternal Grandmother   . Heart disease Maternal Grandfather   . Hypertension Maternal Grandfather   . Stroke Maternal Grandfather   . Arthritis Brother   . Testicular cancer Brother   . Colon cancer Neg Hx   . Esophageal cancer Neg Hx   . Pancreatic cancer Neg Hx   . Stomach cancer Neg Hx     Allergies  Allergen Reactions  . Nitrofurantoin     REACTION: fever    Current Outpatient Medications on File Prior to Visit  Medication Sig Dispense Refill  . acetaminophen (TYLENOL) 650 MG CR tablet Take 2 tablets (1,300 mg total) by mouth every 8 (eight) hours as needed for pain. Take for mild pain. Do not combine with Percocet. Do not exceed daily recommended limit of Tylenol.    . ALPRAZolam (XANAX) 0.5 MG tablet TAKE 1 TABLET (0.5 MG TOTAL) BY MOUTH 2 (TWO) TIMES DAILY AS NEEDED FOR ANXIETY. 60 tablet 1  .  amitriptyline (ELAVIL) 50 MG tablet TAKE 1 TABLET BY MOUTH EVERY EVENING  3  . azelastine (ASTELIN) 0.1 % nasal spray Place 1 spray into both nostrils 2 (two) times daily. Use in each nostril as directed (Patient taking differently: Place 1 spray into both nostrils 2 (two) times  daily as needed for allergies. Use in each nostril as directed) 30 mL 12  . calcium carbonate (OSCAL) 1500 (600 Ca) MG TABS tablet Take 600 mg of elemental calcium by mouth 2 (two) times daily with a meal.    . Cholecalciferol (VITAMIN D-1000 MAX ST) 25 MCG (1000 UT) tablet Take 1 tablet (1,000 Units total) by mouth daily.    . Ferrous Sulfate (IRON) 325 (65 Fe) MG TABS Take 1 tablet (325 mg total) by mouth daily. 30 each 0  . Fexofenadine HCl (ALLEGRA PO) Take 180 mg by mouth daily.     . fluticasone (FLONASE) 50 MCG/ACT nasal spray Place 2 sprays into both nostrils daily. 16 g 6  . Lifitegrast (XIIDRA) 5 % SOLN Place 1 drop into both eyes 2 (two) times daily.     . Multiple Vitamin (MULTIVITAMIN) tablet Take 1 tablet by mouth daily.     . multivitamin-lutein (OCUVITE-LUTEIN) CAPS capsule Take 1 capsule daily by mouth.    Marland Kitchen MYRBETRIQ 50 MG TB24 tablet Take 50 mg by mouth every evening.   2  . omeprazole (PRILOSEC) 40 MG capsule TAKE 1 CAPSULE BY MOUTH EVERY DAY 301 capsule 1  . OVER THE COUNTER MEDICATION Place 1 drop into both eyes 2 (two) times daily. Allergy eye drops    . PARoxetine (PAXIL) 40 MG tablet TAKE 1 TABLET BY MOUTH EVERY DAY IN THE MORNING 90 tablet 1  . valACYclovir (VALTREX) 1000 MG tablet Take 0.5 tablets (500 mg total) by mouth daily. 30 tablet 11  . VITAMIN A PO Take 1 capsule by mouth daily.     . vitamin C (ASCORBIC ACID) 500 MG tablet Take 500 mg by mouth daily.    . Vitamin D, Ergocalciferol, (DRISDOL) 1.25 MG (50000 UT) CAPS capsule Take 1 capsule (50,000 Units total) by mouth every 7 (seven) days. 12 capsule 0  . zinc gluconate 50 MG tablet Take 50 mg by mouth daily.     No current  facility-administered medications on file prior to visit.    BP 119/76 (BP Location: Right Arm, Patient Position: Sitting, Cuff Size: Large)   Pulse 95   Temp (!) 96.5 F (35.8 C) (Temporal)   Resp 16   Ht 5\' 4"  (1.626 m)   Wt 242 lb (109.8 kg)   SpO2 99%   BMI 41.54 kg/m       Objective:   Physical Exam Constitutional:      Appearance: She is well-developed.  HENT:     Head:     Comments: + swelling and ecchymosis noted left forehead  + swelling and ecchymosis noted left lower orbit.  Neck:     Thyroid: No thyromegaly.  Cardiovascular:     Rate and Rhythm: Normal rate and regular rhythm.     Heart sounds: Normal heart sounds. No murmur.  Pulmonary:     Effort: Pulmonary effort is normal. No respiratory distress.     Breath sounds: Normal breath sounds. No wheezing.  Musculoskeletal:     Cervical back: Neck supple.  Skin:    General: Skin is warm and dry.  Neurological:     Mental Status: She is alert and oriented to person, place, and time.  Psychiatric:        Behavior: Behavior normal.        Thought Content: Thought content normal.        Judgment: Judgment normal.           Assessment & Plan:  Orbital trauma/head  trauma- will obtain an orbital x-ray to rule out fracture.  I have also ordered a CT of the head.  I suspect she suffered a concussion which is likely cause for her headache.  She has no neurological deficits today on exam.  We attempted to get her CT of her head certified at 4 PM today with [facility] and the scheduler was on hold until 5:00 without ability to get in touch with anyone to precertify the CT.  In addition she was unable to enter the request on the portal.  Since she has no acute neurological changes think is reasonable for [place] to wait until Monday for her to get her CT rather than sending her to the ED and risk exposing her to Covid.  However she is advised if her symptoms worsen she should go to the ED over the  weekend.  Sinusitis-we will prescribe Augmentin.  I also gave her a tablet of Diflucan to use as needed for potential yeast infection.  Sarcoidosis-she brought a list of lab work that her rheumatologist, Dr. Amil Amen, is requesting.  I have placed these orders.  Vitamin D deficiency-due for follow-up vitamin D level will obtain.  This visit occurred during the SARS-CoV-2 public health emergency.  Safety protocols were in place, including screening questions prior to the visit, additional usage of staff PPE, and extensive cleaning of exam room while observing appropriate contact time as indicated for disinfecting solutions.

## 2019-03-01 NOTE — Patient Instructions (Signed)
Please complete lab work prior to leaving.  Complete x-ray and CT on the first floor. Start augmentin for sinus infection.

## 2019-03-02 LAB — COMPREHENSIVE METABOLIC PANEL
AG Ratio: 1.5 (calc) (ref 1.0–2.5)
ALT: 24 U/L (ref 6–29)
AST: 17 U/L (ref 10–35)
Albumin: 3.9 g/dL (ref 3.6–5.1)
Alkaline phosphatase (APISO): 99 U/L (ref 37–153)
BUN: 20 mg/dL (ref 7–25)
CO2: 29 mmol/L (ref 20–32)
Calcium: 9.2 mg/dL (ref 8.6–10.4)
Chloride: 106 mmol/L (ref 98–110)
Creat: 0.52 mg/dL (ref 0.50–0.99)
Globulin: 2.6 g/dL (calc) (ref 1.9–3.7)
Glucose, Bld: 87 mg/dL (ref 65–99)
Potassium: 4.2 mmol/L (ref 3.5–5.3)
Sodium: 142 mmol/L (ref 135–146)
Total Bilirubin: 0.4 mg/dL (ref 0.2–1.2)
Total Protein: 6.5 g/dL (ref 6.1–8.1)

## 2019-03-02 LAB — CBC WITH DIFFERENTIAL/PLATELET
Absolute Monocytes: 600 cells/uL (ref 200–950)
Basophils Absolute: 44 cells/uL (ref 0–200)
Basophils Relative: 0.5 %
Eosinophils Absolute: 52 cells/uL (ref 15–500)
Eosinophils Relative: 0.6 %
HCT: 39.2 % (ref 35.0–45.0)
Hemoglobin: 13 g/dL (ref 11.7–15.5)
Lymphs Abs: 1470 cells/uL (ref 850–3900)
MCH: 29.3 pg (ref 27.0–33.0)
MCHC: 33.2 g/dL (ref 32.0–36.0)
MCV: 88.3 fL (ref 80.0–100.0)
MPV: 11.4 fL (ref 7.5–12.5)
Monocytes Relative: 6.9 %
Neutro Abs: 6534 cells/uL (ref 1500–7800)
Neutrophils Relative %: 75.1 %
Platelets: 299 10*3/uL (ref 140–400)
RBC: 4.44 10*6/uL (ref 3.80–5.10)
RDW: 13.9 % (ref 11.0–15.0)
Total Lymphocyte: 16.9 %
WBC: 8.7 10*3/uL (ref 3.8–10.8)

## 2019-03-02 LAB — VITAMIN D 25 HYDROXY (VIT D DEFICIENCY, FRACTURES): Vit D, 25-Hydroxy: 24 ng/mL — ABNORMAL LOW (ref 30–100)

## 2019-03-02 LAB — C-REACTIVE PROTEIN: CRP: 7.4 mg/L (ref <8.0)

## 2019-03-04 ENCOUNTER — Other Ambulatory Visit: Payer: Self-pay

## 2019-03-04 ENCOUNTER — Telehealth: Payer: Self-pay | Admitting: Family

## 2019-03-04 ENCOUNTER — Ambulatory Visit (HOSPITAL_BASED_OUTPATIENT_CLINIC_OR_DEPARTMENT_OTHER)
Admission: RE | Admit: 2019-03-04 | Discharge: 2019-03-04 | Disposition: A | Discharge status: Home / Self Care | Payer: BC Managed Care – PPO | Source: Ambulatory Visit | Attending: Family | Admitting: Family

## 2019-03-04 DIAGNOSIS — S0990XA Unspecified injury of head, initial encounter: Secondary | ICD-10-CM | POA: Diagnosis not present

## 2019-03-04 DIAGNOSIS — E559 Vitamin D deficiency, unspecified: Secondary | ICD-10-CM

## 2019-03-04 DIAGNOSIS — G44319 Acute post-traumatic headache, not intractable: Secondary | ICD-10-CM | POA: Diagnosis not present

## 2019-03-04 MED ORDER — VITAMIN D3 75 MCG (3000 UT) PO TABS: 1.0000 | ORAL_TABLET | Freq: Every day | ORAL | Status: DC

## 2019-03-04 NOTE — Telephone Encounter (Signed)
Please advise pt that vit D is still low. I would like her to change to vit D 3000 iu once daily available otc. Repeat VitD  Level in 3 months, dx vit D deficiency.  There is no obvious fracture of the bone around her eye. Could you please check with Meredith Mody to make sure that they get her CT head precerted and scheduled for today?

## 2019-03-04 NOTE — Telephone Encounter (Signed)
Advised patient of results of vitamin d levels and xray also medication increased. She is scheduled for ct today at 1 pm

## 2019-03-05 ENCOUNTER — Encounter: Payer: Self-pay | Admitting: Family

## 2019-03-08 ENCOUNTER — Encounter (HOSPITAL_COMMUNITY): Payer: Self-pay

## 2019-03-08 ENCOUNTER — Encounter (HOSPITAL_COMMUNITY): Payer: BC Managed Care – PPO

## 2019-03-12 ENCOUNTER — Encounter: Payer: Self-pay | Admitting: Family

## 2019-03-12 MED ORDER — AMOXICILLIN-POT CLAVULANATE 875-125 MG PO TABS: 1.0000 | ORAL_TABLET | Freq: Two times a day (BID) | ORAL | 0 refills | Status: DC

## 2019-03-19 ENCOUNTER — Encounter: Payer: Self-pay | Admitting: Family

## 2019-03-19 DIAGNOSIS — J329 Chronic sinusitis, unspecified: Secondary | ICD-10-CM

## 2019-03-20 ENCOUNTER — Encounter (HOSPITAL_COMMUNITY): Payer: BC Managed Care – PPO

## 2019-04-02 ENCOUNTER — Other Ambulatory Visit: Payer: Self-pay | Admitting: Internal Medicine

## 2019-04-03 ENCOUNTER — Encounter (HOSPITAL_COMMUNITY): Payer: BC Managed Care – PPO

## 2019-04-24 ENCOUNTER — Telehealth: Payer: Self-pay | Admitting: Family

## 2019-04-25 ENCOUNTER — Other Ambulatory Visit: Payer: Self-pay | Admitting: Internal Medicine

## 2019-04-25 DIAGNOSIS — D869 Sarcoidosis, unspecified: Secondary | ICD-10-CM | POA: Diagnosis not present

## 2019-04-25 DIAGNOSIS — M8580 Other specified disorders of bone density and structure, unspecified site: Secondary | ICD-10-CM | POA: Diagnosis not present

## 2019-04-25 NOTE — Telephone Encounter (Signed)
rx was sent

## 2019-04-25 NOTE — Telephone Encounter (Signed)
Last alprazolam RX:  02/22/19, #60 x 1 refill Last OV:  03/01/19 Next OV:  None scheduled UDS:  09/08/17,  Past due CSC:  09/08/17.  Past due

## 2019-05-02 ENCOUNTER — Other Ambulatory Visit: Payer: BC Managed Care – PPO

## 2019-05-06 DIAGNOSIS — D869 Sarcoidosis, unspecified: Secondary | ICD-10-CM | POA: Diagnosis not present

## 2019-05-06 DIAGNOSIS — M25512 Pain in left shoulder: Secondary | ICD-10-CM | POA: Diagnosis not present

## 2019-05-06 DIAGNOSIS — M25511 Pain in right shoulder: Secondary | ICD-10-CM | POA: Diagnosis not present

## 2019-05-06 DIAGNOSIS — M25812 Other specified joint disorders, left shoulder: Secondary | ICD-10-CM | POA: Diagnosis not present

## 2019-05-08 ENCOUNTER — Other Ambulatory Visit: Payer: Self-pay | Admitting: Internal Medicine

## 2019-05-10 ENCOUNTER — Ambulatory Visit
Admission: RE | Admit: 2019-05-10 | Discharge: 2019-05-10 | Disposition: A | Discharge status: Home / Self Care | Payer: BC Managed Care – PPO | Source: Ambulatory Visit | Attending: Internal Medicine | Admitting: Internal Medicine

## 2019-05-10 ENCOUNTER — Other Ambulatory Visit: Payer: Self-pay

## 2019-05-10 DIAGNOSIS — D869 Sarcoidosis, unspecified: Secondary | ICD-10-CM | POA: Diagnosis not present

## 2019-05-10 MED ORDER — IOPAMIDOL (ISOVUE-300) INJECTION 61%
125.0000 mL | Freq: Once | INTRAVENOUS | Status: AC | PRN
  Administered 2019-05-10: 09:00:00 125 mL via INTRAVENOUS

## 2019-05-20 ENCOUNTER — Encounter: Payer: Self-pay | Admitting: Family

## 2019-05-30 DIAGNOSIS — M19071 Primary osteoarthritis, right ankle and foot: Secondary | ICD-10-CM | POA: Diagnosis not present

## 2019-05-30 DIAGNOSIS — M79671 Pain in right foot: Secondary | ICD-10-CM | POA: Diagnosis not present

## 2019-06-18 ENCOUNTER — Other Ambulatory Visit: Payer: Self-pay | Admitting: Family

## 2019-06-25 DIAGNOSIS — M2550 Pain in unspecified joint: Secondary | ICD-10-CM | POA: Diagnosis not present

## 2019-06-25 DIAGNOSIS — D869 Sarcoidosis, unspecified: Secondary | ICD-10-CM | POA: Diagnosis not present

## 2019-06-25 DIAGNOSIS — M8580 Other specified disorders of bone density and structure, unspecified site: Secondary | ICD-10-CM | POA: Diagnosis not present

## 2019-06-26 ENCOUNTER — Encounter: Payer: Self-pay | Admitting: Family

## 2019-06-27 NOTE — Telephone Encounter (Signed)
Patient reports vaginal burning w/o discharge with a history of yeast infections and getting this medication before.

## 2019-06-27 NOTE — Telephone Encounter (Signed)
Last RX:04-25-19 #60 with 1 refill Last OV:03-01-2019 Next WE:[id number] scheduled UDS:09-08-17 CSC:09-08-17 CSR: please review

## 2019-06-27 NOTE — Telephone Encounter (Signed)
Lvm for patient to call back and provide more information and symptoms for her Yeast infection.  Also to let her know provider may request UDS and contract for xanax refill (last done 09-08-17)

## 2019-06-27 NOTE — Telephone Encounter (Signed)
Lvm for patient to call back and provide symptoms for yeast infection, and to let her know the provider may also request a UDS ans contract (last done 09-08-2017)

## 2019-06-28 MED ORDER — FLUCONAZOLE 150 MG PO TABS: ORAL_TABLET | ORAL | 0 refills | Status: DC

## 2019-06-28 MED ORDER — ALPRAZOLAM 0.5 MG PO TABS: 0.5000 mg | ORAL_TABLET | Freq: Two times a day (BID) | ORAL | 1 refills | Status: DC | PRN

## 2019-07-02 DIAGNOSIS — M25512 Pain in left shoulder: Secondary | ICD-10-CM | POA: Diagnosis not present

## 2019-07-12 DIAGNOSIS — M25512 Pain in left shoulder: Secondary | ICD-10-CM | POA: Diagnosis not present

## 2019-07-31 DIAGNOSIS — M25512 Pain in left shoulder: Secondary | ICD-10-CM | POA: Diagnosis not present

## 2019-07-31 DIAGNOSIS — S46012A Strain of muscle(s) and tendon(s) of the rotator cuff of left shoulder, initial encounter: Secondary | ICD-10-CM | POA: Diagnosis not present

## 2019-08-01 ENCOUNTER — Other Ambulatory Visit (HOSPITAL_BASED_OUTPATIENT_CLINIC_OR_DEPARTMENT_OTHER): Payer: Self-pay | Admitting: Family

## 2019-08-01 DIAGNOSIS — Z1231 Encounter for screening mammogram for malignant neoplasm of breast: Secondary | ICD-10-CM

## 2019-08-02 ENCOUNTER — Other Ambulatory Visit: Payer: Self-pay

## 2019-08-02 ENCOUNTER — Ambulatory Visit (HOSPITAL_BASED_OUTPATIENT_CLINIC_OR_DEPARTMENT_OTHER)
Admission: RE | Admit: 2019-08-02 | Discharge: 2019-08-02 | Disposition: A | Discharge status: Home / Self Care | Payer: BC Managed Care – PPO | Source: Ambulatory Visit | Attending: Family | Admitting: Family

## 2019-08-02 DIAGNOSIS — Z1231 Encounter for screening mammogram for malignant neoplasm of breast: Secondary | ICD-10-CM

## 2019-08-12 ENCOUNTER — Ambulatory Visit (HOSPITAL_BASED_OUTPATIENT_CLINIC_OR_DEPARTMENT_OTHER)
Admission: RE | Admit: 2019-08-12 | Discharge: 2019-08-12 | Disposition: A | Discharge status: Home / Self Care | Payer: BC Managed Care – PPO | Source: Ambulatory Visit | Attending: Family | Admitting: Family

## 2019-08-12 ENCOUNTER — Other Ambulatory Visit: Payer: Self-pay

## 2019-08-12 ENCOUNTER — Ambulatory Visit: Payer: BC Managed Care – PPO | Admitting: Family

## 2019-08-12 ENCOUNTER — Encounter: Payer: Self-pay | Admitting: Family

## 2019-08-12 VITALS — BP 130/70 | HR 100 | Temp 97.5°F | Resp 16 | Ht 64.0 in | Wt 258.0 lb

## 2019-08-12 DIAGNOSIS — M79671 Pain in right foot: Secondary | ICD-10-CM | POA: Diagnosis not present

## 2019-08-12 DIAGNOSIS — Z01818 Encounter for other preprocedural examination: Secondary | ICD-10-CM | POA: Diagnosis not present

## 2019-08-12 DIAGNOSIS — J90 Pleural effusion, not elsewhere classified: Secondary | ICD-10-CM | POA: Diagnosis not present

## 2019-08-12 DIAGNOSIS — R600 Localized edema: Secondary | ICD-10-CM | POA: Diagnosis not present

## 2019-08-12 DIAGNOSIS — M79672 Pain in left foot: Secondary | ICD-10-CM | POA: Diagnosis not present

## 2019-08-12 DIAGNOSIS — D869 Sarcoidosis, unspecified: Secondary | ICD-10-CM | POA: Diagnosis not present

## 2019-08-12 LAB — COMPREHENSIVE METABOLIC PANEL
ALT: 16 U/L (ref 0–35)
AST: 19 U/L (ref 0–37)
Albumin: 4 g/dL (ref 3.5–5.2)
Alkaline Phosphatase: 116 U/L (ref 39–117)
BUN: 13 mg/dL (ref 6–23)
CO2: 29 mEq/L (ref 19–32)
Calcium: 9 mg/dL (ref 8.4–10.5)
Chloride: 106 mEq/L (ref 96–112)
Creatinine, Ser: 0.51 mg/dL (ref 0.40–1.20)
GFR: 121.68 mL/min (ref >60.00)
Glucose, Bld: 87 mg/dL (ref 70–99)
Potassium: 4.1 mEq/L (ref 3.5–5.1)
Sodium: 142 mEq/L (ref 135–145)
Total Bilirubin: 0.4 mg/dL (ref 0.2–1.2)
Total Protein: 6.7 g/dL (ref 6.0–8.3)

## 2019-08-12 LAB — CBC WITH DIFFERENTIAL/PLATELET
Basophils Absolute: 0.1 10*3/uL (ref 0.0–0.1)
Basophils Relative: 0.9 % (ref 0.0–3.0)
Eosinophils Absolute: 0.2 10*3/uL (ref 0.0–0.7)
Eosinophils Relative: 2.7 % (ref 0.0–5.0)
HCT: 37.4 % (ref 36.0–46.0)
Hemoglobin: 12.2 g/dL (ref 12.0–15.0)
Lymphocytes Relative: 17.9 % (ref 12.0–46.0)
Lymphs Abs: 1.1 10*3/uL (ref 0.7–4.0)
MCHC: 32.5 g/dL (ref 30.0–36.0)
MCV: 90.2 fl (ref 78.0–100.0)
Monocytes Absolute: 0.6 10*3/uL (ref 0.1–1.0)
Monocytes Relative: 9.5 % (ref 3.0–12.0)
Neutro Abs: 4.2 10*3/uL (ref 1.4–7.7)
Neutrophils Relative %: 69 % (ref 43.0–77.0)
Platelets: 262 10*3/uL (ref 150.0–400.0)
RBC: 4.15 Mil/uL (ref 3.87–5.11)
RDW: 14.8 % (ref 11.5–15.5)
WBC: 6.1 10*3/uL (ref 4.0–10.5)

## 2019-08-12 LAB — PROTIME-INR
INR: 1.1 ratio — ABNORMAL HIGH (ref 0.8–1.0)
Prothrombin Time: 11.9 s (ref 9.6–13.1)

## 2019-08-12 MED ORDER — GABAPENTIN 300 MG PO CAPS
300.0000 mg | ORAL_CAPSULE | Freq: Three times a day (TID) | ORAL | 3 refills | Status: DC | PRN
Start: 2019-08-12 — End: 2020-03-18

## 2019-08-12 NOTE — Progress Notes (Signed)
Subjective:    Patient ID: [name] [name], female    DOB: October 02, 1955, 64 y.o.   MRN: [id number]  HPI  Patient is a 64 yr old female who presents today for pre-operative evaluation. She will be having Left shoulder surgery with Dr. Veverly Fells.   Wt Readings from Last 3 Encounters:  08/12/19 258 lb (117 kg)  03/01/19 242 lb (109.8 kg)  11/21/18 232 lb 3.2 oz (105.3 kg)    Bilateral LE edema- she is currently on 2.5mg  once daily of prednisone (per rheumatology and will be decreasing further to QOD next month). She is looking forward to coming off of prednisone due to weight and other side effects.  Reports that she had some furosemide on hand and tried that without improvement in her LE edema.   Tried gabapentin which belonged to her mother (300mg ). Had good relief of foot pain and was able to go shopping. She is requesting an rx of her own.   She has used motrin, tylenol arthritis, nothing helps. Pain level is "horrible."   Review of Systems  Constitutional: Positive for unexpected weight change (secondary to prednisone).  HENT: Negative for hearing loss and rhinorrhea.   Eyes: Negative for visual disturbance.  Respiratory: Negative for cough.   Cardiovascular: Positive for leg swelling. Negative for chest pain.  Gastrointestinal: Negative for blood in stool, constipation and diarrhea.  Genitourinary: Negative for dysuria, frequency and hematuria.  Musculoskeletal: Positive for arthralgias.  Skin: Negative for rash.  Neurological: Negative for headaches.  Hematological: Negative for adenopathy.  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   Elevated alkaline phosphatase level    negative workup (presumed secondary to fatty liver)   Frequency-urgency syndrome    GERD (gastroesophageal reflux disease)     none since weight loss surgery   History of kidney stones    seen on Pet scan   Interstitial cystitis    Dr Vernie Shanks   Lymphadenopathy of head and neck 11/09/2018   Neuromuscular disorder (HCC)    rt arm carpal tunnel syndrome   Obesity    status post bariatric procedure in [place] 2005   Osteoporosis    Thyroid nodule 7/10   `   Thyroid nodule    Varicose vein of leg      Social History   Socioeconomic History   Marital status: Significant Other    Spouse name: Not on file   Number of children: 1   Years of education: Not on file   Highest education level: Not on file  Occupational History   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[employer]  Tobacco Use   Smoking status: Never Smoker   Smokeless tobacco: Never Used  Scientific laboratory technician Use: Never used  Substance and Sexual Activity   Alcohol use: Yes    Comment: RARE   Drug use: No   Sexual activity: Yes    Comment: 1st intercourse 64 yo-More than 5 partners  Other Topics Concern   Not on file  Social History Narrative   Not on file   Social Determinants of Health   Financial Resource Strain:    Difficulty of Paying Living Expenses:   Food Insecurity:    Worried About  Running Out of Food in the Last Year:    Arboriculturist in the Last Year:   Transportation Needs:    Film/video editor (Medical):    Lack of Transportation (Non-Medical):   Physical Activity:    Days of Exercise per Week:    Minutes of Exercise per Session:   Stress:    Feeling of Stress :   Social Connections:    Frequency of Communication with Friends and Family:    Frequency of Social Gatherings with Friends and Family:    Attends Religious Services:    Active Member of Clubs or Organizations:    Attends Music therapist:    Marital Status:   Intimate Partner Violence:    Fear of Current or Ex-Partner:    Emotionally Abused:    Physically Abused:    Sexually  Abused:     Past Surgical History:  Procedure Laterality Date   ANTERIOR CERVICAL DECOMP/DISCECTOMY FUSION N/A 06/21/2012   Procedure: ANTERIOR CERVICAL DECOMPRESSION/DISCECTOMY FUSION C-4 - C5 (SPACER/DePUY CERVICAL PLATE ONLY) 1 LEVEL;  Surgeon: Melina Schools, MD;  Location: [place];  Service: Orthopedics;  Laterality: N/A;   BLADDER SURGERY  02/21/12 and 02/28/12   Dr Diona Fanti   COLPOSCOPY  1980   CRYOTHERAPY  1980   CYSTO WITH HYDRODISTENSION  12/12/2011   Procedure: CYSTOSCOPY/HYDRODISTENSION;  Surgeon: Franchot Gallo, MD;  Location: [facility];  Service: Urology;  Laterality: N/A;  [place]   bilateral cataract extraction   FOOT SURGERY     bilateral bunionettes   FOOT SURGERY Right 01/07/14   Pt states surgery was due to arthritis. "Has pins in place now"   GASTRIC BYPASS  2005   JOINT REPLACEMENT  2008   left total knee   KNEE SURGERY  1997   left knee   LYMPH NODE DISSECTION Right 11/09/2018   Procedure: EXCISE DEEP RIGHT CERVICAL LYMPH NODE;  Surgeon: Fanny Skates, MD;  Location: [place];  Service: General;  Laterality: Right;   NASAL SINUS SURGERY  1007   Dr Erik Obey   REFRACTIVE SURGERY  2006   lasik   SHOULDER SURGERY  2 2012   RIGHT    STOMACH SURGERY  2007   "tummy tuck"   TOTAL KNEE ARTHROPLASTY Right 12/31/2015   Procedure: RIGHT TOTAL KNEE ARTHROPLASTY;  Surgeon: [name], MD;  Location: [facility];  Service: Orthopedics;  Laterality: Right;  Adductor Block   VARICOSE VEIN SURGERY Right 02/2012   leg    Family History  Problem Relation Age of Onset   Diabetes Paternal Grandfather    Stroke Paternal Grandfather    Hyperlipidemia Mother    Hypertension Mother    Hypertension Father    Diabetes Father    Prostate cancer Father    Dementia Father    Lung cancer Father    Heart disease Maternal Grandmother    Hypertension Maternal Grandmother    Heart disease Maternal Grandfather     Hypertension Maternal Grandfather    Stroke Maternal Grandfather    Arthritis Brother    Testicular cancer Brother    Colon cancer Neg Hx    Esophageal cancer Neg Hx    Pancreatic cancer Neg Hx    Stomach cancer Neg Hx     Allergies  Allergen Reactions   Nitrofurantoin     REACTION: fever    Current Outpatient Medications on File Prior to Visit  Medication Sig Dispense Refill  acetaminophen (TYLENOL) 650 MG CR tablet Take 2 tablets (1,300 mg total) by mouth every 8 (eight) hours as needed for pain. Take for mild pain. Do not combine with Percocet. Do not exceed daily recommended limit of Tylenol.     ALPRAZolam (XANAX) 0.5 MG tablet Take 1 tablet (0.5 mg total) by mouth 2 (two) times daily as needed for anxiety. 60 tablet 1   amitriptyline (ELAVIL) 50 MG tablet TAKE 1 TABLET BY MOUTH EVERY EVENING  3   azelastine (ASTELIN) 0.1 % nasal spray Place 1 spray into both nostrils 2 (two) times daily. Use in each nostril as directed (Patient taking differently: Place 1 spray into both nostrils 2 (two) times daily as needed for allergies. Use in each nostril as directed) 30 mL 12   calcium carbonate (OSCAL) 1500 (600 Ca) MG TABS tablet Take 600 mg of elemental calcium by mouth 2 (two) times daily with a meal.     Cholecalciferol (VITAMIN D3) 75 MCG (3000 UT) TABS Take 1 tablet by mouth daily. 30 tablet    Ferrous Sulfate (IRON) 325 (65 Fe) MG TABS Take 1 tablet (325 mg total) by mouth daily. 30 each 0   Fexofenadine HCl (ALLEGRA PO) Take 180 mg by mouth daily.      fluticasone (FLONASE) 50 MCG/ACT nasal spray Place 2 sprays into both nostrils daily. 16 g 6   Lifitegrast (XIIDRA) 5 % SOLN Place 1 drop into both eyes 2 (two) times daily.      Multiple Vitamin (MULTIVITAMIN) tablet Take 1 tablet by mouth daily.      multivitamin-lutein (OCUVITE-LUTEIN) CAPS capsule Take 1 capsule daily by mouth.     MYRBETRIQ 50 MG TB24 tablet Take 50 mg by mouth every evening.   2    omeprazole (PRILOSEC) 40 MG capsule TAKE 1 CAPSULE BY MOUTH EVERY DAY 301 capsule 1   OVER THE COUNTER MEDICATION Place 1 drop into both eyes 2 (two) times daily. Allergy eye drops     PARoxetine (PAXIL) 40 MG tablet TAKE 1 TABLET BY MOUTH EVERY DAY IN THE MORNING 90 tablet 0   valACYclovir (VALTREX) 1000 MG tablet Take 0.5 tablets (500 mg total) by mouth daily. 30 tablet 11   VITAMIN A PO Take 1 capsule by mouth daily.      vitamin C (ASCORBIC ACID) 500 MG tablet Take 500 mg by mouth daily.     Vitamin D, Ergocalciferol, (DRISDOL) 1.25 MG (50000 UT) CAPS capsule Take 1 capsule (50,000 Units total) by mouth every 7 (seven) days. 12 capsule 0   zinc gluconate 50 MG tablet Take 50 mg by mouth daily.     No current facility-administered medications on file prior to visit.    BP 130/70 (BP Location: Right Arm, Patient Position: Sitting, Cuff Size: Large)    Pulse 100    Temp (!) 97.5 F (36.4 C) (Temporal)    Resp 16    Ht 5\' 4"  (1.626 m)    Wt 258 lb (117 kg)    SpO2 97%    BMI 44.29 kg/m      Objective:   Physical Exam Constitutional:      Appearance: She is well-developed.  HENT:     Right Ear: Tympanic membrane and ear canal normal.     Left Ear: Tympanic membrane and ear canal normal.  Neck:     Thyroid: No thyromegaly.  Cardiovascular:     Rate and Rhythm: Normal rate and regular rhythm.     Heart sounds:  Normal heart sounds. No murmur heard.   Pulmonary:     Effort: Pulmonary effort is normal. No respiratory distress.     Breath sounds: Normal breath sounds. No wheezing.  Abdominal:     Tenderness: There is no abdominal tenderness.  Musculoskeletal:     Cervical back: Neck supple.     Right lower leg: 2+ Edema present.     Left lower leg: 2+ Edema present.  Skin:    General: Skin is warm and dry.  Neurological:     Mental Status: She is alert and oriented to person, place, and time.  Psychiatric:        Behavior: Behavior normal.        Thought Content:  Thought content normal.        Judgment: Judgment normal.           Assessment & Plan:  Pre-operative evaluation- will obtain CMET, CBC, urine culture and chest x-ray.  EKG tracing is personally reviewed.  EKG notes NSR with RBBB.  No acute changes.   LE edema- hopefully this will improve as she tapers off of prednisone.  She did not show improvement with furosemide. Monitor.   Bilateral foot pain- reports improvement with use of prn gabapentin. Rx provided.   This visit occurred during the SARS-CoV-2 public health emergency.  Safety protocols were in place, including screening questions prior to the visit, additional usage of staff PPE, and extensive cleaning of exam room while observing appropriate contact time as indicated for disinfecting solutions.

## 2019-08-13 LAB — URINE CULTURE
MICRO NUMBER:: 10641673
Result:: NO GROWTH
SPECIMEN QUALITY:: ADEQUATE

## 2019-08-28 ENCOUNTER — Encounter: Payer: Self-pay | Admitting: Family

## 2019-09-01 ENCOUNTER — Encounter: Payer: Self-pay | Admitting: Family

## 2019-09-01 ENCOUNTER — Other Ambulatory Visit: Payer: Self-pay | Admitting: Family

## 2019-09-03 NOTE — H&P (Signed)
Patient's anticipated LOS is less than 2 midnights, meeting these requirements: - Younger than 56 - Lives within 1 hour of care - Has a competent adult at home to recover with post-op recover - NO history of  - Chronic pain requiring opiods  - Diabetes  - Coronary Artery Disease  - Heart failure  - Heart attack  - Stroke  - DVT/VTE  - Cardiac arrhythmia  - Respiratory Failure/COPD  - Renal failure  - Anemia  - Advanced Liver disease       MANHA AMATO is an 64 y.o. female.    Chief Complaint: left shoulder pain  HPI: Pt is a 64 y.o. female complaining of left shoulder pain for multiple years. Pain had continually increased since the beginning. X-rays in the clinic show end-stage arthritic changes of the left shoulder. Pt has tried various conservative treatments which have failed to alleviate their symptoms, including injections and therapy. Various options are discussed with the patient. Risks, benefits and expectations were discussed with the patient. Patient understand the risks, benefits and expectations and wishes to proceed with surgery.   PCP:  [name], NP  D/C Plans: Home  PMH: Past Medical History:  Diagnosis Date  . Allergy   . Anemia    takes iron supplement  . Anxiety   . Arthritis   . 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 Elevated alkaline phosphatase level    negative workup (presumed secondary to fatty liver)  . Frequency-urgency syndrome   . GERD (gastroesophageal reflux disease)    none since weight loss surgery  . History of kidney stones    seen on Pet scan  . Interstitial cystitis    Dr Vernie Shanks  . Lymphadenopathy of head and neck 11/09/2018  . Neuromuscular disorder (HCC)    rt arm carpal tunnel syndrome  . Obesity    status post bariatric procedure in [facility] 2005  . Osteoporosis   . Thyroid nodule 7/10   `  . Thyroid nodule   . Varicose vein of leg     PSH: Past Surgical History:  Procedure Laterality Date  .  ANTERIOR CERVICAL DECOMP/DISCECTOMY FUSION N/A 06/21/2012   Procedure: ANTERIOR CERVICAL DECOMPRESSION/DISCECTOMY FUSION C-4 - C5 (SPACER/DePUY CERVICAL PLATE ONLY) 1 LEVEL;  Surgeon: Melina Schools, MD;  Location: [place];  Service: Orthopedics;  Laterality: N/A;  . BLADDER SURGERY  02/21/12 and 02/28/12   Dr Diona Fanti  . COLPOSCOPY  1980  . CRYOTHERAPY  1980  . CYSTO WITH HYDRODISTENSION  12/12/2011   Procedure: CYSTOSCOPY/HYDRODISTENSION;  Surgeon: Franchot Gallo, MD;  Location: [facility];  Service: Urology;  Laterality: N/A;  30 MIN   . EYE SURGERY  1997   bilateral cataract extraction  . FOOT SURGERY     bilateral bunionettes  . FOOT SURGERY Right 01/07/14   Pt states surgery was due to arthritis. "Has pins in place now"  . GASTRIC BYPASS  2005  . JOINT REPLACEMENT  2008   left total knee  . KNEE SURGERY  1997   left knee  . LYMPH NODE DISSECTION Right 11/09/2018   Procedure: EXCISE DEEP RIGHT CERVICAL LYMPH NODE;  Surgeon: Fanny Skates, MD;  Location: [place];  Service: General;  Laterality: Right;  . NASAL SINUS SURGERY  6073   Dr Erik Obey  . REFRACTIVE SURGERY  2006   lasik  . SHOULDER SURGERY  2 2012   RIGHT   . STOMACH SURGERY  2007   "tummy tuck"  . TOTAL  KNEE ARTHROPLASTY Right 12/31/2015   Procedure: RIGHT TOTAL KNEE ARTHROPLASTY;  Surgeon: [name], MD;  Location: [facility];  Service: Orthopedics;  Laterality: Right;  Adductor Block  . VARICOSE VEIN SURGERY Right 02/2012   leg    Social History:  reports that she has never smoked. She has never used smokeless tobacco. She reports current alcohol use. She reports that she does not use drugs.  Allergies:  Allergies  Allergen Reactions  . Nitrofurantoin     REACTION: fever    Medications: No current facility-administered medications for this encounter.   Current Outpatient Medications  Medication Sig Dispense Refill  . acetaminophen (TYLENOL) 650 MG CR tablet Take 2 tablets (1,300 mg total) by  mouth every 8 (eight) hours as needed for pain. Take for mild pain. Do not combine with Percocet. Do not exceed daily recommended limit of Tylenol.    . ALPRAZolam (XANAX) 0.5 MG tablet TAKE 1 TABLET (0.5 MG TOTAL) BY MOUTH 2 (TWO) TIMES DAILY AS NEEDED FOR ANXIETY. 60 tablet 1  . amitriptyline (ELAVIL) 50 MG tablet TAKE 1 TABLET BY MOUTH EVERY EVENING  3  . azelastine (ASTELIN) 0.1 % nasal spray Place 1 spray into both nostrils 2 (two) times daily. Use in each nostril as directed (Patient taking differently: Place 1 spray into both nostrils 2 (two) times daily as needed for allergies. Use in each nostril as directed) 30 mL 12  . calcium carbonate (OSCAL) 1500 (600 Ca) MG TABS tablet Take 600 mg of elemental calcium by mouth 2 (two) times daily with a meal.    . Cholecalciferol (VITAMIN D3) 75 MCG (3000 UT) TABS Take 1 tablet by mouth daily. 30 tablet   . Ferrous Sulfate (IRON) 325 (65 Fe) MG TABS Take 1 tablet (325 mg total) by mouth daily. 30 each 0  . Fexofenadine HCl (ALLEGRA PO) Take 180 mg by mouth daily.     . fluticasone (FLONASE) 50 MCG/ACT nasal spray Place 2 sprays into both nostrils daily. 16 g 6  . gabapentin (NEURONTIN) 300 MG capsule Take 1 capsule (300 mg total) by mouth 3 (three) times daily as needed. For foot pain 90 capsule 3  . Lifitegrast (XIIDRA) 5 % SOLN Place 1 drop into both eyes 2 (two) times daily.     . Multiple Vitamin (MULTIVITAMIN) tablet Take 1 tablet by mouth daily.     . multivitamin-lutein (OCUVITE-LUTEIN) CAPS capsule Take 1 capsule daily by mouth.    Marland Kitchen MYRBETRIQ 50 MG TB24 tablet Take 50 mg by mouth every evening.   2  . omeprazole (PRILOSEC) 40 MG capsule TAKE 1 CAPSULE BY MOUTH EVERY DAY 301 capsule 1  . OVER THE COUNTER MEDICATION Place 1 drop into both eyes 2 (two) times daily. Allergy eye drops    . PARoxetine (PAXIL) 40 MG tablet TAKE 1 TABLET BY MOUTH EVERY DAY IN THE MORNING 90 tablet 0  . valACYclovir (VALTREX) 1000 MG tablet Take 0.5 tablets (500 mg  total) by mouth daily. 30 tablet 11  . VITAMIN A PO Take 1 capsule by mouth daily.     . vitamin C (ASCORBIC ACID) 500 MG tablet Take 500 mg by mouth daily.    . Vitamin D, Ergocalciferol, (DRISDOL) 1.25 MG (50000 UT) CAPS capsule Take 1 capsule (50,000 Units total) by mouth every 7 (seven) days. 12 capsule 0  . zinc gluconate 50 MG tablet Take 50 mg by mouth daily.      No results found for this or any previous  visit (from the past 48 hour(s)). No results found.  ROS: Pain with rom of the left upper extremity  Physical Exam: Alert and oriented 64 y.o. female in no acute distress Cranial nerves 2-12 intact Cervical spine: full rom with no tenderness, nv intact distally Chest: active breath sounds bilaterally, no wheeze rhonchi or rales Heart: regular rate and rhythm, no murmur Abd: non tender non distended with active bowel sounds Hip is stable with rom  Left shoulder with painful and weak rom nv intact distally No rashes or edema distally  Assessment/Plan Assessment: left shoulder cuff arthropathy  Plan:  Patient will undergo a left reverse total shoulder by Dr. Veverly Fells at [facility] Risks benefits and expectations were discussed with the patient. Patient understand risks, benefits and expectations and wishes to proceed. Preoperative templating of the joint replacement has been completed, documented, and submitted to the Operating Room personnel in order to optimize intra-operative equipment management.   Merla Riches PA-C, MPAS [facility] is now [employer] [address] [place], [place], [place] [address] Phone: [phone] [email or web address] Facebook  [employer]

## 2019-09-04 DIAGNOSIS — D869 Sarcoidosis, unspecified: Secondary | ICD-10-CM | POA: Diagnosis not present

## 2019-09-04 DIAGNOSIS — M8580 Other specified disorders of bone density and structure, unspecified site: Secondary | ICD-10-CM | POA: Diagnosis not present

## 2019-09-04 DIAGNOSIS — M2550 Pain in unspecified joint: Secondary | ICD-10-CM | POA: Diagnosis not present

## 2019-09-16 NOTE — Patient Instructions (Addendum)
DUE TO COVID-19 ONLY ONE VISITOR IS ALLOWED TO COME WITH YOU AND STAY IN THE WAITING ROOM ONLY DURING PRE OP AND PROCEDURE DAY OF SURGERY. THE 2 VISITORS MAY VISIT WITH YOU AFTER SURGERY IN YOUR PRIVATE ROOM DURING VISITING HOURS ONLY!  YOU NEED TO HAVE A COVID 19 TEST ON__8/10_____ @_2 :00_____, THIS TEST MUST BE DONE BEFORE SURGERY, COME  [place] , [address].  [facility]) , COVID TESTING SITE [place] [address], IT IS ON THE RIGHT GOING OUT [address] APPROXITAMELTELY 2 MINUTES PAST [employer] ON THE RIGHT. ONCE YOUR COVID TEST IS COMPLETED,  PLEASE BEGIN THE QUARANTINE INSTRUCTIONS AS OUTLINED IN YOUR HANDOUT.                [name] [name]   Your procedure is scheduled on: 09/27/19   Report to [facility] Main  Entrance   Report to admitting at   7:30 AM     Call this number if you have problems the morning of surgery [phone]    . BRUSH YOUR TEETH MORNING OF SURGERY AND RINSE YOUR MOUTH OUT, NO CHEWING GUM CANDY OR MINTS.   No food after midnight.    You may have clear liquid until 7:00  AM.    At 7:00  AM drink pre surgery drink  . Nothing by mouth after 7:00 AM.    Take these medicines the morning of surgery with A SIP OF WATER: Paxil, Myrbetriq, Omeprizol, use your eye drops                                 You may not have any metal on your body including hair pins and              piercings  Do not wear jewelry, make-up, lotions, powders or perfumes, deodorant             Do not wear nail polish on your fingernails.  Do not shave  48 hours prior to surgery.            Do not bring valuables to the hospital. [place].  Contacts, dentures or bridgework may not be worn into surgery.      Patients discharged the day of surgery will not be allowed to drive home.   IF YOU ARE HAVING SURGERY AND GOING HOME THE SAME DAY, YOU MUST  HAVE AN ADULT TO DRIVE YOU HOME AND BE WITH YOU FOR 24 HOURS.  YOU MAY GO HOME BY TAXI OR UBER OR ORTHERWISE, BUT AN ADULT MUST ACCOMPANY YOU HOME AND STAY WITH YOU FOR 24 HOURS.  Name and phone number of your driver:  Special Instructions: N/A              Please read over the following fact sheets you were given: _____________________________________________________________________             [facility]- Preparing for Total Shoulder Arthroplasty    Before surgery, you can play an important role. Because skin is not sterile, your skin needs to be as free of germs as possible. You can reduce the number of germs on your skin by using the following products. . Benzoyl Peroxide Gel o Reduces the number of germs present on the skin o  Applied twice a day to shoulder area starting two days before surgery    ==================================================================  Please follow these instructions carefully:  BENZOYL PEROXIDE 5% GEL  Please do not use if you have an allergy to benzoyl peroxide.   If your skin becomes reddened/irritated stop using the benzoyl peroxide.  Starting two days before surgery, apply as follows: 1. Apply benzoyl peroxide in the morning and at night. Apply after taking a shower. If you are not taking a shower clean entire shoulder front, back, and side along with the armpit with a clean wet washcloth.  2. Place a quarter-sized dollop on your shoulder and rub in thoroughly, making sure to cover the front, back, and side of your shoulder, along with the armpit.   2 days before ____ AM   ____ PM              1 day before ____ AM   ____ PM                         3. Do this twice a day for two days.  (Last application is the night before surgery, AFTER using the CHG soap as described below).  4. Do NOT apply benzoyl peroxide gel on the day of surgery.   [place] - Preparing for Surgery  Before surgery, you can play an important role.   Because skin  is not sterile, your skin needs to be as free of germs as possible.   You can reduce the number of germs on your skin by washing with CHG (chlorahexidine gluconate) soap before surgery.   CHG is an antiseptic cleaner which kills germs and bonds with the skin to continue killing germs even after washing. Please DO NOT use if you have an allergy to CHG or antibacterial soaps.   If your skin becomes reddened/irritated stop using the CHG and inform your nurse when you arrive at Short Stay. Do not shave (including legs and underarms) for at least 48 hours prior to the first CHG shower.    Please follow these instructions carefully:  1.  Shower with CHG Soap the night before surgery and the  morning of Surgery.  2.  If you choose to wash your hair, wash your hair first as usual with your  normal  shampoo.  3.  After you shampoo, rinse your hair and body thoroughly to remove the  shampoo.                                        4.  Use CHG as you would any other liquid soap.  You can apply chg directly  to the skin and wash                       Gently with a scrungie or clean washcloth.  5.  Apply the CHG Soap to your body ONLY FROM THE NECK DOWN.   Do not use on face/ open                           Wound or open sores. Avoid contact with eyes, ears mouth and genitals (private parts).                       Wash face,  Development worker, international aid (private  parts) with your normal soap.             6.  Wash thoroughly, paying special attention to the area where your surgery  will be performed.  7.  Thoroughly rinse your body with warm water from the neck down.  8.  DO NOT shower/wash with your normal soap after using and rinsing off  the CHG Soap.             9.  Pat yourself dry with a clean towel.            10.  Wear clean pajamas.            11.  Place clean sheets on your bed the night of your first shower and do not  sleep with pets. Day of Surgery : Do not apply any lotions/deodorants the morning of surgery.   Please wear clean clothes to the hospital/surgery center.  FAILURE TO FOLLOW THESE INSTRUCTIONS MAY RESULT IN THE CANCELLATION OF YOUR SURGERY PATIENT SIGNATURE_________________________________  NURSE SIGNATURE__________________________________  ________________________________________________________________________   Adam Phenix  An incentive spirometer is a tool that can help keep your lungs clear and active. This tool measures how well you are filling your lungs with each breath. Taking long deep breaths may help reverse or decrease the chance of developing breathing (pulmonary) problems (especially infection) following:  A long period of time when you are unable to move or be active. BEFORE THE PROCEDURE   If the spirometer includes an indicator to show your best effort, your nurse or respiratory therapist will set it to a desired goal.  If possible, sit up straight or lean slightly forward. Try not to slouch.  Hold the incentive spirometer in an upright position. INSTRUCTIONS FOR USE  1. Sit on the edge of your bed if possible, or sit up as far as you can in bed or on a chair. 2. Hold the incentive spirometer in an upright position. 3. Breathe out normally. 4. Place the mouthpiece in your mouth and seal your lips tightly around it. 5. Breathe in slowly and as deeply as possible, raising the piston or the ball toward the top of the column. 6. Hold your breath for 3-5 seconds or for as long as possible. Allow the piston or ball to fall to the bottom of the column. 7. Remove the mouthpiece from your mouth and breathe out normally. 8. Rest for a few seconds and repeat Steps 1 through 7 at least 10 times every 1-2 hours when you are awake. Take your time and take a few normal breaths between deep breaths. 9. The spirometer may include an indicator to show your best effort. Use the indicator as a goal to work toward during each repetition. 10. After each set of 10 deep  breaths, practice coughing to be sure your lungs are clear. If you have an incision (the cut made at the time of surgery), support your incision when coughing by placing a pillow or rolled up towels firmly against it. Once you are able to get out of bed, walk around indoors and cough well. You may stop using the incentive spirometer when instructed by your caregiver.  RISKS AND COMPLICATIONS  Take your time so you do not get dizzy or light-headed.  If you are in pain, you may need to take or ask for pain medication before doing incentive spirometry. It is harder to take a deep breath if you are having pain. AFTER USE  Rest and breathe slowly  and easily.  It can be helpful to keep track of a log of your progress. Your caregiver can provide you with a simple table to help with this. If you are using the spirometer at home, follow these instructions: [place] IF:   You are having difficultly using the spirometer.  You have trouble using the spirometer as often as instructed.  Your pain medication is not giving enough relief while using the spirometer.  You develop fever of 100.5 F (38.1 C) or higher. SEEK IMMEDIATE MEDICAL CARE IF:   You cough up bloody sputum that had not been present before.  You develop fever of 102 F (38.9 C) or greater.  You develop worsening pain at or near the incision site. MAKE SURE YOU:   Understand these instructions.  Will watch your condition.  Will get help right away if you are not doing well or get worse. Document Released: 06/13/2006 Document Revised: 04/25/2011 Document Reviewed: 08/14/2006 [facility] Patient Information 2014 [place], [place].   ________________________________________________________________________

## 2019-09-17 ENCOUNTER — Other Ambulatory Visit: Payer: Self-pay

## 2019-09-17 ENCOUNTER — Encounter (HOSPITAL_COMMUNITY): Payer: Self-pay

## 2019-09-17 ENCOUNTER — Encounter (HOSPITAL_COMMUNITY)
Admission: RE | Admit: 2019-09-17 | Discharge: 2019-09-17 | Disposition: A | Discharge status: Home / Self Care | Payer: BC Managed Care – PPO | Source: Ambulatory Visit | Attending: Orthopedic Surgery | Admitting: Orthopedic Surgery

## 2019-09-17 DIAGNOSIS — Z6841 Body Mass Index (BMI) 40.0 and over, adult: Secondary | ICD-10-CM | POA: Diagnosis not present

## 2019-09-17 DIAGNOSIS — Z9884 Bariatric surgery status: Secondary | ICD-10-CM | POA: Insufficient documentation

## 2019-09-17 DIAGNOSIS — Z01812 Encounter for preprocedural laboratory examination: Secondary | ICD-10-CM | POA: Insufficient documentation

## 2019-09-17 DIAGNOSIS — D869 Sarcoidosis, unspecified: Secondary | ICD-10-CM | POA: Diagnosis not present

## 2019-09-17 HISTORY — DX: Sarcoidosis of lung: D86.0

## 2019-09-17 LAB — CBC
HCT: 41.2 % (ref 36.0–46.0)
Hemoglobin: 12.7 g/dL (ref 12.0–15.0)
MCH: 29.1 pg (ref 26.0–34.0)
MCHC: 30.8 g/dL (ref 30.0–36.0)
MCV: 94.5 fL (ref 80.0–100.0)
Platelets: 253 10*3/uL (ref 150–400)
RBC: 4.36 MIL/uL (ref 3.87–5.11)
RDW: 13.9 % (ref 11.5–15.5)
WBC: 4.4 10*3/uL (ref 4.0–10.5)
nRBC: 0 % (ref 0.0–0.2)

## 2019-09-17 LAB — SURGICAL PCR SCREEN
MRSA, PCR: NEGATIVE
Staphylococcus aureus: NEGATIVE

## 2019-09-17 LAB — BASIC METABOLIC PANEL
Anion gap: 7 (ref 5–15)
BUN: 13 mg/dL (ref 8–23)
CO2: 27 mmol/L (ref 22–32)
Calcium: 8.9 mg/dL (ref 8.9–10.3)
Chloride: 106 mmol/L (ref 98–111)
Creatinine, Ser: 0.5 mg/dL (ref 0.44–1.00)
GFR calc Af Amer: >60 mL/min (ref >60)
GFR calc non Af Amer: >60 mL/min (ref >60)
Glucose, Bld: 103 mg/dL — ABNORMAL HIGH (ref 70–99)
Potassium: 4.2 mmol/L (ref 3.5–5.1)
Sodium: 140 mmol/L (ref 135–145)

## 2019-09-17 NOTE — Progress Notes (Signed)
COVID Vaccine Completed:Yes Date COVID Vaccine completed:07/05/19 COVID vaccine manufacturer:   Moderna     PCP - Dr. Inda Castle Cardiologist - no  Chest x-ray - 05/10/19 EKG - 08/12/19 Stress Test - no ECHO - no Cardiac Cath - no  Sleep Study - no CPAP -   Fasting Blood Sugar - NA Checks Blood Sugar _____ times a day  Blood Thinner Instructions:NA Aspirin Instructions: Last Dose:  Anesthesia review:   Patient denies shortness of breath, fever, cough and chest pain at PAT appointment yes   Patient verbalized understanding of instructions that were given to them at the PAT appointment. Patient was also instructed that they will need to review over the PAT instructions again at home before surgery. Yes  Pt denies SOB doing house work or climbing stairs . 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 She gained weight and developed pain and swelling in her feet as she was weaned off the prednisone.  She has had a cervical fusion and has full ROM in her neck.

## 2019-09-24 ENCOUNTER — Other Ambulatory Visit (HOSPITAL_COMMUNITY)
Admission: RE | Admit: 2019-09-24 | Discharge: 2019-09-24 | Disposition: A | Discharge status: Home / Self Care | Payer: BC Managed Care – PPO | Source: Ambulatory Visit | Attending: Orthopedic Surgery | Admitting: Orthopedic Surgery

## 2019-09-24 DIAGNOSIS — Z20822 Contact with and (suspected) exposure to covid-19: Secondary | ICD-10-CM | POA: Insufficient documentation

## 2019-09-24 DIAGNOSIS — Z01812 Encounter for preprocedural laboratory examination: Secondary | ICD-10-CM | POA: Diagnosis not present

## 2019-09-24 LAB — SARS CORONAVIRUS 2 (TAT 6-24 HRS): SARS Coronavirus 2: NEGATIVE

## 2019-09-27 ENCOUNTER — Inpatient Hospital Stay (HOSPITAL_COMMUNITY): Payer: BC Managed Care – PPO | Admitting: Physician Assistant

## 2019-09-27 ENCOUNTER — Inpatient Hospital Stay (HOSPITAL_COMMUNITY): Payer: BC Managed Care – PPO | Admitting: Certified Registered Nurse Anesthetist

## 2019-09-27 ENCOUNTER — Other Ambulatory Visit: Payer: Self-pay

## 2019-09-27 ENCOUNTER — Encounter (HOSPITAL_COMMUNITY)
Admission: RE | Disposition: A | Discharge status: Home / Self Care | Payer: Self-pay | Source: Other Acute Inpatient Hospital | Attending: Orthopedic Surgery

## 2019-09-27 ENCOUNTER — Encounter (HOSPITAL_COMMUNITY): Payer: Self-pay | Admitting: Orthopedic Surgery

## 2019-09-27 ENCOUNTER — Observation Stay (HOSPITAL_COMMUNITY): Payer: BC Managed Care – PPO

## 2019-09-27 ENCOUNTER — Observation Stay (HOSPITAL_COMMUNITY)
Admission: RE | Admit: 2019-09-27 | Discharge: 2019-09-28 | Disposition: A | Discharge status: Home / Self Care | Payer: BC Managed Care – PPO | Source: Other Acute Inpatient Hospital | Attending: Orthopedic Surgery | Admitting: Orthopedic Surgery

## 2019-09-27 DIAGNOSIS — S90851A Superficial foreign body, right foot, initial encounter: Secondary | ICD-10-CM | POA: Insufficient documentation

## 2019-09-27 DIAGNOSIS — M75102 Unspecified rotator cuff tear or rupture of left shoulder, not specified as traumatic: Secondary | ICD-10-CM | POA: Diagnosis not present

## 2019-09-27 DIAGNOSIS — D649 Anemia, unspecified: Secondary | ICD-10-CM | POA: Insufficient documentation

## 2019-09-27 DIAGNOSIS — M25512 Pain in left shoulder: Secondary | ICD-10-CM | POA: Diagnosis not present

## 2019-09-27 DIAGNOSIS — Z96612 Presence of left artificial shoulder joint: Secondary | ICD-10-CM

## 2019-09-27 DIAGNOSIS — M810 Age-related osteoporosis without current pathological fracture: Secondary | ICD-10-CM | POA: Insufficient documentation

## 2019-09-27 DIAGNOSIS — Z79899 Other long term (current) drug therapy: Secondary | ICD-10-CM | POA: Insufficient documentation

## 2019-09-27 DIAGNOSIS — E559 Vitamin D deficiency, unspecified: Secondary | ICD-10-CM | POA: Diagnosis not present

## 2019-09-27 DIAGNOSIS — W458XXA Other foreign body or object entering through skin, initial encounter: Secondary | ICD-10-CM | POA: Insufficient documentation

## 2019-09-27 DIAGNOSIS — Y9289 Other specified places as the place of occurrence of the external cause: Secondary | ICD-10-CM | POA: Insufficient documentation

## 2019-09-27 DIAGNOSIS — Y939 Activity, unspecified: Secondary | ICD-10-CM | POA: Insufficient documentation

## 2019-09-27 DIAGNOSIS — G8918 Other acute postprocedural pain: Secondary | ICD-10-CM | POA: Diagnosis not present

## 2019-09-27 DIAGNOSIS — M19012 Primary osteoarthritis, left shoulder: Secondary | ICD-10-CM | POA: Diagnosis not present

## 2019-09-27 DIAGNOSIS — K219 Gastro-esophageal reflux disease without esophagitis: Secondary | ICD-10-CM | POA: Insufficient documentation

## 2019-09-27 DIAGNOSIS — Y999 Unspecified external cause status: Secondary | ICD-10-CM | POA: Insufficient documentation

## 2019-09-27 DIAGNOSIS — I10 Essential (primary) hypertension: Secondary | ICD-10-CM | POA: Diagnosis not present

## 2019-09-27 HISTORY — PX: REVERSE SHOULDER ARTHROPLASTY: SHX5054

## 2019-09-27 HISTORY — PX: FOREIGN BODY REMOVAL: SHX962

## 2019-09-27 HISTORY — DX: Presence of left artificial shoulder joint: Z96.612

## 2019-09-27 SURGERY — ARTHROPLASTY, SHOULDER, TOTAL, REVERSE
Anesthesia: General | Site: Shoulder | Laterality: Right

## 2019-09-27 MED ORDER — ZINC GLUCONATE 50 MG PO TABS: 50.0000 mg | ORAL_TABLET | Freq: Every day | ORAL | Status: DC

## 2019-09-27 MED ORDER — PAROXETINE HCL 20 MG PO TABS
40.0000 mg | ORAL_TABLET | Freq: Every day | ORAL | Status: DC
  Administered 2019-09-28: 40 mg via ORAL
  Filled 2019-09-27: qty 2

## 2019-09-27 MED ORDER — MENTHOL 3 MG MT LOZG: 1.0000 | LOZENGE | OROMUCOSAL | Status: DC | PRN

## 2019-09-27 MED ORDER — ONDANSETRON HCL 4 MG/2ML IJ SOLN
INTRAMUSCULAR | Status: DC | PRN
  Administered 2019-09-27: 4 mg via INTRAVENOUS

## 2019-09-27 MED ORDER — OXYCODONE HCL 5 MG/5ML PO SOLN: 5.0000 mg | Freq: Once | ORAL | Status: DC | PRN

## 2019-09-27 MED ORDER — STERILE WATER FOR IRRIGATION IR SOLN
Status: DC | PRN
  Administered 2019-09-27 (×2): 1000 mL

## 2019-09-27 MED ORDER — METHOCARBAMOL 500 MG IVPB - SIMPLE MED
500.0000 mg | Freq: Four times a day (QID) | INTRAVENOUS | Status: DC | PRN
  Filled 2019-09-27: qty 50

## 2019-09-27 MED ORDER — METHOCARBAMOL 500 MG PO TABS: 500.0000 mg | ORAL_TABLET | Freq: Three times a day (TID) | ORAL | 1 refills | Status: DC | PRN

## 2019-09-27 MED ORDER — ONDANSETRON HCL 4 MG PO TABS: 4.0000 mg | ORAL_TABLET | Freq: Four times a day (QID) | ORAL | Status: DC | PRN

## 2019-09-27 MED ORDER — DOCUSATE SODIUM 100 MG PO CAPS
100.0000 mg | ORAL_CAPSULE | Freq: Two times a day (BID) | ORAL | Status: DC
  Administered 2019-09-27 – 2019-09-28 (×2): 100 mg via ORAL
  Filled 2019-09-27 (×2): qty 1

## 2019-09-27 MED ORDER — ONDANSETRON HCL 4 MG/2ML IJ SOLN: 4.0000 mg | Freq: Once | INTRAMUSCULAR | Status: DC | PRN

## 2019-09-27 MED ORDER — ASCORBIC ACID 500 MG PO TABS
1000.0000 mg | ORAL_TABLET | Freq: Every day | ORAL | Status: DC
  Administered 2019-09-27: 1000 mg via ORAL
  Filled 2019-09-27: qty 2

## 2019-09-27 MED ORDER — EPHEDRINE SULFATE-NACL 50-0.9 MG/10ML-% IV SOSY
PREFILLED_SYRINGE | INTRAVENOUS | Status: DC | PRN
  Administered 2019-09-27 (×2): 10 mg via INTRAVENOUS
  Administered 2019-09-27: 15 mg via INTRAVENOUS
  Administered 2019-09-27 (×2): 10 mg via INTRAVENOUS

## 2019-09-27 MED ORDER — FENTANYL CITRATE (PF) 100 MCG/2ML IJ SOLN
INTRAMUSCULAR | Status: AC
  Filled 2019-09-27: qty 2

## 2019-09-27 MED ORDER — HYDROCODONE-ACETAMINOPHEN 5-325 MG PO TABS
1.0000 | ORAL_TABLET | ORAL | Status: DC | PRN
  Administered 2019-09-27 – 2019-09-28 (×3): 1 via ORAL
  Filled 2019-09-27 (×3): qty 1

## 2019-09-27 MED ORDER — OXYCODONE HCL 5 MG PO TABS: 5.0000 mg | ORAL_TABLET | Freq: Once | ORAL | Status: DC | PRN

## 2019-09-27 MED ORDER — DIPHENHYDRAMINE HCL 25 MG PO CAPS: 25.0000 mg | ORAL_CAPSULE | Freq: Four times a day (QID) | ORAL | Status: DC | PRN

## 2019-09-27 MED ORDER — ALPRAZOLAM 0.5 MG PO TABS
0.5000 mg | ORAL_TABLET | Freq: Every evening | ORAL | Status: DC | PRN
  Administered 2019-09-27: 0.5 mg via ORAL
  Filled 2019-09-27: qty 1

## 2019-09-27 MED ORDER — DEXAMETHASONE SODIUM PHOSPHATE 10 MG/ML IJ SOLN
INTRAMUSCULAR | Status: DC | PRN
  Administered 2019-09-27: 4 mg via INTRAVENOUS

## 2019-09-27 MED ORDER — VALACYCLOVIR HCL 500 MG PO TABS
500.0000 mg | ORAL_TABLET | Freq: Every day | ORAL | Status: DC | PRN
  Filled 2019-09-27: qty 1

## 2019-09-27 MED ORDER — ONDANSETRON HCL 4 MG PO TABS: 4.0000 mg | ORAL_TABLET | Freq: Three times a day (TID) | ORAL | 0 refills | Status: DC | PRN

## 2019-09-27 MED ORDER — BUPIVACAINE-EPINEPHRINE (PF) 0.25% -1:200000 IJ SOLN
INTRAMUSCULAR | Status: DC | PRN
  Administered 2019-09-27: 15 mL

## 2019-09-27 MED ORDER — ACETAMINOPHEN 325 MG PO TABS: 325.0000 mg | ORAL_TABLET | Freq: Four times a day (QID) | ORAL | Status: DC | PRN

## 2019-09-27 MED ORDER — PANTOPRAZOLE SODIUM 40 MG PO TBEC
40.0000 mg | DELAYED_RELEASE_TABLET | Freq: Every day | ORAL | Status: DC
  Administered 2019-09-28: 40 mg via ORAL
  Filled 2019-09-27: qty 1

## 2019-09-27 MED ORDER — ADULT MULTIVITAMIN W/MINERALS CH
1.0000 | ORAL_TABLET | Freq: Every day | ORAL | Status: DC
  Administered 2019-09-27: 1 via ORAL
  Filled 2019-09-27: qty 1

## 2019-09-27 MED ORDER — SUCCINYLCHOLINE CHLORIDE 200 MG/10ML IV SOSY
PREFILLED_SYRINGE | INTRAVENOUS | Status: AC
  Filled 2019-09-27: qty 20

## 2019-09-27 MED ORDER — VITAMIN D 25 MCG (1000 UNIT) PO TABS
4000.0000 [IU] | ORAL_TABLET | Freq: Every day | ORAL | Status: DC
  Administered 2019-09-27: 4000 [IU] via ORAL
  Filled 2019-09-27: qty 4

## 2019-09-27 MED ORDER — METOCLOPRAMIDE HCL 5 MG/ML IJ SOLN: 5.0000 mg | Freq: Three times a day (TID) | INTRAMUSCULAR | Status: DC | PRN

## 2019-09-27 MED ORDER — SALINE SPRAY 0.65 % NA SOLN
1.0000 | Freq: Four times a day (QID) | NASAL | Status: DC | PRN
  Filled 2019-09-27: qty 44

## 2019-09-27 MED ORDER — FENTANYL CITRATE (PF) 100 MCG/2ML IJ SOLN: 25.0000 ug | INTRAMUSCULAR | Status: DC | PRN

## 2019-09-27 MED ORDER — AMITRIPTYLINE HCL 50 MG PO TABS
50.0000 mg | ORAL_TABLET | Freq: Every day | ORAL | Status: DC
  Administered 2019-09-27: 50 mg via ORAL
  Filled 2019-09-27: qty 1

## 2019-09-27 MED ORDER — KETOTIFEN FUMARATE 0.025 % OP SOLN
1.0000 [drp] | Freq: Two times a day (BID) | OPHTHALMIC | Status: DC | PRN
  Filled 2019-09-27: qty 5

## 2019-09-27 MED ORDER — CEFAZOLIN SODIUM-DEXTROSE 2-4 GM/100ML-% IV SOLN
2.0000 g | Freq: Four times a day (QID) | INTRAVENOUS | Status: AC
  Administered 2019-09-27 – 2019-09-28 (×3): 2 g via INTRAVENOUS
  Filled 2019-09-27 (×3): qty 100

## 2019-09-27 MED ORDER — IBUPROFEN 200 MG PO TABS: 200.0000 mg | ORAL_TABLET | Freq: Three times a day (TID) | ORAL | Status: DC | PRN

## 2019-09-27 MED ORDER — DEXAMETHASONE SODIUM PHOSPHATE 10 MG/ML IJ SOLN
INTRAMUSCULAR | Status: AC
  Filled 2019-09-27: qty 1

## 2019-09-27 MED ORDER — PHENYLEPHRINE HCL-NACL 10-0.9 MG/250ML-% IV SOLN
INTRAVENOUS | Status: DC | PRN
  Administered 2019-09-27: 50 ug/min via INTRAVENOUS

## 2019-09-27 MED ORDER — PROPOFOL 10 MG/ML IV BOLUS
INTRAVENOUS | Status: AC
  Filled 2019-09-27: qty 20

## 2019-09-27 MED ORDER — PROPOFOL 10 MG/ML IV BOLUS
INTRAVENOUS | Status: DC | PRN
  Administered 2019-09-27: 150 mg via INTRAVENOUS

## 2019-09-27 MED ORDER — ONDANSETRON HCL 4 MG/2ML IJ SOLN: 4.0000 mg | Freq: Four times a day (QID) | INTRAMUSCULAR | Status: DC | PRN

## 2019-09-27 MED ORDER — POLYETHYLENE GLYCOL 3350 17 G PO PACK: 17.0000 g | PACK | Freq: Every day | ORAL | Status: DC | PRN

## 2019-09-27 MED ORDER — METOCLOPRAMIDE HCL 5 MG PO TABS: 5.0000 mg | ORAL_TABLET | Freq: Three times a day (TID) | ORAL | Status: DC | PRN

## 2019-09-27 MED ORDER — SODIUM CHLORIDE 0.9 % IV SOLN: INTRAVENOUS | Status: DC

## 2019-09-27 MED ORDER — SODIUM CHLORIDE 0.9 % IR SOLN
Status: DC | PRN
  Administered 2019-09-27: 1000 mL

## 2019-09-27 MED ORDER — BISACODYL 10 MG RE SUPP: 10.0000 mg | Freq: Every day | RECTAL | Status: DC | PRN

## 2019-09-27 MED ORDER — LIDOCAINE 2% (20 MG/ML) 5 ML SYRINGE
INTRAMUSCULAR | Status: AC
  Filled 2019-09-27: qty 5

## 2019-09-27 MED ORDER — MORPHINE SULFATE (PF) 2 MG/ML IV SOLN: 0.5000 mg | INTRAVENOUS | Status: DC | PRN

## 2019-09-27 MED ORDER — BUPIVACAINE LIPOSOME 1.3 % IJ SUSP
INTRAMUSCULAR | Status: DC | PRN
Start: 2019-09-27 — End: 2019-09-27
  Administered 2019-09-27: 10 mL via PERINEURAL

## 2019-09-27 MED ORDER — SUCCINYLCHOLINE CHLORIDE 200 MG/10ML IV SOSY
PREFILLED_SYRINGE | INTRAVENOUS | Status: DC | PRN
  Administered 2019-09-27: 120 mg via INTRAVENOUS

## 2019-09-27 MED ORDER — CHLORHEXIDINE GLUCONATE 0.12 % MT SOLN
15.0000 mL | Freq: Once | OROMUCOSAL | Status: AC
  Administered 2019-09-27: 15 mL via OROMUCOSAL

## 2019-09-27 MED ORDER — MEPERIDINE HCL 50 MG/ML IJ SOLN: 6.2500 mg | INTRAMUSCULAR | Status: DC | PRN

## 2019-09-27 MED ORDER — BUPIVACAINE-EPINEPHRINE (PF) 0.5% -1:200000 IJ SOLN
INTRAMUSCULAR | Status: DC | PRN
  Administered 2019-09-27: 20 mL via PERINEURAL

## 2019-09-27 MED ORDER — CEFAZOLIN SODIUM-DEXTROSE 2-4 GM/100ML-% IV SOLN
2.0000 g | INTRAVENOUS | Status: AC
  Administered 2019-09-27: 2 g via INTRAVENOUS
  Filled 2019-09-27: qty 100

## 2019-09-27 MED ORDER — METHOCARBAMOL 500 MG PO TABS
500.0000 mg | ORAL_TABLET | Freq: Four times a day (QID) | ORAL | Status: DC | PRN
  Administered 2019-09-28: 500 mg via ORAL
  Filled 2019-09-27: qty 1

## 2019-09-27 MED ORDER — ONDANSETRON HCL 4 MG/2ML IJ SOLN
INTRAMUSCULAR | Status: AC
  Filled 2019-09-27: qty 2

## 2019-09-27 MED ORDER — MIRABEGRON ER 25 MG PO TB24
50.0000 mg | ORAL_TABLET | Freq: Every day | ORAL | Status: DC
  Administered 2019-09-27: 50 mg via ORAL
  Filled 2019-09-27: qty 2

## 2019-09-27 MED ORDER — LACTATED RINGERS IV SOLN: INTRAVENOUS | Status: DC

## 2019-09-27 MED ORDER — GABAPENTIN 300 MG PO CAPS
300.0000 mg | ORAL_CAPSULE | Freq: Three times a day (TID) | ORAL | Status: DC
  Administered 2019-09-27 – 2019-09-28 (×3): 300 mg via ORAL
  Filled 2019-09-27 (×3): qty 1

## 2019-09-27 MED ORDER — MIDAZOLAM HCL 2 MG/2ML IJ SOLN
1.0000 mg | INTRAMUSCULAR | Status: DC
  Administered 2019-09-27: 1 mg via INTRAVENOUS
  Filled 2019-09-27: qty 2

## 2019-09-27 MED ORDER — FENTANYL CITRATE (PF) 100 MCG/2ML IJ SOLN
50.0000 ug | INTRAMUSCULAR | Status: DC
  Administered 2019-09-27: 50 ug via INTRAVENOUS
  Filled 2019-09-27: qty 2

## 2019-09-27 MED ORDER — ACETAMINOPHEN 325 MG PO TABS
650.0000 mg | ORAL_TABLET | Freq: Three times a day (TID) | ORAL | Status: DC
  Administered 2019-09-27 – 2019-09-28 (×2): 650 mg via ORAL
  Filled 2019-09-27 (×3): qty 2

## 2019-09-27 MED ORDER — ORAL CARE MOUTH RINSE: 15.0000 mL | Freq: Once | OROMUCOSAL | Status: AC

## 2019-09-27 MED ORDER — SCOPOLAMINE 1 MG/3DAYS TD PT72
1.0000 | MEDICATED_PATCH | TRANSDERMAL | Status: DC
  Administered 2019-09-27: 1.5 mg via TRANSDERMAL
  Filled 2019-09-27: qty 1

## 2019-09-27 MED ORDER — PHENYLEPHRINE 40 MCG/ML (10ML) SYRINGE FOR IV PUSH (FOR BLOOD PRESSURE SUPPORT)
PREFILLED_SYRINGE | INTRAVENOUS | Status: DC | PRN
  Administered 2019-09-27: 120 ug via INTRAVENOUS

## 2019-09-27 MED ORDER — PHENOL 1.4 % MT LIQD: 1.0000 | OROMUCOSAL | Status: DC | PRN

## 2019-09-27 MED ORDER — LIFITEGRAST 5 % OP SOLN: 1.0000 [drp] | Freq: Two times a day (BID) | OPHTHALMIC | Status: DC

## 2019-09-27 MED ORDER — LORATADINE 10 MG PO TABS
10.0000 mg | ORAL_TABLET | Freq: Every day | ORAL | Status: DC
  Administered 2019-09-28: 10 mg via ORAL
  Filled 2019-09-27: qty 1

## 2019-09-27 MED ORDER — HYDROCODONE-ACETAMINOPHEN 5-325 MG PO TABS: 1.0000 | ORAL_TABLET | Freq: Four times a day (QID) | ORAL | 0 refills | Status: DC | PRN

## 2019-09-27 MED ORDER — BUPIVACAINE-EPINEPHRINE (PF) 0.25% -1:200000 IJ SOLN
INTRAMUSCULAR | Status: AC
  Filled 2019-09-27: qty 30

## 2019-09-27 MED ORDER — PHENYLEPHRINE HCL (PRESSORS) 10 MG/ML IV SOLN
INTRAVENOUS | Status: AC
  Filled 2019-09-27: qty 1

## 2019-09-27 SURGICAL SUPPLY — 72 items
BAG ZIPLOCK 12X15 (MISCELLANEOUS) IMPLANT
BIT DRILL 1.6MX128 (BIT) ×3 IMPLANT
BIT DRILL 170X2.5X (BIT) ×2 IMPLANT
BIT DRL 170X2.5X (BIT) ×2
BLADE SAG 18X100X1.27 (BLADE) ×3 IMPLANT
CLSR STERI-STRIP ANTIMIC 1/2X4 (GAUZE/BANDAGES/DRESSINGS) ×3 IMPLANT
COVER BACK TABLE 60X90IN (DRAPES) ×3 IMPLANT
COVER SURGICAL LIGHT HANDLE (MISCELLANEOUS) ×3 IMPLANT
COVER WAND RF STERILE (DRAPES) IMPLANT
CUP D38 DXTEND STAND PLUS 6 HU (Orthopedic Implant) ×3 IMPLANT
CUP STD D38 DXTEND PLUS 6 HU (Orthopedic Implant) ×2 IMPLANT
DECANTER SPIKE VIAL GLASS SM (MISCELLANEOUS) ×3 IMPLANT
DRAPE INCISE IOBAN 66X45 STRL (DRAPES) ×3 IMPLANT
DRAPE ORTHO SPLIT 77X108 STRL (DRAPES) ×6
DRAPE SHEET LG 3/4 BI-LAMINATE (DRAPES) ×3 IMPLANT
DRAPE SURG ORHT 6 SPLT 77X108 (DRAPES) ×4 IMPLANT
DRAPE TOP 10253 STERILE (DRAPES) ×3 IMPLANT
DRAPE U-SHAPE 47X51 STRL (DRAPES) ×3 IMPLANT
DRILL 2.5 (BIT) ×3
DRSG ADAPTIC 3X8 NADH LF (GAUZE/BANDAGES/DRESSINGS) ×3 IMPLANT
DRSG PAD ABDOMINAL 8X10 ST (GAUZE/BANDAGES/DRESSINGS) ×3 IMPLANT
DURAPREP 26ML APPLICATOR (WOUND CARE) ×3 IMPLANT
ELECT BLADE TIP CTD 4 INCH (ELECTRODE) ×3 IMPLANT
ELECT NEEDLE TIP 2.8 STRL (NEEDLE) ×3 IMPLANT
ELECT REM PT RETURN 15FT ADLT (MISCELLANEOUS) ×3 IMPLANT
EPI LT SZ 1 (Orthopedic Implant) ×3 IMPLANT
EPIPHYSIS LT SZ 1 (Orthopedic Implant) ×2 IMPLANT
GAUZE SPONGE 4X4 12PLY STRL (GAUZE/BANDAGES/DRESSINGS) ×3 IMPLANT
GLENOSPHERE DELTA XTEND LAT 38 (Miscellaneous) ×3 IMPLANT
GLOVE BIOGEL PI ORTHO PRO 7.5 (GLOVE) ×1
GLOVE BIOGEL PI ORTHO PRO SZ8 (GLOVE) ×1
GLOVE ORTHO TXT STRL SZ7.5 (GLOVE) ×3 IMPLANT
GLOVE PI ORTHO PRO STRL 7.5 (GLOVE) ×2 IMPLANT
GLOVE PI ORTHO PRO STRL SZ8 (GLOVE) ×2 IMPLANT
GLOVE SURG ORTHO 8.5 STRL (GLOVE) ×3 IMPLANT
GOWN STRL REUS W/TWL XL LVL3 (GOWN DISPOSABLE) ×6 IMPLANT
KIT BASIN OR (CUSTOM PROCEDURE TRAY) ×3 IMPLANT
KIT TURNOVER KIT A (KITS) IMPLANT
MANIFOLD NEPTUNE II (INSTRUMENTS) ×3 IMPLANT
METAGLENE DELTA EXTEND (Trauma) ×2 IMPLANT
METAGLENE DXTEND (Trauma) ×3 IMPLANT
NEEDLE MAYO 6 CRC TAPER PT (NEEDLE) ×3 IMPLANT
NEEDLE MAYO CATGUT SZ4 (NEEDLE) IMPLANT
NS IRRIG 1000ML POUR BTL (IV SOLUTION) ×3 IMPLANT
PACK SHOULDER (CUSTOM PROCEDURE TRAY) ×3 IMPLANT
PENCIL SMOKE EVACUATOR (MISCELLANEOUS) IMPLANT
PIN GUIDE 1.2 (PIN) ×3 IMPLANT
PIN GUIDE GLENOPHERE 1.5MX300M (PIN) ×3 IMPLANT
PIN METAGLENE 2.5 (PIN) ×3 IMPLANT
PIN STEINMAN FIXATION KNEE (PIN) ×3 IMPLANT
PROTECTOR NERVE ULNAR (MISCELLANEOUS) ×3 IMPLANT
RESTRAINT HEAD UNIVERSAL NS (MISCELLANEOUS) ×3 IMPLANT
SCREW 48L (Screw) ×3 IMPLANT
SCREW LOCK DELTA XTEND 4.5X30 (Screw) ×3 IMPLANT
SLING ARM FOAM STRAP LRG (SOFTGOODS) ×3 IMPLANT
SMARTMIX MINI TOWER (MISCELLANEOUS)
SPONGE LAP 4X18 RFD (DISPOSABLE) IMPLANT
STEM DELTA DIA 10 HA (Stem) ×3 IMPLANT
STRIP CLOSURE SKIN 1/2X4 (GAUZE/BANDAGES/DRESSINGS) ×3 IMPLANT
SUCTION FRAZIER HANDLE 10FR (MISCELLANEOUS) ×3
SUCTION TUBE FRAZIER 10FR DISP (MISCELLANEOUS) ×2 IMPLANT
SUT FIBERWIRE #2 38 T-5 BLUE (SUTURE) ×6
SUT MNCRL AB 4-0 PS2 18 (SUTURE) ×3 IMPLANT
SUT VIC AB 0 CT1 36 (SUTURE) ×6 IMPLANT
SUT VIC AB 0 CT2 27 (SUTURE) ×3 IMPLANT
SUT VIC AB 2-0 CT1 27 (SUTURE) ×3
SUT VIC AB 2-0 CT1 TAPERPNT 27 (SUTURE) ×2 IMPLANT
SUTURE FIBERWR #2 38 T-5 BLUE (SUTURE) ×4 IMPLANT
TAPE PAPER 3X10 WHT MICROPORE (GAUZE/BANDAGES/DRESSINGS) ×3 IMPLANT
TOWEL OR 17X26 10 PK STRL BLUE (TOWEL DISPOSABLE) ×3 IMPLANT
TOWER SMARTMIX MINI (MISCELLANEOUS) IMPLANT
YANKAUER SUCT BULB TIP 10FT TU (MISCELLANEOUS) ×3 IMPLANT

## 2019-09-27 NOTE — Anesthesia Postprocedure Evaluation (Signed)
Anesthesia Post Note  Patient: [name] [name]  Procedure(s) Performed: REVERSE SHOULDER ARTHROPLASTY (Left Shoulder) REMOVAL FOREIGN BOD RIGHT FOOT (Right Foot)     Patient location during evaluation: PACU Anesthesia Type: General Level of consciousness: awake and alert and oriented Pain management: pain level controlled Vital Signs Assessment: post-procedure vital signs reviewed and stable Respiratory status: spontaneous breathing, nonlabored ventilation and respiratory function stable Cardiovascular status: blood pressure returned to baseline and stable Postop Assessment: no apparent nausea or vomiting Anesthetic complications: no   No complications documented.  Last Vitals:  Vitals:   09/27/19 1245 09/27/19 1300  BP: 106/89 (!) 120/108  Pulse: 99 98  Resp: (!) 22 18  Temp:    SpO2: 98% 100%    Last Pain:  Vitals:   09/27/19 1300  TempSrc:   PainSc: 0-No pain                 Amanii Snethen A.

## 2019-09-27 NOTE — Interval H&P Note (Signed)
History and Physical Interval Note:  09/27/2019 9:21 AM  [name] [name]  has presented today for surgery, with the diagnosis of Left shoulder cuff arthropathy.  The various methods of treatment have been discussed with the patient and family. After consideration of risks, benefits and other options for treatment, the patient has consented to  Procedure(s) with comments: REVERSE SHOULDER ARTHROPLASTY (Left) - 2 hrs General with intrascalene block as a surgical intervention.  The patient's history has been reviewed, patient examined, no change in status, stable for surgery.  I have reviewed the patient's chart and labs.  Questions were answered to the patient's satisfaction.     [name]

## 2019-09-27 NOTE — Anesthesia Procedure Notes (Addendum)
Procedure Name: Intubation Date/Time: 09/27/2019 10:13 AM Performed by: West Pugh, CRNA Pre-anesthesia Checklist: Patient identified, Emergency Drugs available, Suction available, Patient being monitored and Timeout performed Patient Re-evaluated:Patient Re-evaluated prior to induction Oxygen Delivery Method: Circle system utilized Preoxygenation: Pre-oxygenation with 100% oxygen Induction Type: IV induction Ventilation: Mask ventilation without difficulty Laryngoscope Size: 3 and Glidescope Grade View: Grade I Tube type: Oral Tube size: 7.0 mm Number of attempts: 1 Airway Equipment and Method: Video-laryngoscopy and Rigid stylet Placement Confirmation: ETT inserted through vocal cords under direct vision,  positive ETCO2,  CO2 detector and breath sounds checked- equal and bilateral Secured at: 21 cm Tube secured with: Tape Dental Injury: Teeth and Oropharynx as per pre-operative assessment  Comments: Elective glidescope utilized d/t h/o acdf. AOI

## 2019-09-27 NOTE — Discharge Instructions (Signed)
Ice to the shoulder constantly.  Keep the incision covered and clean and dry for one week, then ok to get it wet in the shower.  Do exercise as instructed several times per day. Keep your arm propped forward, so you can see your elbow  DO NOT reach behind your back or push up out of a chair with the operative arm.  Use a sling while you are up and around for comfort, may remove while seated.  Keep pillow propped behind the operative elbow.  Follow up with Dr Veverly Fells in two weeks in the office, call [phone] for appt

## 2019-09-27 NOTE — Progress Notes (Signed)
Assisted Dr. Royce Macadamia with left, ultrasound guided, interscalene  block. Side rails up, monitors on throughout procedure. See vital signs in flow sheet. Tolerated Procedure well.

## 2019-09-27 NOTE — Op Note (Signed)
NAME: [name] [name], [name] [name] MEDICAL RECORD [id number] ACCOUNT [id number] DATE OF BIRTH:May 07, 1955 FACILITY: [facility] LOCATION: [facility] PHYSICIAN:STEVEN Orlena Sheldon, MD  OPERATIVE REPORT  DATE OF PROCEDURE:  09/27/2019  PREOPERATIVE DIAGNOSIS:  Left shoulder rotator cuff tear arthropathy.  POSTOPERATIVE DIAGNOSIS:  Left shoulder rotator cuff tear arthropathy.  PROCEDURE PERFORMED:  Left reverse total shoulder replacement with subscapularis repair utilizing DePuy Delta Xtend prosthesis.  ATTENDING SURGEON:  Esmond Plants, MD  ASSISTANT:  Narda Amber, PA-C, who was scrubbed during the entire procedure and necessary for satisfactory completion of surgery.  ANESTHESIA:  General anesthesia was used plus interscalene block.  ESTIMATED BLOOD LOSS:  150 mL.  FLUID REPLACEMENT:  1500 mL crystalloid.  INSTRUMENT COUNTS:  Correct.  COMPLICATIONS:  No complications.  ANTIBIOTICS:  Perioperative antibiotics were given.  INDICATIONS:  The patient is a 64 year old female with worsening left shoulder pain and dysfunction secondary to end-stage arthritis and rotator cuff tear arthropathy.  The patient has had progressive pain despite conservative management, presents for  operative treatment to restore function and eliminate pain to the shoulder.  Informed consent obtained.  DESCRIPTION OF PROCEDURE:  After an adequate level of anesthesia was achieved, the patient was positioned in modified beach chair position.  Left shoulder correctly identified and sterilely prepped and draped in the usual manner.  Time-out called,  verifying correct patient, correct site.  We initiated surgery using standard deltopectoral approach, starting at the coracoid process, extending down to the anterior humerus, dissection down through subcutaneous tissues using Bovie electrocautery.  We  identified cephalic vein, took that laterally with the deltoid, pectoralis taken medially.  Conjoined tendon identified and retracted  medially.  We tenodesed the biceps in situ with 0 Vicryl figure-of-eight suture.  We then went ahead and released the  subscapularis subperiosteally off the lesser tuberosity, tagging with #2 FiberWire suture in a modified Mason-Allen suture technique for repair at the end.  We released the inferior capsule, extending the shoulder and externally rotating delivering the  humeral head out of the wound.  The humerus was devoid of cartilage.  We entered the proximal humerus with a 6 mm reamer.  We reamed up to a size 10.  We then placed our 10 mm intramedullary guide.  We then went ahead and resected the humerus at 10  degrees of retroversion with the oscillating saw.  We removed osteophytes with a rongeur.  We then subluxed the humerus posteriorly and did a 360-degree capsular labral excision.  We had good exposure of the glenoid face, which was completely devoid of  cartilage.  We placed our guide pin centered low on the glenoid and then reamed for the metaglene baseplate.  We next did our peripheral hand reaming, then drilled out our central peg hole and impacted our metaglene baseplate into place.  We then placed  a 42 screw inferiorly and a 30 at the base of the coracoid process.  We had good screw purchase.  We locked the locking mechanisms and then irrigated thoroughly.  We could not do an anterior or posterior screw due to the small size of the patient's  glenoid.  Next, we selected a 38 standard glenosphere and placed that on the metaglene baseplate and screwed that into position.  We did a finger sweep so that the glenosphere was all the way down on the metaglene and then there was no soft tissue  incorporated into that interface.  Once we did that, we directed our attention back toward the humeral side.  We reamed for  the 1 left eccentric and then placed the 10 stem and trialed with the 1 left set on the 0 setting and placed in 10 degrees of  retroversion.  We reduced the shoulder with 38+3 trial.  We  felt like we could probably get the +6 in.  We then removed all trial components, drilled holes in lesser tuberosity, placed #2 FiberWire suture for repair of the subscap.  We then irrigated  thoroughly and then used available bone graft from the head in impaction grafting technique and impacted the HA coated press-fit 10 stem, 1 left metaphysis, set on the 0 setting and we impacted that in 10 degrees of retroversion.  We had excellent  stability for the stem and humeral construct.  We then went ahead and impacted a 38+6 real poly onto the humeral tray and reduced the shoulder.  It had nice little pop as it reduced.  Appropriate tension with no gapping with inferior pole or external  rotation.  Conjoined was appropriately tensioned.  The nerve was not too tight.  At this point, we irrigated again and then repaired the subscapularis anatomically back to bone.  We then did a deltopectoral repair with 0 Vicryl suture, followed by 2-0  Vicryl for subcutaneous closure and 4-0 Monocryl for skin.  Steri-Strips applied, followed by sterile dressing.  The patient tolerated surgery well.  VN/NUANCE  D:09/27/2019 T:09/27/2019 JOB:[id number]

## 2019-09-27 NOTE — Transfer of Care (Signed)
Immediate Anesthesia Transfer of Care Note  Patient: SARINITY DICICCO  Procedure(s) Performed: REVERSE SHOULDER ARTHROPLASTY (Left Shoulder) REMOVAL FOREIGN BOD RIGHT FOOT (Right Foot)  Patient Location: PACU  Anesthesia Type:GA combined with regional for post-op pain  Level of Consciousness: awake, drowsy and patient cooperative  Airway & Oxygen Therapy: Patient Spontanous Breathing and Patient connected to face mask oxygen  Post-op Assessment: Report given to RN and Post -op Vital signs reviewed and stable  Post vital signs: Reviewed and stable  Last Vitals:  Vitals Value Taken Time  BP 134/67 09/27/19 1227  Temp    Pulse 101 09/27/19 1230  Resp 21 09/27/19 1230  SpO2 96 % 09/27/19 1230  Vitals shown include unvalidated device data.  Last Pain:  Vitals:   09/27/19 0925  TempSrc:   PainSc: 0-No pain      Patients Stated Pain Goal: 3 (95/32/02 3343)  Complications: No complications documented.

## 2019-09-27 NOTE — Anesthesia Preprocedure Evaluation (Signed)
Anesthesia Evaluation  Patient identified by MRN, date of birth, ID band Patient awake    Reviewed: Allergy & Precautions, NPO status , Patient's Chart, lab work & pertinent test results  Airway Mallampati: III  TM Distance: >3 FB Neck ROM: Full    Dental no notable dental hx. (+) Teeth Intact, Dental Advisory Given   Pulmonary  Sarcoidosis   Pulmonary exam normal breath sounds clear to auscultation       Cardiovascular hypertension, Normal cardiovascular exam Rhythm:Regular Rate:Normal  On no Rx   Neuro/Psych Anxiety  Neuromuscular disease    GI/Hepatic Neg liver ROS, GERD  ,Hx/o bariatric surgery   Endo/Other  Morbid obesity  Renal/GU negative Renal ROS   Interstitial cystitis    Musculoskeletal  (+) Arthritis , Osteoarthritis,  Left shoulder rotator cuff arthropathy   Abdominal (+) + obese,   Peds  Hematology  (+) anemia ,   Anesthesia Other Findings   Reproductive/Obstetrics                             Anesthesia Physical Anesthesia Plan  ASA: III  Anesthesia Plan: General   Post-op Pain Management:  Regional for Post-op pain   Induction: Intravenous  PONV Risk Score and Plan: 4 or greater and Ondansetron and Treatment may vary due to age or medical condition  Airway Management Planned: Oral ETT  Additional Equipment:   Intra-op Plan:   Post-operative Plan: Extubation in OR  Informed Consent: I have reviewed the patients History and Physical, chart, labs and discussed the procedure including the risks, benefits and alternatives for the proposed anesthesia with the patient or authorized representative who has indicated his/her understanding and acceptance.     Dental advisory given  Plan Discussed with: CRNA and Anesthesiologist  Anesthesia Plan Comments:         Anesthesia Quick Evaluation

## 2019-09-27 NOTE — Progress Notes (Signed)
Patient states she stepped on "something" in bathroom at home last night. States her right heel is sore. Dark spot noted on right heel. Skin intact, no drainage. Notified Dr Veverly Fells.

## 2019-09-27 NOTE — Anesthesia Procedure Notes (Signed)
Anesthesia Regional Block: Interscalene brachial plexus block   Pre-Anesthetic Checklist: ,, timeout performed, Correct Patient, Correct Site, Correct Laterality, Correct Procedure, Correct Position, site marked, Risks and benefits discussed,  Surgical consent,  Pre-op evaluation,  At surgeon's request and post-op pain management  Laterality: Left  Prep: chloraprep       Needles:  Injection technique: Single-shot  Needle Type: Echogenic Stimulator Needle     Needle Length: 9cm  Needle Gauge: 21   Needle insertion depth: 6 cm   Additional Needles:   Procedures:,,,, ultrasound used (permanent image in chart),,,,  Narrative:  Start time: 09/27/2019 9:13 AM End time: 09/27/2019 9:18 AM Injection made incrementally with aspirations every 5 mL.  Performed by: Personally  Anesthesiologist: Josephine Igo, MD  Additional Notes: Timeout performed. Patient sedated. Relevant anatomy ID'd using [place]. Incremental 2-52ml injection of LA with frequent aspiration. Patient tolerated procedure well.        Left Interscalene Block

## 2019-09-27 NOTE — Brief Op Note (Signed)
09/27/2019  12:27 PM  PATIENT:  [name] [name]  64 y.o. female  PRE-OPERATIVE DIAGNOSIS:  Left shoulder cuff tear arthropathy  POST-OPERATIVE DIAGNOSIS:  Left shoulder cuff tear arthropathy  PROCEDURE:  Procedure(s) with comments: REVERSE SHOULDER ARTHROPLASTY (Left) - 2 hrs General with intrascalene block REMOVAL FOREIGN BOD RIGHT FOOT (Right) DePuy Delta Xtend with Subscap Repair  SURGEON:  Surgeon(s) and Role:    Netta Cedars, MD - Primary  PHYSICIAN ASSISTANT:   ASSISTANTS: Molli Barrows PA-C   ANESTHESIA:   regional and general  EBL:  125 mL   BLOOD ADMINISTERED:none  DRAINS: none   LOCAL MEDICATIONS USED:  MARCAINE     SPECIMEN:  No Specimen  DISPOSITION OF SPECIMEN:  N/A  COUNTS:  YES  TOURNIQUET:  * No tourniquets in log *  DICTATION: .Other Dictation: Dictation Number [id number]  PLAN OF CARE: Admit for overnight observation  PATIENT DISPOSITION:  PACU - hemodynamically stable.   Delay start of Pharmacological VTE agent (>24hrs) due to surgical blood loss or risk of bleeding: not applicable

## 2019-09-28 DIAGNOSIS — Z79899 Other long term (current) drug therapy: Secondary | ICD-10-CM | POA: Diagnosis not present

## 2019-09-28 DIAGNOSIS — Y999 Unspecified external cause status: Secondary | ICD-10-CM | POA: Diagnosis not present

## 2019-09-28 DIAGNOSIS — W458XXA Other foreign body or object entering through skin, initial encounter: Secondary | ICD-10-CM | POA: Diagnosis not present

## 2019-09-28 DIAGNOSIS — Y9289 Other specified places as the place of occurrence of the external cause: Secondary | ICD-10-CM | POA: Diagnosis not present

## 2019-09-28 DIAGNOSIS — M810 Age-related osteoporosis without current pathological fracture: Secondary | ICD-10-CM | POA: Diagnosis not present

## 2019-09-28 DIAGNOSIS — M25512 Pain in left shoulder: Secondary | ICD-10-CM | POA: Diagnosis not present

## 2019-09-28 DIAGNOSIS — S90851A Superficial foreign body, right foot, initial encounter: Secondary | ICD-10-CM | POA: Diagnosis not present

## 2019-09-28 DIAGNOSIS — K219 Gastro-esophageal reflux disease without esophagitis: Secondary | ICD-10-CM | POA: Diagnosis not present

## 2019-09-28 DIAGNOSIS — M75102 Unspecified rotator cuff tear or rupture of left shoulder, not specified as traumatic: Secondary | ICD-10-CM | POA: Diagnosis not present

## 2019-09-28 DIAGNOSIS — D649 Anemia, unspecified: Secondary | ICD-10-CM | POA: Diagnosis not present

## 2019-09-28 LAB — BASIC METABOLIC PANEL
Anion gap: 6 (ref 5–15)
BUN: 14 mg/dL (ref 8–23)
CO2: 24 mmol/L (ref 22–32)
Calcium: 8.1 mg/dL — ABNORMAL LOW (ref 8.9–10.3)
Chloride: 106 mmol/L (ref 98–111)
Creatinine, Ser: 0.5 mg/dL (ref 0.44–1.00)
GFR calc Af Amer: >60 mL/min (ref >60)
GFR calc non Af Amer: >60 mL/min (ref >60)
Glucose, Bld: 178 mg/dL — ABNORMAL HIGH (ref 70–99)
Potassium: 4.3 mmol/L (ref 3.5–5.1)
Sodium: 136 mmol/L (ref 135–145)

## 2019-09-28 LAB — HEMOGLOBIN AND HEMATOCRIT, BLOOD
HCT: 33.9 % — ABNORMAL LOW (ref 36.0–46.0)
Hemoglobin: 10.2 g/dL — ABNORMAL LOW (ref 12.0–15.0)

## 2019-09-28 NOTE — Plan of Care (Signed)
  Problem: Education: Goal: Knowledge of General Education information will improve Description: Including pain rating scale, medication(s)/side effects and non-pharmacologic comfort measures Outcome: Completed/Met   Problem: Clinical Measurements: Goal: Ability to maintain clinical measurements within normal limits will improve Outcome: Completed/Met   Problem: Clinical Measurements: Goal: Will remain free from infection Outcome: Completed/Met

## 2019-09-28 NOTE — Plan of Care (Signed)
Plan of care reviewed and discussed with the patient. 

## 2019-09-28 NOTE — Progress Notes (Signed)
    Subjective:  Patient reports pain as mild to moderate.  Denies N/V/CP/SOB. She is resting comfortably in the recliner eating breakfast  Objective:   VITALS:   Vitals:   09/27/19 1735 09/27/19 2110 09/28/19 0146 09/28/19 0507  BP: (!) 141/74 139/74 140/67 124/76  Pulse: 100 95 93 85  Resp: 17 16 18 16   Temp: 98.7 F (37.1 C) 97.9 F (36.6 C) 97.9 F (36.6 C) 97.8 F (36.6 C)  TempSrc:  Oral  Oral  SpO2: 94% 100% 95% 98%  Weight:      Height:        NAD ABD soft Neurovascular intact Sensation intact distally Intact pulses distally Dorsiflexion/Plantar flexion intact Incision: dressing C/D/I   Lab Results  Component Value Date   WBC 4.4 09/17/2019   HGB 10.2 (L) 09/28/2019   HCT 33.9 (L) 09/28/2019   MCV 94.5 09/17/2019   PLT 253 09/17/2019   BMET    Component Value Date/Time   NA 136 09/28/2019 0309   K 4.3 09/28/2019 0309   CL 106 09/28/2019 0309   CO2 24 09/28/2019 0309   GLUCOSE 178 (H) 09/28/2019 0309   BUN 14 09/28/2019 0309   CREATININE 0.50 09/28/2019 0309   CREATININE 0.52 03/01/2019 1615   CALCIUM 8.1 (L) 09/28/2019 0309   GFRNONAA >60 09/28/2019 0309   GFRNONAA >89 06/28/2013 0836   GFRAA >60 09/28/2019 0309   GFRAA >89 06/28/2013 0836     Assessment/Plan: 1 Day Post-Op   Active Problems:   H/O total shoulder replacement, left   WBAT with walker DVT ppx: SCDs, TEDS PO pain control PT/OT Dispo: D/C home  [name] [name] 09/28/2019, 9:42 AM   [facility] is now [facility] [address], [place], [place] [address] Phone: [phone] [email or web address] Facebook  [employer]

## 2019-09-28 NOTE — Evaluation (Addendum)
Occupational Therapy Evaluation Patient Details Name: [name] [name] MRN: [id number] DOB: 1955/07/26 Today's Date: 09/28/2019    History of Present Illness Patient is a 64 year old woman s/p left reverse TSA.   Clinical Impression   [name] [name] is a 64 year old woman s/p shoulder replacement without functional use of left non-dominant upper extremity secondary to effects of surgery and interscalene block as well as shoulder precautions. Therapist provided education and instruction to patient in regards to exercises, precautions, positioning, donning upper extremity clothing and bathing while maintaining shoulder precautions, ice and edema management and donning/doffing sling. Patient verbalized understanding and demonstrated skills as needed. Handout provided for maximum retention of education. Patient performed partial sponge bath at sink and toileting in bathroom. Patient needed assistance to donn shirt, underwear, pants and sling and provided with instruction on compensatory strategies to perform ADLs. Patient to follow up with MD for further therapy needs.      Follow Up Recommendations  Follow surgeon's recommendation for DC plan and follow-up therapies    Equipment Recommendations  Other (comment) (shower chair)    Recommendations for Other Services       Precautions / Restrictions Precautions Precautions: Shoulder Type of Shoulder Precautions: No AROM, No PROM, Active ROM of elbow, wrist and hand. Shoulder Interventions: Shoulder sling/immobilizer;At all times;Off for dressing/bathing/exercises Restrictions Weight Bearing Restrictions: Yes LUE Weight Bearing: Non weight bearing      Mobility Bed Mobility               General bed mobility comments: seated in recliner  Transfers                 General transfer comment: Min guard to ambulate.    Balance Overall balance assessment: Mild deficits observed, not formally tested                                          ADL either performed or assessed with clinical judgement   ADL Overall ADL's : Needs assistance/impaired Eating/Feeding: Independent   Grooming: Independent   Upper Body Bathing: Minimal assistance;Sitting   Lower Body Bathing: Sit to/from stand;Min guard   Upper Body Dressing : Maximal assistance;Sitting Upper Body Dressing Details (indicate cue type and reason): Assitance for Left and right extremity and buttoning. Lower Body Dressing: Minimal assistance;Sit to/from stand Lower Body Dressing Details (indicate cue type and reason): Min assist for pulling clothing over left hip and clothing tugging on skin folds. Toilet Transfer: Warehouse manager and Hygiene: Supervision/safety       Functional mobility during ADLs: Min guard General ADL Comments: min guard for safety     Vision Baseline Vision/History: Wears glasses Wears Glasses: Reading only       Perception     Praxis      Pertinent Vitals/Pain Pain Assessment: Faces Faces Pain Scale: Hurts a little bit Pain Location: L shoulder Pain Descriptors / Indicators: Aching;Grimacing Pain Intervention(s): Monitored during session     Hand Dominance Right   Extremity/Trunk Assessment Upper Extremity Assessment Upper Extremity Assessment: RUE deficits/detail;LUE deficits/detail RUE Deficits / Details: WFL ROM and strength LUE Deficits / Details: Shoulder ROM not tested limited by shoulder precautions; exhibits WFL ROM fingers and wrist, partial ROM of forearm - limited by effects of block.           Communication Communication Communication: No difficulties  Cognition Arousal/Alertness: Awake/alert Behavior During Therapy: WFL for tasks assessed/performed Overall Cognitive Status: Within Functional Limits for tasks assessed                                     General Comments       Exercises     Shoulder  Instructions Shoulder Instructions Donning/doffing shirt without moving shoulder: Maximal assistance;Patient able to independently direct caregiver Method for sponge bathing under operated UE: Minimal assistance;Patient able to independently direct caregiver Donning/doffing sling/immobilizer: Moderate assistance;Patient able to independently direct caregiver Correct positioning of sling/immobilizer: Patient able to independently direct caregiver ROM for elbow, wrist and digits of operated UE: Patient able to independently direct caregiver Sling wearing schedule (on at all times/off for ADL's): Patient able to independently direct caregiver Proper positioning of operated UE when showering: Patient able to independently direct caregiver Dressing change: Patient able to independently direct caregiver Positioning of UE while sleeping: Patient able to independently direct caregiver    Home Living Family/patient expects to be discharged to:: Private residence Living Arrangements: Spouse/significant other;Children Available Help at Discharge: Family;Available 24 hours/day Type of Home: House             Bathroom Shower/Tub: Walk-in Corporate treasurer Toilet: Standard     Home Equipment: Shower seat   Additional Comments: Can get shower seat from family member if needed.      Prior Functioning/Environment Level of Independence: Independent                 OT Problem List: Impaired UE functional use      OT Treatment/Interventions:      OT Goals(Current goals can be found in the care plan section) Acute Rehab OT Goals Patient Stated Goal: to go home today OT Goal Formulation: With patient Time For Goal Achievement: 09/28/19 Potential to Achieve Goals: Good  OT Frequency:     Barriers to D/C:            Co-evaluation              AM-PAC OT "6 Clicks" Daily Activity     Outcome Measure Help from another person eating meals?: None Help from another person  taking care of personal grooming?: None Help from another person toileting, which includes using toliet, bedpan, or urinal?: A Little Help from another person bathing (including washing, rinsing, drying)?: A Little Help from another person to put on and taking off regular upper body clothing?: A Lot Help from another person to put on and taking off regular lower body clothing?: A Little 6 Click Score: 19   End of Session Nurse Communication: Mobility status (OT education complete)  Activity Tolerance: Patient tolerated treatment well Patient left: in chair;with call bell/phone within reach;with nursing/sitter in room  OT Visit Diagnosis: Muscle weakness (generalized) (M62.81);Pain Pain - Right/Left: Left Pain - part of body: Shoulder                Time: 0911-0950 OT Time Calculation (min): 39 min Charges:  OT General Charges $OT Visit: 1 Visit OT Evaluation $OT Eval Low Complexity: 1 Low OT Treatments $Self Care/Home Management : 23-37 mins  Najee Cowens, OTR/L [place]  Office [phone] Pager: [place] 09/28/2019, 10:51 AM

## 2019-09-30 ENCOUNTER — Encounter (HOSPITAL_COMMUNITY): Payer: Self-pay | Admitting: Orthopedic Surgery

## 2019-10-02 NOTE — Discharge Summary (Signed)
Orthopedic Discharge Summary        Physician Discharge Summary  Patient ID: [name] [name] MRN: [id number] DOB/AGE: 05/09/1955 64 y.o.  Admit date: 09/27/2019 Discharge date: 09/28/19  Procedures:  Procedure(s) (LRB): REVERSE SHOULDER ARTHROPLASTY (Left) REMOVAL FOREIGN BOD RIGHT FOOT (Right)  Attending Physician:  Dr. Esmond Plants  Admission Diagnoses:   Left shoulder cuff arthropathy, foreign body right foot  Discharge Diagnoses:  Left shoulder cuff arthropathy, foreign body right foot   Past Medical History:  Diagnosis Date  . Allergy   . Anemia    takes iron supplement  . Anxiety   . Arthritis    feet, shoulders,knees,   . 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 Elevated alkaline phosphatase level    negative workup (presumed secondary to fatty liver)  . Frequency-urgency syndrome   . GERD (gastroesophageal reflux disease)    none since weight loss surgery  . Interstitial cystitis    Dr Vernie Shanks  . Lymphadenopathy of head and neck 11/09/2018  . Neuromuscular disorder (HCC)    rt arm carpal tunnel syndrome  . Obesity    status post bariatric procedure in [facility] 2005  . Osteoporosis   . Sarcoidosis of lung ([place])   . Thyroid nodule 7/10   `  . Thyroid nodule   . Varicose vein of leg     PCP: [name], NP   Discharged Condition: good  Hospital Course:  Patient underwent the above stated procedure on 09/27/2019. Patient tolerated the procedure well and brought to the recovery room in good condition and subsequently to the floor. Patient had an uncomplicated hospital course and was stable for discharge.   Disposition: Discharge disposition: 01-Home or Self Care      with follow up in 2 weeks    Follow-up Information    Netta Cedars, MD. Call in 2 weeks.   Specialty: Orthopedic Surgery Why: [phone] Contact information: [address] [place] [address] [phone]               Discharge Instructions      Call MD / Call 911   Complete by: As directed    If you experience chest pain or shortness of breath, CALL 911 and be transported to the hospital emergency room.  If you develope a fever above 101 F, pus (white drainage) or increased drainage or redness at the wound, or calf pain, call your surgeon's office.   Discharge instructions   Complete by: As directed    Follow Dr.Norris's discharge instructions      Allergies as of 09/28/2019      Reactions   Nitrofurantoin    REACTION: fever      Medication List    TAKE these medications   acetaminophen 650 MG CR tablet Commonly known as: TYLENOL Take 2 tablets (1,300 mg total) by mouth every 8 (eight) hours as needed for pain. Take for mild pain. Do not combine with Percocet. Do not exceed daily recommended limit of Tylenol.   ALPRAZolam 0.5 MG tablet Commonly known as: XANAX TAKE 1 TABLET (0.5 MG TOTAL) BY MOUTH 2 (TWO) TIMES DAILY AS NEEDED FOR ANXIETY. What changed: when to take this   amitriptyline 50 MG tablet Commonly known as: ELAVIL Take 50 mg by mouth at bedtime.   CALTRATE 600+D3 PO Take 1 tablet by mouth in the morning and at bedtime.   diphenhydrAMINE 25 MG tablet Commonly known as: BENADRYL Take 25-50 mg by mouth every 6 (six) hours  as needed (for itching with opoid (pain medication)).   ferrous sulfate 325 (65 FE) MG tablet Take 325 mg by mouth in the morning and at bedtime.   fexofenadine 180 MG tablet Commonly known as: ALLEGRA Take 180 mg by mouth at bedtime.   gabapentin 300 MG capsule Commonly known as: NEURONTIN Take 1 capsule (300 mg total) by mouth 3 (three) times daily as needed. For foot pain What changed:   when to take this  additional instructions   HYDROcodone-acetaminophen 5-325 MG tablet Commonly known as: Norco Take 1 tablet by mouth every 6 (six) hours as needed for moderate pain or severe pain.   ibuprofen 200 MG tablet Commonly known as: ADVIL Take 200-400 mg by mouth every 8  (eight) hours as needed (pain.).   ketotifen 0.025 % ophthalmic solution Commonly known as: ZADITOR Place 1 drop into both eyes 2 (two) times daily as needed (allergy eyes.).   methocarbamol 500 MG tablet Commonly known as: Robaxin Take 1 tablet (500 mg total) by mouth every 8 (eight) hours as needed for muscle spasms.   multivitamin with minerals Tabs tablet Take 1 tablet by mouth at bedtime.   Myrbetriq 50 MG Tb24 tablet Generic drug: mirabegron ER Take 50 mg by mouth at bedtime.   omeprazole 40 MG capsule Commonly known as: PRILOSEC TAKE 1 CAPSULE BY MOUTH EVERY DAY What changed:   how much to take  how to take this  when to take this  additional instructions   ondansetron 4 MG tablet Commonly known as: Zofran Take 1 tablet (4 mg total) by mouth every 8 (eight) hours as needed for nausea, vomiting or refractory nausea / vomiting.   PARoxetine 40 MG tablet Commonly known as: PAXIL TAKE 1 TABLET BY MOUTH EVERY DAY IN THE MORNING What changed: See the new instructions.   sodium chloride 0.65 % Soln nasal spray Commonly known as: OCEAN Place 1-2 sprays into both nostrils 4 (four) times daily as needed for congestion.   valACYclovir 1000 MG tablet Commonly known as: VALTREX Take 0.5 tablets (500 mg total) by mouth daily. What changed:   when to take this  reasons to take this   vitamin C 1000 MG tablet Take 1,000 mg by mouth at bedtime.   Vitamin D3 50 MCG (2000 UT) Tabs Take 4,000 Units by mouth at bedtime. What changed: Another medication with the same name was removed. Continue taking this medication, and follow the directions you see here.   Xiidra 5 % Soln Generic drug: Lifitegrast Place 1 drop into both eyes 2 (two) times daily.   zinc gluconate 50 MG tablet Take 50 mg by mouth at bedtime.         Signed: Ventura Bruns 10/02/2019, 8:47 AM  [facility] is now [employer] [address], [place],  [place], [place] [address] Phone: [place]

## 2019-10-03 ENCOUNTER — Other Ambulatory Visit: Payer: Self-pay | Admitting: Family

## 2019-10-04 ENCOUNTER — Encounter: Payer: Self-pay | Admitting: Family

## 2019-10-10 DIAGNOSIS — Z4789 Encounter for other orthopedic aftercare: Secondary | ICD-10-CM | POA: Diagnosis not present

## 2019-10-18 DIAGNOSIS — B9620 Unspecified Escherichia coli [E. coli] as the cause of diseases classified elsewhere: Secondary | ICD-10-CM | POA: Diagnosis not present

## 2019-10-18 DIAGNOSIS — N390 Urinary tract infection, site not specified: Secondary | ICD-10-CM | POA: Diagnosis not present

## 2019-10-18 DIAGNOSIS — N3000 Acute cystitis without hematuria: Secondary | ICD-10-CM | POA: Diagnosis not present

## 2019-10-19 ENCOUNTER — Other Ambulatory Visit: Payer: Self-pay | Admitting: Family

## 2019-11-01 ENCOUNTER — Encounter: Payer: Self-pay | Admitting: Family

## 2019-11-01 ENCOUNTER — Other Ambulatory Visit: Payer: Self-pay | Admitting: Family

## 2019-11-07 DIAGNOSIS — Z4789 Encounter for other orthopedic aftercare: Secondary | ICD-10-CM | POA: Diagnosis not present

## 2019-11-19 ENCOUNTER — Encounter: Payer: Self-pay | Admitting: Family

## 2019-11-19 MED ORDER — ALPRAZOLAM 0.5 MG PO TABS: 0.5000 mg | ORAL_TABLET | Freq: Two times a day (BID) | ORAL | 0 refills | Status: DC | PRN

## 2019-11-27 ENCOUNTER — Encounter: Payer: Self-pay | Admitting: Family

## 2019-11-27 ENCOUNTER — Other Ambulatory Visit: Payer: Self-pay

## 2019-11-27 MED ORDER — VALACYCLOVIR HCL 1 G PO TABS: 500.0000 mg | ORAL_TABLET | Freq: Every day | ORAL | 1 refills | Status: DC

## 2019-12-04 DIAGNOSIS — M2550 Pain in unspecified joint: Secondary | ICD-10-CM | POA: Diagnosis not present

## 2019-12-04 DIAGNOSIS — D869 Sarcoidosis, unspecified: Secondary | ICD-10-CM | POA: Diagnosis not present

## 2019-12-04 DIAGNOSIS — M8580 Other specified disorders of bone density and structure, unspecified site: Secondary | ICD-10-CM | POA: Diagnosis not present

## 2019-12-05 DIAGNOSIS — B9620 Unspecified Escherichia coli [E. coli] as the cause of diseases classified elsewhere: Secondary | ICD-10-CM | POA: Diagnosis not present

## 2019-12-05 DIAGNOSIS — N3000 Acute cystitis without hematuria: Secondary | ICD-10-CM | POA: Diagnosis not present

## 2019-12-05 DIAGNOSIS — N390 Urinary tract infection, site not specified: Secondary | ICD-10-CM | POA: Diagnosis not present

## 2019-12-09 DIAGNOSIS — M79671 Pain in right foot: Secondary | ICD-10-CM | POA: Diagnosis not present

## 2019-12-09 DIAGNOSIS — S93491A Sprain of other ligament of right ankle, initial encounter: Secondary | ICD-10-CM | POA: Diagnosis not present

## 2019-12-09 DIAGNOSIS — M25511 Pain in right shoulder: Secondary | ICD-10-CM | POA: Diagnosis not present

## 2019-12-09 DIAGNOSIS — M5030 Other cervical disc degeneration, unspecified cervical region: Secondary | ICD-10-CM | POA: Diagnosis not present

## 2019-12-10 DIAGNOSIS — S93491A Sprain of other ligament of right ankle, initial encounter: Secondary | ICD-10-CM | POA: Diagnosis not present

## 2019-12-16 DIAGNOSIS — N390 Urinary tract infection, site not specified: Secondary | ICD-10-CM | POA: Diagnosis not present

## 2019-12-16 DIAGNOSIS — B9620 Unspecified Escherichia coli [E. coli] as the cause of diseases classified elsewhere: Secondary | ICD-10-CM | POA: Diagnosis not present

## 2019-12-24 DIAGNOSIS — Z4789 Encounter for other orthopedic aftercare: Secondary | ICD-10-CM | POA: Diagnosis not present

## 2019-12-27 ENCOUNTER — Other Ambulatory Visit: Payer: Self-pay | Admitting: Family

## 2019-12-27 DIAGNOSIS — B951 Streptococcus, group B, as the cause of diseases classified elsewhere: Secondary | ICD-10-CM | POA: Diagnosis not present

## 2019-12-27 DIAGNOSIS — N390 Urinary tract infection, site not specified: Secondary | ICD-10-CM | POA: Diagnosis not present

## 2020-01-02 ENCOUNTER — Encounter: Payer: Self-pay | Admitting: Family

## 2020-01-08 ENCOUNTER — Encounter: Payer: Self-pay | Admitting: Family

## 2020-01-08 MED ORDER — ALPRAZOLAM 0.5 MG PO TABS: 0.5000 mg | ORAL_TABLET | Freq: Two times a day (BID) | ORAL | 0 refills | Status: DC | PRN

## 2020-01-13 ENCOUNTER — Encounter: Payer: Self-pay | Admitting: Family Medicine

## 2020-01-13 ENCOUNTER — Other Ambulatory Visit: Payer: Self-pay

## 2020-01-13 ENCOUNTER — Telehealth (INDEPENDENT_AMBULATORY_CARE_PROVIDER_SITE_OTHER): Payer: BC Managed Care – PPO | Admitting: Family Medicine

## 2020-01-13 DIAGNOSIS — J0100 Acute maxillary sinusitis, unspecified: Secondary | ICD-10-CM | POA: Diagnosis not present

## 2020-01-13 MED ORDER — AMOXICILLIN-POT CLAVULANATE 875-125 MG PO TABS: 1.0000 | ORAL_TABLET | Freq: Two times a day (BID) | ORAL | 0 refills | Status: AC

## 2020-01-13 MED ORDER — METHYLPREDNISOLONE 4 MG PO TBPK: ORAL_TABLET | ORAL | 0 refills | Status: DC

## 2020-01-13 MED ORDER — FLUCONAZOLE 150 MG PO TABS: ORAL_TABLET | ORAL | 0 refills | Status: DC

## 2020-01-13 NOTE — Progress Notes (Signed)
Chief Complaint  Patient presents with  . Headache    sneezing  . Nasal Congestion  . Ear Pain    [name] [name] here for URI complaints. Due to COVID-19 pandemic, we are interacting via web portal for an electronic face-to-face visit. I verified patient's ID using 2 identifiers. Patient agreed to proceed with visit via this method. Patient is at home, I am at office. Patient and I are present for visit.   Duration: 4 days  Associated symptoms: sinus congestion, sinus pain, rhinorrhea and itchy watery eyes Denies: ear pain, ear drainage, sore throat, wheezing, shortness of breath, myalgia and fevers, cough Treatment to date: Sudafed Sick contacts: No  Past Medical History:  Diagnosis Date  . Allergy   . Anemia    takes iron supplement  . Anxiety   . Arthritis    feet, shoulders,knees,   . 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 Elevated alkaline phosphatase level    negative workup (presumed secondary to fatty liver)  . Frequency-urgency syndrome   . GERD (gastroesophageal reflux disease)    none since weight loss surgery  . Interstitial cystitis    Dr Vernie Shanks  . Lymphadenopathy of head and neck 11/09/2018  . Neuromuscular disorder (HCC)    rt arm carpal tunnel syndrome  . Obesity    status post bariatric procedure in [facility] 2005  . Osteoporosis   . Sarcoidosis of lung ([place])   . Thyroid nodule 7/10   `  . Thyroid nodule   . Varicose vein of leg    Exam No conversational dyspnea Age appropriate judgment and insight Nml affect and mood  Acute maxillary sinusitis, recurrence not specified - Plan: methylPREDNISolone (MEDROL DOSEPAK) 4 MG TBPK tablet, amoxicillin-clavulanate (AUGMENTIN) 875-125 MG tablet, fluconazole (DIFLUCAN) 150 MG tablet  Probably allergy mediated. Will do a Medrol Dosepak, Augmentin in 2 d if no better.  Continue to push fluids, practice good hand hygiene, cover mouth when coughing. F/u prn. If starting to experience fevers, shaking, or shortness of  breath, seek immediate care. Pt voiced understanding and agreement to the plan.  [name], DO 01/13/20 3:40 PM

## 2020-02-03 ENCOUNTER — Encounter: Payer: Self-pay | Admitting: Family

## 2020-02-04 ENCOUNTER — Telehealth (INDEPENDENT_AMBULATORY_CARE_PROVIDER_SITE_OTHER): Payer: BC Managed Care – PPO | Admitting: Family

## 2020-02-04 ENCOUNTER — Other Ambulatory Visit: Payer: Self-pay | Admitting: Family

## 2020-02-04 ENCOUNTER — Other Ambulatory Visit: Payer: Self-pay

## 2020-02-04 DIAGNOSIS — J0100 Acute maxillary sinusitis, unspecified: Secondary | ICD-10-CM | POA: Diagnosis not present

## 2020-02-04 MED ORDER — FLUCONAZOLE 150 MG PO TABS: ORAL_TABLET | ORAL | 0 refills | Status: DC

## 2020-02-04 MED ORDER — AMOXICILLIN-POT CLAVULANATE 875-125 MG PO TABS
1.0000 | ORAL_TABLET | Freq: Two times a day (BID) | ORAL | 0 refills | Status: DC
Start: 2020-02-04 — End: 2020-03-18

## 2020-02-04 NOTE — Progress Notes (Signed)
Virtual Visit via Video Note  I connected with [name] [name] on 02/04/20 at 10:00 AM EST by a video enabled telemedicine application and verified that I am speaking with the correct person using two identifiers.  Location: Patient: home Provider: work   I discussed the limitations of evaluation and management by telemedicine and the availability of in person appointments. The patient expressed understanding and agreed to proceed. Only the patient and myself were present for today's video call.   History of Present Illness:  4 days ago she developed voice hoarseness, post nasal drip. she started taking some prednisone yesterday which helped with her voice.  Temp 98.5. denies fever.  Feels "not up to par-kind of lousy."  Has some pain in her right ear.   She is taking allegra daily, omeprazole for gerd.  She has added sudafed bid for congestion which is helping some.  Took dayquil last night.    Has prednisone 10mg  tabs on hand. Took 3 tabs yesterday with plan for 20mg  today, 15mg  tomorrow, then 10mg  then 5 mg then stop. Feels like this helped her sinus pain and voice hoarseness.   Observations/Objective:   Gen: Awake, alert, no acute distress Resp: Breathing is even and non-labored ENT: + bilateral maxillary sinus tenderness to palpation, no significant frontal sinus tenderness Psych: calm/pleasant demeanor Neuro: Alert and Oriented x 3, + facial symmetry, speech is clear.   Assessment and Plan:  Sinusitis- recommended rx with augmentin. She is prone to vaginal yeast infections with augmentin so I also sent diflucan to have on hand. Advised OK to complete her planned prednisone taper.  Recommended that she obtain an otc rapid covid test and complete at home. She will let me know if she tests positive for covid.    Follow Up Instructions:    I discussed the assessment and treatment plan with the patient. The patient was provided an opportunity to ask questions and all were  answered. The patient agreed with the plan and demonstrated an understanding of the instructions.   The patient was advised to call back or seek an in-person evaluation if the symptoms worsen or if the condition fails to improve as anticipated.  Nance Pear, NP

## 2020-02-07 ENCOUNTER — Encounter: Payer: Self-pay | Admitting: Family

## 2020-02-07 ENCOUNTER — Other Ambulatory Visit: Payer: Self-pay | Admitting: Hematology

## 2020-02-10 ENCOUNTER — Encounter: Payer: Self-pay | Admitting: Family

## 2020-02-10 MED ORDER — OMEPRAZOLE 40 MG PO CPDR: DELAYED_RELEASE_CAPSULE | ORAL | 1 refills | Status: DC

## 2020-02-10 MED ORDER — ALPRAZOLAM 0.5 MG PO TABS: 0.5000 mg | ORAL_TABLET | Freq: Two times a day (BID) | ORAL | 0 refills | Status: DC | PRN

## 2020-02-11 ENCOUNTER — Encounter: Payer: Self-pay | Admitting: Family

## 2020-02-21 ENCOUNTER — Other Ambulatory Visit: Payer: Self-pay

## 2020-02-26 ENCOUNTER — Encounter: Payer: Self-pay | Admitting: Family

## 2020-02-26 DIAGNOSIS — D869 Sarcoidosis, unspecified: Secondary | ICD-10-CM

## 2020-03-09 ENCOUNTER — Encounter: Payer: Self-pay | Admitting: Family

## 2020-03-09 NOTE — Telephone Encounter (Signed)
Requesting: alprazolam 0.5mg  Contract: 09/04/2017 UDS: 09/08/2017 Last Visit: 02/04/2020 Next Visit: None Last Refill: 02/10/2020 #60 and 0RF  Please Advise

## 2020-03-10 ENCOUNTER — Encounter: Payer: Self-pay | Admitting: Family

## 2020-03-10 MED ORDER — ALPRAZOLAM 0.5 MG PO TABS: 0.5000 mg | ORAL_TABLET | Freq: Two times a day (BID) | ORAL | 0 refills | Status: DC | PRN

## 2020-03-16 ENCOUNTER — Other Ambulatory Visit (INDEPENDENT_AMBULATORY_CARE_PROVIDER_SITE_OTHER): Payer: 59

## 2020-03-16 ENCOUNTER — Other Ambulatory Visit: Payer: Self-pay

## 2020-03-16 ENCOUNTER — Telehealth: Payer: Self-pay | Admitting: Family

## 2020-03-16 ENCOUNTER — Ambulatory Visit: Payer: 59 | Admitting: Family

## 2020-03-16 DIAGNOSIS — R682 Dry mouth, unspecified: Secondary | ICD-10-CM | POA: Diagnosis not present

## 2020-03-16 NOTE — Telephone Encounter (Signed)
Pt came late for her visit. Visit will be rescheduled, but will have her complete lab work prior to leaving.   Please reschedule OV.

## 2020-03-16 NOTE — Telephone Encounter (Signed)
Patient was rescheduled for 03-18-20, labs collected today

## 2020-03-17 LAB — SJOGRENS SYNDROME-A EXTRACTABLE NUCLEAR ANTIBODY: SSA (Ro) (ENA) Antibody, IgG: <1 AI

## 2020-03-17 LAB — SJOGRENS SYNDROME-B EXTRACTABLE NUCLEAR ANTIBODY: SSB (La) (ENA) Antibody, IgG: <1 AI

## 2020-03-18 ENCOUNTER — Encounter: Payer: Self-pay | Admitting: Family

## 2020-03-18 ENCOUNTER — Ambulatory Visit: Payer: 59 | Admitting: Family

## 2020-03-18 ENCOUNTER — Other Ambulatory Visit: Payer: Self-pay

## 2020-03-18 VITALS — BP 146/71 | HR 79 | Temp 98.9°F | Resp 16 | Ht 64.0 in | Wt 233.4 lb

## 2020-03-18 DIAGNOSIS — R682 Dry mouth, unspecified: Secondary | ICD-10-CM

## 2020-03-18 DIAGNOSIS — J322 Chronic ethmoidal sinusitis: Secondary | ICD-10-CM

## 2020-03-18 MED ORDER — AMOXICILLIN-POT CLAVULANATE 875-125 MG PO TABS
1.0000 | ORAL_TABLET | Freq: Two times a day (BID) | ORAL | 0 refills | Status: DC
Start: 2020-03-18 — End: 2020-04-15

## 2020-03-18 MED ORDER — FLUCONAZOLE 150 MG PO TABS
ORAL_TABLET | ORAL | 0 refills | Status: DC
Start: 2020-03-18 — End: 2020-06-04

## 2020-03-18 MED ORDER — AMLODIPINE BESYLATE 5 MG PO TABS
5.0000 mg | ORAL_TABLET | Freq: Every day | ORAL | 0 refills | Status: DC
Start: 2020-03-18 — End: 2020-06-03

## 2020-03-18 NOTE — Patient Instructions (Signed)
Start augmentin for sinus infection. Start amlodipine 5mg  once daily for blood pressure.  Continue paxil.

## 2020-03-18 NOTE — Progress Notes (Signed)
Subjective:    Patient ID: [name] [name], female    DOB: 10-17-1955, 65 y.o.   MRN: [id number]  HPI   Patient is a 65 yr old female who presents today to discuss dry mouth symptoms which her dentist feels is likely contributing to her dental issues.  Dentist told her that she should come off of paxil. She has been on paxil for 20+ years. She is following with [facility].    Using a prescription toothpaste, drinks extra water.  Mouth Kote which she is trying but it tastes bad.    She reports bad allergy symptoms. "I sneezed my head off."  + nasal congestion. Using allegra. Stopped rx nasal sprays as ENT told her it was causing her nose bleeds.  Notes that if she bends over she gets light headed and dizzy for a minute.Notes yellow/green sometimes bloody nasal drainage.     Review of Systems     Past Medical History:  Diagnosis Date  . Allergy   . Anemia    takes iron supplement  . Anxiety   . Arthritis    feet, shoulders,knees,   . 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 Elevated alkaline phosphatase level    negative workup (presumed secondary to fatty liver)  . Frequency-urgency syndrome   . GERD (gastroesophageal reflux disease)    none since weight loss surgery  . Interstitial cystitis    Dr Vernie Shanks  . Lymphadenopathy of head and neck 11/09/2018  . Neuromuscular disorder (HCC)    rt arm carpal tunnel syndrome  . Obesity    status post bariatric procedure in [facility] 2005  . Osteoporosis   . Sarcoidosis of lung ([place])   . Thyroid nodule 7/10   `  . Thyroid nodule   . Varicose vein of leg      Social History   Socioeconomic History  . Marital status: Significant Other    Spouse name: Not on file  . Number of children: 1  . Years of education: Not on file  . Highest education level: Not on file  Occupational History  . 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[employer]  Tobacco Use  . Smoking status: Never Smoker  . Smokeless tobacco: Never Used   Vaping Use  . Vaping Use: Never used  Substance and Sexual Activity  . Alcohol use: Yes    Comment: RARE  . Drug use: No  . Sexual activity: Yes    Comment: 1st intercourse 65 yo-More than 5 partners  Other Topics Concern  . Not on file  Social History Narrative  . Not on file   Social Determinants of Health   Financial Resource Strain: Not on file  Food Insecurity: Not on file  Transportation Needs: Not on file  Physical Activity: Not on file  Stress: Not on file  Social Connections: Not on file  Intimate Partner Violence: Not on file    Past Surgical History:  Procedure Laterality Date  . ANTERIOR CERVICAL DECOMP/DISCECTOMY FUSION N/A 06/21/2012   Procedure: ANTERIOR CERVICAL DECOMPRESSION/DISCECTOMY FUSION C-4 - C5 (SPACER/DePUY CERVICAL PLATE ONLY) 1 LEVEL;  Surgeon: Melina Schools, MD;  Location: [place];  Service: Orthopedics;  Laterality: N/A;  . BLADDER SURGERY  02/21/12 and 02/28/12   Dr Diona Fanti  . COLPOSCOPY  1980  . CRYOTHERAPY  1980  . CYSTO WITH HYDRODISTENSION  12/12/2011   Procedure: CYSTOSCOPY/HYDRODISTENSION;  Surgeon: Franchot Gallo, MD;  Location: [facility];  Service: Urology;  Laterality:  N/A;  30 MIN   . EYE SURGERY  1997   bilateral cataract extraction  . FOOT SURGERY     bilateral bunionettes  . FOOT SURGERY Right 01/07/14   Pt states surgery was due to arthritis. "Has pins in place now"  . FOREIGN BODY REMOVAL Right 09/27/2019   Procedure: REMOVAL FOREIGN BOD RIGHT FOOT;  Surgeon: Netta Cedars, MD;  Location: [facility];  Service: Orthopedics;  Laterality: Right;  . FRACTURE SURGERY Left 2019   wrist  . GASTRIC BYPASS  2005  . JOINT REPLACEMENT  2008   left total knee  . KNEE SURGERY  1997   left knee  . LYMPH NODE DISSECTION Right 11/09/2018   Procedure: EXCISE DEEP RIGHT CERVICAL LYMPH NODE;  Surgeon: Fanny Skates, MD;  Location: [facility];  Service: General;  Laterality: Right;  . NASAL SINUS SURGERY  1027   Dr Erik Obey  .  REFRACTIVE SURGERY  2006   lasik  . REVERSE SHOULDER ARTHROPLASTY Left 09/27/2019   Procedure: REVERSE SHOULDER ARTHROPLASTY;  Surgeon: Netta Cedars, MD;  Location: [facility];  Service: Orthopedics;  Laterality: Left;  2 hrs General with intrascalene block  . SHOULDER SURGERY  2 2012   RIGHT   . STOMACH SURGERY  2007   "tummy tuck"  . TOTAL KNEE ARTHROPLASTY Right 12/31/2015   Procedure: RIGHT TOTAL KNEE ARTHROPLASTY;  Surgeon: [name], MD;  Location: [facility];  Service: Orthopedics;  Laterality: Right;  Adductor Block  . VARICOSE VEIN SURGERY Right 02/2012   leg    Family History  Problem Relation Age of Onset  . Diabetes Paternal Grandfather   . Stroke Paternal Grandfather   . Hyperlipidemia Mother   . Hypertension Mother   . Hypertension Father   . Diabetes Father   . Prostate cancer Father   . Dementia Father   . Lung cancer Father   . Heart disease Maternal Grandmother   . Hypertension Maternal Grandmother   . Heart disease Maternal Grandfather   . Hypertension Maternal Grandfather   . Stroke Maternal Grandfather   . Arthritis Brother   . Testicular cancer Brother   . Colon cancer Neg Hx   . Esophageal cancer Neg Hx   . Pancreatic cancer Neg Hx   . Stomach cancer Neg Hx     Allergies  Allergen Reactions  . Nitrofurantoin     REACTION: fever    Current Outpatient Medications on File Prior to Visit  Medication Sig Dispense Refill  . acetaminophen (TYLENOL) 650 MG CR tablet Take 2 tablets (1,300 mg total) by mouth every 8 (eight) hours as needed for pain. Take for mild pain. Do not combine with Percocet. Do not exceed daily recommended limit of Tylenol.    . ALPRAZolam (XANAX) 0.5 MG tablet Take 1 tablet (0.5 mg total) by mouth 2 (two) times daily as needed for anxiety. 60 tablet 0  . amitriptyline (ELAVIL) 50 MG tablet Take 50 mg by mouth at bedtime.   3  . Ascorbic Acid (VITAMIN C) 1000 MG tablet Take 1,000 mg by mouth at bedtime.    . Calcium  Carb-Cholecalciferol (CALTRATE 600+D3 PO) Take 1 tablet by mouth in the morning and at bedtime.    . celecoxib (CELEBREX) 200 MG capsule Take 200 mg by mouth daily.    . Cholecalciferol (VITAMIN D3) 50 MCG (2000 UT) TABS Take 4,000 Units by mouth at bedtime.    . ferrous sulfate 325 (65 FE) MG tablet Take 325 mg by mouth in the  morning and at bedtime.    . fexofenadine (ALLEGRA) 180 MG tablet Take 180 mg by mouth at bedtime.    Marland Kitchen ketotifen (ZADITOR) 0.025 % ophthalmic solution Place 1 drop into both eyes 2 (two) times daily as needed (allergy eyes.).    Marland Kitchen Lifitegrast (XIIDRA) 5 % SOLN Place 1 drop into both eyes 2 (two) times daily.     . Multiple Vitamin (MULTIVITAMIN WITH MINERALS) TABS tablet Take 1 tablet by mouth at bedtime.    Marland Kitchen MYRBETRIQ 50 MG TB24 tablet Take 50 mg by mouth at bedtime.   2  . omeprazole (PRILOSEC) 40 MG capsule TAKE 1 CAPSULE BY MOUTH EVERY DAY 90 capsule 1  . PARoxetine (PAXIL) 40 MG tablet TAKE 1 TABLET BY MOUTH EVERY DAY IN THE MORNING 90 tablet 0  . sodium chloride (OCEAN) 0.65 % SOLN nasal spray Place 1-2 sprays into both nostrils 4 (four) times daily as needed for congestion.    . valACYclovir (VALTREX) 1000 MG tablet Take 0.5 tablets (500 mg total) by mouth daily. 90 tablet 1  . zinc gluconate 50 MG tablet Take 50 mg by mouth at bedtime.      No current facility-administered medications on file prior to visit.    BP (!) 146/71 (BP Location: Right Arm, Patient Position: Sitting, Cuff Size: Large)   Pulse 79   Temp 98.9 F (37.2 C) (Oral)   Resp 16   Ht 5\' 4"  (1.626 m)   Wt 233 lb 6.4 oz (105.9 kg)   SpO2 100%   BMI 40.06 kg/m    Objective:   Physical Exam Constitutional:      Appearance: She is well-developed and well-nourished.  HENT:     Head: Normocephalic and atraumatic.     Right Ear: Tympanic membrane and ear canal normal.     Left Ear: Tympanic membrane and ear canal normal.     Nose:     Comments: Bilateral ethmoid sinus tenderness Neck:      Thyroid: No thyromegaly.  Cardiovascular:     Rate and Rhythm: Normal rate and regular rhythm.     Heart sounds: Normal heart sounds. No murmur heard.   Pulmonary:     Effort: Pulmonary effort is normal. No respiratory distress.     Breath sounds: Normal breath sounds. No wheezing.  Musculoskeletal:     Cervical back: Neck supple.  Skin:    General: Skin is warm and dry.  Neurological:     Mental Status: She is alert and oriented to person, place, and time.  Psychiatric:        Mood and Affect: Mood and affect normal.        Behavior: Behavior normal.        Thought Content: Thought content normal.        Judgment: Judgment normal.           Assessment & Plan:  Dry mouth-the majority of her dental issues occurred after a year-long treatment with prednisone.  She has been on Paxil for greater than 20 years and has severe anxiety when she is not taking it.  I do not feel strongly that the Paxil is the primary cause for her dental issues and I feel that the benefit outweighs the risk in continuing this medication.  Discussed with the patient and she is in agreement with plan.  Her Sjogren's syndrome testing was negative. Wt Readings from Last 3 Encounters:  03/18/20 233 lb 6.4 oz (105.9 kg)  09/27/19 255 lb (  115.7 kg)  09/17/19 255 lb (115.7 kg)   BP Readings from Last 3 Encounters:  03/18/20 (!) 146/71  09/28/19 (!) 155/78  09/17/19 (!) 152/70   Sinusitis-we will treat with Augmentin 875 mg p.o. twice daily.  I also gave her a prescription for Diflucan to be taken as needed for possible yeast infection.  This visit occurred during the SARS-CoV-2 public health emergency.  Safety protocols were in place, including screening questions prior to the visit, additional usage of staff PPE, and extensive cleaning of exam room while observing appropriate contact time as indicated for disinfecting solutions.

## 2020-03-21 ENCOUNTER — Encounter: Payer: Self-pay | Admitting: Family

## 2020-03-22 MED ORDER — OMEPRAZOLE 40 MG PO CPDR: DELAYED_RELEASE_CAPSULE | ORAL | 1 refills | Status: DC

## 2020-03-22 MED ORDER — PAROXETINE HCL 40 MG PO TABS: ORAL_TABLET | ORAL | 1 refills | Status: DC

## 2020-03-31 ENCOUNTER — Telehealth: Payer: Self-pay | Admitting: Family

## 2020-03-31 NOTE — Telephone Encounter (Signed)
Please see rheumatology records and fax to [facility].

## 2020-03-31 NOTE — Telephone Encounter (Signed)
Records faxed to [facility]

## 2020-04-13 ENCOUNTER — Encounter: Payer: Self-pay | Admitting: Family

## 2020-04-14 MED ORDER — ALPRAZOLAM 0.5 MG PO TABS: 0.5000 mg | ORAL_TABLET | Freq: Two times a day (BID) | ORAL | 0 refills | Status: DC | PRN

## 2020-04-14 NOTE — Telephone Encounter (Signed)
Requesting: alprazolam 0.5mg  Contract: None seen UDS: 09/08/2017 Last Visit: 03/18/20 Next Visit: 04/15/20 Last Refill: 03/10/2020 #60 and 0RF Pt sig: 1 tab bid prn  Please Advise

## 2020-04-15 ENCOUNTER — Ambulatory Visit: Payer: 59 | Admitting: Family

## 2020-04-15 ENCOUNTER — Other Ambulatory Visit: Payer: Self-pay

## 2020-04-15 VITALS — BP 129/71 | HR 87 | Temp 98.8°F | Resp 16 | Ht 64.0 in | Wt 230.0 lb

## 2020-04-15 DIAGNOSIS — K117 Disturbances of salivary secretion: Secondary | ICD-10-CM

## 2020-04-15 DIAGNOSIS — I10 Essential (primary) hypertension: Secondary | ICD-10-CM

## 2020-04-15 MED ORDER — PILOCARPINE HCL 5 MG PO TABS: 5.0000 mg | ORAL_TABLET | Freq: Three times a day (TID) | ORAL | 2 refills | Status: DC

## 2020-04-15 NOTE — Progress Notes (Signed)
Subjective:    Patient ID: [name] [name], female    DOB: [birth year], 65 y.o.   MRN: [id number]  HPI  Patient is a 65 yr old female who presents today for follow up.  HTN- last visit we amlodipine 5 mg once daily.  She reports tolerating this new medication without side effect.  BP Readings from Last 3 Encounters:  04/15/20 129/71  03/18/20 (!) 146/71  09/28/19 (!) 155/78   Dry mouth-she has been using Salagen which has been prescribed by her test.  She reports significant improvement in her dry mouth medication and she wishes to continue.  She asks if we can continue these refills.   Review of Systems See HPI  Past Medical History:  Diagnosis Date  . Allergy   . Anemia    takes iron supplement  . Anxiety   . Arthritis    feet, shoulders,knees,   . 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 Elevated alkaline phosphatase level    negative workup (presumed secondary to fatty liver)  . Frequency-urgency syndrome   . GERD (gastroesophageal reflux disease)    none since weight loss surgery  . Interstitial cystitis    Dr Vernie Shanks  . Lymphadenopathy of head and neck 11/09/2018  . Neuromuscular disorder (HCC)    rt arm carpal tunnel syndrome  . Obesity    status post bariatric procedure in [facility] 2005  . Osteoporosis   . Sarcoidosis of lung ([place])   . Thyroid nodule 7/10   `  . Thyroid nodule   . Varicose vein of leg      Social History   Socioeconomic History  . Marital status: Significant Other    Spouse name: Not on file  . Number of children: 1  . Years of education: Not on file  . Highest education level: Not on file  Occupational History  . 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[employer]  Tobacco Use  . Smoking status: Never Smoker  . Smokeless tobacco: Never Used  Vaping Use  . Vaping Use: Never used  Substance and Sexual Activity  . Alcohol use: Yes    Comment: RARE  . Drug use: No  . Sexual activity: Yes    Comment: 1st intercourse 65  yo-More than 5 partners  Other Topics Concern  . Not on file  Social History Narrative  . Not on file   Social Determinants of Health   Financial Resource Strain: Not on file  Food Insecurity: Not on file  Transportation Needs: Not on file  Physical Activity: Not on file  Stress: Not on file  Social Connections: Not on file  Intimate Partner Violence: Not on file    Past Surgical History:  Procedure Laterality Date  . ANTERIOR CERVICAL DECOMP/DISCECTOMY FUSION N/A 06/21/2012   Procedure: ANTERIOR CERVICAL DECOMPRESSION/DISCECTOMY FUSION C-4 - C5 (SPACER/DePUY CERVICAL PLATE ONLY) 1 LEVEL;  Surgeon: Melina Schools, MD;  Location: [place];  Service: Orthopedics;  Laterality: N/A;  . BLADDER SURGERY  02/21/12 and 02/28/12   Dr Diona Fanti  . COLPOSCOPY  1980  . CRYOTHERAPY  1980  . CYSTO WITH HYDRODISTENSION  12/12/2011   Procedure: CYSTOSCOPY/HYDRODISTENSION;  Surgeon: Franchot Gallo, MD;  Location: [facility];  Service: Urology;  Laterality: N/A;  30 MIN   . EYE SURGERY  1997   bilateral cataract extraction  . FOOT SURGERY     bilateral bunionettes  . FOOT SURGERY Right 01/07/14   Pt states surgery was due to  arthritis. "Has pins in place now"  . FOREIGN BODY REMOVAL Right 09/27/2019   Procedure: REMOVAL FOREIGN BOD RIGHT FOOT;  Surgeon: Netta Cedars, MD;  Location: [facility];  Service: Orthopedics;  Laterality: Right;  . FRACTURE SURGERY Left 2019   wrist  . GASTRIC BYPASS  2005  . JOINT REPLACEMENT  2008   left total knee  . KNEE SURGERY  1997   left knee  . LYMPH NODE DISSECTION Right 11/09/2018   Procedure: EXCISE DEEP RIGHT CERVICAL LYMPH NODE;  Surgeon: Fanny Skates, MD;  Location: [place];  Service: General;  Laterality: Right;  . NASAL SINUS SURGERY  0350   Dr Erik Obey  . REFRACTIVE SURGERY  2006   lasik  . REVERSE SHOULDER ARTHROPLASTY Left 09/27/2019   Procedure: REVERSE SHOULDER ARTHROPLASTY;  Surgeon: Netta Cedars, MD;  Location: [facility];  Service:  Orthopedics;  Laterality: Left;  2 hrs General with intrascalene block  . SHOULDER SURGERY  2 2012   RIGHT   . STOMACH SURGERY  2007   "tummy tuck"  . TOTAL KNEE ARTHROPLASTY Right 12/31/2015   Procedure: RIGHT TOTAL KNEE ARTHROPLASTY;  Surgeon: [name], MD;  Location: [facility];  Service: Orthopedics;  Laterality: Right;  Adductor Block  . VARICOSE VEIN SURGERY Right 02/2012   leg    Family History  Problem Relation Age of Onset  . Diabetes Paternal Grandfather   . Stroke Paternal Grandfather   . Hyperlipidemia Mother   . Hypertension Mother   . Hypertension Father   . Diabetes Father   . Prostate cancer Father   . Dementia Father   . Lung cancer Father   . Heart disease Maternal Grandmother   . Hypertension Maternal Grandmother   . Heart disease Maternal Grandfather   . Hypertension Maternal Grandfather   . Stroke Maternal Grandfather   . Arthritis Brother   . Testicular cancer Brother   . Colon cancer Neg Hx   . Esophageal cancer Neg Hx   . Pancreatic cancer Neg Hx   . Stomach cancer Neg Hx     Allergies  Allergen Reactions  . Nitrofurantoin     REACTION: fever    Current Outpatient Medications on File Prior to Visit  Medication Sig Dispense Refill  . acetaminophen (TYLENOL) 650 MG CR tablet Take 2 tablets (1,300 mg total) by mouth every 8 (eight) hours as needed for pain. Take for mild pain. Do not combine with Percocet. Do not exceed daily recommended limit of Tylenol.    . ALPRAZolam (XANAX) 0.5 MG tablet Take 1 tablet (0.5 mg total) by mouth 2 (two) times daily as needed for anxiety. 60 tablet 0  . amitriptyline (ELAVIL) 50 MG tablet Take 50 mg by mouth at bedtime.   3  . amLODipine (NORVASC) 5 MG tablet Take 1 tablet (5 mg total) by mouth daily. 90 tablet 0  . Ascorbic Acid (VITAMIN C) 1000 MG tablet Take 1,000 mg by mouth at bedtime.    . Calcium Carb-Cholecalciferol (CALTRATE 600+D3 PO) Take 1 tablet by mouth in the morning and at bedtime.    . celecoxib  (CELEBREX) 200 MG capsule Take 200 mg by mouth daily.    . Cholecalciferol (VITAMIN D3) 50 MCG (2000 UT) TABS Take 4,000 Units by mouth at bedtime.    . ferrous sulfate 325 (65 FE) MG tablet Take 325 mg by mouth in the morning and at bedtime.    . fexofenadine (ALLEGRA) 180 MG tablet Take 180 mg by mouth at bedtime.    Marland Kitchen  fluconazole (DIFLUCAN) 150 MG tablet Take 1 tab by mouth at start of yeast infection, may repeat in 3 days if needed 2 tablet 0  . ketotifen (ZADITOR) 0.025 % ophthalmic solution Place 1 drop into both eyes 2 (two) times daily as needed (allergy eyes.).    Marland Kitchen Lifitegrast (XIIDRA) 5 % SOLN Place 1 drop into both eyes 2 (two) times daily.     . Multiple Vitamin (MULTIVITAMIN WITH MINERALS) TABS tablet Take 1 tablet by mouth at bedtime.    Marland Kitchen MYRBETRIQ 50 MG TB24 tablet Take 50 mg by mouth at bedtime.   2  . omeprazole (PRILOSEC) 40 MG capsule TAKE 1 CAPSULE BY MOUTH EVERY DAY 90 capsule 1  . PARoxetine (PAXIL) 40 MG tablet TAKE 1 TABLET BY MOUTH EVERY DAY IN THE MORNING 90 tablet 1  . sodium chloride (OCEAN) 0.65 % SOLN nasal spray Place 1-2 sprays into both nostrils 4 (four) times daily as needed for congestion.    . valACYclovir (VALTREX) 1000 MG tablet Take 0.5 tablets (500 mg total) by mouth daily. 90 tablet 1  . zinc gluconate 50 MG tablet Take 50 mg by mouth at bedtime.      No current facility-administered medications on file prior to visit.    BP 129/71 (BP Location: Right Arm, Patient Position: Sitting, Cuff Size: Large)   Pulse 87   Temp 98.8 F (37.1 C) (Oral)   Resp 16   Ht 5\' 4"  (1.626 m)   Wt 230 lb (104.3 kg)   SpO2 98%   BMI 39.48 kg/m       Objective:   Physical Exam Constitutional:      Appearance: She is well-developed and well-nourished.  Cardiovascular:     Rate and Rhythm: Normal rate and regular rhythm.     Heart sounds: Normal heart sounds. No murmur heard.   Pulmonary:     Effort: Pulmonary effort is normal. No respiratory distress.      Breath sounds: Normal breath sounds. No wheezing.  Psychiatric:        Mood and Affect: Mood and affect normal.        Behavior: Behavior normal.        Thought Content: Thought content normal.        Judgment: Judgment normal.           Assessment & Plan:  Hypertension-blood pressure is stable/improved.  We will continue grams p.o. daily.  Xerostomia-continue Salagen.  Refill sent.  This visit occurred during the SARS-CoV-2 public health emergency.  Safety protocols were in place, including screening questions prior to the visit, additional usage of staff PPE, and extensive cleaning of exam room while observing appropriate contact time as indicated for disinfecting solutions.

## 2020-04-16 ENCOUNTER — Encounter: Payer: Self-pay | Admitting: Family

## 2020-04-18 MED ORDER — PILOCARPINE HCL 5 MG PO TABS: 5.0000 mg | ORAL_TABLET | Freq: Three times a day (TID) | ORAL | 2 refills | Status: DC

## 2020-05-18 ENCOUNTER — Encounter: Payer: Self-pay | Admitting: Family

## 2020-05-19 MED ORDER — ALPRAZOLAM 0.5 MG PO TABS: 0.5000 mg | ORAL_TABLET | Freq: Two times a day (BID) | ORAL | 0 refills | Status: DC | PRN

## 2020-05-19 NOTE — Telephone Encounter (Signed)
Requesting: alprazolam 0.5mg  Contract:09/04/2017 UDS: 09/08/2017 Last Visit: 04/15/20 Next Visit: 10/16/20 Last Refill: 04/14/2020 #60 and 0RF  Please Advise

## 2020-06-02 ENCOUNTER — Encounter: Payer: Self-pay | Admitting: Family

## 2020-06-03 ENCOUNTER — Other Ambulatory Visit: Payer: Self-pay | Admitting: Family

## 2020-06-04 ENCOUNTER — Encounter: Payer: Self-pay | Admitting: Internal Medicine

## 2020-06-04 ENCOUNTER — Telehealth (INDEPENDENT_AMBULATORY_CARE_PROVIDER_SITE_OTHER): Payer: 59 | Admitting: Internal Medicine

## 2020-06-04 ENCOUNTER — Other Ambulatory Visit: Payer: Self-pay

## 2020-06-04 VITALS — Ht 64.0 in | Wt 230.0 lb

## 2020-06-04 DIAGNOSIS — J019 Acute sinusitis, unspecified: Secondary | ICD-10-CM

## 2020-06-04 MED ORDER — AZITHROMYCIN 250 MG PO TABS: ORAL_TABLET | ORAL | 0 refills | Status: DC

## 2020-06-04 NOTE — Progress Notes (Signed)
Subjective:    Patient ID: [name] [name], female    DOB: 1955/06/11, 65 y.o.   MRN: [id number]  DOS:  06/04/2020 Type of visit - description: Virtual Visit via Video Note  I connected with the above patient  by a video enabled telemedicine application and verified that I am speaking with the correct person using two identifiers.   THIS ENCOUNTER IS A VIRTUAL VISIT DUE TO COVID-19 - PATIENT WAS NOT SEEN IN THE OFFICE. PATIENT HAS CONSENTED TO VIRTUAL VISIT / TELEMEDICINE VISIT   Location of patient: home  Location of provider: office  Persons participating in the virtual visit: patient, provider   I discussed the limitations of evaluation and management by telemedicine and the availability of in person appointments. The patient expressed understanding and agreed to proceed.  Acute Symptoms started approximately a week ago: Sinus congestion, mild headache, sinus pressure, slightly lightheaded when she bends over. Nasal discharge initially yellow and now is green. Reports that this is very typical, she starts with allergy symptoms and ends up having sinusitis requires antibiotics. Currently using Sudafed from: OTC eyedrops as recommended by her ophthalmologist and  some Allegra.  No fever chills No myalgias. No nausea or vomiting No chest pain no difficulty breathing.  Occasional cough only with postnasal dripping.   Review of Systems See above   Past Medical History:  Diagnosis Date  . Allergy   . Anemia    takes iron supplement  . Anxiety   . Arthritis    feet, shoulders,knees,   . 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 Elevated alkaline phosphatase level    negative workup (presumed secondary to fatty liver)  . Frequency-urgency syndrome   . GERD (gastroesophageal reflux disease)    none since weight loss surgery  . Interstitial cystitis    Dr Vernie Shanks  . Lymphadenopathy of head and neck 11/09/2018  . Neuromuscular disorder (HCC)    rt arm carpal tunnel syndrome  .  Obesity    status post bariatric procedure in [facility] 2005  . Osteoporosis   . Sarcoidosis of lung ([place])   . Thyroid nodule 7/10   `  . Thyroid nodule   . Varicose vein of leg     Past Surgical History:  Procedure Laterality Date  . ANTERIOR CERVICAL DECOMP/DISCECTOMY FUSION N/A 06/21/2012   Procedure: ANTERIOR CERVICAL DECOMPRESSION/DISCECTOMY FUSION C-4 - C5 (SPACER/DePUY CERVICAL PLATE ONLY) 1 LEVEL;  Surgeon: Melina Schools, MD;  Location: [place];  Service: Orthopedics;  Laterality: N/A;  . BLADDER SURGERY  02/21/12 and 02/28/12   Dr Diona Fanti  . COLPOSCOPY  1980  . CRYOTHERAPY  1980  . CYSTO WITH HYDRODISTENSION  12/12/2011   Procedure: CYSTOSCOPY/HYDRODISTENSION;  Surgeon: Franchot Gallo, MD;  Location: [facility];  Service: Urology;  Laterality: N/A;  30 MIN   . EYE SURGERY  1997   bilateral cataract extraction  . FOOT SURGERY     bilateral bunionettes  . FOOT SURGERY Right 01/07/14   Pt states surgery was due to arthritis. "Has pins in place now"  . FOREIGN BODY REMOVAL Right 09/27/2019   Procedure: REMOVAL FOREIGN BOD RIGHT FOOT;  Surgeon: Netta Cedars, MD;  [facility];  Service: Orthopedics;  Laterality: Right;  . FRACTURE SURGERY Left 2019   wrist  . GASTRIC BYPASS  2005  . JOINT REPLACEMENT  2008   left total knee  . KNEE SURGERY  1997   left knee  . LYMPH NODE DISSECTION Right 11/09/2018  Procedure: EXCISE DEEP RIGHT CERVICAL LYMPH NODE;  Surgeon: Fanny Skates, MD;  Location: [place];  Service: General;  Laterality: Right;  . NASAL SINUS SURGERY  3790   Dr Erik Obey  . REFRACTIVE SURGERY  2006   lasik  . REVERSE SHOULDER ARTHROPLASTY Left 09/27/2019   Procedure: REVERSE SHOULDER ARTHROPLASTY;  Surgeon: Netta Cedars, MD;  [facility];  Service: Orthopedics;  Laterality: Left;  2 hrs General with intrascalene block  . SHOULDER SURGERY  2 2012   RIGHT   . STOMACH SURGERY  2007   "tummy tuck"  . TOTAL KNEE ARTHROPLASTY Right  12/31/2015   Procedure: RIGHT TOTAL KNEE ARTHROPLASTY;  Surgeon: [name], MD;  [facility];  Service: Orthopedics;  Laterality: Right;  Adductor Block  . VARICOSE VEIN SURGERY Right 02/2012   leg    Allergies as of 06/04/2020      Reactions   Nitrofurantoin    REACTION: fever      Medication List       Accurate as of June 04, 2020 11:59 PM. If you have any questions, ask your nurse or doctor.        STOP taking these medications   celecoxib 200 MG capsule Commonly known as: CELEBREX Stopped by: Kathlene November, MD   fluconazole 150 MG tablet Commonly known as: DIFLUCAN Stopped by: Kathlene November, MD     TAKE these medications   acetaminophen 650 MG CR tablet Commonly known as: TYLENOL Take 2 tablets (1,300 mg total) by mouth every 8 (eight) hours as needed for pain. Take for mild pain. Do not combine with Percocet. Do not exceed daily recommended limit of Tylenol.   ALPRAZolam 0.5 MG tablet Commonly known as: XANAX Take 1 tablet (0.5 mg total) by mouth 2 (two) times daily as needed for anxiety.   amitriptyline 50 MG tablet Commonly known as: ELAVIL Take 50 mg by mouth at bedtime.   amLODipine 5 MG tablet Commonly known as: NORVASC Take 1 tablet (5 mg total) by mouth daily.   azithromycin 250 MG tablet Commonly known as: Zithromax Z-Pak 2 tabs a day the first day, then 1 tab a day x 4 days Started by: Kathlene November, MD   CALTRATE 600+D3 PO Take 1 tablet by mouth in the morning and at bedtime.   ferrous sulfate 325 (65 FE) MG tablet Take 325 mg by mouth in the morning and at bedtime.   fexofenadine 180 MG tablet Commonly known as: ALLEGRA Take 180 mg by mouth at bedtime.   ketotifen 0.025 % ophthalmic solution Commonly known as: ZADITOR Place 1 drop into both eyes 2 (two) times daily as needed (allergy eyes.).   multivitamin with minerals Tabs tablet Take 1 tablet by mouth at bedtime.   Myrbetriq 50 MG Tb24 tablet Generic drug: mirabegron ER Take 50 mg by  mouth at bedtime.   omeprazole 40 MG capsule Commonly known as: PRILOSEC TAKE 1 CAPSULE BY MOUTH EVERY DAY   PARoxetine 40 MG tablet Commonly known as: PAXIL TAKE 1 TABLET BY MOUTH EVERY DAY IN THE MORNING   pilocarpine 5 MG tablet Commonly known as: SALAGEN Take 1 tablet (5 mg total) by mouth in the morning, at noon, and at bedtime.   sodium chloride 0.65 % Soln nasal spray Commonly known as: OCEAN Place 1-2 sprays into both nostrils 4 (four) times daily as needed for congestion.   valACYclovir 1000 MG tablet Commonly known as: VALTREX Take 0.5 tablets (500 mg total) by mouth daily.  vitamin C 1000 MG tablet Take 1,000 mg by mouth at bedtime.   Vitamin D3 50 MCG (2000 UT) Tabs Take 4,000 Units by mouth at bedtime.   Xiidra 5 % Soln Generic drug: Lifitegrast Place 1 drop into both eyes 2 (two) times daily.   zinc gluconate 50 MG tablet Take 50 mg by mouth at bedtime.          Objective:   Physical Exam Ht 5\' 4"  (1.626 m)   Wt 230 lb (104.3 kg)   BMI 39.48 kg/m  This is a virtual video visit, the patient looks alert oriented x3, in no distress, speaking in complete sentences, mild hoarseness noted, no cough or wheezing.  No vital signs available    Assessment     65 year old female, PMH includes HTN, GERD, DJD, dry mouth, morbid obesity, presents with:  Sinusitis: Likely allergic sinusitis, possibly infectious.  Doubt viral syndrome. I sent the following instructions to the patient: It was nice to see you today. Continue with Allegra over-the-counter Continue rinsing your nose with Ocean mist or similar medication Start using Flonase daily Start Mucinex daily Okay to take occasional Sudafed, check your blood pressures, if they are over 140/85 please to stop Sudafed. Drink plenty fluids Continue the allergy eyedrops as recommended by your eye doctor Start taking the Z-Pak (antibiotic) only if you are not gradually better in the next few days. Call if you  are not better in the next 10 days, definitely call if your symptoms are severe.   I discussed the assessment and treatment plan with the patient. The patient was provided an opportunity to ask questions and all were answered. The patient agreed with the plan and demonstrated an understanding of the instructions.   The patient was advised to call back or seek an in-person evaluation if the symptoms worsen or if the condition fails to improve as anticipated.

## 2020-06-25 ENCOUNTER — Telehealth: Payer: Self-pay

## 2020-06-25 ENCOUNTER — Encounter: Payer: Self-pay | Admitting: Family

## 2020-06-25 MED ORDER — ALPRAZOLAM 0.5 MG PO TABS: 0.5000 mg | ORAL_TABLET | Freq: Two times a day (BID) | ORAL | 0 refills | Status: DC | PRN

## 2020-06-25 NOTE — Telephone Encounter (Signed)
Message sent to provider for refill request

## 2020-06-25 NOTE — Telephone Encounter (Signed)
Refill request for Xanax Last RX: 05-19-2020 Last OV: 04-15-2020 Next OV: 10-16-2020 UDS:  09-08-2017 CSC:  09-08-2017

## 2020-07-09 ENCOUNTER — Encounter: Payer: Self-pay | Admitting: Family

## 2020-07-10 ENCOUNTER — Other Ambulatory Visit: Payer: Self-pay | Admitting: Family

## 2020-07-24 ENCOUNTER — Ambulatory Visit: Payer: 59 | Admitting: Family Medicine

## 2020-07-24 ENCOUNTER — Other Ambulatory Visit (HOSPITAL_BASED_OUTPATIENT_CLINIC_OR_DEPARTMENT_OTHER): Payer: Self-pay

## 2020-07-24 ENCOUNTER — Other Ambulatory Visit: Payer: Self-pay

## 2020-07-24 VITALS — BP 138/86 | HR 99 | Temp 99.0°F | Resp 20 | Ht 64.0 in | Wt 231.6 lb

## 2020-07-24 DIAGNOSIS — J302 Other seasonal allergic rhinitis: Secondary | ICD-10-CM

## 2020-07-24 DIAGNOSIS — J0140 Acute pansinusitis, unspecified: Secondary | ICD-10-CM | POA: Diagnosis not present

## 2020-07-24 DIAGNOSIS — J320 Chronic maxillary sinusitis: Secondary | ICD-10-CM

## 2020-07-24 HISTORY — DX: Chronic maxillary sinusitis: J32.0

## 2020-07-24 MED ORDER — FLUTICASONE PROPIONATE 50 MCG/ACT NA SUSP
2.0000 | Freq: Every day | NASAL | 2 refills | Status: DC
  Filled 2020-07-24: qty 16, 30d supply, fill #0

## 2020-07-24 MED ORDER — FLUTICASONE PROPIONATE 50 MCG/ACT NA SUSP: 2.0000 | Freq: Every day | NASAL | 2 refills | Status: DC

## 2020-07-24 MED ORDER — AMOXICILLIN-POT CLAVULANATE 875-125 MG PO TABS
1.0000 | ORAL_TABLET | Freq: Two times a day (BID) | ORAL | 0 refills | Status: DC
  Filled 2020-07-24: qty 20, 10d supply, fill #0

## 2020-07-24 MED ORDER — AMOXICILLIN-POT CLAVULANATE 875-125 MG PO TABS: 1.0000 | ORAL_TABLET | Freq: Two times a day (BID) | ORAL | 0 refills | Status: DC

## 2020-07-24 NOTE — Progress Notes (Signed)
Patient ID: [name] [name], female    DOB: 1956/01/26  Age: 65 y.o. MRN: [id number]    Subjective:  Subjective  HPI [name] [name] presents for an office visit today. She complains of fever, congestion, cough, postnasal drips, bilateral ear pain, sinus pressure, HA, and seasonal environmental allergies x 3-4 days. She notes that she suffered from a low grade fever of  99.5 F. She states that she has "very bad" allergies and is using Flonase, eye drops, allegra, and mucinex. She notes that mucinex helps relieved the symptoms greatly. She  reports that she experiences cough when there is postnasal drips present. She denies any chest pain, SOB, abdominal pain, chills, sore throat, dysuria, urinary incontinence, back pain, or N/V/D at this time.   Review of Systems  Constitutional:  Positive for fever. Negative for chills and fatigue.  HENT:  Positive for congestion, ear pain, postnasal drip and sinus pressure. Negative for rhinorrhea, sore throat and tinnitus.   Eyes:  Negative for pain.  Respiratory:  Positive for cough. Negative for shortness of breath and wheezing.   Cardiovascular:  Negative for chest pain.  Gastrointestinal:  Negative for abdominal pain, anal bleeding, constipation, diarrhea, nausea and vomiting.  Genitourinary:  Negative for flank pain.  Musculoskeletal:  Negative for back pain and neck pain.  Skin:  Negative for rash.  Allergic/Immunologic: Positive for environmental allergies (seasonal).  Neurological:  Positive for headaches. Negative for seizures, weakness, light-headedness and numbness.   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   Elevated alkaline phosphatase level    negative workup (presumed secondary to fatty liver)   Frequency-urgency syndrome    GERD (gastroesophageal reflux disease)    none since weight loss surgery    Interstitial cystitis    Dr Vernie Shanks   Lymphadenopathy of head and neck 11/09/2018   Neuromuscular disorder ([place])    rt arm carpal tunnel syndrome   Obesity    status post bariatric procedure in [place] 2005   Osteoporosis    Sarcoidosis of lung (HCC)    Thyroid nodule 7/10   `   Thyroid nodule    Varicose vein of leg     She has a past surgical history that includes Joint replacement (2008); Stomach surgery (2007); Gastric bypass (2005); Eye surgery (1997); Knee surgery (1997); Refractive surgery (2006); Nasal sinus surgery (1989); Cryotherapy (1980); Colposcopy (1980); Shoulder surgery (2 2012); Foot surgery; cysto with hydrodistension (12/12/2011); Bladder surgery (02/21/12 and 02/28/12); Varicose vein surgery (Right, 02/2012); Anterior cervical decomp/discectomy fusion (N/A, 06/21/2012); Foot surgery (Right, 01/07/14); Total knee arthroplasty (Right, 12/31/2015); Lymph node dissection (Right, 11/09/2018); Fracture surgery (Left, 2019); Reverse shoulder arthroplasty (Left, 09/27/2019); and Foreign Body Removal (Right, 09/27/2019).   Her family history includes Arthritis in her brother; Dementia in her father; Diabetes in her father and paternal grandfather; Heart disease in her maternal grandfather and maternal grandmother; Hyperlipidemia in her mother; Hypertension in her father, maternal grandfather, maternal grandmother, and mother; Lung cancer in her father; Prostate cancer in her father; Stroke in her maternal grandfather and paternal grandfather; Testicular cancer in her brother.She reports that she has never smoked. She has never used smokeless tobacco. She reports current alcohol use. She reports that she does not use drugs.  Current Outpatient Medications on File Prior to Visit  Medication Sig Dispense  Refill   acetaminophen (TYLENOL) 650 MG CR tablet Take 2 tablets (1,300 mg total) by mouth every 8 (eight) hours as needed for pain. Take for mild pain. Do not combine with Percocet. Do  not exceed daily recommended limit of Tylenol.     ALPRAZolam (XANAX) 0.5 MG tablet Take 1 tablet (0.5 mg total) by mouth 2 (two) times daily as needed for anxiety. 60 tablet 0   amitriptyline (ELAVIL) 50 MG tablet Take 50 mg by mouth at bedtime.   3   amLODipine (NORVASC) 5 MG tablet Take 1 tablet (5 mg total) by mouth daily. 90 tablet 1   Ascorbic Acid (VITAMIN C) 1000 MG tablet Take 1,000 mg by mouth at bedtime.     Calcium Carb-Cholecalciferol (CALTRATE 600+D3 PO) Take 1 tablet by mouth in the morning and at bedtime.     Cholecalciferol (VITAMIN D3) 50 MCG (2000 UT) TABS Take 4,000 Units by mouth at bedtime.     ferrous sulfate 325 (65 FE) MG tablet Take 325 mg by mouth in the morning and at bedtime.     fexofenadine (ALLEGRA) 180 MG tablet Take 180 mg by mouth at bedtime.     ketotifen (ZADITOR) 0.025 % ophthalmic solution Place 1 drop into both eyes 2 (two) times daily as needed (allergy eyes.).     Lifitegrast (XIIDRA) 5 % SOLN Place 1 drop into both eyes 2 (two) times daily.      Multiple Vitamin (MULTIVITAMIN WITH MINERALS) TABS tablet Take 1 tablet by mouth at bedtime.     MYRBETRIQ 50 MG TB24 tablet Take 50 mg by mouth at bedtime.   2   omeprazole (PRILOSEC) 40 MG capsule TAKE 1 CAPSULE BY MOUTH EVERY DAY 90 capsule 1   PARoxetine (PAXIL) 40 MG tablet TAKE 1 TABLET BY MOUTH EVERY DAY IN THE MORNING 90 tablet 1   pilocarpine (SALAGEN) 5 MG tablet Take 1 tablet (5 mg total) by mouth in the morning, at noon, and at bedtime. 90 tablet 2   sodium chloride (OCEAN) 0.65 % SOLN nasal spray Place 1-2 sprays into both nostrils 4 (four) times daily as needed for congestion.     valACYclovir (VALTREX) 1000 MG tablet Take 0.5 tablets (500 mg total) by mouth daily. 90 tablet 1   zinc gluconate 50 MG tablet Take 50 mg by mouth at bedtime.      No current facility-administered medications on file prior to visit.     Objective:  Objective  Physical Exam Vitals and nursing note reviewed.   Constitutional:      General: She is not in acute distress.    Appearance: Normal appearance. She is well-developed. She is not ill-appearing.  HENT:     Head: Normocephalic and atraumatic.     Right Ear: External ear normal.     Left Ear: External ear normal.     Nose:     Comments: There is bilateral maxillary sinus pressure present.  Eyes:     General:        Right eye: No discharge.        Left eye: No discharge.     Extraocular Movements: Extraocular movements intact.     Pupils: Pupils are equal, round, and reactive to light.  Cardiovascular:     Rate and Rhythm: Normal rate and regular rhythm.     Pulses: Normal pulses.     Heart sounds: Normal heart sounds. No murmur heard.   No friction rub. No gallop.  Pulmonary:  Effort: Pulmonary effort is normal. No respiratory distress.     Breath sounds: Normal breath sounds. No stridor. No wheezing, rhonchi or rales.  Chest:     Chest wall: No tenderness.  Abdominal:     General: Bowel sounds are normal. There is no distension.     Palpations: Abdomen is soft. There is no mass.     Tenderness: no abdominal tenderness There is no guarding or rebound.     Hernia: No hernia is present.  Musculoskeletal:        General: Normal range of motion.     Cervical back: Normal range of motion and neck supple.     Right lower leg: No edema.     Left lower leg: No edema.  Skin:    General: Skin is warm and dry.  Neurological:     Mental Status: She is alert and oriented to person, place, and time.  Psychiatric:        Behavior: Behavior normal.        Thought Content: Thought content normal.   BP 138/86 (BP Location: Right Arm, Patient Position: Sitting, Cuff Size: Normal)   Pulse 99   Temp 99 F (37.2 C) (Oral)   Resp 20   Ht 5\' 4"  (1.626 m)   Wt 231 lb 9.6 oz (105.1 kg)   SpO2 95%   BMI 39.75 kg/m  Wt Readings from Last 3 Encounters:  07/24/20 231 lb 9.6 oz (105.1 kg)  06/04/20 230 lb (104.3 kg)  04/15/20 230 lb (104.3  kg)     Lab Results  Component Value Date   WBC 4.4 09/17/2019   HGB 10.2 (L) 09/28/2019   HCT 33.9 (L) 09/28/2019   PLT 253 09/17/2019   GLUCOSE 178 (H) 09/28/2019   CHOL 169 09/04/2018   TRIG 97.0 09/04/2018   HDL 51.30 09/04/2018   LDLCALC 99 09/04/2018   ALT 16 08/12/2019   AST 19 08/12/2019   NA 136 09/28/2019   K 4.3 09/28/2019   CL 106 09/28/2019   CREATININE 0.50 09/28/2019   BUN 14 09/28/2019   CO2 24 09/28/2019   TSH 1.03 09/04/2018   INR 1.1 (H) 08/12/2019   HGBA1C 5.7 09/18/2017    No results found.   Assessment & Plan:  Plan    Meds ordered this encounter  Medications   DISCONTD: fluticasone (FLONASE) 50 MCG/ACT nasal spray    Sig: Place 2 sprays into both nostrils daily.    Dispense:  16 g    Refill:  2   DISCONTD: amoxicillin-clavulanate (AUGMENTIN) 875-125 MG tablet    Sig: Take 1 tablet by mouth 2 (two) times daily.    Dispense:  20 tablet    Refill:  0   DISCONTD: amoxicillin-clavulanate (AUGMENTIN) 875-125 MG tablet    Sig: Take 1 tablet by mouth 2 (two) times daily.    Dispense:  20 tablet    Refill:  0   amoxicillin-clavulanate (AUGMENTIN) 875-125 MG tablet    Sig: Take 1 tablet by mouth 2 (two) times daily.    Dispense:  20 tablet    Refill:  0   fluticasone (FLONASE) 50 MCG/ACT nasal spray    Sig: Place 2 sprays into both nostrils daily.    Dispense:  16 g    Refill:  2    Problem List Items Addressed This Visit       Unprioritized   Acute non-recurrent pansinusitis - Primary    augmentin bid x 10 days  con't  flonase and allegra Call or rto if no better        Relevant Medications   amoxicillin-clavulanate (AUGMENTIN) 875-125 MG tablet   fluticasone (FLONASE) 50 MCG/ACT nasal spray   Other Visit Diagnoses     Seasonal allergies       Relevant Medications   fluticasone (FLONASE) 50 MCG/ACT nasal spray       Follow-up: Return if symptoms worsen or fail to improve.   I,Gordon Zheng,acting as a Education administrator for [employer], DO.,have documented all relevant documentation on the behalf of Ann Held, DO,as directed by  Ann Held, DO while in the presence of Rich, DO, have reviewed all documentation for this visit. The documentation on 07/24/20 for the exam, diagnosis, procedures, and orders are all accurate and complete.

## 2020-07-24 NOTE — Patient Instructions (Signed)

## 2020-07-24 NOTE — Assessment & Plan Note (Signed)
augmentin bid x 10 days  con't flonase and allegra Call or rto if no better

## 2020-07-27 ENCOUNTER — Encounter: Payer: Self-pay | Admitting: Family

## 2020-07-28 MED ORDER — ALPRAZOLAM 0.5 MG PO TABS: 0.5000 mg | ORAL_TABLET | Freq: Two times a day (BID) | ORAL | 0 refills | Status: DC | PRN

## 2020-08-06 ENCOUNTER — Other Ambulatory Visit: Payer: Self-pay | Admitting: Family

## 2020-08-12 ENCOUNTER — Telehealth: Payer: Self-pay

## 2020-08-12 NOTE — Telephone Encounter (Signed)
Would you like another OV? Please advise

## 2020-08-12 NOTE — Telephone Encounter (Signed)
Pt seen [name] [name] on 06/10 and states sinus infection and wants to know if she can get another round of antibiotics

## 2020-08-13 NOTE — Telephone Encounter (Signed)
Per [name] [name] pt needs to be seen by [facility] since it is over 2 weeks.

## 2020-08-14 ENCOUNTER — Other Ambulatory Visit: Payer: Self-pay

## 2020-08-14 ENCOUNTER — Telehealth (INDEPENDENT_AMBULATORY_CARE_PROVIDER_SITE_OTHER): Payer: 59 | Admitting: Family

## 2020-08-14 DIAGNOSIS — J329 Chronic sinusitis, unspecified: Secondary | ICD-10-CM

## 2020-08-14 MED ORDER — FLUCONAZOLE 150 MG PO TABS: ORAL_TABLET | ORAL | 0 refills | Status: DC

## 2020-08-14 MED ORDER — AMOXICILLIN-POT CLAVULANATE 875-125 MG PO TABS: 1.0000 | ORAL_TABLET | Freq: Two times a day (BID) | ORAL | 0 refills | Status: DC

## 2020-08-14 NOTE — Progress Notes (Signed)
MyChart Video Visit    Virtual Visit via Video Note   This visit type was conducted due to national recommendations for restrictions regarding the COVID-19 Pandemic (e.g. social distancing) in an effort to limit this patient's exposure and mitigate transmission in our community. This patient is at least at moderate risk for complications without adequate follow up. This format is felt to be most appropriate for this patient at this time. Physical exam was limited by quality of the video and audio technology used for the visit. CMA was able to get the patient set up on a video visit.  Patient location: Home. Patient and provider in visit Provider location: Office  I discussed the limitations of evaluation and management by telemedicine and the availability of in person appointments. The patient expressed understanding and agreed to proceed.  Visit Date: 08/14/2020  Today's healthcare provider: Nance Pear, NP     Subjective:    Patient ID: [name] [name], female    DOB: Jul 01, 1955, 65 y.o.   MRN: [id number]  Chief Complaint  Patient presents with   Nasal Congestion    Complains of nasal congestion with post nasal drip    Facial Pain    Complains of sinus pain with headache    HPI  Symptoms began several weeks ago.  She was treated with augmentin x 10 days.  Reports that symptoms improved but after 2 days after she completed the augmentin, her symptoms returned. She notes increasing pressure, nasal pressure, drainage, fatigue.   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   Elevated alkaline phosphatase level    negative workup (presumed secondary to fatty liver)   Frequency-urgency syndrome    GERD (gastroesophageal reflux disease)    none since weight loss surgery   Interstitial cystitis    Dr Vernie Shanks   Lymphadenopathy of head and  neck 11/09/2018   Neuromuscular disorder ([place])    rt arm carpal tunnel syndrome   Obesity    status post bariatric procedure in [place] 2005   Osteoporosis    Sarcoidosis of lung (HCC)    Thyroid nodule 7/10   `   Thyroid nodule    Varicose vein of leg     Past Surgical History:  Procedure Laterality Date   ANTERIOR CERVICAL DECOMP/DISCECTOMY FUSION N/A 06/21/2012   Procedure: ANTERIOR CERVICAL DECOMPRESSION/DISCECTOMY FUSION C-4 - C5 (SPACER/DePUY CERVICAL PLATE ONLY) 1 LEVEL;  Surgeon: Melina Schools, MD;  Location: [facility];  Service: Orthopedics;  Laterality: N/A;   BLADDER SURGERY  02/21/12 and 02/28/12   Dr Diona Fanti   COLPOSCOPY  1980   CRYOTHERAPY  1980   CYSTO WITH HYDRODISTENSION  12/12/2011   Procedure: CYSTOSCOPY/HYDRODISTENSION;  Surgeon: Franchot Gallo, MD;  Location: [facility];  Service: Urology;  Laterality: N/A;  [place]   bilateral cataract extraction   FOOT SURGERY     bilateral bunionettes   FOOT SURGERY Right 01/07/14   Pt states surgery was due to arthritis. "Has pins in place now"   FOREIGN BODY REMOVAL Right 09/27/2019   Procedure: REMOVAL FOREIGN BOD RIGHT FOOT;  Surgeon: Netta Cedars, MD;  Location: [facility];  Service: Orthopedics;  Laterality: Right;   FRACTURE SURGERY Left 2019   wrist   GASTRIC BYPASS  2005  JOINT REPLACEMENT  2008   left total knee   KNEE SURGERY  1997   left knee   LYMPH NODE DISSECTION Right 11/09/2018   Procedure: EXCISE DEEP RIGHT CERVICAL LYMPH NODE;  Surgeon: Fanny Skates, MD;  Location: [facility];  Service: General;  Laterality: Right;   NASAL SINUS SURGERY  7616   Dr [facility]   REFRACTIVE SURGERY  2006   lasik   REVERSE SHOULDER ARTHROPLASTY Left 09/27/2019   Procedure: REVERSE SHOULDER ARTHROPLASTY;  Surgeon: Netta Cedars, MD;  Location: [facility];  Service: Orthopedics;  Laterality: Left;  2 hrs General with intrascalene block   SHOULDER SURGERY  2 2012   RIGHT    STOMACH SURGERY  2007    "tummy tuck"   TOTAL KNEE ARTHROPLASTY Right 12/31/2015   Procedure: RIGHT TOTAL KNEE ARTHROPLASTY;  Surgeon: [name], MD;  Location: [facility];  Service: Orthopedics;  Laterality: Right;  Adductor Block   VARICOSE VEIN SURGERY Right 02/2012   leg    Family History  Problem Relation Age of Onset   Diabetes Paternal Grandfather    Stroke Paternal Grandfather    Hyperlipidemia Mother    Hypertension Mother    Hypertension Father    Diabetes Father    Prostate cancer Father    Dementia Father    Lung cancer Father    Heart disease Maternal Grandmother    Hypertension Maternal Grandmother    Heart disease Maternal Grandfather    Hypertension Maternal Grandfather    Stroke Maternal Grandfather    Arthritis Brother    Testicular cancer Brother    Colon cancer Neg Hx    Esophageal cancer Neg Hx    Pancreatic cancer Neg Hx    Stomach cancer Neg Hx     Social History   Socioeconomic History   Marital status: Significant Other    Spouse name: Not on file   Number of children: 1   Years of education: Not on file   Highest education level: Not on file  Occupational History   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[employer]  Tobacco Use   Smoking status: Never   Smokeless tobacco: Never  Vaping Use   Vaping Use: Never used  Substance and Sexual Activity   Alcohol use: Yes    Comment: RARE   Drug use: No   Sexual activity: Yes    Comment: 1st intercourse 65 yo-More than 5 partners  Other Topics Concern   Not on file  Social History Narrative   Not on file   Social Determinants of Health   Financial Resource Strain: Not on file  Food Insecurity: Not on file  Transportation Needs: Not on file  Physical Activity: Not on file  Stress: Not on file  Social Connections: Not on file  Intimate Partner Violence: Not on file    Outpatient Medications Prior to Visit  Medication Sig Dispense Refill   acetaminophen (TYLENOL) 650 MG CR tablet Take 2 tablets  (1,300 mg total) by mouth every 8 (eight) hours as needed for pain. Take for mild pain. Do not combine with Percocet. Do not exceed daily recommended limit of Tylenol.     ALPRAZolam (XANAX) 0.5 MG tablet Take 1 tablet (0.5 mg total) by mouth 2 (two) times daily as needed for anxiety. 60 tablet 0   amitriptyline (ELAVIL) 50 MG tablet Take 50 mg by mouth at bedtime.   3   amLODipine (NORVASC) 5 MG tablet Take 1 tablet (5 mg total) by  mouth daily. 90 tablet 1   Ascorbic Acid (VITAMIN C) 1000 MG tablet Take 1,000 mg by mouth at bedtime.     Calcium Carb-Cholecalciferol (CALTRATE 600+D3 PO) Take 1 tablet by mouth in the morning and at bedtime.     Cholecalciferol (VITAMIN D3) 50 MCG (2000 UT) TABS Take 4,000 Units by mouth at bedtime.     ferrous sulfate 325 (65 FE) MG tablet Take 325 mg by mouth in the morning and at bedtime.     fexofenadine (ALLEGRA) 180 MG tablet Take 180 mg by mouth at bedtime.     fluticasone (FLONASE) 50 MCG/ACT nasal spray Place 2 sprays into both nostrils daily. 16 g 2   ketotifen (ZADITOR) 0.025 % ophthalmic solution Place 1 drop into both eyes 2 (two) times daily as needed (allergy eyes.).     Lifitegrast (XIIDRA) 5 % SOLN Place 1 drop into both eyes 2 (two) times daily.      Multiple Vitamin (MULTIVITAMIN WITH MINERALS) TABS tablet Take 1 tablet by mouth at bedtime.     MYRBETRIQ 50 MG TB24 tablet Take 50 mg by mouth at bedtime.   2   omeprazole (PRILOSEC) 40 MG capsule TAKE 1 CAPSULE BY MOUTH EVERY DAY 90 capsule 1   PARoxetine (PAXIL) 40 MG tablet TAKE 1 TABLET BY MOUTH EVERY DAY IN THE MORNING 90 tablet 1   pilocarpine (SALAGEN) 5 MG tablet TAKE 1 TABLET BY MOUTH IN THE MORNING, AT NOON AND AT BEDTIME 90 tablet 2   sodium chloride (OCEAN) 0.65 % SOLN nasal spray Place 1-2 sprays into both nostrils 4 (four) times daily as needed for congestion.     valACYclovir (VALTREX) 1000 MG tablet Take 0.5 tablets (500 mg total) by mouth daily. 90 tablet 1   zinc gluconate 50 MG  tablet Take 50 mg by mouth at bedtime.      amoxicillin-clavulanate (AUGMENTIN) 875-125 MG tablet Take 1 tablet by mouth 2 (two) times daily. 20 tablet 0   No facility-administered medications prior to visit.    Allergies  Allergen Reactions   Nitrofurantoin     REACTION: fever    ROS     Objective:    Physical Exam Constitutional:      General: She is not in acute distress.    Appearance: Normal appearance. She is well-developed.  Eyes:     General: No scleral icterus. Pulmonary:     Effort: Pulmonary effort is normal.  Skin:    General: Skin is warm and dry.  Neurological:     Mental Status: She is alert and oriented to person, place, and time.  Psychiatric:        Mood and Affect: Mood normal.        Behavior: Behavior normal.        Thought Content: Thought content normal.        Judgment: Judgment normal.    There were no vitals taken for this visit. Wt Readings from Last 3 Encounters:  07/24/20 231 lb 9.6 oz (105.1 kg)  06/04/20 230 lb (104.3 kg)  04/15/20 230 lb (104.3 kg)       Assessment & Plan:   Problem List Items Addressed This Visit       Unprioritized   Recurrent sinusitis - Primary    Will plan a 14 day course (instead of 10) of augmentin 875mg  bid.  Offered rx for prednisone taper, but she declined.  She will continue allegra, flonase.  Discussed referral back to ENT given recurrent  nature, but her insurance coverage is poor and she wanting to hold off until she gets medicare next march. She is advised to call if new/worsening symptoms or is symptoms fail to improve.        Relevant Medications   amoxicillin-clavulanate (AUGMENTIN) 875-125 MG tablet   fluconazole (DIFLUCAN) 150 MG tablet    I am having [facility] start on fluconazole. I am also having her maintain her amitriptyline, Myrbetriq, acetaminophen, Xiidra, zinc gluconate, multivitamin with minerals, Vitamin D3, vitamin C, ferrous sulfate, fexofenadine, Calcium  Carb-Cholecalciferol (CALTRATE 600+D3 PO), ketotifen, sodium chloride, valACYclovir, omeprazole, PARoxetine, amLODipine, fluticasone, ALPRAZolam, pilocarpine, and amoxicillin-clavulanate.  Meds ordered this encounter  Medications   amoxicillin-clavulanate (AUGMENTIN) 875-125 MG tablet    Sig: Take 1 tablet by mouth 2 (two) times daily.    Dispense:  18 tablet    Refill:  0    Order Specific Question:   Supervising Provider    Answer:   Penni Homans A [[id number]]   fluconazole (DIFLUCAN) 150 MG tablet    Sig: Take 1 tab by mouth as needed for yeast infection, may repeat in 3 days if needed.    Dispense:  2 tablet    Refill:  0    Order Specific Question:   Supervising Provider    Answer:   Penni Homans A [[id number]]    I discussed the assessment and treatment plan with the patient. The patient was provided an opportunity to ask questions and all were answered. The patient agreed with the plan and demonstrated an understanding of the instructions.   The patient was advised to call back or seek an in-person evaluation if the symptoms worsen or if the condition fails to improve as anticipated.  Nance Pear, NP [employer] [facility] [phone] (phone) [phone] (fax)  [place]

## 2020-08-14 NOTE — Assessment & Plan Note (Signed)
Will plan a 14 day course (instead of 10) of augmentin 875mg  bid.  Offered rx for prednisone taper, but she declined.  She will continue allegra, flonase.  Discussed referral back to ENT given recurrent nature, but her insurance coverage is poor and she wanting to hold off until she gets medicare next march. She is advised to call if new/worsening symptoms or is symptoms fail to improve.

## 2020-08-15 ENCOUNTER — Encounter: Payer: Self-pay | Admitting: Family

## 2020-08-18 MED ORDER — AMOXICILLIN-POT CLAVULANATE 875-125 MG PO TABS: 1.0000 | ORAL_TABLET | Freq: Two times a day (BID) | ORAL | 0 refills | Status: DC

## 2020-08-28 ENCOUNTER — Encounter: Payer: Self-pay | Admitting: Family

## 2020-08-28 MED ORDER — ALPRAZOLAM 0.5 MG PO TABS: 0.5000 mg | ORAL_TABLET | Freq: Two times a day (BID) | ORAL | 0 refills | Status: DC | PRN

## 2020-08-28 NOTE — Telephone Encounter (Signed)
Requesting: alprazolam 0.5mg  Contract: 09/04/2017 UDS: 01/16/2017 Last Visit: 08/14/2020 Next Visit: 10/16/2020 Last Refill: 07/28/2020 #60 and 0RF  Please Advise

## 2020-10-11 ENCOUNTER — Other Ambulatory Visit: Payer: Self-pay | Admitting: Family

## 2020-10-13 NOTE — Telephone Encounter (Signed)
Patient reports she is not taking the hydrocodone anymore and she is not able to walk at this time. She will call to schedule appointment as soon as she is better.

## 2020-10-13 NOTE — Telephone Encounter (Signed)
I sent alprazolam refill/paxil refill. However, she is due for follow up. Please schedule her a follow up visit in the next 30 days.  Also, I see that she has received some hydrocodone following her surgery. It is important that she not take hydrocodone and alprazolam together.

## 2020-10-16 ENCOUNTER — Ambulatory Visit: Payer: 59 | Admitting: Family

## 2020-10-19 ENCOUNTER — Telehealth: Payer: Self-pay | Admitting: Family

## 2020-10-19 DIAGNOSIS — M8580 Other specified disorders of bone density and structure, unspecified site: Secondary | ICD-10-CM

## 2020-10-19 NOTE — Telephone Encounter (Signed)
See mychart.  

## 2020-10-22 ENCOUNTER — Other Ambulatory Visit (HOSPITAL_BASED_OUTPATIENT_CLINIC_OR_DEPARTMENT_OTHER): Payer: Self-pay | Admitting: Family

## 2020-10-22 DIAGNOSIS — Z1231 Encounter for screening mammogram for malignant neoplasm of breast: Secondary | ICD-10-CM

## 2020-11-04 ENCOUNTER — Other Ambulatory Visit: Payer: Self-pay | Admitting: Specialist

## 2020-11-04 DIAGNOSIS — M25561 Pain in right knee: Secondary | ICD-10-CM

## 2020-11-06 ENCOUNTER — Other Ambulatory Visit: Payer: Self-pay | Admitting: Family

## 2020-11-06 NOTE — Telephone Encounter (Signed)
Requesting: alprazolam 0.5mg  Contract: 09/04/2017 UDS: 09/08/2017 Last Visit: 08/14/2020 Next Visit: 12/14/2020 Last Refill: 10/13/2020 #60 and 0RF  Please Advise

## 2020-11-15 ENCOUNTER — Encounter: Payer: Self-pay | Admitting: Family

## 2020-11-16 NOTE — Telephone Encounter (Signed)
Called patient to set up virtual visit,she reports she was seen by her eye doctor.

## 2020-11-17 ENCOUNTER — Encounter: Payer: 59 | Admitting: Family

## 2020-11-24 IMAGING — PT NUCLEAR MEDICINE PET IMAGE INITIAL (PI) SKULL BASE TO THIGH
1 of 8 series · 3 of 16 positions shown, 4 images · non-contrast
Comparison: None.

CLINICAL DATA: Initial treatment strategy for bone lesion, possible
unknown primary malignancy.

EXAM:
NUCLEAR MEDICINE PET SKULL BASE TO THIGH
TECHNIQUE: 12.6 mCi F-18 FDG was injected intravenously. Full-ring PET imaging
was performed from the skull base to thigh after the radiotracer. CT
data was obtained and used for attenuation correction and anatomic
localization.
Fasting blood glucose: 82 mg/dl

[Series 4: ct sk_thigh 5.0 hd_fov · axial · 1.17mm/px · z∈[-877,+11]mm · 3 of 223 slices shown, 4 images]
[im 1/223  soft-tissue]
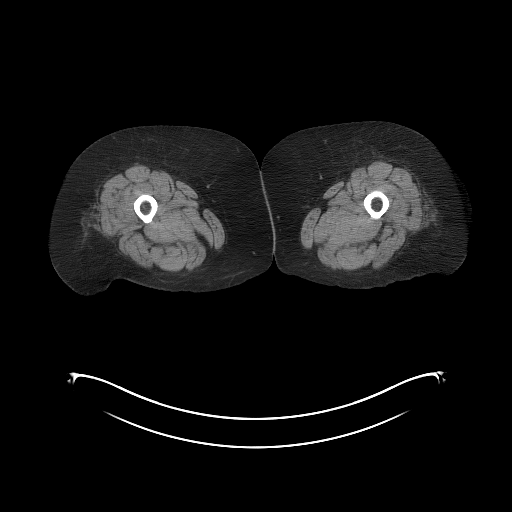
[im 1/223  bone]
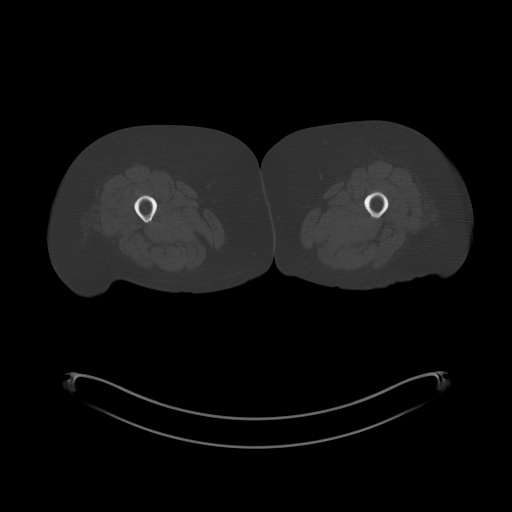
[im 112/223  soft-tissue]
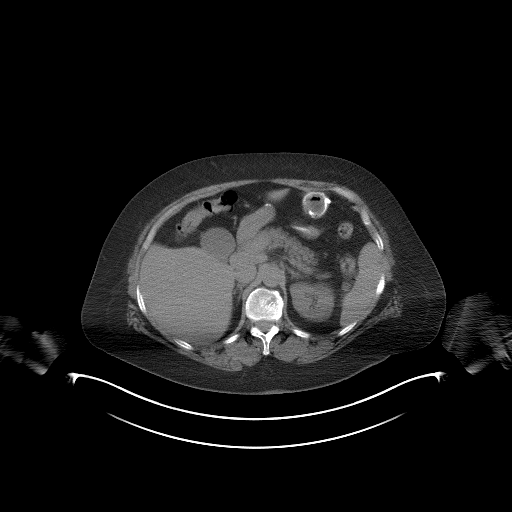
[im 223/223  soft-tissue]
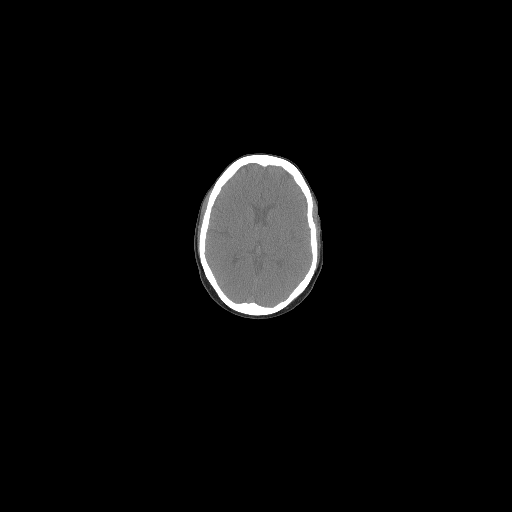

[3 of 16 positions shown; findings below may reference images not displayed]

FINDINGS: Mediastinal blood pool activity: SUV max

Liver activity: SUV max NA

NECK: Hypermetabolic lymph nodes in the neck are seen within index
posterior left lymph node measuring 4 mm (CT image 26) with an SUV
max of 7.9.

Incidental CT findings: None.

CHEST: Hypermetabolic adenopathy is seen throughout the low internal
jugular stations, mediastinum and both hilar regions. Index cluster
of low right paratracheal lymph nodes has an SUV max of 19.1. Right
hilar adenopathy has an SUV max of 23.6. Hypermetabolic adenopathy
extends along the course of the esophagus and is seen in the
prepericardiac regions. There are intramuscular and subcutaneous
hypermetabolic nodules as well. Subcentimeter nodules overlying the
proximal left humerus have an SUV max of 2.6. Multiple
hypermetabolic nodules in the lungs seen in the hematogenous
distribution.

Incidental CT findings: Coronary artery calcification. No
pericardial or pleural effusion.

ABDOMEN/PELVIS: Hypermetabolic lesions are seen in the liver with an
index lesion adjacent to the gallbladder fossa, measuring 10 mm (CT
image 101). No abnormal hypermetabolism in the adrenal glands or
pancreas. There are areas of hypermetabolism in the spleen, with an
SUV max of 7.2 medially. Extensive hypermetabolic adenopathy
throughout the abdomen and pelvis. Periportal nodal mass measures
2.6 cm (CT image 109) with an SUV max 14.7. Aortocaval lymph node
measures 10 mm (CT image 126) with an SUV max of 6.4. Multiple
hypermetabolic mesenteric lymph nodes are seen with an index
ileocolic lymph node measuring 11 mm (143) and an SUV max of 12.6.
Pelvic retroperitoneal and inguinal adenopathy is hypermetabolic
with an index right external iliac lymph node measuring 1.4 cm (165)
with an SUV max of 16.9.

Incidental CT findings: Liver, gallbladder, adrenal glands otherwise
unremarkable. Small stone in the right kidney. Kidneys, spleen and
pancreas are otherwise unremarkable. Postoperative changes in the
stomach and proximal small bowel. Bowel is otherwise unremarkable.

SKELETON: Hypermetabolic lesions are seen throughout osseous
structures. Index lesion in L1 has an SUV max of 14.2.

Incidental CT findings: None.
IMPRESSION: 1. Widespread hypermetabolic metastatic disease involving lymph
nodes throughout the neck, chest, abdomen and pelvis as well as
within lungs, liver, spleen, bones and soft tissues. Findings are
suspicious for lymphoma.
2. Small right renal stone.

## 2020-11-25 ENCOUNTER — Ambulatory Visit
Admission: RE | Admit: 2020-11-25 | Discharge: 2020-11-25 | Disposition: A | Discharge status: Home / Self Care | Payer: 59 | Source: Ambulatory Visit | Attending: Specialist | Admitting: Specialist

## 2020-11-25 ENCOUNTER — Other Ambulatory Visit: Payer: Self-pay

## 2020-11-25 DIAGNOSIS — M25561 Pain in right knee: Secondary | ICD-10-CM

## 2020-11-30 ENCOUNTER — Ambulatory Visit (HOSPITAL_BASED_OUTPATIENT_CLINIC_OR_DEPARTMENT_OTHER): Payer: 59

## 2020-11-30 ENCOUNTER — Other Ambulatory Visit (HOSPITAL_BASED_OUTPATIENT_CLINIC_OR_DEPARTMENT_OTHER): Payer: 59

## 2020-12-14 ENCOUNTER — Other Ambulatory Visit (HOSPITAL_COMMUNITY)
Admission: RE | Admit: 2020-12-14 | Discharge: 2020-12-14 | Disposition: A | Discharge status: Home / Self Care | Payer: 59 | Source: Ambulatory Visit | Attending: Family | Admitting: Family

## 2020-12-14 ENCOUNTER — Ambulatory Visit (INDEPENDENT_AMBULATORY_CARE_PROVIDER_SITE_OTHER): Payer: 59 | Admitting: Family

## 2020-12-14 ENCOUNTER — Encounter: Payer: Self-pay | Admitting: Family

## 2020-12-14 ENCOUNTER — Other Ambulatory Visit: Payer: Self-pay

## 2020-12-14 VITALS — BP 139/62 | HR 83 | Temp 98.7°F | Resp 16 | Wt 222.0 lb

## 2020-12-14 DIAGNOSIS — Z01419 Encounter for gynecological examination (general) (routine) without abnormal findings: Secondary | ICD-10-CM | POA: Insufficient documentation

## 2020-12-14 DIAGNOSIS — Z0000 Encounter for general adult medical examination without abnormal findings: Secondary | ICD-10-CM

## 2020-12-14 DIAGNOSIS — F419 Anxiety disorder, unspecified: Secondary | ICD-10-CM | POA: Diagnosis not present

## 2020-12-14 DIAGNOSIS — E559 Vitamin D deficiency, unspecified: Secondary | ICD-10-CM | POA: Diagnosis not present

## 2020-12-14 DIAGNOSIS — Z9884 Bariatric surgery status: Secondary | ICD-10-CM | POA: Diagnosis not present

## 2020-12-14 DIAGNOSIS — Z862 Personal history of diseases of the blood and blood-forming organs and certain disorders involving the immune mechanism: Secondary | ICD-10-CM

## 2020-12-14 DIAGNOSIS — Z23 Encounter for immunization: Secondary | ICD-10-CM | POA: Diagnosis not present

## 2020-12-14 DIAGNOSIS — E041 Nontoxic single thyroid nodule: Secondary | ICD-10-CM | POA: Diagnosis not present

## 2020-12-14 DIAGNOSIS — I10 Essential (primary) hypertension: Secondary | ICD-10-CM | POA: Diagnosis not present

## 2020-12-14 DIAGNOSIS — F411 Generalized anxiety disorder: Secondary | ICD-10-CM

## 2020-12-14 LAB — COMPREHENSIVE METABOLIC PANEL
ALT: 16 U/L (ref 0–35)
AST: 20 U/L (ref 0–37)
Albumin: 4.2 g/dL (ref 3.5–5.2)
Alkaline Phosphatase: 125 U/L — ABNORMAL HIGH (ref 39–117)
BUN: 14 mg/dL (ref 6–23)
CO2: 30 mEq/L (ref 19–32)
Calcium: 9.3 mg/dL (ref 8.4–10.5)
Chloride: 105 mEq/L (ref 96–112)
Creatinine, Ser: 0.52 mg/dL (ref 0.40–1.20)
GFR: 98.04 mL/min (ref >60.00)
Glucose, Bld: 91 mg/dL (ref 70–99)
Potassium: 3.8 mEq/L (ref 3.5–5.1)
Sodium: 144 mEq/L (ref 135–145)
Total Bilirubin: 0.6 mg/dL (ref 0.2–1.2)
Total Protein: 7.1 g/dL (ref 6.0–8.3)

## 2020-12-14 LAB — VITAMIN B12: Vitamin B-12: 636 pg/mL (ref 211–911)

## 2020-12-14 LAB — CBC WITH DIFFERENTIAL/PLATELET
Basophils Absolute: 0 10*3/uL (ref 0.0–0.1)
Basophils Relative: 0.9 % (ref 0.0–3.0)
Eosinophils Absolute: 0.2 10*3/uL (ref 0.0–0.7)
Eosinophils Relative: 3.2 % (ref 0.0–5.0)
HCT: 37.8 % (ref 36.0–46.0)
Hemoglobin: 12.2 g/dL (ref 12.0–15.0)
Lymphocytes Relative: 27.7 % (ref 12.0–46.0)
Lymphs Abs: 1.4 10*3/uL (ref 0.7–4.0)
MCHC: 32.3 g/dL (ref 30.0–36.0)
MCV: 87.7 fl (ref 78.0–100.0)
Monocytes Absolute: 0.5 10*3/uL (ref 0.1–1.0)
Monocytes Relative: 9.8 % (ref 3.0–12.0)
Neutro Abs: 2.9 10*3/uL (ref 1.4–7.7)
Neutrophils Relative %: 58.4 % (ref 43.0–77.0)
Platelets: 257 10*3/uL (ref 150.0–400.0)
RBC: 4.31 Mil/uL (ref 3.87–5.11)
RDW: 15 % (ref 11.5–15.5)
WBC: 5 10*3/uL (ref 4.0–10.5)

## 2020-12-14 LAB — LIPID PANEL
Cholesterol: 164 mg/dL (ref 0–200)
HDL: 47.5 mg/dL (ref >39.00)
LDL Cholesterol: 89 mg/dL (ref 0–99)
NonHDL: 116.54
Total CHOL/HDL Ratio: 3
Triglycerides: 139 mg/dL (ref 0.0–149.0)
VLDL: 27.8 mg/dL (ref 0.0–40.0)

## 2020-12-14 LAB — VITAMIN D 25 HYDROXY (VIT D DEFICIENCY, FRACTURES): VITD: 29.2 ng/mL — ABNORMAL LOW (ref 30.00–100.00)

## 2020-12-14 LAB — TSH: TSH: 0.35 u[IU]/mL (ref 0.35–5.50)

## 2020-12-14 MED ORDER — ALPRAZOLAM 0.5 MG PO TABS: ORAL_TABLET | ORAL | 0 refills | Status: DC

## 2020-12-14 NOTE — Assessment & Plan Note (Addendum)
Discussed healthy diet, exercise and weight loss. Flu shot today, pneumovax 20 today. . Pap today. Bone density and Mammogram are both scheduled for late November.

## 2020-12-14 NOTE — Patient Instructions (Signed)
Please complete lab work prior to leaving.   

## 2020-12-14 NOTE — Progress Notes (Signed)
Subjective:   By signing my name below, I, Shehryar Baig, attest that this documentation has been prepared under the direction and in the presence of [name] NP. 12/14/2020    Patient ID: [name] [name], female    DOB: [birth year], 65 y.o.   MRN: [id number]  Chief Complaint  Patient presents with   Annual Exam    HPI Patient is in today for a comprehensive physical exam.   Broken femur- She reports falling and breaking her femur about 3 months ago. She is following with orthopedics and is now finally able to ambulate again.  Foot pain- She reports having arthritis in her feet which is causing her pain. She is seeing podiatry for her foot pain. Obesity- She is struggling to lose weight at this time. She is interested in a healthy weight and wellness program.  Rheumatology- She has an upcomming appointment with her rheumatologist specialist November, 2022.  Urology- She is planning to make an appointment with her urologist to follow up. She continues taking 50 mg myrbetriq PRN and reports no new issues while taking it.  Anxiety- She continues taking 0.5-1 mg xanax at night to manage anxiety. She reports having stress from her family. She continues taking 40 mg Paxil daily PO every morning and reports continuing having anxiety due to family and her pets cancer treatments.  Blood pressure- She stopped taking amlodipine daily PO on August 20, 2020 while in the hospital due to her blood pressure being low. She has since stopped taking and and reports no new issues.   BP Readings from Last 3 Encounters:  12/14/20 139/62  07/24/20 138/86  04/15/20 129/71   Pulse Readings from Last 3 Encounters:  12/14/20 83  07/24/20 99  04/15/20 87   Social history- She has no recent changes to her family medical history. She has no recent surgical procedures. She does not drink alcohol at this time. She does not use drugs. She does not use drugs or tobacco products. She lives at home with her  daughter, granddaughter, and partner.  She denies having any unexpected weight change, ear pain, hearing loss and rhinorrhea, visual disturbance, cough, chest pain and leg swelling, nausea, vomiting, diarrhea, constipation, blood in stool, or dysuria and frequency, for myalgias and arthralgias, rash, headaches, adenopathy, depression or anxiety at this time.  Immunizations: She has received her 4th Covid-19 vaccine recently. She is due for flu and pneumonia vaccine.  Diet: She is managing a healthy diet at this time.  Exercise: Her exercise is limited due to recovering from a broken femur.  Colonoscopy: Last completed 12/30/2016. Results showed diverticulosis in left colon, otherwise results are normal. Repeat in 10 years.  Dexa: Last completed 10/10/2018. Results showed she is osteopenic. Repeat in 2 years.  Pap Smear: Last completed 01/16/2017. Results were negative. Repeat in 3 years. She is completing one during this visit.  Mammogram: Last completed 08/02/2019. Results are normal. Repeat in 1 year.  Dental: She is UTD on dental care. Vision: She has an [facility] appointment for a laser procedure on her right eye to manage her cataracts. Otherwise she is UTD on vision care.    Health Maintenance Due  Topic Date Due   COVID-19 Vaccine (3 - Moderna risk series) 08/02/2019   PAP SMEAR-Modifier  01/17/2020    Past Medical History:  Diagnosis Date   Allergy    Anemia    takes iron supplement   Anxiety    Arthritis    feet, shoulders,knees,  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   Elevated alkaline phosphatase level    negative workup (presumed secondary to fatty liver)   Frequency-urgency syndrome    GERD (gastroesophageal reflux disease)    none since weight loss surgery   Interstitial cystitis    Dr Vernie Shanks   Lymphadenopathy of head and neck 11/09/2018   Neuromuscular disorder ([place])    rt arm carpal tunnel syndrome   Obesity    status post bariatric procedure in [place] 2005    Osteoporosis    Sarcoidosis of lung (H[facility])    Thyroid nodule 7/10   `   Thyroid nodule    Varicose vein of leg     Past Surgical History:  Procedure Laterality Date   ANTERIOR CERVICAL DECOMP/DISCECTOMY FUSION N/A 06/21/2012   Procedure: ANTERIOR CERVICAL DECOMPRESSION/DISCECTOMY FUSION C-4 - C5 (SPACER/DePUY CERVICAL PLATE ONLY) 1 LEVEL;  Surgeon: Melina Schools, MD;  Location: [facility];  Service: Orthopedics;  Laterality: N/A;   BLADDER SURGERY  02/21/12 and 02/28/12   Dr Diona Fanti   COLPOSCOPY  1980   CRYOTHERAPY  1980   CYSTO WITH HYDRODISTENSION  12/12/2011   Procedure: CYSTOSCOPY/HYDRODISTENSION;  Surgeon: Franchot Gallo, MD;  Location: [facility];  Service: Urology;  Laterality: N/A;  [place]   bilateral cataract extraction   FOOT SURGERY     bilateral bunionettes   FOOT SURGERY Right 01/07/14   Pt states surgery was due to arthritis. "Has pins in place now"   FOREIGN BODY REMOVAL Right 09/27/2019   Procedure: REMOVAL FOREIGN BOD RIGHT FOOT;  Surgeon: Netta Cedars, MD;  Location: [facility];  Service: Orthopedics;  Laterality: Right;   FRACTURE SURGERY Left 2019   wrist   GASTRIC BYPASS  2005   JOINT REPLACEMENT  2008   left total knee   KNEE SURGERY  1997   left knee   LYMPH NODE DISSECTION Right 11/09/2018   Procedure: EXCISE DEEP RIGHT CERVICAL LYMPH NODE;  Surgeon: Fanny Skates, MD;  Location: [facility];  Service: General;  Laterality: Right;   NASAL SINUS SURGERY  2863   Dr [facility]   REFRACTIVE SURGERY  2006   lasik   REVERSE SHOULDER ARTHROPLASTY Left 09/27/2019   Procedure: REVERSE SHOULDER ARTHROPLASTY;  Surgeon: Netta Cedars, MD;  Location: [facility];  Service: Orthopedics;  Laterality: Left;  2 hrs General with intrascalene block   SHOULDER SURGERY  2 2012   RIGHT    STOMACH SURGERY  2007   "tummy tuck"   TOTAL KNEE ARTHROPLASTY Right 12/31/2015   Procedure: RIGHT TOTAL KNEE ARTHROPLASTY;  Surgeon: [name], MD;  Location: [facility];  Service: Orthopedics;  Laterality: Right;  Adductor Block   VARICOSE VEIN SURGERY Right 02/2012   leg    Family History  Problem Relation Age of Onset   Diabetes Paternal Grandfather    Stroke Paternal Grandfather    Hyperlipidemia Mother    Hypertension Mother    Hypertension Father    Diabetes Father    Prostate cancer Father    Dementia Father    Lung cancer Father    Heart disease Maternal Grandmother    Hypertension Maternal Grandmother    Heart disease Maternal Grandfather    Hypertension Maternal Grandfather    Stroke Maternal Grandfather    Arthritis Brother    Testicular cancer Brother    Colon cancer Neg Hx    Esophageal cancer Neg Hx    Pancreatic cancer Neg Hx  Stomach cancer Neg Hx     Social History   Socioeconomic History   Marital status: Significant Other    Spouse name: Not on file   Number of children: 1   Years of education: Not on file   Highest education level: Not on file  Occupational History   Occupation: MANAGER    Employer: [employer]  Tobacco Use   Smoking status: Never   Smokeless tobacco: Never  Vaping Use   Vaping Use: Never used  Substance and Sexual Activity   Alcohol use: Not Currently    Comment: RARE   Drug use: No   Sexual activity: Yes    Comment: 1st intercourse 65 yo-More than 5 partners  Other Topics Concern   Not on file  Social History Narrative   Lives with her partner, daughter (Bipolar/schizophrenia), granddaughter   Retired at age 24   Social Determinants of Radio broadcast assistant Strain: Not on file  Food Insecurity: Not on file  Transportation Needs: Not on file  Physical Activity: Not on file  Stress: Not on file  Social Connections: Not on file  Intimate Partner Violence: Not on file    Outpatient Medications Prior to Visit  Medication Sig Dispense Refill   acetaminophen (TYLENOL) 650 MG CR tablet Take 2 tablets (1,300 mg total) by mouth every 8 (eight) hours as needed for  pain. Take for mild pain. Do not combine with Percocet. Do not exceed daily recommended limit of Tylenol.     Ascorbic Acid (VITAMIN C) 1000 MG tablet Take 1,000 mg by mouth at bedtime.     Calcium Carb-Cholecalciferol (CALTRATE 600+D3 PO) Take 1 tablet by mouth in the morning and at bedtime.     Cholecalciferol (VITAMIN D3) 50 MCG (2000 UT) TABS Take 4,000 Units by mouth at bedtime.     ferrous sulfate 325 (65 FE) MG tablet Take 325 mg by mouth in the morning and at bedtime.     fexofenadine (ALLEGRA) 180 MG tablet Take 180 mg by mouth at bedtime.     fluticasone (FLONASE) 50 MCG/ACT nasal spray Place 2 sprays into both nostrils daily. 16 g 2   Multiple Vitamin (MULTIVITAMIN WITH MINERALS) TABS tablet Take 1 tablet by mouth at bedtime.     MYRBETRIQ 50 MG TB24 tablet Take 50 mg by mouth at bedtime.   2   omeprazole (PRILOSEC) 40 MG capsule TAKE 1 CAPSULE BY MOUTH EVERY DAY 90 capsule 1   PARoxetine (PAXIL) 40 MG tablet TAKE ONE TABLET BY MOUTH EVERY MORNING 90 tablet 0   pilocarpine (SALAGEN) 5 MG tablet TAKE 1 TABLET BY MOUTH IN THE MORNING, AT NOON AND AT BEDTIME 90 tablet 2   sodium chloride (OCEAN) 0.65 % SOLN nasal spray Place 1-2 sprays into both nostrils 4 (four) times daily as needed for congestion.     valACYclovir (VALTREX) 1000 MG tablet Take 0.5 tablets (500 mg total) by mouth daily. 90 tablet 1   zinc gluconate 50 MG tablet Take 50 mg by mouth at bedtime.      ALPRAZolam (XANAX) 0.5 MG tablet TAKE ONE TABLET BY MOUTH TWICE A DAY AS NEEDED FOR ANXIETY 60 tablet 0   amitriptyline (ELAVIL) 50 MG tablet Take 50 mg by mouth at bedtime.   3   amoxicillin-clavulanate (AUGMENTIN) 875-125 MG tablet Take 1 tablet by mouth 2 (two) times daily. 28 tablet 0   Lifitegrast (XIIDRA) 5 % SOLN Place 1 drop into both eyes 2 (two) times  daily.      amLODipine (NORVASC) 5 MG tablet Take 1 tablet (5 mg total) by mouth daily. (Patient not taking: Reported on 12/14/2020) 90 tablet 1   fluconazole  (DIFLUCAN) 150 MG tablet Take 1 tab by mouth as needed for yeast infection, may repeat in 3 days if needed. (Patient not taking: Reported on 12/14/2020) 2 tablet 0   ketotifen (ZADITOR) 0.025 % ophthalmic solution Place 1 drop into both eyes 2 (two) times daily as needed (allergy eyes.). (Patient not taking: Reported on 12/14/2020)     No facility-administered medications prior to visit.    Allergies  Allergen Reactions   Nitrofurantoin     REACTION: fever    Review of Systems  Constitutional:  Negative for fever.  HENT:  Negative for congestion and sore throat.   Respiratory:  Negative for cough, shortness of breath and wheezing.   Cardiovascular:  Negative for chest pain.  Gastrointestinal:  Negative for blood in stool, constipation, diarrhea, nausea and vomiting.  Genitourinary:  Negative for dysuria, frequency and hematuria.  Musculoskeletal:  Negative for joint pain and myalgias.       (+)foot pain  Skin:        (-)New moles  Neurological:  Negative for headaches.      Objective:    Physical Exam Constitutional:      General: She is not in acute distress.    Appearance: Normal appearance. She is not ill-appearing.  HENT:     Head: Normocephalic and atraumatic.     Right Ear: Tympanic membrane, ear canal and external ear normal.     Left Ear: Tympanic membrane, ear canal and external ear normal.  Eyes:     Extraocular Movements: Extraocular movements intact.     Pupils: Pupils are equal, round, and reactive to light.     Comments: No nystagmus  Neck:     Thyroid: No thyromegaly or thyroid tenderness.  Cardiovascular:     Rate and Rhythm: Normal rate and regular rhythm.     Heart sounds: Normal heart sounds. No murmur heard.   No gallop.  Pulmonary:     Effort: Pulmonary effort is normal. No respiratory distress.     Breath sounds: Normal breath sounds. No wheezing or rales.  Abdominal:     General: There is no distension.     Palpations: Abdomen is soft.      Tenderness: There is no abdominal tenderness. There is no guarding.  Musculoskeletal:     Comments: 5/5 strength in both upper and lower extremities   Lymphadenopathy:     Cervical: No cervical adenopathy.  Skin:    General: Skin is warm and dry.  Neurological:     Mental Status: She is alert and oriented to person, place, and time.     Deep Tendon Reflexes:     Reflex Scores:      Patellar reflexes are 0 on the right side and 0 on the left side.    Comments: Absent patellar reflexes due to knee surgery  Psychiatric:        Behavior: Behavior normal.        Judgment: Judgment normal.    BP 139/62 (BP Location: Right Arm, Patient Position: Sitting, Cuff Size: Large)   Pulse 83   Temp 98.7 F (37.1 C) (Oral)   Resp 16   Wt 222 lb (100.7 kg)   SpO2 99%   BMI 38.11 kg/m  Wt Readings from Last 3 Encounters:  12/14/20 222 lb (100.7 kg)  07/24/20 231 lb 9.6 oz (105.1 kg)  06/04/20 230 lb (104.3 kg)       Assessment & Plan:   Problem List Items Addressed This Visit       Unprioritized   Vitamin D deficiency   Relevant Orders   Vitamin D (25 hydroxy)   THYROID NODULE, RIGHT   Relevant Orders   TSH   MORBID OBESITY   Relevant Orders   Amb Ref to Medical Weight Management   Comp Met (CMET)   Lipid panel   Essential hypertension    BP Readings from Last 3 Encounters:  12/14/20 139/62  07/24/20 138/86  04/15/20 129/71  She reports that amlodipine was discontinued while hospitalized.       Encounter for preventive care - Primary    Discussed healthy diet, exercise and weight loss. Flu shot today, pneumovax 20 today. . Pap today. Bone density and Mammogram are both scheduled for late November.       Anxiety state    Continues xanax prn. UDS today, controlled substance contract is updated.       Relevant Medications   ALPRAZolam (XANAX) 0.5 MG tablet   Other Visit Diagnoses     Needs flu shot       Relevant Orders   Flu Vaccine QUAD 6+ mos PF IM (Fluarix Quad  PF) (Completed)   Anxiety       Relevant Medications   ALPRAZolam (XANAX) 0.5 MG tablet   Other Relevant Orders   DRUG MONITORING, PANEL 8 WITH CONFIRMATION, URINE   History of anemia       Relevant Orders   CBC with Differential/Platelet   History of gastric bypass       Relevant Orders   B12   Encounter for routine gynecological examination with Papanicolaou smear of cervix       Relevant Orders   Cytology - PAP( [place])   Need for pneumococcal vaccine       Relevant Orders   Pneumococcal conjugate vaccine 20-valent (Prevnar 20) (Completed)        Meds ordered this encounter  Medications   ALPRAZolam (XANAX) 0.5 MG tablet    Sig: TAKE ONE TABLET BY MOUTH TWICE A DAY AS NEEDED FOR ANXIETY    Dispense:  60 tablet    Refill:  0    Order Specific Question:   Supervising Provider    Answer:   Penni Homans A [[id number]]     I, [name] NP, personally preformed the services described in this documentation.  All medical record entries made by the scribe were at my direction and in my presence.  I have reviewed the chart and discharge instructions (if applicable) and agree that the record reflects my personal performance and is accurate and complete. 12/14/2020   I,Shehryar Baig,acting as a Education administrator for Nance Pear, NP.,have documented all relevant documentation on the behalf of Nance Pear, NP,as directed by  Nance Pear, NP while in the presence of Nance Pear, NP.   Nance Pear, NP

## 2020-12-14 NOTE — Assessment & Plan Note (Signed)
BP Readings from Last 3 Encounters:  12/14/20 139/62  07/24/20 138/86  04/15/20 129/71   She reports that amlodipine was discontinued while hospitalized.

## 2020-12-14 NOTE — Assessment & Plan Note (Signed)
Continues xanax prn. UDS today, controlled substance contract is updated.

## 2020-12-16 LAB — CYTOLOGY - PAP
Comment: NEGATIVE
Diagnosis: UNDETERMINED — AB
High risk HPV: POSITIVE — AB

## 2020-12-16 LAB — DRUG MONITORING, PANEL 8 WITH CONFIRMATION, URINE
6 Acetylmorphine: NEGATIVE ng/mL (ref <10)
Alcohol Metabolites: NEGATIVE ng/mL (ref <500)
Alphahydroxyalprazolam: 767 ng/mL — ABNORMAL HIGH (ref <25)
Alphahydroxymidazolam: NEGATIVE ng/mL (ref <50)
Alphahydroxytriazolam: NEGATIVE ng/mL (ref <50)
Aminoclonazepam: NEGATIVE ng/mL (ref <25)
Amphetamines: NEGATIVE ng/mL (ref <500)
Benzodiazepines: POSITIVE ng/mL — AB (ref <100)
Buprenorphine, Urine: NEGATIVE ng/mL (ref <5)
Cocaine Metabolite: NEGATIVE ng/mL (ref <150)
Creatinine: 169.1 mg/dL (ref >=20.0)
Hydroxyethylflurazepam: NEGATIVE ng/mL (ref <50)
Lorazepam: NEGATIVE ng/mL (ref <50)
MDMA: NEGATIVE ng/mL (ref <500)
Marijuana Metabolite: NEGATIVE ng/mL (ref <20)
Nordiazepam: NEGATIVE ng/mL (ref <50)
Opiates: NEGATIVE ng/mL (ref <100)
Oxazepam: NEGATIVE ng/mL (ref <50)
Oxidant: NEGATIVE ug/mL (ref <200)
Oxycodone: NEGATIVE ng/mL (ref <100)
Temazepam: NEGATIVE ng/mL (ref <50)
pH: 5.7 (ref 4.5–9.0)

## 2020-12-16 LAB — DM TEMPLATE

## 2020-12-17 ENCOUNTER — Telehealth: Payer: Self-pay | Admitting: Family

## 2020-12-17 DIAGNOSIS — R87610 Atypical squamous cells of undetermined significance on cytologic smear of cervix (ASC-US): Secondary | ICD-10-CM | POA: Insufficient documentation

## 2020-12-17 NOTE — Telephone Encounter (Signed)
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 I would like to refer her to gyn for further evaluation.

## 2020-12-20 NOTE — Telephone Encounter (Signed)
Just checking that you saw this message. Tks.

## 2020-12-21 ENCOUNTER — Other Ambulatory Visit: Payer: Self-pay | Admitting: *Deleted

## 2020-12-21 DIAGNOSIS — R87610 Atypical squamous cells of undetermined significance on cytologic smear of cervix (ASC-US): Secondary | ICD-10-CM

## 2020-12-21 NOTE — Telephone Encounter (Signed)
Patient advised of results and referral ?

## 2020-12-23 ENCOUNTER — Telehealth (INDEPENDENT_AMBULATORY_CARE_PROVIDER_SITE_OTHER): Payer: 59 | Admitting: Medical

## 2020-12-23 ENCOUNTER — Other Ambulatory Visit: Payer: Self-pay

## 2020-12-23 ENCOUNTER — Encounter: Payer: Self-pay | Admitting: Medical

## 2020-12-23 VITALS — Temp 98.3°F

## 2020-12-23 DIAGNOSIS — J302 Other seasonal allergic rhinitis: Secondary | ICD-10-CM

## 2020-12-23 DIAGNOSIS — R0981 Nasal congestion: Secondary | ICD-10-CM | POA: Diagnosis not present

## 2020-12-23 DIAGNOSIS — J0100 Acute maxillary sinusitis, unspecified: Secondary | ICD-10-CM

## 2020-12-23 MED ORDER — AMOXICILLIN-POT CLAVULANATE 875-125 MG PO TABS: 1.0000 | ORAL_TABLET | Freq: Two times a day (BID) | ORAL | 0 refills | Status: DC

## 2020-12-23 MED ORDER — FLUTICASONE PROPIONATE 50 MCG/ACT NA SUSP: 2.0000 | Freq: Every day | NASAL | 1 refills | Status: DC

## 2020-12-23 MED ORDER — BENZONATATE 100 MG PO CAPS: 100.0000 mg | ORAL_CAPSULE | Freq: Three times a day (TID) | ORAL | 0 refills | Status: DC | PRN

## 2020-12-23 NOTE — Progress Notes (Signed)
   Subjective:    Patient ID: [name] [name], female    DOB: 02/17/1955, 65 y.o.   MRN: [id number]  HPI  Virtual Visit via Video Note  I connected with [name] [name] on 12/23/20 at 11:00 AM EST by a video enabled telemedicine application and verified that I am speaking with the correct person using two identifiers.  Location: Patient: home Provider: office   I discussed the limitations of evaluation and management by telemedicine and the availability of in person appointments. The patient expressed understanding and agreed to proceed.  History of Present Illness:   Pt states she feels like has sinus infection. Pt got flu vaccine and pneumonia last week. She states next day nasal congestions and runny nose. Mild loose stools. Also had body aches day after vaccine.  Since vaccine 9 days ago still feels sinus pressure. When blows nose will get clumps of green colored mucus.  Maxillary and frontal sinus pressure.   Upper teeth pain recently.  Covid test negative yesterday. Pt had 4 covid vaccines.    Observations/Objective:  General-no acute distress, pleasant, oriented. Lungs- on inspection lungs appear unlabored. Neck- no tracheal deviation or jvd on inspection. Neuro- gross motor function appears intact.   Assessment and Plan:  Patient Instructions  Sinus infection over the past 9 to 10 days.  Will prescribe Augmentin antibiotic, Flonase and benzonatate for cough.  If signs and symptoms worsen or change notify us.  No described chest congestion presently but if you develop that we will get a chest x-ray.  If signs and symptoms do not improve then would recommend an office visit as physical exam would be helpful and injection of antibiotic could be helpful for resistant sinus infection.  Follow-up in 7 to 10 days or sooner if needed.    Mackie Pai, PA-C  Follow Up Instructions:    I discussed the assessment and treatment plan with the patient. The  patient was provided an opportunity to ask questions and all were answered. The patient agreed with the plan and demonstrated an understanding of the instructions.   The patient was advised to call back or seek an in-person evaluation if the symptoms worsen or if the condition fails to improve as anticipated.  Time spent with patient today was  22 minutes which consisted of chart revdiew, discussing diagnosis, work up treatment and documentation.    Mackie Pai, PA-C   Review of Systems  Constitutional:  Negative for chills, fatigue and fever.  HENT:  Positive for congestion, sinus pressure and sinus pain.   Respiratory:  Negative for cough, choking and wheezing.   Cardiovascular:  Negative for chest pain and palpitations.  Gastrointestinal:  Negative for abdominal pain.  Musculoskeletal:  Negative for arthralgias, back pain, gait problem and neck stiffness.  Skin:  Negative for rash.  Neurological:  Negative for dizziness.  Hematological:  Negative for adenopathy. Does not bruise/bleed easily.  Psychiatric/Behavioral:  Negative for agitation, confusion and dysphoric mood. The patient is not nervous/anxious.       Objective:   Physical Exam        Assessment & Plan:

## 2020-12-23 NOTE — Patient Instructions (Addendum)
Sinus infection over the past 9 to 10 days.  Will prescribe Augmentin antibiotic, Flonase and benzonatate for cough.  If signs and symptoms worsen or change notify us.  No described chest congestion presently but if you develop that we will get a chest x-ray.  If signs and symptoms do not improve then would recommend an office visit as physical exam would be helpful and injection of antibiotic could be helpful for resistant sinus infection.  Follow-up in 7 to 10 days or sooner if needed.

## 2020-12-25 ENCOUNTER — Encounter: Payer: Self-pay | Admitting: Physical Therapy

## 2020-12-25 ENCOUNTER — Other Ambulatory Visit: Payer: Self-pay

## 2020-12-25 ENCOUNTER — Ambulatory Visit (INDEPENDENT_AMBULATORY_CARE_PROVIDER_SITE_OTHER): Payer: 59 | Admitting: Physical Therapy

## 2020-12-25 DIAGNOSIS — M25571 Pain in right ankle and joints of right foot: Secondary | ICD-10-CM

## 2020-12-25 DIAGNOSIS — R2689 Other abnormalities of gait and mobility: Secondary | ICD-10-CM

## 2020-12-25 DIAGNOSIS — R262 Difficulty in walking, not elsewhere classified: Secondary | ICD-10-CM

## 2020-12-25 NOTE — Patient Instructions (Signed)
Access Code: [email or web address] URL: [email or web address] Date: 12/25/2020 Prepared by: Isabelle Course  Exercises Long Sitting Calf Stretch with Strap - 1 x daily - 7 x weekly - 3 sets - 1 reps - 20-30 seconds hold Ankle Inversion Eversion PROM in Dorsiflexion - 1 x daily - 7 x weekly - 10 reps - 10 seconds hold Standing Tandem Balance with Counter Support - 1 x daily - 7 x weekly - 3 sets - 1 reps - 30 seconds hold Standing Single Leg Stance with Counter Support - 1 x daily - 7 x weekly - 3 sets - 1 reps - 30 seconds hold Forward Step Up with Counter Support - 1 x daily - 7 x weekly - 3 sets - 10 reps

## 2020-12-25 NOTE — Therapy (Signed)
[place], [place], [address] Phone: [phone]   Fax:  [phone]  Physical Therapy Evaluation  Patient Details  Name: [name] [name] MRN: [id number] Date of Birth: 1955-07-02 Referring Provider (PT): Wylene Simmer   Encounter Date: 12/25/2020   PT End of Session - 12/25/20 1219     Visit Number 1    Number of Visits 12    Date for PT Re-Evaluation 02/05/21    PT Start Time 1100    PT Stop Time 1145    PT Time Calculation (min) 45 min    Activity Tolerance Patient tolerated treatment well    Behavior During Therapy [facility] for tasks assessed/performed             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   Elevated alkaline phosphatase level    negative workup (presumed secondary to fatty liver)   Frequency-urgency syndrome    GERD (gastroesophageal reflux disease)    none since weight loss surgery   Interstitial cystitis    Dr Vernie Shanks   Lymphadenopathy of head and neck 11/09/2018   Neuromuscular disorder ([place])    rt arm carpal tunnel syndrome   Obesity    status post bariatric procedure in [place] 2005   Osteoporosis    Sarcoidosis of lung (HCC)    Thyroid nodule 7/10   `   Thyroid nodule    Varicose vein of leg     Past Surgical History:  Procedure Laterality Date   ANTERIOR CERVICAL DECOMP/DISCECTOMY FUSION N/A 06/21/2012   Procedure: ANTERIOR CERVICAL DECOMPRESSION/DISCECTOMY FUSION C-4 - C5 (SPACER/DePUY CERVICAL PLATE ONLY) 1 LEVEL;  Surgeon: Melina Schools, MD;  Location: [place];  Service: Orthopedics;  Laterality: N/A;   BLADDER SURGERY  02/21/12 and 02/28/12   Dr Diona Fanti   COLPOSCOPY  1980   CRYOTHERAPY  1980   CYSTO WITH HYDRODISTENSION  12/12/2011   Procedure: CYSTOSCOPY/HYDRODISTENSION;  Surgeon: Franchot Gallo, MD;  Location: [facility];  Service: Urology;  Laterality: N/A;  [place]   bilateral cataract extraction   FOOT SURGERY     bilateral bunionettes   FOOT SURGERY Right 01/07/14   Pt states surgery was due to arthritis. "Has pins in place now"   FOREIGN BODY REMOVAL Right 09/27/2019   Procedure: REMOVAL FOREIGN BOD RIGHT FOOT;  Surgeon: Netta Cedars, MD;  [facility];  Service: Orthopedics;  Laterality: Right;   FRACTURE SURGERY Left 2019   wrist   GASTRIC BYPASS  2005   JOINT REPLACEMENT  2008   left total knee   KNEE SURGERY  1997   left knee   LYMPH NODE DISSECTION Right 11/09/2018   Procedure: EXCISE DEEP RIGHT CERVICAL LYMPH NODE;  Surgeon: Fanny Skates, MD;  Location: [place];  Service: General;  Laterality: Right;   NASAL SINUS SURGERY  1607   Dr [facility]   REFRACTIVE SURGERY  2006   lasik   REVERSE SHOULDER ARTHROPLASTY Left 09/27/2019   Procedure: REVERSE SHOULDER ARTHROPLASTY;  Surgeon: Netta Cedars, MD;  Location[facility];  Service: Orthopedics;  Laterality: Left;  2 hrs General with intrascalene block   SHOULDER SURGERY  2 2012   RIGHT    STOMACH SURGERY  2007   "tummy  tuck"   TOTAL KNEE ARTHROPLASTY Right 12/31/2015   Procedure: RIGHT TOTAL KNEE ARTHROPLASTY;  Surgeon: [name], MD;  [facility];  Service: Orthopedics;  Laterality: Right;  Adductor Block   VARICOSE VEIN SURGERY Right 02/2012   leg    There were no vitals filed for this visit.    Subjective Assessment - 12/25/20 1102     Subjective Pt states she fractured Rt femur 08/20/2020 and has only been wt bearing since 11/2020. She performs gait with rollator. Since wt bearing she has had increased pain in Rt foot/ankle. She also has significant history of falls and impaired balance. Pain increases with standing and walking, eases with rest and use of support socks and new tennis shoes.    Pertinent History bilat knee replacements, ACDF    Limitations Walking;Standing    How long  can you stand comfortably? 15 minutes    How long can you walk comfortably? 15 minutes    Diagnostic tests none performed    Patient Stated Goals decrease pain, improve balance    Currently in Pain? Yes    Pain Score 4     Pain Location Foot    Pain Orientation Right    Pain Descriptors / Indicators Burning;Numbness    Pain Type Chronic pain    Pain Onset More than a month ago    Pain Frequency Constant    Aggravating Factors  stand, walk    Pain Relieving Factors compression socks                [facility] PT Assessment - 12/25/20 0001       Assessment   Medical Diagnosis primary OA of  ankle and foot    Referring Provider (PT) Wylene Simmer    Onset Date/Surgical Date 08/20/20    Hand Dominance Right    Next MD Visit 02/2021    Prior Therapy rehab for Rt femur fracture      Precautions   Precautions None      Restrictions   Other Position/Activity Restrictions WBAT      Balance Screen   Has the patient fallen in the past 6 months Yes    How many times? 3    Has the patient had a decrease in activity level because of a fear of falling?  Yes    Is the patient reluctant to leave their home because of a fear of falling?  Yes      Prior Function   Level of Independence Independent with community mobility with device      Observation/Other Assessments   Focus on Therapeutic Outcomes (FOTO)  not assessed      ROM / Strength   AROM / PROM / Strength AROM;Strength      AROM   AROM Assessment Site Ankle    Right/Left Ankle Right;Left    Right Ankle Dorsiflexion -4    Right Ankle Plantar Flexion 25    Right Ankle Inversion 4    Right Ankle Eversion 14    Left Ankle Dorsiflexion 4    Left Ankle Plantar Flexion 29    Left Ankle Inversion 30    Left Ankle Eversion 12      Strength   Strength Assessment Site Ankle    Right/Left Ankle Right;Left    Right Ankle Dorsiflexion 4/5    Right Ankle Plantar Flexion 4/5    Right Ankle Inversion 3/5    Right Ankle Eversion  3/5    Left Ankle Dorsiflexion 4/5  Left Ankle Plantar Flexion 4/5    Left Ankle Inversion 4/5    Left Ankle Eversion 4/5      Palpation   Palpation comment no TTP bilat ankles, hypomobile Rt ankle and foot jts      Ambulation/Gait   Assistive device Rollator    Gait Pattern Decreased arm swing - right;Decreased arm swing - left;Decreased step length - right;Decreased step length - left   Rt hip in ER during gait   Gait velocity decreased      Standardized Balance Assessment   Standardized Balance Assessment Timed Up and Go Test;Berg Balance Test      Berg Balance Test   Sit to Stand Able to stand without using hands and stabilize independently    Standing Unsupported Able to stand safely 2 minutes    Sitting with Back Unsupported but Feet Supported on Floor or Stool Able to sit safely and securely 2 minutes    Stand to Sit Sits safely with minimal use of hands    Transfers Able to transfer safely, definite need of hands    Standing Unsupported with Eyes Closed Able to stand 10 seconds safely    Standing Unsupported with Feet Together Able to place feet together independently and stand for 1 minute with supervision    From Standing, Reach Forward with Outstretched Arm Can reach forward >12 cm safely (5")    From Standing Position, Pick up Object from Floor Able to pick up shoe safely and easily    From Standing Position, Turn to Look Behind Over each Shoulder Turn sideways only but maintains balance    Turn 360 Degrees Able to turn 360 degrees safely but slowly    Standing Unsupported, Alternately Place Feet on Step/Stool Needs assistance to keep from falling or unable to try    Standing Unsupported, One Foot in Front Needs help to step but can hold 15 seconds    Standing on One Leg Unable to try or needs assist to prevent fall    Total Score 38      Timed Up and Go Test   Normal TUG (seconds) 25.75                        Objective measurements completed on  examination: See above findings.       [place] Adult PT Treatment/Exercise - 12/25/20 0001       Exercises   Exercises Ankle      Ankle Exercises: Stretches   Gastroc Stretch 2 reps;20 seconds    Other Stretch inversion/eversion stretch with      Ankle Exercises: Standing   SLS at counter up to 30 sec    Other Standing Ankle Exercises tandem stance 30 sec bilat at counter, tap ups to 6'' step at counter x 10 bilat                     PT Education - 12/25/20 1140     Education Details PT POC and goals, HEP    Person(s) Educated Patient    Methods Explanation;Demonstration;Handout    Comprehension Verbalized understanding;Returned demonstration                 PT Long Term Goals - 12/25/20 1250       PT LONG TERM GOAL #1   Title Pt will be independent in HEP    Time 6    Period Weeks    Status New  Target Date 02/05/21      PT LONG TERM GOAL #2   Title Pt will improve Berg balance score to >= 45/56 to demo improved balance and decreased risk of falls    Time 6    Period Weeks    Status New    Target Date 02/05/21      PT LONG TERM GOAL #3   Title Pt will improve TUG to <= 21 seconds to demo improved balance and decreased fall risk    Time 6    Period Weeks    Status New    Target Date 02/05/21      PT LONG TERM GOAL #4   Title Pt will tolerate standing x 20 minutes with pain <= 2/10    Time 6    Period Weeks    Status New    Target Date 02/05/21                    Plan - 12/25/20 1247     Clinical Impression Statement Pt is a 65 y/o female referred for bilat ankle and foot OA. She presents with decreased strength, ROM and balance. Difficulty walking and decreased activity tolerance. Pt will benefit from skilled PT to address deficits and reduce risk of falls and improve functional mobility.    Personal Factors and Comorbidities Comorbidity 3+    Examination-Activity Limitations Stand;Stairs;Locomotion Level     Examination-Participation Restrictions Community Activity;Cleaning;Meal Prep    Stability/Clinical Decision Making Evolving/Moderate complexity    Clinical Decision Making Moderate    Rehab Potential Good    PT Frequency 2x / week    PT Duration 6 weeks    PT Treatment/Interventions Aquatic Therapy;Cryotherapy;Moist Heat;Iontophoresis 4mg /ml Dexamethasone;Electrical Stimulation;Gait training;Stair training;Neuromuscular re-education;Balance training;Therapeutic exercise;Therapeutic activities;Patient/family education;Manual techniques;Dry needling;Taping;Vasopneumatic Device    PT Next Visit Plan assess HEP, progress balance exercises and ankle and LE strengthening    PT Home Exercise Plan [email or web address]    Consulted and Agree with Plan of Care Patient             Patient will benefit from skilled therapeutic intervention in order to improve the following deficits and impairments:  Pain, Decreased strength, Decreased activity tolerance, Decreased range of motion, Hypomobility, Difficulty walking, Decreased balance  Visit Diagnosis: Balance problems - Plan: PT plan of care cert/re-cert  Pain in right ankle and joints of right foot - Plan: PT plan of care cert/re-cert  Difficulty in walking, not elsewhere classified - Plan: PT plan of care cert/re-cert     Problem List Patient Active Problem List   Diagnosis Date Noted   Atypical squamous cell changes of undetermined significance (ASCUS) on cervical cytology with positive 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) 12/17/2020   Acute non-recurrent pansinusitis 07/24/2020   H/O total shoulder replacement, 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   Sarcoidosis 12/11/2018   Lymphadenopathy of head and neck 11/09/2018   Primary osteoarthritis of right knee 12/31/2015   Spinal stenosis of lumbar region 12/31/2015   S/P TKR (total knee replacement), right 12/31/2015   Diverticulitis of intestine without perforation or abscess without bleeding 09/18/2013   Encounter for  preventive care 06/28/2013   Recurrent sinusitis 02/05/2013   Overactive bladder 01/04/2013   Onychomycosis 09/19/2012   Right bundle branch block 03/07/2012   Anemia 03/03/2011   Rosacea 10/30/2010   THYROID NODULE, RIGHT 09/10/2008   THYROID STIMULATING HORMONE, ABNORMAL 09/08/2008   Vitamin D deficiency 09/08/2008   ALKALINE PHOSPHATASE, ELEVATED 09/08/2008   OSTEOPOROSIS 08/21/2008   BARIATRIC SURGERY  STATUS 08/21/2008   MORBID OBESITY 02/27/2007   Anxiety state 02/27/2007   Essential hypertension 02/27/2007   HEMORRHOIDS 02/27/2007   VENOUS INSUFFICIENCY 02/27/2007   Allergic rhinitis 02/27/2007   GERD 02/27/2007   DEGENERATIVE JOINT DISEASE 02/27/2007    Roshawnda Pecora, PT 12/25/2020, 12:58 PM  [facility] [place], [place], [address] Phone: [phone]   Fax:  [phone]  Name: [name] [name] MRN: [id number] Date of Birth: May 26, 1955

## 2020-12-28 ENCOUNTER — Other Ambulatory Visit: Payer: Self-pay

## 2020-12-28 ENCOUNTER — Ambulatory Visit (INDEPENDENT_AMBULATORY_CARE_PROVIDER_SITE_OTHER): Payer: 59 | Admitting: Physical Therapy

## 2020-12-28 ENCOUNTER — Encounter: Payer: Self-pay | Admitting: Physical Therapy

## 2020-12-28 DIAGNOSIS — R262 Difficulty in walking, not elsewhere classified: Secondary | ICD-10-CM

## 2020-12-28 DIAGNOSIS — M25571 Pain in right ankle and joints of right foot: Secondary | ICD-10-CM

## 2020-12-28 DIAGNOSIS — R2689 Other abnormalities of gait and mobility: Secondary | ICD-10-CM

## 2020-12-28 NOTE — Patient Instructions (Signed)
Access Code: [email or web address] URL: [email or web address] Date: 12/28/2020 Prepared by: [place]  Exercises Long Sitting Calf Stretch with Strap - 1 x daily - 7 x weekly - 3 sets - 1 reps - 20-30 seconds hold Ankle Inversion Eversion PROM in Dorsiflexion - 1 x daily - 7 x weekly - 10 reps - 10 seconds hold Standing Tandem Balance with Counter Support - 1 x daily - 7 x weekly - 3 sets - 1 reps - 30 seconds hold Standing Single Leg Stance with Counter Support - 1 x daily - 7 x weekly - 1 sets - 2-3 reps - 15 seconds hold Forward Step Up with Counter Support - 1 x daily - 7 x weekly - 3 sets - 10 reps Side Stepping with Counter Support - 1 x daily - 7 x weekly - 2 sets - 10 reps Standing March with Counter Support - 1 x daily - 7 x weekly - 2 sets - 10 reps Seated Ankle Eversion with Resistance - 1 x daily - 7 x weekly - 2 sets - 10 reps

## 2020-12-28 NOTE — Therapy (Signed)
[place], [place], [address] Phone: [phone]   Fax:  [phone]  Physical Therapy Treatment  Patient Details  Name: [name] [name] MRN: [id number] Date of Birth: [birth year] Referring Provider (PT): Wylene Simmer   Encounter Date: 12/28/2020   PT End of Session - 12/28/20 0947     Visit Number 2    Number of Visits 12    Date for PT Re-Evaluation 02/05/21    PT Start Time 0935    PT Stop Time 1017    PT Time Calculation (min) 42 min    Activity Tolerance Patient tolerated treatment well    Behavior During Therapy [facility] for tasks assessed/performed             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   Elevated alkaline phosphatase level    negative workup (presumed secondary to fatty liver)   Frequency-urgency syndrome    GERD (gastroesophageal reflux disease)    none since weight loss surgery   Interstitial cystitis    Dr Vernie Shanks   Lymphadenopathy of head and neck 11/09/2018   Neuromuscular disorder ([place])    rt arm carpal tunnel syndrome   Obesity    status post bariatric procedure in [place] 2005   Osteoporosis    Sarcoidosis of lung (HCC)    Thyroid nodule 7/10   `   Thyroid nodule    Varicose vein of leg     Past Surgical History:  Procedure Laterality Date   ANTERIOR CERVICAL DECOMP/DISCECTOMY FUSION N/A 06/21/2012   Procedure: ANTERIOR CERVICAL DECOMPRESSION/DISCECTOMY FUSION C-4 - C5 (SPACER/DePUY CERVICAL PLATE ONLY) 1 LEVEL;  Surgeon: Melina Schools, MD;  Location: [place];  Service: Orthopedics;  Laterality: N/A;   BLADDER SURGERY  02/21/12 and 02/28/12   Dr Diona Fanti   COLPOSCOPY  1980   CRYOTHERAPY  1980   CYSTO WITH HYDRODISTENSION  12/12/2011   Procedure: CYSTOSCOPY/HYDRODISTENSION;  Surgeon: Franchot Gallo, MD;  Location[facility];  Service: Urology;  Laterality: N/A;  [place]   bilateral cataract extraction   FOOT SURGERY     bilateral bunionettes   FOOT SURGERY Right 01/07/14   Pt states surgery was due to arthritis. "Has pins in place now"   FOREIGN BODY REMOVAL Right 09/27/2019   Procedure: REMOVAL FOREIGN BOD RIGHT FOOT;  Surgeon: Netta Cedars, MD;  [facility];  Service: Orthopedics;  Laterality: Right;   FRACTURE SURGERY Left 2019   wrist   GASTRIC BYPASS  2005   JOINT REPLACEMENT  2008   left total knee   KNEE SURGERY  1997   left knee   LYMPH NODE DISSECTION Right 11/09/2018   Procedure: EXCISE DEEP RIGHT CERVICAL LYMPH NODE;  Surgeon: Fanny Skates, MD;  Location: [place];  Service: General;  Laterality: Right;   NASAL SINUS SURGERY  0737   Dr [facility]   REFRACTIVE SURGERY  2006   lasik   REVERSE SHOULDER ARTHROPLASTY Left 09/27/2019   Procedure: REVERSE SHOULDER ARTHROPLASTY;  Surgeon: Netta Cedars, MD;  [facility];  Service: Orthopedics;  Laterality: Left;  2 hrs General with intrascalene block   SHOULDER SURGERY  2 2012   RIGHT    STOMACH SURGERY  2007   "tummy  tuck"   TOTAL KNEE ARTHROPLASTY Right 12/31/2015   Procedure: RIGHT TOTAL KNEE ARTHROPLASTY;  Surgeon: [name], MD;  [facility];  Service: Orthopedics;  Laterality: Right;  Adductor Block   VARICOSE VEIN SURGERY Right 02/2012   leg    There were no vitals filed for this visit.   Subjective Assessment - 12/28/20 0939     Subjective "Today was the first day that I took a shower by myself without assistance".  She reports some of the exercises of HEP is difficult because she has trouble getting her foot up (eversion/inversion).    Patient Stated Goals decrease pain, improve balance    Currently in Pain? Yes    Pain Score 1     Pain Location Foot    Pain Orientation Right    Pain Descriptors / Indicators Numbness;Aching    Aggravating Factors  standing, walking    Pain  Relieving Factors compression socks.                [facility] PT Assessment - 12/28/20 0001       Assessment   Medical Diagnosis primary OA of  ankle and foot    Referring Provider (PT) Wylene Simmer    Onset Date/Surgical Date 08/20/20    Hand Dominance Right    Next MD Visit 02/2021    Prior Therapy rehab for Rt femur fracture               [facility] Adult PT Treatment/Exercise - 12/28/20 0001       Ankle Exercises: Stretches   Gastroc Stretch 2 reps;30 seconds   seated with strap   Other Stretch eversion stretch, with strap assist in modified pigeon pose on table, 5 reps - 5-10 sec each leg.  cues for form and to hold stretch longer.      Ankle Exercises: Aerobic   Nustep L4: legs only, for warm up x 5 min    Other Aerobic single laps around gym in between exercises to decrease stiffness.      Ankle Exercises: Standing   Other Standing Ankle Exercises sit to stand x 8 reps with cues to shift weight more to RLE            Balance Exercises - 12/28/20 0001       Balance Exercises: Standing   SLS Eyes open;Solid surface;2 reps;15 secs;Upper extremity support 2    Partial Tandem Stance Eyes open;1 rep;20 secs;Intermittent upper extremity support    Sidestepping 2 reps;Upper extremity support    Other Standing Exercises toe taps to stacked yoga blocks holding rollator x 12.                     PT Long Term Goals - 12/25/20 1250       PT LONG TERM GOAL #1   Title Pt will be independent in HEP    Time 6    Period Weeks    Status New    Target Date 02/05/21      PT LONG TERM GOAL #2   Title Pt will improve Berg balance score to >= 45/56 to demo improved balance and decreased risk of falls    Time 6    Period Weeks    Status New    Target Date 02/05/21      PT LONG TERM GOAL #3   Title Pt will improve TUG to <= 21 seconds to demo improved balance and decreased fall risk    Time  6    Period Weeks    Status New    Target Date 02/05/21      PT  LONG TERM GOAL #4   Title Pt will tolerate standing x 20 minutes with pain <= 2/10    Time 6    Period Weeks    Status New    Target Date 02/05/21                   Plan - 12/28/20 1156     Clinical Impression Statement Pt reported some soreness in Rt knee with standing exercises.  Pt given cues to correct Rt foot from ER with side stepping.  Modified seated fig 4 for ankle eversion stretch to a modified pigeon pose with leg supported on table, with good tolerance.  Goals are ongoing.    Personal Factors and Comorbidities Comorbidity 3+    Examination-Activity Limitations Stand;Stairs;Locomotion Level    Examination-Participation Restrictions Community Activity;Cleaning;Meal Prep    Stability/Clinical Decision Making Evolving/Moderate complexity    Rehab Potential Good    PT Frequency 2x / week    PT Duration 6 weeks    PT Treatment/Interventions Aquatic Therapy;Cryotherapy;Moist Heat;Iontophoresis 4mg /ml Dexamethasone;Electrical Stimulation;Gait training;Stair training;Neuromuscular re-education;Balance training;Therapeutic exercise;Therapeutic activities;Patient/family education;Manual techniques;Dry needling;Taping;Vasopneumatic Device    PT Next Visit Plan progress balance exercises and ankle and LE strengthening    PT Home Exercise Plan [email or web address]    Consulted and Agree with Plan of Care Patient             Patient will benefit from skilled therapeutic intervention in order to improve the following deficits and impairments:  Pain, Decreased strength, Decreased activity tolerance, Decreased range of motion, Hypomobility, Difficulty walking, Decreased balance  Visit Diagnosis: Balance problems  Pain in right ankle and joints of right foot  Difficulty in walking, not elsewhere classified     Problem List Patient Active Problem List   Diagnosis Date Noted   Atypical squamous cell changes of undetermined significance (ASCUS) on cervical cytology with positive high  risk human papilloma virus (HPV) 12/17/2020   Acute non-recurrent pansinusitis 07/24/2020   H/O total shoulder replacement, 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   Sarcoidosis 12/11/2018   Lymphadenopathy of head and neck 11/09/2018   Primary osteoarthritis of right knee 12/31/2015   Spinal stenosis of lumbar region 12/31/2015   S/P TKR (total knee replacement), right 12/31/2015   Diverticulitis of intestine without perforation or abscess without bleeding 09/18/2013   Encounter for preventive care 06/28/2013   Recurrent sinusitis 02/05/2013   Overactive bladder 01/04/2013   Onychomycosis 09/19/2012   Right bundle branch block 03/07/2012   Anemia 03/03/2011   Rosacea 10/30/2010   THYROID NODULE, RIGHT 09/10/2008   THYROID STIMULATING HORMONE, ABNORMAL 09/08/2008   Vitamin D deficiency 09/08/2008   ALKALINE PHOSPHATASE, ELEVATED 09/08/2008   OSTEOPOROSIS 08/21/2008   BARIATRIC SURGERY STATUS 08/21/2008   MORBID OBESITY 02/27/2007   Anxiety state 02/27/2007   Essential hypertension 02/27/2007   HEMORRHOIDS 02/27/2007   VENOUS INSUFFICIENCY 02/27/2007   Allergic rhinitis 02/27/2007   GERD 02/27/2007   DEGENERATIVE JOINT DISEASE 02/27/2007    Kerin Perna, PTA 12/28/20 12:05 PM   [facility] [place] [address] [place], [place], [address] Phone: [phone]   Fax:  [phone]  Name: [name] [name] MRN: [id number] Date of Birth: [birth year]

## 2020-12-30 ENCOUNTER — Encounter: Payer: Self-pay | Admitting: Physical Therapy

## 2020-12-30 ENCOUNTER — Ambulatory Visit (INDEPENDENT_AMBULATORY_CARE_PROVIDER_SITE_OTHER): Payer: 59 | Admitting: Physical Therapy

## 2020-12-30 ENCOUNTER — Other Ambulatory Visit: Payer: Self-pay

## 2020-12-30 DIAGNOSIS — M25571 Pain in right ankle and joints of right foot: Secondary | ICD-10-CM

## 2020-12-30 DIAGNOSIS — R2689 Other abnormalities of gait and mobility: Secondary | ICD-10-CM

## 2020-12-30 DIAGNOSIS — R262 Difficulty in walking, not elsewhere classified: Secondary | ICD-10-CM

## 2020-12-30 NOTE — Therapy (Signed)
[place], [place], [address] Phone: [phone]   Fax:  [phone]  Physical Therapy Treatment  Patient Details  Name: [name] [name] MRN: [id number] Date of Birth: 1955/12/12 Referring Provider (PT): Wylene Simmer   Encounter Date: 12/30/2020   PT End of Session - 12/30/20 1114     Visit Number 3    Number of Visits 12    Date for PT Re-Evaluation 02/05/21    PT Start Time 1019    PT Stop Time 1100    PT Time Calculation (min) 41 min    Activity Tolerance Patient tolerated treatment well    Behavior During Therapy [facility] for tasks assessed/performed             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   Elevated alkaline phosphatase level    negative workup (presumed secondary to fatty liver)   Frequency-urgency syndrome    GERD (gastroesophageal reflux disease)    none since weight loss surgery   Interstitial cystitis    Dr Vernie Shanks   Lymphadenopathy of head and neck 11/09/2018   Neuromuscular disorder ([place])    rt arm carpal tunnel syndrome   Obesity    status post bariatric procedure in [place] 2005   Osteoporosis    Sarcoidosis of lung (HCC)    Thyroid nodule 7/10   `   Thyroid nodule    Varicose vein of leg     Past Surgical History:  Procedure Laterality Date   ANTERIOR CERVICAL DECOMP/DISCECTOMY FUSION N/A 06/21/2012   Procedure: ANTERIOR CERVICAL DECOMPRESSION/DISCECTOMY FUSION C-4 - C5 (SPACER/DePUY CERVICAL PLATE ONLY) 1 LEVEL;  Surgeon: Melina Schools, MD;  Location: [place];  Service: Orthopedics;  Laterality: N/A;   BLADDER SURGERY  02/21/12 and 02/28/12   Dr Diona Fanti   COLPOSCOPY  1980   CRYOTHERAPY  1980   CYSTO WITH HYDRODISTENSION  12/12/2011   Procedure: CYSTOSCOPY/HYDRODISTENSION;  Surgeon: Franchot Gallo, MD;  Location[facility];  Service: Urology;  Laterality: N/A;  [place]   bilateral cataract extraction   FOOT SURGERY     bilateral bunionettes   FOOT SURGERY Right 01/07/14   Pt states surgery was due to arthritis. "Has pins in place now"   FOREIGN BODY REMOVAL Right 09/27/2019   Procedure: REMOVAL FOREIGN BOD RIGHT FOOT;  Surgeon: Netta Cedars, MD;  [facility];  Service: Orthopedics;  Laterality: Right;   FRACTURE SURGERY Left 2019   wrist   GASTRIC BYPASS  2005   JOINT REPLACEMENT  2008   left total knee   KNEE SURGERY  1997   left knee   LYMPH NODE DISSECTION Right 11/09/2018   Procedure: EXCISE DEEP RIGHT CERVICAL LYMPH NODE;  Surgeon: Fanny Skates, MD;  Location: [place];  Service: General;  Laterality: Right;   NASAL SINUS SURGERY  8185   Dr [facility]   REFRACTIVE SURGERY  2006   lasik   REVERSE SHOULDER ARTHROPLASTY Left 09/27/2019   Procedure: REVERSE SHOULDER ARTHROPLASTY;  Surgeon: Netta Cedars, MD;  [facility];  Service: Orthopedics;  Laterality: Left;  2 hrs General with intrascalene block   SHOULDER SURGERY  2 2012   RIGHT    STOMACH SURGERY  2007   "tummy  tuck"   TOTAL KNEE ARTHROPLASTY Right 12/31/2015   Procedure: RIGHT TOTAL KNEE ARTHROPLASTY;  Surgeon: [name], MD;  [facility];  Service: Orthopedics;  Laterality: Right;  Adductor Block   VARICOSE VEIN SURGERY Right 02/2012   leg    There were no vitals filed for this visit.   Subjective Assessment - 12/30/20 1020     Subjective Pt reports that her Rt ankle is not as sore now that she is wearing compression (15/70mmg) socks.  Her Rt knee is sore from the exercises and running errands.    Pertinent History bilat knee replacements, ACDF    Patient Stated Goals decrease pain, improve balance    Currently in Pain? Yes    Pain Score 1     Pain Location Ankle    Pain Orientation Right    Pain Descriptors / Indicators Aching;Numbness    Aggravating Factors  standing,  walking    Pain Relieving Factors compression socks    Multiple Pain Sites Yes    Pain Score 2    Pain Location Knee    Pain Orientation Right    Pain Descriptors / Indicators Sore    Aggravating Factors  using LE    Pain Relieving Factors sitting with LE propped up, ice.                [facility] PT Assessment - 12/30/20 0001       Assessment   Medical Diagnosis primary OA of  ankle and foot    Referring Provider (PT) Wylene Simmer    Onset Date/Surgical Date 08/20/20    Hand Dominance Right    Next MD Visit 02/2021    Prior Therapy rehab for Rt femur fracture      Flexibility   Soft Tissue Assessment /Muscle Length yes    Quadriceps RLE: 75 (after 3rd quad stretch), LLE 71              [facility] Adult PT Treatment/Exercise - 12/30/20 0001       Exercises   Exercises Knee/Hip      Knee/Hip Exercises: Stretches   Sports administrator Right;3 reps;Left;2 reps;20 seconds   prone with strap   Gastroc Stretch Right;Left;2 reps;20 seconds   runners stretch     Knee/Hip Exercises: Standing   Forward Step Up Right;Left;1 set;10 reps;Hand Hold: 2;Step Height: 6"   and retro step down   Other Standing Knee Exercises side stepping R/L at counter x 10 ft x 2 reps    Other Standing Knee Exercises forward marching and retro gait x 10 ft x 2 with light UE support on counter.  Foot taps to 6" step x 12 reps with light UE support.      Ankle Exercises: Seated   Heel Raises Both;10 reps    Toe Raise 10 reps    BAPS Level 4;Sitting;10 reps   4 too large, therapist assisting with coordination of motion for CW/CCW   Other Seated Ankle Exercises resisted bilat eversion with yellow band x 15, red band x 15.  Resisted DF with red band x 10 each foot.      Ankle Exercises: Standing   SLS R/L SLS at counter with light UE support.    Heel Raises Both;10 reps;2 seconds      Ankle Exercises: Stretches   Gastroc Stretch 2 reps;20 seconds   runners stretch at counter              PT Long  Term Goals - 12/30/20  [place] #1   Title Pt will be independent in HEP    Time 6    Period Weeks    Status On-going      PT LONG TERM GOAL #2   Title Pt will improve Berg balance score to >= 45/56 to demo improved balance and decreased risk of falls    Time 6    Period Weeks    Status On-going      PT LONG TERM GOAL #3   Title Pt will improve TUG to <= 21 seconds to demo improved balance and decreased fall risk    Time 6    Period Weeks    Status On-going      PT LONG TERM GOAL #4   Title Pt will tolerate standing x 20 minutes with pain <= 2/10    Time 6    Period Weeks    Status On-going                   Plan - 12/30/20 1115     Clinical Impression Statement Pt tolerated standing and seated exercises well without increase in soreness.  Rt quad muscle tight; prone quad stretch tolerated well - improved from 45 to 74 with 3 repetitions.  Progressing well towards LTGs.    Personal Factors and Comorbidities Comorbidity 3+    Examination-Activity Limitations Stand;Stairs;Locomotion Level    Examination-Participation Restrictions Community Activity;Cleaning;Meal Prep    Stability/Clinical Decision Making Evolving/Moderate complexity    Rehab Potential Good    PT Frequency 2x / week    PT Duration 6 weeks    PT Treatment/Interventions Aquatic Therapy;Cryotherapy;Moist Heat;Iontophoresis 4mg /ml Dexamethasone;Electrical Stimulation;Gait training;Stair training;Neuromuscular re-education;Balance training;Therapeutic exercise;Therapeutic activities;Patient/family education;Manual techniques;Dry needling;Taping;Vasopneumatic Device    PT Next Visit Plan progress balance exercises and ankle and LE strengthening    PT Home Exercise Plan [id number]    Consulted and Agree with Plan of Care Patient             Patient will benefit from skilled therapeutic intervention in order to improve the following deficits and impairments:  Pain, Decreased strength,  Decreased activity tolerance, Decreased range of motion, Hypomobility, Difficulty walking, Decreased balance  Visit Diagnosis: Balance problems  Pain in right ankle and joints of right foot  Difficulty in walking, not elsewhere classified     Problem List Patient Active Problem List   Diagnosis Date Noted   Atypical squamous cell changes of undetermined significance (ASCUS) on cervical cytology with positive 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) 12/17/2020   Acute non-recurrent pansinusitis 07/24/2020   H/O total shoulder replacement, 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   Sarcoidosis 12/11/2018   Lymphadenopathy of head and neck 11/09/2018   Primary osteoarthritis of right knee 12/31/2015   Spinal stenosis of lumbar region 12/31/2015   S/P TKR (total knee replacement), right 12/31/2015   Diverticulitis of intestine without perforation or abscess without bleeding 09/18/2013   Encounter for preventive care 06/28/2013   Recurrent sinusitis 02/05/2013   Overactive bladder 01/04/2013   Onychomycosis 09/19/2012   Right bundle branch block 03/07/2012   Anemia 03/03/2011   Rosacea 10/30/2010   THYROID NODULE, RIGHT 09/10/2008   THYROID STIMULATING HORMONE, ABNORMAL 09/08/2008   Vitamin D deficiency 09/08/2008   ALKALINE PHOSPHATASE, ELEVATED 09/08/2008   OSTEOPOROSIS 08/21/2008   BARIATRIC SURGERY STATUS 08/21/2008   MORBID OBESITY 02/27/2007   Anxiety state 02/27/2007   Essential hypertension 02/27/2007   HEMORRHOIDS 02/27/2007   VENOUS INSUFFICIENCY 02/27/2007  Allergic rhinitis 02/27/2007   GERD 02/27/2007   DEGENERATIVE JOINT DISEASE 02/27/2007   Kerin Perna, PTA 12/30/20 11:32 AM  [place], [place], [address] Phone: [phone]   Fax:  [phone]  Name: MARLENE BEIDLER MRN: [id number] Date of Birth: 1955/10/20

## 2020-12-31 ENCOUNTER — Other Ambulatory Visit (HOSPITAL_BASED_OUTPATIENT_CLINIC_OR_DEPARTMENT_OTHER): Payer: Self-pay | Admitting: Family

## 2020-12-31 DIAGNOSIS — M8580 Other specified disorders of bone density and structure, unspecified site: Secondary | ICD-10-CM

## 2021-01-04 ENCOUNTER — Ambulatory Visit: Payer: BLUE CROSS/BLUE SHIELD | Admitting: Obstetrics and Gynecology

## 2021-01-05 ENCOUNTER — Other Ambulatory Visit: Payer: Self-pay

## 2021-01-05 ENCOUNTER — Ambulatory Visit (INDEPENDENT_AMBULATORY_CARE_PROVIDER_SITE_OTHER): Payer: 59 | Admitting: Physical Therapy

## 2021-01-05 DIAGNOSIS — M25571 Pain in right ankle and joints of right foot: Secondary | ICD-10-CM

## 2021-01-05 DIAGNOSIS — R262 Difficulty in walking, not elsewhere classified: Secondary | ICD-10-CM

## 2021-01-05 DIAGNOSIS — R2689 Other abnormalities of gait and mobility: Secondary | ICD-10-CM

## 2021-01-05 NOTE — Therapy (Addendum)
[place], [place], [address] Phone: [phone]   Fax:  [phone]  Physical Therapy Treatment and Discharge  Patient Details  Name: [name] [name] MRN: [id number] Date of Birth: 22-Sep-1955 Referring Provider (PT): Wylene Simmer   Encounter Date: 01/05/2021   PT End of Session - 01/05/21 0931     Visit Number 4    Number of Visits 12    Date for PT Re-Evaluation 02/05/21    PT Start Time 0849    PT Stop Time 0932    PT Time Calculation (min) 43 min    Activity Tolerance Patient tolerated treatment well    Behavior During Therapy [facility] for tasks assessed/performed             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   Elevated alkaline phosphatase level    negative workup (presumed secondary to fatty liver)   Frequency-urgency syndrome    GERD (gastroesophageal reflux disease)    none since weight loss surgery   Interstitial cystitis    Dr Vernie Shanks   Lymphadenopathy of head and neck 11/09/2018   Neuromuscular disorder ([place])    rt arm carpal tunnel syndrome   Obesity    status post bariatric procedure in [place] 2005   Osteoporosis    Sarcoidosis of lung (HCC)    Thyroid nodule 7/10   `   Thyroid nodule    Varicose vein of leg     Past Surgical History:  Procedure Laterality Date   ANTERIOR CERVICAL DECOMP/DISCECTOMY FUSION N/A 06/21/2012   Procedure: ANTERIOR CERVICAL DECOMPRESSION/DISCECTOMY FUSION C-4 - C5 (SPACER/DePUY CERVICAL PLATE ONLY) 1 LEVEL;  Surgeon: Melina Schools, MD;  Location: [place];  Service: Orthopedics;  Laterality: N/A;   BLADDER SURGERY  02/21/12 and 02/28/12   Dr Diona Fanti   COLPOSCOPY  1980   CRYOTHERAPY  1980   CYSTO WITH HYDRODISTENSION  12/12/2011   Procedure: CYSTOSCOPY/HYDRODISTENSION;  Surgeon: Franchot Gallo, MD;   Location[facility];  Service: Urology;  Laterality: N/A;  [place]   bilateral cataract extraction   FOOT SURGERY     bilateral bunionettes   FOOT SURGERY Right 01/07/14   Pt states surgery was due to arthritis. "Has pins in place now"   FOREIGN BODY REMOVAL Right 09/27/2019   Procedure: REMOVAL FOREIGN BOD RIGHT FOOT;  Surgeon: Netta Cedars, MD;  [facility];  Service: Orthopedics;  Laterality: Right;   FRACTURE SURGERY Left 2019   wrist   GASTRIC BYPASS  2005   JOINT REPLACEMENT  2008   left total knee   KNEE SURGERY  1997   left knee   LYMPH NODE DISSECTION Right 11/09/2018   Procedure: EXCISE DEEP RIGHT CERVICAL LYMPH NODE;  Surgeon: Fanny Skates, MD;  Location: [place];  Service: General;  Laterality: Right;   NASAL SINUS SURGERY  8315   Dr [facility]   REFRACTIVE SURGERY  2006   lasik   REVERSE SHOULDER ARTHROPLASTY Left 09/27/2019   Procedure: REVERSE SHOULDER ARTHROPLASTY;  Surgeon: Netta Cedars, MD;  [facility];  Service: Orthopedics;  Laterality: Left;  2 hrs General with intrascalene block   SHOULDER SURGERY  2 2012   RIGHT    STOMACH SURGERY  2007   "  tummy tuck"   TOTAL KNEE ARTHROPLASTY Right 12/31/2015   Procedure: RIGHT TOTAL KNEE ARTHROPLASTY;  Surgeon: [name], MD;  [facility];  Service: Orthopedics;  Laterality: Right;  Adductor Block   VARICOSE VEIN SURGERY Right 02/2012   leg    There were no vitals filed for this visit.   Subjective Assessment - 01/05/21 0856     Subjective Pt reports that she did some shopping (3hrs) this weekend -"I was up on my feet for a long time".  She states she has been walking short distances around the house without her walker.  No falls.    Currently in Pain? No/denies                [facility] PT Assessment - 01/05/21 0001       Assessment   Medical Diagnosis primary OA of  ankle and foot    Referring Provider (PT) Wylene Simmer    Onset Date/Surgical Date  08/20/20    Hand Dominance Right    Next MD Visit 02/2021    Prior Therapy rehab for Rt femur fracture              [facility] Adult PT Treatment/Exercise - 01/05/21 0001       Knee/Hip Exercises: Stretches   Sports administrator Right;3 reps;Left;2 reps;20 seconds   prone with strap   Gastroc Stretch Right;Left;2 reps;20 seconds   runners stretch     Knee/Hip Exercises: Aerobic   Stepper L5: 6 min for warm up.      Knee/Hip Exercises: Standing   Other Standing Knee Exercises side stepping R/L at counter x 10 ft x 3 reps    Other Standing Knee Exercises forward high knee marching with rollator      Knee/Hip Exercises: Supine   Bridges 1 set;10 reps    Straight Leg Raises Right;2 sets;5 reps      Ankle Exercises: Seated   Other Seated Ankle Exercises resisted bilat eversion with yellow band x 15, red band x 15.  Resisted DF with red band x 10 each foot.      Ankle Exercises: Standing   Heel Raises Both;5 reps   2 sets              PT Long Term Goals - 12/30/20 1126       PT LONG TERM GOAL #1   Title Pt will be independent in HEP    Time 6    Period Weeks    Status On-going      PT LONG TERM GOAL #2   Title Pt will improve Berg balance score to >= 45/56 to demo improved balance and decreased risk of falls    Time 6    Period Weeks    Status On-going      PT LONG TERM GOAL #3   Title Pt will improve TUG to <= 21 seconds to demo improved balance and decreased fall risk    Time 6    Period Weeks    Status On-going      PT LONG TERM GOAL #4   Title Pt will tolerate standing x 20 minutes with pain <= 2/10    Time 6    Period Weeks    Status On-going                   Plan - 01/05/21 0932     Clinical Impression Statement Pt's Rt hip fatigues quickly with SLR; can only tolerate 4-5 reps at  a time.  Overall tolerated exercises well and is making good progress towards LTGs.    Personal Factors and Comorbidities Comorbidity 3+    Examination-Activity  Limitations Stand;Stairs;Locomotion Level    Examination-Participation Restrictions Community Activity;Cleaning;Meal Prep    Stability/Clinical Decision Making Evolving/Moderate complexity    Rehab Potential Good    PT Frequency 2x / week    PT Duration 6 weeks    PT Treatment/Interventions Aquatic Therapy;Cryotherapy;Moist Heat;Iontophoresis 31m/ml Dexamethasone;Electrical Stimulation;Gait training;Stair training;Neuromuscular re-education;Balance training;Therapeutic exercise;Therapeutic activities;Patient/family education;Manual techniques;Dry needling;Taping;Vasopneumatic Device    PT Next Visit Plan progress balance exercises and ankle and LE strengthening.  Measure ankle ROM.    PT Home Exercise Plan [email or web address]   Consulted and Agree with Plan of Care Patient             Patient will benefit from skilled therapeutic intervention in order to improve the following deficits and impairments:  Pain, Decreased strength, Decreased activity tolerance, Decreased range of motion, Hypomobility, Difficulty walking, Decreased balance  Visit Diagnosis: Balance problems  Pain in right ankle and joints of right foot  Difficulty in walking, not elsewhere classified     Problem List Patient Active Problem List   Diagnosis Date Noted   Atypical squamous cell changes of undetermined significance (ASCUS) on cervical cytology with positive 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) 12/17/2020   Acute non-recurrent pansinusitis 07/24/2020   H/O total shoulder replacement, 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   Sarcoidosis 12/11/2018   Lymphadenopathy of head and neck 11/09/2018   Primary osteoarthritis of right knee 12/31/2015   Spinal stenosis of lumbar region 12/31/2015   S/P TKR (total knee replacement), right 12/31/2015   Diverticulitis of intestine without perforation or abscess without bleeding 09/18/2013   Encounter for preventive care 06/28/2013   Recurrent sinusitis 02/05/2013   Overactive bladder 01/04/2013    Onychomycosis 09/19/2012   Right bundle branch block 03/07/2012   Anemia 03/03/2011   Rosacea 10/30/2010   THYROID NODULE, RIGHT 09/10/2008   THYROID STIMULATING HORMONE, ABNORMAL 09/08/2008   Vitamin D deficiency 09/08/2008   ALKALINE PHOSPHATASE, ELEVATED 09/08/2008   OSTEOPOROSIS 08/21/2008   BARIATRIC SURGERY STATUS 08/21/2008   MORBID OBESITY 02/27/2007   Anxiety state 02/27/2007   Essential hypertension 02/27/2007   HEMORRHOIDS 02/27/2007   VENOUS INSUFFICIENCY 02/27/2007   Allergic rhinitis 02/27/2007   GERD 02/27/2007   DEGENERATIVE JOINT DISEASE 02/27/2007  PHYSICAL THERAPY DISCHARGE SUMMARY  Visits from Start of Care: 4  Current functional level related to goals / functional outcomes: Improving strength and mobility   Remaining deficits: See above   Education / Equipment: HEP   Patient agrees to discharge. Patient goals were partially met. Patient is being discharged due to financial reasons. KIsabelle Course PT,DPT01/05/2310:40 PM   JKerin Perna PTA 01/05/21 9:36 AM   [facility] [address]Phone: [phone]  Fax:  [phone] Name: NCHANI GHANEMMRN: [id number] of Birth: 3Jun 20, 1957

## 2021-01-11 ENCOUNTER — Ambulatory Visit (HOSPITAL_BASED_OUTPATIENT_CLINIC_OR_DEPARTMENT_OTHER)
Admission: RE | Admit: 2021-01-11 | Discharge: 2021-01-11 | Disposition: A | Discharge status: Home / Self Care | Payer: 59 | Source: Ambulatory Visit | Attending: Family | Admitting: Family

## 2021-01-11 ENCOUNTER — Encounter: Payer: Self-pay | Admitting: Family

## 2021-01-11 ENCOUNTER — Telehealth: Payer: Self-pay | Admitting: Family

## 2021-01-11 ENCOUNTER — Encounter: Payer: Self-pay | Admitting: Obstetrics & Gynecology

## 2021-01-11 ENCOUNTER — Other Ambulatory Visit: Payer: Self-pay

## 2021-01-11 ENCOUNTER — Other Ambulatory Visit (HOSPITAL_COMMUNITY)
Admission: RE | Admit: 2021-01-11 | Discharge: 2021-01-11 | Disposition: A | Discharge status: Home / Self Care | Payer: 59 | Source: Ambulatory Visit | Attending: Obstetrics & Gynecology | Admitting: Obstetrics & Gynecology

## 2021-01-11 ENCOUNTER — Ambulatory Visit: Payer: 59 | Admitting: Obstetrics & Gynecology

## 2021-01-11 VITALS — BP 139/73 | HR 72 | Ht 63.5 in | Wt 218.0 lb

## 2021-01-11 DIAGNOSIS — R87610 Atypical squamous cells of undetermined significance on cytologic smear of cervix (ASC-US): Secondary | ICD-10-CM | POA: Insufficient documentation

## 2021-01-11 DIAGNOSIS — Z1231 Encounter for screening mammogram for malignant neoplasm of breast: Secondary | ICD-10-CM

## 2021-01-11 DIAGNOSIS — M8580 Other specified disorders of bone density and structure, unspecified site: Secondary | ICD-10-CM | POA: Diagnosis present

## 2021-01-11 DIAGNOSIS — R87810 Cervical high risk human papillomavirus (HPV) DNA test positive: Secondary | ICD-10-CM

## 2021-01-11 MED ORDER — PILOCARPINE HCL 5 MG PO TABS: ORAL_TABLET | ORAL | 5 refills | Status: DC

## 2021-01-11 MED ORDER — ALPRAZOLAM 0.5 MG PO TABS: ORAL_TABLET | ORAL | 0 refills | Status: DC

## 2021-01-11 MED ORDER — PAROXETINE HCL 40 MG PO TABS: ORAL_TABLET | ORAL | 1 refills | Status: DC

## 2021-01-11 NOTE — Telephone Encounter (Signed)
Please advise pt that her bone density is showing osteoporosis. I know she already took fosamax for 5 years. I would recommend prolia injection every 6 months. Can we please work on getting this authorized by her insurance.   Continue caltrate 600 + D twice daily + vitamin D supplement.

## 2021-01-11 NOTE — Progress Notes (Signed)
Colposcopy Procedure Note  Indications: Pap smear 1 months ago showed: ASCUS with POSITIVE high risk HPV. The prior pap showed  normal cytology and +HPV .  Prior cervical/vaginal disease: LEEP at age 64.  Procedure Details  The risks and benefits of the procedure and Written informed consent obtained.  Speculum placed in vagina and excellent visualization of cervix achieved, cervix swabbed x 3 with acetic acid solution.  Findings: Cervix: acetowhite lesion(s) noted at 12 and 6 o'clock; cervix swabbed with Lugol's solution, no biopsies taken, cervical biopsies taken at 12 & 6 o'clock, specimen labelled and sent to pathology, hemostasis achieved with Monsel's solution, and hemostasis achieved with silver nitrate.  Lesion at 12 o'clock goes into the endocervical canal. Vaginal inspection: vaginal colposcopy not performed. Vulvar colposcopy: vulvar colposcopy not performed.  Specimens: 12 & 6 o'clock, ECC  Complications: none.  Plan: Specimens labelled and sent to Pathology. Will base further treatment on Pathology findings.

## 2021-01-11 NOTE — Telephone Encounter (Signed)
Clinical information submitted to Insurance for Prolia waiting on summary of benefits. Will call when received.  

## 2021-01-11 NOTE — Progress Notes (Signed)
   Subjective:    Patient ID: [name] [name], female    DOB: 08-Jun-1955, 65 y.o.   MRN: [id number]  HPI  65 year old female referred for colposcopy.  Her history of Pap smears was reviewed and is detailed in the colposcopy note.  Patient is new to our practice.  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  Patient is menopausal.  She is not having any bleeding.  She does have a history of a recent femur fracture so a speculum exam in dorsolithotomy is difficult for positioning  Review of Systems  Constitutional: Negative.   Respiratory: Negative.    Cardiovascular: Negative.   Gastrointestinal: Negative.   Genitourinary: Negative.       Objective:   Physical Exam Vitals reviewed.  Constitutional:      General: She is not in acute distress.    Appearance: She is well-developed.  HENT:     Head: Normocephalic and atraumatic.  Eyes:     Conjunctiva/sclera: Conjunctivae normal.  Cardiovascular:     Rate and Rhythm: Normal rate.  Pulmonary:     Effort: Pulmonary effort is normal.  Skin:    General: Skin is warm and dry.  Neurological:     Mental Status: She is alert and oriented to person, place, and time.  Psychiatric:        Mood and Affect: Mood normal.  Speculum exam details are in following note.  Vitals:   01/11/21 1316  BP: 139/73  Pulse: 72  Weight: 218 lb (98.9 kg)  Height: 5' 3.5" (1.613 m)          Assessment & Plan:   65 year old female with ASCUS and HPV positive for colposcopy.

## 2021-01-11 NOTE — Telephone Encounter (Signed)
Requesting: alprazolam 0.5mg  Contract: 12/14/2020 UDS: 12/14/2020 Last Visit: 12/14/2020 Next Visit: 06/14/2021 Last Refill: 12/14/2020 #60 and 0RF  Please Advise

## 2021-01-12 ENCOUNTER — Encounter: Payer: 59 | Admitting: Physical Therapy

## 2021-01-14 LAB — SURGICAL PATHOLOGY

## 2021-01-15 ENCOUNTER — Encounter: Payer: 59 | Admitting: Physical Therapy

## 2021-01-18 ENCOUNTER — Encounter: Payer: Self-pay | Admitting: Obstetrics & Gynecology

## 2021-01-18 DIAGNOSIS — N870 Mild cervical dysplasia: Secondary | ICD-10-CM | POA: Insufficient documentation

## 2021-01-19 ENCOUNTER — Encounter: Payer: 59 | Admitting: Physical Therapy

## 2021-01-22 ENCOUNTER — Encounter: Payer: 59 | Admitting: Physical Therapy

## 2021-01-26 ENCOUNTER — Encounter: Payer: 59 | Admitting: Physical Therapy

## 2021-01-26 ENCOUNTER — Encounter: Payer: Self-pay | Admitting: Family

## 2021-01-26 NOTE — Telephone Encounter (Signed)
Was this ever confirmed? Patient is asking if she can schedule.

## 2021-01-29 ENCOUNTER — Encounter: Payer: 59 | Admitting: Physical Therapy

## 2021-02-02 ENCOUNTER — Encounter: Payer: 59 | Admitting: Physical Therapy

## 2021-02-02 ENCOUNTER — Encounter: Payer: Self-pay | Admitting: Family

## 2021-02-03 MED ORDER — ALPRAZOLAM 1 MG PO TABS: 1.0000 mg | ORAL_TABLET | Freq: Every evening | ORAL | 0 refills | Status: DC | PRN

## 2021-02-03 NOTE — Addendum Note (Signed)
Addended by: [name] on: 02/03/2021 08:18 AM   Modules accepted: Orders

## 2021-02-05 ENCOUNTER — Encounter: Payer: 59 | Admitting: Physical Therapy

## 2021-02-08 ENCOUNTER — Encounter: Payer: Self-pay | Admitting: Family

## 2021-02-09 ENCOUNTER — Other Ambulatory Visit: Payer: Self-pay | Admitting: Family

## 2021-02-09 ENCOUNTER — Encounter: Payer: 59 | Admitting: Physical Therapy

## 2021-02-09 MED ORDER — VALACYCLOVIR HCL 500 MG PO TABS: 500.0000 mg | ORAL_TABLET | Freq: Every day | ORAL | 1 refills | Status: DC

## 2021-02-09 MED ORDER — OMEPRAZOLE 40 MG PO CPDR: DELAYED_RELEASE_CAPSULE | ORAL | 1 refills | Status: DC

## 2021-02-12 ENCOUNTER — Encounter: Payer: 59 | Admitting: Physical Therapy

## 2021-02-12 ENCOUNTER — Telehealth (INDEPENDENT_AMBULATORY_CARE_PROVIDER_SITE_OTHER): Payer: 59 | Admitting: Medical

## 2021-02-12 DIAGNOSIS — J0100 Acute maxillary sinusitis, unspecified: Secondary | ICD-10-CM | POA: Diagnosis not present

## 2021-02-12 DIAGNOSIS — R490 Dysphonia: Secondary | ICD-10-CM

## 2021-02-12 DIAGNOSIS — R0981 Nasal congestion: Secondary | ICD-10-CM | POA: Diagnosis not present

## 2021-02-12 MED ORDER — BENZONATATE 100 MG PO CAPS: 100.0000 mg | ORAL_CAPSULE | Freq: Three times a day (TID) | ORAL | 0 refills | Status: DC | PRN

## 2021-02-12 MED ORDER — AMOXICILLIN-POT CLAVULANATE 875-125 MG PO TABS: 1.0000 | ORAL_TABLET | Freq: Two times a day (BID) | ORAL | 0 refills | Status: DC

## 2021-02-12 NOTE — Progress Notes (Signed)
° °  Subjective:    Patient ID: [name] [name], female    DOB: [birth year], 65 y.o.   MRN: [id number]  HPI  Virtual Visit via Video Note  I connected with [name] [name] on 02/12/21 at 11:20 AM EST by a video enabled telemedicine application and verified that I am speaking with the correct person using two identifiers.  Location: Patient: home Provider: office   I discussed the limitations of evaluation and management by telemedicine and the availability of in person appointments. The patient expressed understanding and agreed to proceed.  History of Present Illness: Pt states around christmas has been nasal congested and sneezing. She also states rt ear pressure, st, hoarse voice and sinus pressure. No fever, no chills or sweats. No sob. She is coughing. Cough is dry.  If blows nose will get some scant mucus.  She tested negative for covid.  Pt on allegra and flonase. Hx of severe allergies since high school.      Observations/Objective: General-no acute distress, pleasant, oriented. Hoarse.  Lungs- on inspection lungs appear unlabored. Neck- no tracheal deviation or jvd on inspection. Neuro- gross motor function appears intact.  Heent- frontal and maxillary sinus pressure on self palpation.  Assessment and Plan:  Patient Instructions  History of allergic rhinitis with intermittent sinus infections occurring  at various times throughout the year.  By patient's report  today sinus pressure with some right upper tooth.  We will ahead and prescribe Augmentin antibiotic.  Continue Flonase nasal spray.  Making benzonatate available for cough.  Upcoming 3-day weekend and I asked her to go ahead and retest for COVID today.  I will call in the event with positive result.  Evaluate could send in antiviral as we approach 3-day holiday weekend.  Explained to patient not to try to MyChart as sometimes late Friday afternoon MyChart messages do not get to me until next  week.  Follow-up in 7 days or sooner if needed.   Mackie Pai, PA-C  Follow Up Instructions:    I discussed the assessment and treatment plan with the patient. The patient was provided an opportunity to ask questions and all were answered. The patient agreed with the plan and demonstrated an understanding of the instructions.   The patient was advised to call back or seek an in-person evaluation if the symptoms worsen or if the condition fails to improve as anticipated.  Time spent with patient today was 24 minutes which consisted of chart revdiew, discussing diagnosis, work up treatment and documentation.    Mackie Pai, PA-C   Review of Systems     Objective:   Physical Exam        Assessment & Plan:

## 2021-02-12 NOTE — Patient Instructions (Signed)
History of allergic rhinitis with intermittent sinus infections occurring  at various times throughout the year.  By patient's report  today sinus pressure with some right upper tooth.  We will ahead and prescribe Augmentin antibiotic.  Continue Flonase nasal spray.  Making benzonatate available for cough.  Upcoming 3-day weekend and I asked her to go ahead and retest for COVID today.  I will call in the event with positive result.  Evaluate could send in antiviral as we approach 3-day holiday weekend.  Explained to patient not to try to MyChart as sometimes late Friday afternoon MyChart messages do not get to me until next week.  Follow-up in 7 days or sooner if needed.

## 2021-02-16 ENCOUNTER — Telehealth (INDEPENDENT_AMBULATORY_CARE_PROVIDER_SITE_OTHER): Payer: 59 | Admitting: Family

## 2021-02-16 DIAGNOSIS — F419 Anxiety disorder, unspecified: Secondary | ICD-10-CM

## 2021-02-16 DIAGNOSIS — G4700 Insomnia, unspecified: Secondary | ICD-10-CM | POA: Diagnosis not present

## 2021-02-16 MED ORDER — BUSPIRONE HCL 7.5 MG PO TABS: 7.5000 mg | ORAL_TABLET | Freq: Two times a day (BID) | ORAL | 1 refills | Status: DC

## 2021-02-16 MED ORDER — FLUCONAZOLE 150 MG PO TABS: ORAL_TABLET | ORAL | 0 refills | Status: DC

## 2021-02-16 NOTE — Patient Instructions (Signed)
Apply voltaren gel at bedtime to right foot and take 1000 mg of tylenol at bedtime. Start buspar twice daily for anxiety.

## 2021-02-16 NOTE — Progress Notes (Signed)
MyChart Video Visit    Virtual Visit via Video Note   This visit type was conducted due to national recommendations for restrictions regarding the COVID-19 Pandemic (e.g. social distancing) in an effort to limit this patient's exposure and mitigate transmission in our community. This patient is at least at moderate risk for complications without adequate follow up. This format is felt to be most appropriate for this patient at this time. Physical exam was limited by quality of the video and audio technology used for the visit. CMA was able to get the patient set up on a video visit.  Patient location: Home. Patient and provider in visit Provider location: Office  I discussed the limitations of evaluation and management by telemedicine and the availability of in person appointments. The patient expressed understanding and agreed to proceed.  Visit Date: 02/16/2021  Today's healthcare provider: Nance Pear, NP     Subjective:    Patient ID: [name] [name], female    DOB: 04-16-1955, 66 y.o.   MRN: [id number]  Chief Complaint  Patient presents with   Shoulder Pain    Complains of right shoulder pain   Leg Pain    Complains of right leg pain    Foot Pain    Complains of right foot pain   Anxiety    Complains of increased anxiety with the pain    HPI  Right shoulder pain- was just granted disability for her right shoulder pain.  She quit working in 06/2019.  Reports throbbing pain when she raises her arm.   She reports she is now walking following right femur fracture. Saw Dr. Doran Durand for her right foot pain.  She was treated with compression socks and Brooks shoes which are helping with her walking. Reports that she still has swelling in her right foot.  Has arthritis in her right foot.  States that the pain is debilitating.  She uses tylenol arthritis.  She reports that tylenol helps some. Not sleeping well due to right foot pain and then her anxiety worsens.  She  has tried zoloft and prozac in the past and they were not helpful for her. She has been on paxil 40 mg for many years.   On augmentin and is requesting rx for diflucan if needed.   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   Elevated alkaline phosphatase level    negative workup (presumed secondary to fatty liver)   Frequency-urgency syndrome    GERD (gastroesophageal reflux disease)    none since weight loss surgery   Interstitial cystitis    Dr Vernie Shanks   Lymphadenopathy of head and neck 11/09/2018   Neuromuscular disorder ([place])    rt arm carpal tunnel syndrome   Obesity    status post bariatric procedure in [place] 2005   Osteoporosis    Sarcoidosis of lung (HCC)    Thyroid nodule 7/10   `   Thyroid nodule    Varicose vein of leg     Past Surgical History:  Procedure Laterality Date   ANTERIOR CERVICAL DECOMP/DISCECTOMY FUSION N/A 06/21/2012   Procedure: ANTERIOR CERVICAL DECOMPRESSION/DISCECTOMY FUSION C-4 - C5 (SPACER/DePUY CERVICAL PLATE ONLY) 1 LEVEL;  Surgeon: Melina Schools, MD;  Location: [facility];  Service: Orthopedics;  Laterality: N/A;   BLADDER SURGERY  02/21/12 and  02/28/12   Dr Diona Fanti   COLPOSCOPY  1980   CRYOTHERAPY  1980   CYSTO WITH HYDRODISTENSION  12/12/2011   Procedure: CYSTOSCOPY/HYDRODISTENSION;  Surgeon: Franchot Gallo, MD;  Location: [facility];  Service: Urology;  Laterality: N/A;  [place]   bilateral cataract extraction   FOOT SURGERY     bilateral bunionettes   FOOT SURGERY Right 01/07/14   Pt states surgery was due to arthritis. "Has pins in place now"   FOREIGN BODY REMOVAL Right 09/27/2019   Procedure: REMOVAL FOREIGN BOD RIGHT FOOT;  Surgeon: Netta Cedars, MD;  Location: [facility];  Service: Orthopedics;  Laterality: Right;   FRACTURE SURGERY Left 2019   wrist   GASTRIC BYPASS   2005   JOINT REPLACEMENT  2008   left total knee   KNEE SURGERY  1997   left knee   LYMPH NODE DISSECTION Right 11/09/2018   Procedure: EXCISE DEEP RIGHT CERVICAL LYMPH NODE;  Surgeon: Fanny Skates, MD;  Location: [facility];  Service: General;  Laterality: Right;   NASAL SINUS SURGERY  6073   Dr [facility]   REFRACTIVE SURGERY  2006   lasik   REVERSE SHOULDER ARTHROPLASTY Left 09/27/2019   Procedure: REVERSE SHOULDER ARTHROPLASTY;  Surgeon: Netta Cedars, MD;  Location: [facility];  Service: Orthopedics;  Laterality: Left;  2 hrs General with intrascalene block   SHOULDER SURGERY  2 2012   RIGHT    STOMACH SURGERY  2007   "tummy tuck"   TOTAL KNEE ARTHROPLASTY Right 12/31/2015   Procedure: RIGHT TOTAL KNEE ARTHROPLASTY;  Surgeon: [name], MD;  Location: [facility];  Service: Orthopedics;  Laterality: Right;  Adductor Block   VARICOSE VEIN SURGERY Right 02/2012   leg    Family History  Problem Relation Age of Onset   Diabetes Paternal Grandfather    Stroke Paternal Grandfather    Hyperlipidemia Mother    Hypertension Mother    Hypertension Father    Diabetes Father    Prostate cancer Father    Dementia Father    Lung cancer Father    Heart disease Maternal Grandmother    Hypertension Maternal Grandmother    Heart disease Maternal Grandfather    Hypertension Maternal Grandfather    Stroke Maternal Grandfather    Arthritis Brother    Testicular cancer Brother    Colon cancer Neg Hx    Esophageal cancer Neg Hx    Pancreatic cancer Neg Hx    Stomach cancer Neg Hx     Social History   Socioeconomic History   Marital status: Significant Other    Spouse name: Not on file   Number of children: 1   Years of education: Not on file   Highest education level: Not on file  Occupational History   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[employer]  Tobacco Use   Smoking status: Never   Smokeless tobacco: Never  Vaping Use   Vaping Use: Never used  Substance and Sexual  Activity   Alcohol use: Not Currently    Comment: RARE   Drug use: No   Sexual activity: Yes    Comment: 1st intercourse 66 yo-More than 5 partners  Other Topics Concern   Not on file  Social History Narrative   Lives with her partner, daughter (Bipolar/schizophrenia), granddaughter   Retired at age 66   Social Determinants of Radio broadcast assistant Strain: Not on file  Food Insecurity:  Not on file  Transportation Needs: Not on file  Physical Activity: Not on file  Stress: Not on file  Social Connections: Not on file  Intimate Partner Violence: Not on file    Outpatient Medications Prior to Visit  Medication Sig Dispense Refill   acetaminophen (TYLENOL) 650 MG CR tablet Take 2 tablets (1,300 mg total) by mouth every 8 (eight) hours as needed for pain. Take for mild pain. Do not combine with Percocet. Do not exceed daily recommended limit of Tylenol.     ALPRAZolam (XANAX) 1 MG tablet Take 1 tablet (1 mg total) by mouth at bedtime as needed for anxiety. 30 tablet 0   Ascorbic Acid (VITAMIN C) 1000 MG tablet Take 1,000 mg by mouth at bedtime.     benzonatate (TESSALON) 100 MG capsule Take 1 capsule (100 mg total) by mouth 3 (three) times daily as needed for cough. 30 capsule 0   Calcium Carb-Cholecalciferol (CALTRATE 600+D3 PO) Take 1 tablet by mouth in the morning and at bedtime.     Cholecalciferol (VITAMIN D3) 50 MCG (2000 UT) TABS Take 4,000 Units by mouth at bedtime.     ferrous sulfate 325 (65 FE) MG tablet Take 325 mg by mouth in the morning and at bedtime.     fexofenadine (ALLEGRA) 180 MG tablet Take 180 mg by mouth at bedtime.     fluticasone (FLONASE) 50 MCG/ACT nasal spray Place 2 sprays into both nostrils daily. 16 g 1   Multiple Vitamin (MULTIVITAMIN WITH MINERALS) TABS tablet Take 1 tablet by mouth at bedtime.     MYRBETRIQ 50 MG TB24 tablet Take 50 mg by mouth at bedtime.   2   omeprazole (PRILOSEC) 40 MG capsule TAKE 1 CAPSULE BY MOUTH EVERY DAY 90 capsule 1    PARoxetine (PAXIL) 40 MG tablet TAKE ONE TABLET BY MOUTH EVERY MORNING 90 tablet 1   pilocarpine (SALAGEN) 5 MG tablet TAKE 1 TABLET BY MOUTH IN THE MORNING, AT NOON AND AT BEDTIME 60 tablet 5   sodium chloride (OCEAN) 0.65 % SOLN nasal spray Place 1-2 sprays into both nostrils 4 (four) times daily as needed for congestion.     valACYclovir (VALTREX) 500 MG tablet Take 1 tablet (500 mg total) by mouth daily. 90 tablet 1   zinc gluconate 50 MG tablet Take 50 mg by mouth at bedtime.      amoxicillin-clavulanate (AUGMENTIN) 875-125 MG tablet Take 1 tablet by mouth 2 (two) times daily. 20 tablet 0   No facility-administered medications prior to visit.    Allergies  Allergen Reactions   Nitrofurantoin     REACTION: fever   Ibuprofen Nausea Only    REACTION: Post gastric bypass-->can not take due to surgery.  No allergy. REACTION: Post gastric bypass-->can not take due to surgery.  No allergy.    ROS    See HPI Objective:    Physical Exam Constitutional:      Appearance: Normal appearance.  HENT:     Head: Normocephalic and atraumatic.  Pulmonary:     Effort: Pulmonary effort is normal.  Skin:    General: Skin is dry.  Neurological:     Mental Status: She is alert and oriented to person, place, and time.  Psychiatric:        Mood and Affect: Mood normal.        Behavior: Behavior normal.        Thought Content: Thought content normal.        Judgment: Judgment normal.  There were no vitals taken for this visit. Wt Readings from Last 3 Encounters:  01/11/21 218 lb (98.9 kg)  12/14/20 222 lb (100.7 kg)  07/24/20 231 lb 9.6 oz (105.1 kg)       Assessment & Plan:   Problem List Items Addressed This Visit       Unprioritized   Insomnia - Primary    She wakes up due to foot pain.  I recommended that she apply voltaren gel to foot prior to bedtime and take tylenol 1000mg  prior to bedtime to see if this helps to better control her foot pain and allow her to sleep through  the night.       Anxiety    Uncontrolled. Will add buspar 7.5mg  bid. Continue xanax 1mg  HS PRN.       Relevant Medications   busPIRone (BUSPAR) 7.5 MG tablet    I have discontinued Shareese T. Whitehead's amoxicillin-clavulanate. I am also having her start on busPIRone and fluconazole. Additionally, I am having her maintain her Myrbetriq, acetaminophen, zinc gluconate, multivitamin with minerals, Vitamin D3, vitamin C, ferrous sulfate, fexofenadine, Calcium Carb-Cholecalciferol (CALTRATE 600+D3 PO), sodium chloride, fluticasone, PARoxetine, pilocarpine, ALPRAZolam, omeprazole, valACYclovir, and benzonatate.  Meds ordered this encounter  Medications   busPIRone (BUSPAR) 7.5 MG tablet    Sig: Take 1 tablet (7.5 mg total) by mouth 2 (two) times daily.    Dispense:  60 tablet    Refill:  1    Order Specific Question:   Supervising Provider    Answer:   Penni Homans A [[id number]]   fluconazole (DIFLUCAN) 150 MG tablet    Sig: Take 1 tab by mouth as needed for vaginal yeast infection.    Dispense:  1 tablet    Refill:  0    Order Specific Question:   Supervising Provider    Answer:   Penni Homans A [[id number]]    I discussed the assessment and treatment plan with the patient. The patient was provided an opportunity to ask questions and all were answered. The patient agreed with the plan and demonstrated an understanding of the instructions.   The patient was advised to call back or seek an in-person evaluation if the symptoms worsen or if the condition fails to improve as anticipated. Nance Pear, NP [employer] [facility] [employer] [phone] (phone) [phone] (fax)  [place]

## 2021-02-17 DIAGNOSIS — G4700 Insomnia, unspecified: Secondary | ICD-10-CM | POA: Insufficient documentation

## 2021-02-17 NOTE — Assessment & Plan Note (Signed)
She wakes up due to foot pain.  I recommended that she apply voltaren gel to foot prior to bedtime and take tylenol 1000mg  prior to bedtime to see if this helps to better control her foot pain and allow her to sleep through the night.

## 2021-02-17 NOTE — Assessment & Plan Note (Addendum)
Uncontrolled. Will add buspar 7.5mg  bid. Continue paxil 40mg .  Continue xanax 1mg  HS PRN.

## 2021-03-02 MED ORDER — ALPRAZOLAM 1 MG PO TABS: 1.0000 mg | ORAL_TABLET | Freq: Every evening | ORAL | 0 refills | Status: DC | PRN

## 2021-03-02 NOTE — Addendum Note (Signed)
Addended by: [name] on: 03/02/2021 02:53 PM   Modules accepted: Orders

## 2021-03-03 ENCOUNTER — Other Ambulatory Visit: Payer: Self-pay | Admitting: Medical

## 2021-03-29 ENCOUNTER — Ambulatory Visit: Payer: 59 | Admitting: Family Medicine

## 2021-03-29 ENCOUNTER — Other Ambulatory Visit (HOSPITAL_BASED_OUTPATIENT_CLINIC_OR_DEPARTMENT_OTHER): Payer: Self-pay

## 2021-03-29 ENCOUNTER — Encounter: Payer: Self-pay | Admitting: Family Medicine

## 2021-03-29 VITALS — BP 146/71 | HR 75 | Ht 63.5 in | Wt 216.8 lb

## 2021-03-29 DIAGNOSIS — R399 Unspecified symptoms and signs involving the genitourinary system: Secondary | ICD-10-CM

## 2021-03-29 DIAGNOSIS — R300 Dysuria: Secondary | ICD-10-CM | POA: Diagnosis not present

## 2021-03-29 LAB — POC URINALSYSI DIPSTICK (AUTOMATED)
Bilirubin, UA: NEGATIVE
Blood, UA: NEGATIVE
Glucose, UA: NEGATIVE
Ketones, UA: NEGATIVE
Leukocytes, UA: NEGATIVE
Nitrite, UA: POSITIVE
Protein, UA: NEGATIVE
Spec Grav, UA: 1.01 (ref 1.010–1.025)
Urobilinogen, UA: 0.2 E.U./dL
pH, UA: 5 (ref 5.0–8.0)

## 2021-03-29 MED ORDER — SULFAMETHOXAZOLE-TRIMETHOPRIM 800-160 MG PO TABS
1.0000 | ORAL_TABLET | Freq: Two times a day (BID) | ORAL | 0 refills | Status: AC
  Filled 2021-03-29: qty 6, 3d supply, fill #0

## 2021-03-29 NOTE — Patient Instructions (Addendum)
UA with nitrites, but no leukocytes. We will send this off for culture. I will give you some Bactrim (antibiotic). If you can hold for another day or two, that would be ideal so we can get culture results back. If miserable, go ahead and start, and we will let you know if we need to make any changes based on culture results. Stay well hydrated! Hope you feel better soon!

## 2021-03-29 NOTE — Progress Notes (Signed)
Acute Office Visit  Subjective:    Patient ID: [name] [name], female    DOB: 12-15-1955, 66 y.o.   MRN: [id number]  CC: dysuria   Urinary Tract Infection  This is a new problem. The current episode started in the past 7 days. The problem occurs every urination. The problem has been gradually worsening. The quality of the pain is described as burning and aching. Pain scale: just uncomfortable. The pain is mild. There has been no fever. She is Sexually active. There is No history of pyelonephritis. Associated symptoms include frequency, hesitancy and urgency. Pertinent negatives include no chills, discharge, flank pain, hematuria, nausea, possible pregnancy, sweats or vomiting. Treatments tried: pyrdium. The treatment provided mild relief.        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   Elevated alkaline phosphatase level    negative workup (presumed secondary to fatty liver)   Frequency-urgency syndrome    GERD (gastroesophageal reflux disease)    none since weight loss surgery   Interstitial cystitis    Dr Vernie Shanks   Lymphadenopathy of head and neck 11/09/2018   Neuromuscular disorder ([place])    rt arm carpal tunnel syndrome   Obesity    status post bariatric procedure in [place] 2005   Osteoporosis    Sarcoidosis of lung (HCC)    Thyroid nodule 7/10   `   Thyroid nodule    Varicose vein of leg     Past Surgical History:  Procedure Laterality Date   ANTERIOR CERVICAL DECOMP/DISCECTOMY FUSION N/A 06/21/2012   Procedure: ANTERIOR CERVICAL DECOMPRESSION/DISCECTOMY FUSION C-4 - C5 (SPACER/DePUY CERVICAL PLATE ONLY) 1 LEVEL;  Surgeon: Melina Schools, MD;  Location: [place];  Service: Orthopedics;  Laterality: N/A;   BLADDER SURGERY  02/21/12 and 02/28/12   Dr Diona Fanti   COLPOSCOPY  1980   CRYOTHERAPY  1980   CYSTO WITH HYDRODISTENSION  12/12/2011    Procedure: CYSTOSCOPY/HYDRODISTENSION;  Surgeon: Franchot Gallo, MD;  Location: [facility];  Service: Urology;  Laterality: N/A;  [place]   bilateral cataract extraction   FOOT SURGERY     bilateral bunionettes   FOOT SURGERY Right 01/07/14   Pt states surgery was due to arthritis. "Has pins in place now"   FOREIGN BODY REMOVAL Right 09/27/2019   Procedure: REMOVAL FOREIGN BOD RIGHT FOOT;  Surgeon: Netta Cedars, MD;  [facility];  Service: Orthopedics;  Laterality: Right;   FRACTURE SURGERY Left 2019   wrist   GASTRIC BYPASS  2005   JOINT REPLACEMENT  2008   left total knee   KNEE SURGERY  1997   left knee   LYMPH NODE DISSECTION Right 11/09/2018   Procedure: EXCISE DEEP RIGHT CERVICAL LYMPH NODE;  Surgeon: Fanny Skates, MD;  Location: [place];  Service: General;  Laterality: Right;   NASAL SINUS SURGERY  0102   Dr [facility]   REFRACTIVE SURGERY  2006   lasik   REVERSE SHOULDER ARTHROPLASTY Left 09/27/2019   Procedure: REVERSE SHOULDER ARTHROPLASTY;  Surgeon: Netta Cedars, MD;  [facility];  Service: Orthopedics;  Laterality: Left;  2 hrs General with intrascalene block   SHOULDER SURGERY  2 2012   RIGHT    STOMACH SURGERY  2007   "tummy tuck"  TOTAL KNEE ARTHROPLASTY Right 12/31/2015   Procedure: RIGHT TOTAL KNEE ARTHROPLASTY;  Surgeon: [name], MD;  [facility];  Service: Orthopedics;  Laterality: Right;  Adductor Block   VARICOSE VEIN SURGERY Right 02/2012   leg    Family History  Problem Relation Age of Onset   Diabetes Paternal Grandfather    Stroke Paternal Grandfather    Hyperlipidemia Mother    Hypertension Mother    Hypertension Father    Diabetes Father    Prostate cancer Father    Dementia Father    Lung cancer Father    Heart disease Maternal Grandmother    Hypertension Maternal Grandmother    Heart disease Maternal Grandfather    Hypertension Maternal Grandfather    Stroke Maternal Grandfather     Arthritis Brother    Testicular cancer Brother    Colon cancer Neg Hx    Esophageal cancer Neg Hx    Pancreatic cancer Neg Hx    Stomach cancer Neg Hx     Social History   Socioeconomic History   Marital status: Significant Other    Spouse name: Not on file   Number of children: 1   Years of education: Not on file   Highest education level: Not on file  Occupational History   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[employer]  Tobacco Use   Smoking status: Never   Smokeless tobacco: Never  Vaping Use   Vaping Use: Never used  Substance and Sexual Activity   Alcohol use: Not Currently    Comment: RARE   Drug use: No   Sexual activity: Yes    Comment: 1st intercourse 66 yo-More than 5 partners  Other Topics Concern   Not on file  Social History Narrative   Lives with her partner, daughter (Bipolar/schizophrenia), granddaughter   Retired at age 71   Social Determinants of Radio broadcast assistant Strain: Not on file  Food Insecurity: Not on file  Transportation Needs: Not on file  Physical Activity: Not on file  Stress: Not on file  Social Connections: Not on file  Intimate Partner Violence: Not on file    Outpatient Medications Prior to Visit  Medication Sig Dispense Refill   acetaminophen (TYLENOL) 650 MG CR tablet Take 2 tablets (1,300 mg total) by mouth every 8 (eight) hours as needed for pain. Take for mild pain. Do not combine with Percocet. Do not exceed daily recommended limit of Tylenol.     ALPRAZolam (XANAX) 1 MG tablet Take 1 tablet (1 mg total) by mouth at bedtime as needed for anxiety. 30 tablet 0   Ascorbic Acid (VITAMIN C) 1000 MG tablet Take 1,000 mg by mouth at bedtime.     busPIRone (BUSPAR) 7.5 MG tablet Take 1 tablet (7.5 mg total) by mouth 2 (two) times daily. 60 tablet 1   Calcium Carb-Cholecalciferol (CALTRATE 600+D3 PO) Take 1 tablet by mouth in the morning and at bedtime.     Cholecalciferol (VITAMIN D3) 50 MCG (2000 UT) TABS  Take 4,000 Units by mouth at bedtime.     ferrous sulfate 325 (65 FE) MG tablet Take 325 mg by mouth in the morning and at bedtime.     fexofenadine (ALLEGRA) 180 MG tablet Take 180 mg by mouth at bedtime.     fluconazole (DIFLUCAN) 150 MG tablet Take 1 tab by mouth as needed for vaginal yeast infection. 1 tablet 0   fluticasone (FLONASE) 50 MCG/ACT nasal spray SPRAY 2 SPRAYS INTO  EACH NOSTRIL EVERY DAY 16 mL 5   Multiple Vitamin (MULTIVITAMIN WITH MINERALS) TABS tablet Take 1 tablet by mouth at bedtime.     MYRBETRIQ 50 MG TB24 tablet Take 50 mg by mouth at bedtime.   2   omeprazole (PRILOSEC) 40 MG capsule TAKE 1 CAPSULE BY MOUTH EVERY DAY 90 capsule 1   PARoxetine (PAXIL) 40 MG tablet TAKE ONE TABLET BY MOUTH EVERY MORNING 90 tablet 1   pilocarpine (SALAGEN) 5 MG tablet TAKE 1 TABLET BY MOUTH IN THE MORNING, AT NOON AND AT BEDTIME 60 tablet 5   sodium chloride (OCEAN) 0.65 % SOLN nasal spray Place 1-2 sprays into both nostrils 4 (four) times daily as needed for congestion.     valACYclovir (VALTREX) 500 MG tablet Take 1 tablet (500 mg total) by mouth daily. 90 tablet 1   zinc gluconate 50 MG tablet Take 50 mg by mouth at bedtime.      benzonatate (TESSALON) 100 MG capsule Take 1 capsule (100 mg total) by mouth 3 (three) times daily as needed for cough. 30 capsule 0   No facility-administered medications prior to visit.    Allergies  Allergen Reactions   Nitrofurantoin     REACTION: fever   Ibuprofen Nausea Only    REACTION: Post gastric bypass-->can not take due to surgery.  No allergy. REACTION: Post gastric bypass-->can not take due to surgery.  No allergy.    Review of Systems All review of systems negative except what is listed in the HPI      Objective:    Physical Exam Vitals reviewed.  Constitutional:      Appearance: Normal appearance.  HENT:     Head: Normocephalic and atraumatic.  Cardiovascular:     Rate and Rhythm: Normal rate and regular rhythm.  Pulmonary:      Effort: Pulmonary effort is normal.     Breath sounds: Normal breath sounds.  Abdominal:     General: Bowel sounds are normal. There is no distension.     Palpations: Abdomen is soft.     Tenderness: There is no right CVA tenderness or left CVA tenderness.  Musculoskeletal:        General: Normal range of motion.  Skin:    General: Skin is warm and dry.  Neurological:     General: No focal deficit present.     Mental Status: She is alert and oriented to person, place, and time. Mental status is at baseline.  Psychiatric:        Mood and Affect: Mood normal.        Behavior: Behavior normal.        Thought Content: Thought content normal.        Judgment: Judgment normal.    BP (!) 146/71    Pulse 75    Ht 5' 3.5" (1.613 m)    Wt 216 lb 12.8 oz (98.3 kg)    BMI 37.80 kg/m  Wt Readings from Last 3 Encounters:  03/29/21 216 lb 12.8 oz (98.3 kg)  01/11/21 218 lb (98.9 kg)  12/14/20 222 lb (100.7 kg)    Health Maintenance Due  Topic Date Due   COVID-19 Vaccine (3 - Moderna risk series) 08/02/2019    There are no preventive care reminders to display for this patient.   Lab Results  Component Value Date   TSH 0.35 12/14/2020   Lab Results  Component Value Date   WBC 5.0 12/14/2020   HGB 12.2 12/14/2020   HCT  37.8 12/14/2020   MCV 87.7 12/14/2020   PLT 257.0 12/14/2020   Lab Results  Component Value Date   NA 144 12/14/2020   K 3.8 12/14/2020   CO2 30 12/14/2020   GLUCOSE 91 12/14/2020   BUN 14 12/14/2020   CREATININE 0.52 12/14/2020   BILITOT 0.6 12/14/2020   ALKPHOS 125 (H) 12/14/2020   AST 20 12/14/2020   ALT 16 12/14/2020   PROT 7.1 12/14/2020   ALBUMIN 4.2 12/14/2020   CALCIUM 9.3 12/14/2020   ANIONGAP 6 09/28/2019   GFR 98.04 12/14/2020   Lab Results  Component Value Date   CHOL 164 12/14/2020   Lab Results  Component Value Date   HDL 47.50 12/14/2020   Lab Results  Component Value Date   LDLCALC 89 12/14/2020   Lab Results  Component  Value Date   TRIG 139.0 12/14/2020   Lab Results  Component Value Date   CHOLHDL 3 12/14/2020   Lab Results  Component Value Date   HGBA1C 5.7 09/18/2017       Assessment & Plan:   1. Dysuria 2. UTI symptoms UA with nitrites, but no leukocytes. We will send this off for culture. I will give you some Bactrim (antibiotic). If you can hold for another day or two, that would be ideal so we can get culture results back. If miserable, go ahead and start, and we will let you know if we need to make any changes based on culture results. Stay well hydrated! Hope you feel better soon!  - POCT Urinalysis Dipstick (Automated) - Urine Culture - sulfamethoxazole-trimethoprim (BACTRIM DS) 800-160 MG tablet; Take 1 tablet by mouth 2 (two) times daily for 3 days.  Dispense: 6 tablet; Refill: 0  Follow-up if symptoms worsen or fail to improve.    Terrilyn Saver, NP

## 2021-03-30 ENCOUNTER — Encounter: Payer: Self-pay | Admitting: Family Medicine

## 2021-03-30 LAB — URINE CULTURE
MICRO NUMBER:: 13000209
Result:: NO GROWTH
SPECIMEN QUALITY:: ADEQUATE

## 2021-04-04 ENCOUNTER — Encounter: Payer: Self-pay | Admitting: Family

## 2021-04-04 ENCOUNTER — Other Ambulatory Visit: Payer: Self-pay | Admitting: Family

## 2021-04-05 MED ORDER — ALPRAZOLAM 1 MG PO TABS: 1.0000 mg | ORAL_TABLET | Freq: Every evening | ORAL | 0 refills | Status: DC | PRN

## 2021-04-05 NOTE — Telephone Encounter (Signed)
Requesting: Xanax 1MG  Contract: 12/14/20 UDS: 12/14/20 Last Visit: 03/29/21 Next Visit: 06/14/21 Last Refill: 03/02/21, #30, 0 refills  Please Advise

## 2021-04-05 NOTE — Telephone Encounter (Signed)
Requesting: Xanax 1 mg Contract: 12/13/2020 UDS: 12/13/2020 Last Visit: 02/16/2021 Next Visit: 06/14/2021 Last Refill: 03/02/2021, #30 x 0RF  Please Advise

## 2021-04-10 ENCOUNTER — Other Ambulatory Visit: Payer: Self-pay | Admitting: Family

## 2021-04-14 ENCOUNTER — Telehealth: Payer: Self-pay | Admitting: Family

## 2021-04-14 ENCOUNTER — Telehealth: Payer: Medicare Other | Admitting: Family

## 2021-04-14 NOTE — Telephone Encounter (Signed)
See mychart.  

## 2021-04-14 NOTE — Telephone Encounter (Signed)
Can we see what EOB would look like for her? ?

## 2021-04-14 NOTE — Telephone Encounter (Signed)
Pt has new insurance on file ? ?And would like to schedule an appointment for prolia shot once approved by insurance ? ?Please advise  ?

## 2021-04-14 NOTE — Telephone Encounter (Signed)
Patient is now on medicare. Can we please retry her Prolia authorization?  ?

## 2021-04-15 NOTE — Telephone Encounter (Addendum)
UHC Medicare ?AARP Medicare Adv Plan 1 ?HMO-POS ? ?Member # [id number] ?Group # [id number] ? ?Rx BIN [id number] ?[id number] [id number] ?RxGroup COS ? ?

## 2021-04-15 NOTE — Telephone Encounter (Signed)
Insurance updated.  ? ?VOB re-submitted for PROLIA ? ?

## 2021-04-22 DIAGNOSIS — M19071 Primary osteoarthritis, right ankle and foot: Secondary | ICD-10-CM | POA: Diagnosis not present

## 2021-04-24 NOTE — Telephone Encounter (Signed)
Pt ready for scheduling on or after 04/24/21 ? ?Out-of-pocket cost due at time of visit: $276 ? ?Primary: AARP Medicare ?Prolia co-insurance: 20% (approximately $276) ?Admin fee co-insurance: 0%  ? ?Secondary: n/a ?Prolia co-insurance:  ?Admin fee co-insurance:  ? ?Deductible: does not apply ? ?Prior Auth: not required ?PA# ?Valid:  ? ?** This summary of benefits is an estimation of the patient's out-of-pocket cost. Exact cost may vary based on individual plan coverage.  ? ?

## 2021-04-26 DIAGNOSIS — N3000 Acute cystitis without hematuria: Secondary | ICD-10-CM | POA: Diagnosis not present

## 2021-04-26 DIAGNOSIS — N3281 Overactive bladder: Secondary | ICD-10-CM | POA: Diagnosis not present

## 2021-04-26 NOTE — Telephone Encounter (Signed)
My Chart message sent

## 2021-05-03 ENCOUNTER — Encounter: Payer: Self-pay | Admitting: Family

## 2021-05-03 MED ORDER — ALPRAZOLAM 1 MG PO TABS: 1.0000 mg | ORAL_TABLET | Freq: Every evening | ORAL | 0 refills | Status: DC | PRN

## 2021-05-03 NOTE — Telephone Encounter (Signed)
Prolia update: ? ?Pt states she is having dental work done and was told that she can't have her injection until 3 months after that.  ? ?She will call to schedule- cost is okay with her.  ?

## 2021-05-03 NOTE — Telephone Encounter (Signed)
Requesting: alprazolam '1mg'$   ?Contract: 12/14/2020 ?UDS: 12/14/2020 ?Last Visit: 02/16/2021 ?Next Visit: 06/14/2021 ?Last Refill: 04/05/2021 #30 and 0RF ? ?Please Advise ? ?

## 2021-05-04 MED ORDER — PANTOPRAZOLE SODIUM 40 MG PO TBEC: 40.0000 mg | DELAYED_RELEASE_TABLET | Freq: Every day | ORAL | 1 refills | Status: DC

## 2021-05-11 DIAGNOSIS — N3281 Overactive bladder: Secondary | ICD-10-CM | POA: Diagnosis not present

## 2021-05-11 DIAGNOSIS — N3000 Acute cystitis without hematuria: Secondary | ICD-10-CM | POA: Diagnosis not present

## 2021-05-11 DIAGNOSIS — R8279 Other abnormal findings on microbiological examination of urine: Secondary | ICD-10-CM | POA: Diagnosis not present

## 2021-06-01 ENCOUNTER — Other Ambulatory Visit: Payer: Self-pay | Admitting: Family

## 2021-06-01 ENCOUNTER — Encounter: Payer: Self-pay | Admitting: Family

## 2021-06-14 ENCOUNTER — Ambulatory Visit (INDEPENDENT_AMBULATORY_CARE_PROVIDER_SITE_OTHER): Payer: Medicare Other | Admitting: Family

## 2021-06-14 ENCOUNTER — Other Ambulatory Visit (HOSPITAL_BASED_OUTPATIENT_CLINIC_OR_DEPARTMENT_OTHER): Payer: Self-pay

## 2021-06-14 ENCOUNTER — Telehealth: Payer: Self-pay | Admitting: Family

## 2021-06-14 VITALS — BP 130/82 | HR 79 | Temp 98.7°F | Resp 16 | Wt 213.0 lb

## 2021-06-14 DIAGNOSIS — E559 Vitamin D deficiency, unspecified: Secondary | ICD-10-CM | POA: Diagnosis not present

## 2021-06-14 DIAGNOSIS — I10 Essential (primary) hypertension: Secondary | ICD-10-CM | POA: Diagnosis not present

## 2021-06-14 DIAGNOSIS — K219 Gastro-esophageal reflux disease without esophagitis: Secondary | ICD-10-CM

## 2021-06-14 DIAGNOSIS — M810 Age-related osteoporosis without current pathological fracture: Secondary | ICD-10-CM | POA: Diagnosis not present

## 2021-06-14 DIAGNOSIS — F419 Anxiety disorder, unspecified: Secondary | ICD-10-CM | POA: Diagnosis not present

## 2021-06-14 DIAGNOSIS — D649 Anemia, unspecified: Secondary | ICD-10-CM

## 2021-06-14 DIAGNOSIS — N3281 Overactive bladder: Secondary | ICD-10-CM | POA: Diagnosis not present

## 2021-06-14 DIAGNOSIS — J309 Allergic rhinitis, unspecified: Secondary | ICD-10-CM

## 2021-06-14 LAB — CBC WITH DIFFERENTIAL/PLATELET
Basophils Absolute: 0 10*3/uL (ref 0.0–0.1)
Basophils Relative: 0.9 % (ref 0.0–3.0)
Eosinophils Absolute: 0.2 10*3/uL (ref 0.0–0.7)
Eosinophils Relative: 4.1 % (ref 0.0–5.0)
HCT: 37.9 % (ref 36.0–46.0)
Hemoglobin: 12.3 g/dL (ref 12.0–15.0)
Lymphocytes Relative: 25.6 % (ref 12.0–46.0)
Lymphs Abs: 1.2 10*3/uL (ref 0.7–4.0)
MCHC: 32.4 g/dL (ref 30.0–36.0)
MCV: 92.3 fl (ref 78.0–100.0)
Monocytes Absolute: 0.5 10*3/uL (ref 0.1–1.0)
Monocytes Relative: 10 % (ref 3.0–12.0)
Neutro Abs: 2.8 10*3/uL (ref 1.4–7.7)
Neutrophils Relative %: 59.4 % (ref 43.0–77.0)
Platelets: 227 10*3/uL (ref 150.0–400.0)
RBC: 4.1 Mil/uL (ref 3.87–5.11)
RDW: 14.2 % (ref 11.5–15.5)
WBC: 4.7 10*3/uL (ref 4.0–10.5)

## 2021-06-14 LAB — BASIC METABOLIC PANEL
BUN: 19 mg/dL (ref 6–23)
CO2: 24 mEq/L (ref 19–32)
Calcium: 8.8 mg/dL (ref 8.4–10.5)
Chloride: 109 mEq/L (ref 96–112)
Creatinine, Ser: 0.54 mg/dL (ref 0.40–1.20)
GFR: 96.81 mL/min (ref >60.00)
Glucose, Bld: 89 mg/dL (ref 70–99)
Potassium: 3.9 mEq/L (ref 3.5–5.1)
Sodium: 143 mEq/L (ref 135–145)

## 2021-06-14 LAB — FERRITIN: Ferritin: 271.6 ng/mL (ref 10.0–291.0)

## 2021-06-14 LAB — IRON: Iron: 58 ug/dL (ref 42–145)

## 2021-06-14 LAB — VITAMIN D 25 HYDROXY (VIT D DEFICIENCY, FRACTURES): VITD: 23.19 ng/mL — ABNORMAL LOW (ref 30.00–100.00)

## 2021-06-14 MED ORDER — PREVIDENT 5000 BOOSTER PLUS 1.1 % DT PSTE
PASTE | DENTAL | 2 refills | Status: DC
  Filled 2021-06-14: qty 100, 30d supply, fill #0
  Filled 2021-11-01: qty 100, 30d supply, fill #1
  Filled 2021-11-29 – 2021-12-15 (×2): qty 100, 30d supply, fill #2

## 2021-06-14 MED ORDER — DICLOFENAC SODIUM 1 % EX GEL: 2.0000 g | Freq: Two times a day (BID) | CUTANEOUS | 3 refills | Status: DC | PRN

## 2021-06-14 NOTE — Assessment & Plan Note (Signed)
Plans to begin prolia in 1 month.  ?

## 2021-06-14 NOTE — Assessment & Plan Note (Signed)
Lab Results  ?Component Value Date  ? WBC 5.0 12/14/2020  ? HGB 12.2 12/14/2020  ? HCT 37.8 12/14/2020  ? MCV 87.7 12/14/2020  ? PLT 257.0 12/14/2020  ? ?Continues iron supplement.  ? ?

## 2021-06-14 NOTE — Progress Notes (Signed)
? ?Subjective:  ? ?By signing my name below, I, [name] [name], attest that this documentation has been prepared under the direction and in the presence of  [name] Alar, NP 06/14/2021  ? ? ? Patient ID: [name] [name], female    DOB: 1955-06-12, 66 y.o.   MRN: [id number] ? ?Chief Complaint  ?Patient presents with  ? Hypertension  ?  Here for follow up  ? ? ?HPI ?Patient is in today for an office visit and 6 month f/u ? ?Hypertension- Her blood pressure is elevated at the beginning of this visit. - 146/64 ?BP Readings from Last 3 Encounters:  ?06/14/21 130/82  ?03/29/21 (!) 146/71  ?01/11/21 139/73  ?  ?Anxiety- She was started on 7.5 mg Buspar to manage anxiety and is doing well with it. She denies any feelings of anxiety at this time and uses 1 mg xanax every night. She is having some family issues at home and lost her dog but was able to handle the issues well.  ? ?Foot and shoulder pain- She reports she was having swelling in her right foot and was told it was due to arthritis. She purchased a pair of shoes that has been helping her to walk and stay balanced. She also complains of consistent right shoulder pain that she has been dealing with for a long time. She uses Voltaren gel to manage the pain. ? ?Iron- She recently started a new OTC medication that contains vitamins and iron and is wondering if she can stop her other vitamins. ? ?Allergies- She has been having environmental allergies and feeling congested but is using 4-hour sudafed. It helps to manage the symptoms. Sudafed is the most effective and uses it with Flonase and allegra. ? ?Bladder control-  She is still using 50 mg myrbetriq to control her bladder and is doing well with it. Significant difference when she does not use it. ? ?Bone density- She recently had tooth surgery which delayed the start of her prolia injection.  ? ?She is requesting for a refill on Prevident toothpaste and Voltaren gel  ? ?Past Medical History:  ?Diagnosis Date  ?  Allergy   ? Anemia   ? takes iron supplement  ? Anxiety   ? Arthritis   ? feet, shoulders,knees,   ? 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 Elevated alkaline phosphatase level   ? negative workup (presumed secondary to fatty liver)  ? Frequency-urgency syndrome   ? GERD (gastroesophageal reflux disease)   ? none since weight loss surgery  ? Interstitial cystitis   ? Dr Vernie Shanks  ? Lymphadenopathy of head and neck 11/09/2018  ? Neuromuscular disorder ([place])   ? rt arm carpal tunnel syndrome  ? Obesity   ? status post bariatric procedure in [facility] 2005  ? Osteoporosis   ? Sarcoidosis of lung ([place])   ? Thyroid nodule 7/10  ? `  ? Thyroid nodule   ? Varicose vein of leg   ? ? ?Past Surgical History:  ?Procedure Laterality Date  ? ANTERIOR CERVICAL DECOMP/DISCECTOMY FUSION N/A 06/21/2012  ? Procedure: ANTERIOR CERVICAL DECOMPRESSION/DISCECTOMY FUSION C-4 - C5 (SPACER/DePUY CERVICAL PLATE ONLY) 1 LEVEL;  Surgeon: Melina Schools, MD;  Location: [place];  Service: Orthopedics;  Laterality: N/A;  ? BLADDER SURGERY  02/21/12 and 02/28/12  ? Dr Diona Fanti  ? COLPOSCOPY  1980  ? CRYOTHERAPY  1980  ? CYSTO WITH HYDRODISTENSION  12/12/2011  ? Procedure: CYSTOSCOPY/HYDRODISTENSION;  Surgeon: Franchot Gallo, MD;  Location: [facility];  Service: Urology;  Laterality: N/A;  30 MIN ?  ? [place]  ? bilateral cataract extraction  ? FOOT SURGERY    ? bilateral bunionettes  ? FOOT SURGERY Right 01/07/14  ? Pt states surgery was due to arthritis. "Has pins in place now"  ? FOREIGN BODY REMOVAL Right 09/27/2019  ? Procedure: REMOVAL FOREIGN BOD RIGHT FOOT;  Surgeon: Netta Cedars, MD;  [facility];  Service: Orthopedics;  Laterality: Right;  ? FRACTURE SURGERY Left 2019  ? wrist  ? GASTRIC BYPASS  2005  ? JOINT REPLACEMENT  2008  ? left total knee  ? KNEE SURGERY  1997  ? left knee  ? LYMPH NODE DISSECTION Right 11/09/2018  ? Procedure: EXCISE DEEP RIGHT CERVICAL LYMPH NODE;  Surgeon: Fanny Skates, MD;  Location: [place];   Service: General;  Laterality: Right;  ? NASAL SINUS SURGERY  1989  ? Dr Erik Obey  ? REFRACTIVE SURGERY  2006  ? lasik  ? REVERSE SHOULDER ARTHROPLASTY Left 09/27/2019  ? Procedure: REVERSE SHOULDER ARTHROPLASTY;  Surgeon: Netta Cedars, MD;  [facility];  Service: Orthopedics;  Laterality: Left;  2 hrs ?General with intrascalene block  ? SHOULDER SURGERY  2 2012  ? RIGHT   ? STOMACH SURGERY  2007  ? "tummy tuck"  ? TOTAL KNEE ARTHROPLASTY Right 12/31/2015  ? Procedure: RIGHT TOTAL KNEE ARTHROPLASTY;  Surgeon: [name], MD;  [facility];  Service: Orthopedics;  Laterality: Right;  Adductor Block  ? VARICOSE VEIN SURGERY Right 02/2012  ? leg  ? ? ?Family History  ?Problem Relation Age of Onset  ? Diabetes Paternal Grandfather   ? Stroke Paternal Grandfather   ? Hyperlipidemia Mother   ? Hypertension Mother   ? Hypertension Father   ? Diabetes Father   ? Prostate cancer Father   ? Dementia Father   ? Lung cancer Father   ? Heart disease Maternal Grandmother   ? Hypertension Maternal Grandmother   ? Heart disease Maternal Grandfather   ? Hypertension Maternal Grandfather   ? Stroke Maternal Grandfather   ? Arthritis Brother   ? Testicular cancer Brother   ? Colon cancer Neg Hx   ? Esophageal cancer Neg Hx   ? Pancreatic cancer Neg Hx   ? Stomach cancer Neg Hx   ? ? ?Social History  ? ?Socioeconomic History  ? Marital status: Significant Other  ?  Spouse name: Not on file  ? Number of children: 1  ? Years of education: Not on file  ? Highest education level: Not on file  ?Occupational History  ? Occupation: MANAGER  ?  Employer: [employer]  ?Tobacco Use  ? Smoking status: Never  ? Smokeless tobacco: Never  ?Vaping Use  ? Vaping Use: Never used  ?Substance and Sexual Activity  ? Alcohol use: Not Currently  ?  Comment: RARE  ? Drug use: No  ? Sexual activity: Yes  ?  Comment: 1st intercourse 66 yo-More than 5 partners  ?Other Topics Concern  ? Not on file  ?Social History Narrative  ? Lives with  her partner, daughter (Bipolar/schizophrenia), granddaughter  ? Retired at age 89  ? ?Social Determinants of Health  ? ?Financial Resource Strain: Not on file  ?Food Insecurity: Not on file  ?Transportation Needs: Not on file  ?Physical Activity: Not on file  ?Stress: Not on file  ?Social Connections: Not on file  ?Intimate Partner Violence: Not on file  ? ? ?  Outpatient Medications Prior to Visit  ?Medication Sig Dispense Refill  ? acetaminophen (TYLENOL) 650 MG CR tablet Take 2 tablets (1,300 mg total) by mouth every 8 (eight) hours as needed for pain. Take for mild pain. Do not combine with Percocet. Do not exceed daily recommended limit of Tylenol.    ? ALPRAZolam (XANAX) 1 MG tablet TAKE 1 TABLET BY MOUTH AT BEDTIME AS NEEDED FOR ANXIETY. 30 tablet 0  ? Ascorbic Acid (VITAMIN C) 1000 MG tablet Take 1,000 mg by mouth at bedtime.    ? busPIRone (BUSPAR) 7.5 MG tablet TAKE 1 TABLET BY MOUTH 2 TIMES DAILY. 180 tablet 0  ? Calcium Carb-Cholecalciferol (CALTRATE 600+D3 PO) Take 1 tablet by mouth in the morning and at bedtime.    ? Cholecalciferol (VITAMIN D3) 50 MCG (2000 UT) TABS Take 4,000 Units by mouth at bedtime.    ? ferrous sulfate 325 (65 FE) MG tablet Take 325 mg by mouth in the morning and at bedtime.    ? fexofenadine (ALLEGRA) 180 MG tablet Take 180 mg by mouth at bedtime.    ? fluconazole (DIFLUCAN) 150 MG tablet Take 1 tab by mouth as needed for vaginal yeast infection. 1 tablet 0  ? fluticasone (FLONASE) 50 MCG/ACT nasal spray SPRAY 2 SPRAYS INTO EACH NOSTRIL EVERY DAY 16 mL 5  ? Multiple Vitamin (MULTIVITAMIN WITH MINERALS) TABS tablet Take 1 tablet by mouth at bedtime.    ? MYRBETRIQ 50 MG TB24 tablet Take 50 mg by mouth at bedtime.   2  ? pantoprazole (PROTONIX) 40 MG tablet Take 1 tablet (40 mg total) by mouth daily. 90 tablet 1  ? PARoxetine (PAXIL) 40 MG tablet TAKE ONE TABLET BY MOUTH EVERY MORNING 90 tablet 1  ? pilocarpine (SALAGEN) 5 MG tablet TAKE 1 TABLET BY MOUTH IN THE MORNING, AT NOON AND  AT BEDTIME 60 tablet 5  ? sodium chloride (OCEAN) 0.65 % SOLN nasal spray Place 1-2 sprays into both nostrils 4 (four) times daily as needed for congestion.    ? valACYclovir (VALTREX) 500 MG tablet

## 2021-06-14 NOTE — Assessment & Plan Note (Signed)
On allegra and flonase.   ?

## 2021-06-14 NOTE — Assessment & Plan Note (Signed)
Stable, continue protonix 40 mg once daily.  ?

## 2021-06-14 NOTE — Assessment & Plan Note (Signed)
BP Readings from Last 3 Encounters:  ?06/14/21 (!) 146/64  ?03/29/21 (!) 146/71  ?01/11/21 139/73  ? ?She is advised to stop sudafed.   ?

## 2021-06-14 NOTE — Assessment & Plan Note (Signed)
Stable on alprazolam and buspar and paxil.  ?

## 2021-06-14 NOTE — Telephone Encounter (Signed)
Opened in error

## 2021-06-14 NOTE — Assessment & Plan Note (Signed)
Stable on myrbetric, follows with urology.  ?

## 2021-06-16 ENCOUNTER — Telehealth: Payer: Self-pay | Admitting: Family

## 2021-06-16 MED ORDER — VITAMIN D3 75 MCG (3000 UT) PO TABS: 1.0000 | ORAL_TABLET | Freq: Every day | ORAL | Status: AC

## 2021-06-16 NOTE — Telephone Encounter (Signed)
Yes please

## 2021-06-16 NOTE — Telephone Encounter (Signed)
Patient advised to increase dose from 4000 a day to 6000 a day ?

## 2021-06-16 NOTE — Telephone Encounter (Signed)
Iron levels look good. Vitamin D is a little low. Please increase vit D from 2000 iu daily to 3000 iu day.  ?

## 2021-06-28 ENCOUNTER — Other Ambulatory Visit: Payer: Self-pay | Admitting: Family

## 2021-06-29 DIAGNOSIS — N3000 Acute cystitis without hematuria: Secondary | ICD-10-CM | POA: Diagnosis not present

## 2021-07-01 ENCOUNTER — Encounter: Payer: Self-pay | Admitting: Family

## 2021-07-01 ENCOUNTER — Other Ambulatory Visit: Payer: Self-pay | Admitting: Family

## 2021-07-01 DIAGNOSIS — H47011 Ischemic optic neuropathy, right eye: Secondary | ICD-10-CM | POA: Diagnosis not present

## 2021-07-01 DIAGNOSIS — Z83518 Family history of other specified eye disorder: Secondary | ICD-10-CM | POA: Diagnosis not present

## 2021-07-01 DIAGNOSIS — H04123 Dry eye syndrome of bilateral lacrimal glands: Secondary | ICD-10-CM | POA: Diagnosis not present

## 2021-07-01 DIAGNOSIS — H35363 Drusen (degenerative) of macula, bilateral: Secondary | ICD-10-CM | POA: Diagnosis not present

## 2021-07-01 NOTE — Telephone Encounter (Signed)
Requesting: alprazolam '1mg'$   Contract: 12/14/20 UDS: 12/14/20 Last Visit: 06/14/21 Next Visit: 12/15/21 Last Refill: 06/02/21 #30 and 0RF  Please Advise

## 2021-07-02 DIAGNOSIS — M1991 Primary osteoarthritis, unspecified site: Secondary | ICD-10-CM | POA: Diagnosis not present

## 2021-07-02 DIAGNOSIS — D869 Sarcoidosis, unspecified: Secondary | ICD-10-CM | POA: Diagnosis not present

## 2021-07-02 MED ORDER — ALPRAZOLAM 1 MG PO TABS: ORAL_TABLET | ORAL | 0 refills | Status: DC

## 2021-07-02 NOTE — Telephone Encounter (Signed)
Patient awaiting respones

## 2021-07-04 ENCOUNTER — Other Ambulatory Visit: Payer: Self-pay | Admitting: Family

## 2021-07-06 ENCOUNTER — Other Ambulatory Visit (HOSPITAL_COMMUNITY): Payer: Self-pay

## 2021-07-15 ENCOUNTER — Ambulatory Visit: Payer: Medicare Other

## 2021-07-15 ENCOUNTER — Ambulatory Visit (INDEPENDENT_AMBULATORY_CARE_PROVIDER_SITE_OTHER): Payer: Medicare Other

## 2021-07-15 DIAGNOSIS — M810 Age-related osteoporosis without current pathological fracture: Secondary | ICD-10-CM | POA: Diagnosis not present

## 2021-07-15 MED ORDER — DENOSUMAB 60 MG/ML ~~LOC~~ SOSY
60.0000 mg | PREFILLED_SYRINGE | Freq: Once | SUBCUTANEOUS | Status: AC
  Administered 2021-07-15: 60 mg via SUBCUTANEOUS

## 2021-07-15 NOTE — Progress Notes (Signed)
[name] [name] is a 66 y.o. female presents to the office today for Prolia  injections, per physician's orders. Original order: Left Arm  Patient tolerated injection.   Jaris Kohles M Zamariya Neal

## 2021-07-20 ENCOUNTER — Other Ambulatory Visit (HOSPITAL_BASED_OUTPATIENT_CLINIC_OR_DEPARTMENT_OTHER): Payer: Self-pay

## 2021-07-20 ENCOUNTER — Ambulatory Visit (INDEPENDENT_AMBULATORY_CARE_PROVIDER_SITE_OTHER): Payer: Medicare Other | Admitting: Family

## 2021-07-20 VITALS — BP 135/51 | HR 82 | Temp 98.4°F | Resp 16 | Wt 216.0 lb

## 2021-07-20 DIAGNOSIS — J320 Chronic maxillary sinusitis: Secondary | ICD-10-CM | POA: Diagnosis not present

## 2021-07-20 MED ORDER — AMOXICILLIN-POT CLAVULANATE 875-125 MG PO TABS: 1.0000 | ORAL_TABLET | Freq: Two times a day (BID) | ORAL | 0 refills | Status: DC

## 2021-07-20 MED ORDER — AMOXICILLIN-POT CLAVULANATE 875-125 MG PO TABS
1.0000 | ORAL_TABLET | Freq: Two times a day (BID) | ORAL | 0 refills | Status: DC
  Filled 2021-07-20: qty 20, 10d supply, fill #0

## 2021-07-20 MED ORDER — FLUCONAZOLE 150 MG PO TABS: ORAL_TABLET | ORAL | 0 refills | Status: DC

## 2021-07-20 MED ORDER — FLUCONAZOLE 150 MG PO TABS
ORAL_TABLET | ORAL | 0 refills | Status: DC
  Filled 2021-07-20: qty 1, 1d supply, fill #0

## 2021-07-20 NOTE — Progress Notes (Signed)
Subjective:   By signing my name below, I, Carylon Perches, attest that this documentation has been prepared under the direction and in the presence of [name] NP, 07/20/2021    Patient ID: [name] [name], female    DOB: [birth year], 66 y.o.   MRN: [id number]  Chief Complaint  Patient presents with   Facial Pain    Complains of sinus pain and pressure    HPI Patient is in today for an office visit.  Sinus Infection - She complains of a sinus infection that worsened about two weeks ago. She states that she developed a headache about a week ago. She also has eye itchiness and dryness. She was previously taking Sudafed for her allergies which alleviated her symptoms. She discontinued Sudafed and was taking Claritin which did not help her symptoms as much. She is now taking 50 MCG/ACT of Flonase twice a day, 180 Mg of Allegra every day and OTC Eye Drops. She has purchased an Counsellor which she states helps her symptoms.   Health Maintenance Due  Topic Date Due   COVID-19 Vaccine (3 - Moderna risk series) 08/02/2019    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   Elevated alkaline phosphatase level    negative workup (presumed secondary to fatty liver)   Frequency-urgency syndrome    GERD (gastroesophageal reflux disease)    none since weight loss surgery   Interstitial cystitis    Dr Vernie Shanks   Lymphadenopathy of head and neck 11/09/2018   Neuromuscular disorder ([place])    rt arm carpal tunnel syndrome   Obesity    status post bariatric procedure in [place] 2005   Osteoporosis    Sarcoidosis of lung (HCC)    Thyroid nodule 7/10   `   Thyroid nodule    Varicose vein of leg     Past Surgical History:  Procedure Laterality Date   ANTERIOR CERVICAL DECOMP/DISCECTOMY FUSION N/A 06/21/2012   Procedure: ANTERIOR CERVICAL  DECOMPRESSION/DISCECTOMY FUSION C-4 - C5 (SPACER/DePUY CERVICAL PLATE ONLY) 1 LEVEL;  Surgeon: Melina Schools, MD;  Location: [facility];  Service: Orthopedics;  Laterality: N/A;   BLADDER SURGERY  02/21/12 and 02/28/12   Dr Diona Fanti   COLPOSCOPY  1980   CRYOTHERAPY  1980   CYSTO WITH HYDRODISTENSION  12/12/2011   Procedure: CYSTOSCOPY/HYDRODISTENSION;  Surgeon: Franchot Gallo, MD;  Location: [facility];  Service: Urology;  Laterality: N/A;  [place]   bilateral cataract extraction   FOOT SURGERY     bilateral bunionettes   FOOT SURGERY Right 01/07/14   Pt states surgery was due to arthritis. "Has pins in place now"   FOREIGN BODY REMOVAL Right 09/27/2019   Procedure: REMOVAL FOREIGN BOD RIGHT FOOT;  Surgeon: Netta Cedars, MD;  Location: [facility];  Service: Orthopedics;  Laterality: Right;   FRACTURE SURGERY Left 2019   wrist   GASTRIC BYPASS  2005   JOINT REPLACEMENT  2008   left total knee   KNEE SURGERY  1997   left knee   LYMPH NODE DISSECTION Right 11/09/2018   Procedure: EXCISE DEEP RIGHT CERVICAL LYMPH NODE;  Surgeon: Fanny Skates, MD;  Location: [facility];  Service: General;  Laterality: Right;   NASAL SINUS SURGERY  1989   Dr  [facility]   REFRACTIVE SURGERY  2006   lasik   REVERSE SHOULDER ARTHROPLASTY Left 09/27/2019   Procedure: REVERSE SHOULDER ARTHROPLASTY;  Surgeon: Netta Cedars, MD;  Location: [facility];  Service: Orthopedics;  Laterality: Left;  2 hrs General with intrascalene block   SHOULDER SURGERY  2 2012   RIGHT    STOMACH SURGERY  2007   "tummy tuck"   TOTAL KNEE ARTHROPLASTY Right 12/31/2015   Procedure: RIGHT TOTAL KNEE ARTHROPLASTY;  Surgeon: [name], MD;  Location: [facility];  Service: Orthopedics;  Laterality: Right;  Adductor Block   VARICOSE VEIN SURGERY Right 02/2012   leg    Family History  Problem Relation Age of Onset   Diabetes Paternal Grandfather    Stroke Paternal Grandfather    Hyperlipidemia Mother     Hypertension Mother    Hypertension Father    Diabetes Father    Prostate cancer Father    Dementia Father    Lung cancer Father    Heart disease Maternal Grandmother    Hypertension Maternal Grandmother    Heart disease Maternal Grandfather    Hypertension Maternal Grandfather    Stroke Maternal Grandfather    Arthritis Brother    Testicular cancer Brother    Colon cancer Neg Hx    Esophageal cancer Neg Hx    Pancreatic cancer Neg Hx    Stomach cancer Neg Hx     Social History   Socioeconomic History   Marital status: Significant Other    Spouse name: Not on file   Number of children: 1   Years of education: Not on file   Highest education level: Not on file  Occupational History   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[employer]  Tobacco Use   Smoking status: Never   Smokeless tobacco: Never  Vaping Use   Vaping Use: Never used  Substance and Sexual Activity   Alcohol use: Not Currently    Comment: RARE   Drug use: No   Sexual activity: Yes    Comment: 1st intercourse 66 yo-More than 5 partners  Other Topics Concern   Not on file  Social History Narrative   Lives with her partner, daughter (Bipolar/schizophrenia), granddaughter   Retired at age 39   Social Determinants of Radio broadcast assistant Strain: Not on file  Food Insecurity: Not on file  Transportation Needs: Not on file  Physical Activity: Not on file  Stress: Not on file  Social Connections: Not on file  Intimate Partner Violence: Not on file    Outpatient Medications Prior to Visit  Medication Sig Dispense Refill   acetaminophen (TYLENOL) 650 MG CR tablet Take 2 tablets (1,300 mg total) by mouth every 8 (eight) hours as needed for pain. Take for mild pain. Do not combine with Percocet. Do not exceed daily recommended limit of Tylenol.     ALPRAZolam (XANAX) 1 MG tablet TAKE 1 TABLET BY MOUTH AT BEDTIME AS NEEDED FOR ANXIETY. 30 tablet 0   Ascorbic Acid (VITAMIN C) 1000 MG tablet  Take 1,000 mg by mouth at bedtime.     busPIRone (BUSPAR) 7.5 MG tablet TAKE 1 TABLET BY MOUTH TWICE A DAY 60 tablet 2   Calcium Carb-Cholecalciferol (CALTRATE 600+D3 PO) Take 1 tablet by mouth in the morning and at bedtime.     Cholecalciferol (VITAMIN D3) 75 MCG (3000 UT) TABS Take 1 tablet by mouth daily. 30 tablet    diclofenac Sodium (VOLTAREN) 1 % GEL Apply 2  g topically 2 (two) times daily as needed. 100 g 3   ferrous sulfate 325 (65 FE) MG tablet Take 325 mg by mouth in the morning and at bedtime.     fexofenadine (ALLEGRA) 180 MG tablet Take 180 mg by mouth at bedtime.     fluticasone (FLONASE) 50 MCG/ACT nasal spray SPRAY 2 SPRAYS INTO EACH NOSTRIL EVERY DAY 16 mL 5   Multiple Vitamin (MULTIVITAMIN WITH MINERALS) TABS tablet Take 1 tablet by mouth at bedtime.     MYRBETRIQ 50 MG TB24 tablet Take 50 mg by mouth at bedtime.   2   pantoprazole (PROTONIX) 40 MG tablet Take 1 tablet (40 mg total) by mouth daily. 90 tablet 1   PARoxetine (PAXIL) 40 MG tablet TAKE 1 TABLET BY MOUTH EVERY DAY IN THE MORNING 30 tablet 5   pilocarpine (SALAGEN) 5 MG tablet TAKE 1 TABLET BY MOUTH IN THE MORNING, AT NOON AND AT BEDTIME 60 tablet 5   sodium chloride (OCEAN) 0.65 % SOLN nasal spray Place 1-2 sprays into both nostrils 4 (four) times daily as needed for congestion.     Sodium Fluoride (PREVIDENT 5000 BOOSTER PLUS) 1.1 % PSTE Use as directed. 100 mL 2   valACYclovir (VALTREX) 500 MG tablet Take 1 tablet (500 mg total) by mouth daily. 90 tablet 1   zinc gluconate 50 MG tablet Take 50 mg by mouth at bedtime.      fluconazole (DIFLUCAN) 150 MG tablet Take 1 tab by mouth as needed for vaginal yeast infection. 1 tablet 0   No facility-administered medications prior to visit.    Allergies  Allergen Reactions   Nitrofurantoin     REACTION: fever   Ibuprofen Nausea Only    REACTION: Post gastric bypass-->can not take due to surgery.  No allergy. REACTION: Post gastric bypass-->can not take due to  surgery.  No allergy.    Review of Systems  HENT:         (+) Sinus Infection  Eyes:        (+) Eye Dryness (+) Eye Itchiness  Neurological:  Positive for headaches.      Objective:    Physical Exam Constitutional:      General: She is not in acute distress.    Appearance: Normal appearance. She is not ill-appearing.  HENT:     Head: Normocephalic and atraumatic.     Right Ear: External ear normal.     Left Ear: External ear normal.     Nose:     Right Sinus: Maxillary sinus tenderness present. No frontal sinus tenderness.     Left Sinus: Maxillary sinus tenderness present. No frontal sinus tenderness.  Eyes:     Extraocular Movements: Extraocular movements intact.     Pupils: Pupils are equal, round, and reactive to light.  Cardiovascular:     Rate and Rhythm: Normal rate and regular rhythm.     Heart sounds: Normal heart sounds. No murmur heard.   No gallop.  Pulmonary:     Effort: Pulmonary effort is normal. No respiratory distress.     Breath sounds: Normal breath sounds. No wheezing or rales.  Skin:    General: Skin is warm and dry.  Neurological:     Mental Status: She is alert and oriented to person, place, and time.  Psychiatric:        Mood and Affect: Mood normal.        Behavior: Behavior normal.        Judgment: Judgment  normal.    BP (!) 135/51 (BP Location: Right Arm, Patient Position: Sitting, Cuff Size: Large)   Pulse 82   Temp 98.4 F (36.9 C) (Oral)   Resp 16   Wt 216 lb (98 kg)   SpO2 97%   BMI 37.66 kg/m  Wt Readings from Last 3 Encounters:  07/20/21 216 lb (98 kg)  06/14/21 213 lb (96.6 kg)  03/29/21 216 lb 12.8 oz (98.3 kg)       Assessment & Plan:   Problem List Items Addressed This Visit       Unprioritized   Maxillary sinusitis - Primary    New. Pt is advised as follows:  Start augmentin. Continue flonase. You may use diflucan as needed for yeast infection.       Relevant Medications   amoxicillin-clavulanate  (AUGMENTIN) 875-125 MG tablet   fluconazole (DIFLUCAN) 150 MG tablet    Meds ordered this encounter  Medications   DISCONTD: amoxicillin-clavulanate (AUGMENTIN) 875-125 MG tablet    Sig: Take 1 tablet by mouth 2 (two) times daily.    Dispense:  20 tablet    Refill:  0    Order Specific Question:   Supervising Provider    Answer:   Penni Homans A [[id number]]   DISCONTD: fluconazole (DIFLUCAN) 150 MG tablet    Sig: Take 1 tab by mouth as needed for vaginal yeast infection.    Dispense:  1 tablet    Refill:  0    Order Specific Question:   Supervising Provider    Answer:   Penni Homans A [[id number]]   amoxicillin-clavulanate (AUGMENTIN) 875-125 MG tablet    Sig: Take 1 tablet by mouth 2 (two) times daily.    Dispense:  20 tablet    Refill:  0    Order Specific Question:   Supervising Provider    Answer:   Penni Homans A [[id number]]   fluconazole (DIFLUCAN) 150 MG tablet    Sig: Take 1 tab by mouth as needed for vaginal yeast infection.    Dispense:  1 tablet    Refill:  0    Order Specific Question:   Supervising Provider    Answer:   Penni Homans A [[id number]]    I, Nance Pear, NP, personally preformed the services described in this documentation.  All medical record entries made by the scribe were at my direction and in my presence.  I have reviewed the chart and discharge instructions (if applicable) and agree that the record reflects my personal performance and is accurate and complete. 07/20/2021  I,Amber Collins,acting as a scribe for Nance Pear, NP.,have documented all relevant documentation on the behalf of Nance Pear, NP,as directed by  Nance Pear, NP while in the presence of Nance Pear, NP.\   Nance Pear, NP

## 2021-07-20 NOTE — Patient Instructions (Addendum)
Start augmentin. Continue flonase. You may use diflucan as needed for yeast infection.

## 2021-07-20 NOTE — Assessment & Plan Note (Signed)
New. Pt is advised as follows:  Start augmentin. Continue flonase. You may use diflucan as needed for yeast infection.

## 2021-07-30 ENCOUNTER — Other Ambulatory Visit: Payer: Self-pay | Admitting: Family

## 2021-07-30 MED ORDER — ALPRAZOLAM 1 MG PO TABS: ORAL_TABLET | ORAL | 0 refills | Status: DC

## 2021-07-30 NOTE — Telephone Encounter (Signed)
Requesting: alprazolam '1mg'$   Contract: 12/14/20 UDS: 12/14/20 Last Visit: 07/20/21 Next Visit: 12/15/21 Last Refill: 07/02/21 #30 and 0RF  Please Advise

## 2021-08-14 NOTE — Telephone Encounter (Signed)
Last Prolia inj 07/15/21 Next Prolia inj due 01/15/22

## 2021-09-01 ENCOUNTER — Other Ambulatory Visit: Payer: Self-pay | Admitting: Family

## 2021-09-02 MED ORDER — ALPRAZOLAM 1 MG PO TABS: ORAL_TABLET | ORAL | 0 refills | Status: DC

## 2021-09-02 NOTE — Telephone Encounter (Signed)
Requesting: alprazolam '1mg'$  Contract: 12/14/20 UDS: 12/14/20 Last Visit: 06/14/21 Next Visit: 12/15/21 Last Refill: 07/30/21  Please Advise

## 2021-09-11 NOTE — Telephone Encounter (Signed)
Last Prolia inj 07/15/21 Next Prolia inj due 01/15/22

## 2021-09-25 ENCOUNTER — Other Ambulatory Visit: Payer: Self-pay | Admitting: Family

## 2021-09-30 ENCOUNTER — Other Ambulatory Visit: Payer: Self-pay | Admitting: Family

## 2021-09-30 MED ORDER — ALPRAZOLAM 1 MG PO TABS: ORAL_TABLET | ORAL | 0 refills | Status: DC

## 2021-09-30 NOTE — Telephone Encounter (Signed)
Requesting:xanax 1 mg Contract:12/14/20 UDS:12/14/20 Last Visit:07/20/21 Next Visit:12/15/21 Last Refill:09/02/21  Please Advise

## 2021-10-06 DIAGNOSIS — H47011 Ischemic optic neuropathy, right eye: Secondary | ICD-10-CM | POA: Diagnosis not present

## 2021-10-06 DIAGNOSIS — H35363 Drusen (degenerative) of macula, bilateral: Secondary | ICD-10-CM | POA: Diagnosis not present

## 2021-10-06 DIAGNOSIS — H04123 Dry eye syndrome of bilateral lacrimal glands: Secondary | ICD-10-CM | POA: Diagnosis not present

## 2021-10-06 DIAGNOSIS — Z83518 Family history of other specified eye disorder: Secondary | ICD-10-CM | POA: Diagnosis not present

## 2021-10-06 DIAGNOSIS — Q1389 Other congenital malformations of anterior segment of eye: Secondary | ICD-10-CM | POA: Diagnosis not present

## 2021-10-06 DIAGNOSIS — Z961 Presence of intraocular lens: Secondary | ICD-10-CM | POA: Diagnosis not present

## 2021-10-20 DIAGNOSIS — N3281 Overactive bladder: Secondary | ICD-10-CM | POA: Diagnosis not present

## 2021-10-20 DIAGNOSIS — N3000 Acute cystitis without hematuria: Secondary | ICD-10-CM | POA: Diagnosis not present

## 2021-10-31 ENCOUNTER — Other Ambulatory Visit: Payer: Self-pay | Admitting: Family

## 2021-11-01 ENCOUNTER — Encounter: Payer: Self-pay | Admitting: Family

## 2021-11-01 ENCOUNTER — Telehealth: Payer: Self-pay | Admitting: Family

## 2021-11-01 ENCOUNTER — Other Ambulatory Visit (HOSPITAL_BASED_OUTPATIENT_CLINIC_OR_DEPARTMENT_OTHER): Payer: Self-pay

## 2021-11-01 MED ORDER — ALPRAZOLAM 1 MG PO TABS: ORAL_TABLET | ORAL | 0 refills | Status: DC

## 2021-11-01 NOTE — Telephone Encounter (Signed)
Pt came in office stating that she requested refill on ALPRAZolam Duanne Moron) 1 MG tablet to be sent to her pharmacy - rx was sent, but the pharmacy will not give her meds since pt changed her last name on ID and insurance card, Pt brought in copy of ID and insurance card with her new last name Drue Flirt). Pt would like rx to be sent again to her pharmacy with correct last new name. Please advise. (Pharmacy will be :[id number]- [place], [place] - [address] [place] [address] [place] [id number] Phone:  [phone] Fax:  [phone] DEA #:  [id number]

## 2021-11-02 MED ORDER — ALPRAZOLAM 1 MG PO TABS: ORAL_TABLET | ORAL | 0 refills | Status: DC

## 2021-11-03 ENCOUNTER — Encounter (INDEPENDENT_AMBULATORY_CARE_PROVIDER_SITE_OTHER): Payer: Medicare Other | Admitting: Family

## 2021-11-03 DIAGNOSIS — N760 Acute vaginitis: Secondary | ICD-10-CM

## 2021-11-03 MED ORDER — FLUCONAZOLE 150 MG PO TABS: ORAL_TABLET | ORAL | 0 refills | Status: DC

## 2021-11-03 NOTE — Telephone Encounter (Signed)
Please see the MyChart message reply(ies) for my assessment and plan.  The patient gave consent for this Medical Advice Message and is aware that it may result in a bill to their insurance company as well as the possibility that this may result in a co-payment or deductible. They are an established patient, but are not seeking medical advice exclusively about a problem treated during an in person or video visit in the last 7 days. I did not recommend an in person or video visit within 7 days of my reply.  I spent a total of 5 minutes cumulative provider time within 7 days through MyChart messaging.  Zriyah Kopplin S O'Sullivan, NP  

## 2021-11-26 DIAGNOSIS — M79671 Pain in right foot: Secondary | ICD-10-CM | POA: Diagnosis not present

## 2021-11-26 DIAGNOSIS — M19071 Primary osteoarthritis, right ankle and foot: Secondary | ICD-10-CM | POA: Diagnosis not present

## 2021-11-26 DIAGNOSIS — M25571 Pain in right ankle and joints of right foot: Secondary | ICD-10-CM | POA: Diagnosis not present

## 2021-11-29 ENCOUNTER — Other Ambulatory Visit: Payer: Self-pay | Admitting: Family

## 2021-11-30 ENCOUNTER — Other Ambulatory Visit (HOSPITAL_BASED_OUTPATIENT_CLINIC_OR_DEPARTMENT_OTHER): Payer: Self-pay

## 2021-11-30 MED ORDER — ALPRAZOLAM 1 MG PO TABS: ORAL_TABLET | ORAL | 0 refills | Status: DC

## 2021-11-30 NOTE — Telephone Encounter (Signed)
Requesting: alprazolam '1mg'$   Contract:  12/14/20 UDS: 12/14/20 Last Visit: 07/20/21 Next Visit: 12/15/21 Last Refill: 11/02/21  Please Advise

## 2021-12-09 ENCOUNTER — Other Ambulatory Visit (HOSPITAL_BASED_OUTPATIENT_CLINIC_OR_DEPARTMENT_OTHER): Payer: Self-pay

## 2021-12-15 ENCOUNTER — Ambulatory Visit (INDEPENDENT_AMBULATORY_CARE_PROVIDER_SITE_OTHER): Payer: Medicare Other | Admitting: Family

## 2021-12-15 ENCOUNTER — Encounter: Payer: Self-pay | Admitting: Family

## 2021-12-15 ENCOUNTER — Other Ambulatory Visit (HOSPITAL_BASED_OUTPATIENT_CLINIC_OR_DEPARTMENT_OTHER): Payer: Self-pay

## 2021-12-15 VITALS — BP 140/61 | HR 66 | Temp 97.8°F | Resp 16 | Ht 64.0 in | Wt 218.0 lb

## 2021-12-15 DIAGNOSIS — E559 Vitamin D deficiency, unspecified: Secondary | ICD-10-CM | POA: Diagnosis not present

## 2021-12-15 DIAGNOSIS — I10 Essential (primary) hypertension: Secondary | ICD-10-CM

## 2021-12-15 DIAGNOSIS — F419 Anxiety disorder, unspecified: Secondary | ICD-10-CM | POA: Diagnosis not present

## 2021-12-15 DIAGNOSIS — Z0000 Encounter for general adult medical examination without abnormal findings: Secondary | ICD-10-CM

## 2021-12-15 DIAGNOSIS — D649 Anemia, unspecified: Secondary | ICD-10-CM

## 2021-12-15 DIAGNOSIS — Z23 Encounter for immunization: Secondary | ICD-10-CM

## 2021-12-15 DIAGNOSIS — Z1231 Encounter for screening mammogram for malignant neoplasm of breast: Secondary | ICD-10-CM

## 2021-12-15 LAB — CBC WITH DIFFERENTIAL/PLATELET
Basophils Absolute: 0.1 10*3/uL (ref 0.0–0.1)
Basophils Relative: 0.9 % (ref 0.0–3.0)
Eosinophils Absolute: 0.2 10*3/uL (ref 0.0–0.7)
Eosinophils Relative: 3.8 % (ref 0.0–5.0)
HCT: 37.9 % (ref 36.0–46.0)
Hemoglobin: 12.4 g/dL (ref 12.0–15.0)
Lymphocytes Relative: 22 % (ref 12.0–46.0)
Lymphs Abs: 1.3 10*3/uL (ref 0.7–4.0)
MCHC: 32.6 g/dL (ref 30.0–36.0)
MCV: 90 fl (ref 78.0–100.0)
Monocytes Absolute: 0.5 10*3/uL (ref 0.1–1.0)
Monocytes Relative: 7.8 % (ref 3.0–12.0)
Neutro Abs: 3.9 10*3/uL (ref 1.4–7.7)
Neutrophils Relative %: 65.5 % (ref 43.0–77.0)
Platelets: 228 10*3/uL (ref 150.0–400.0)
RBC: 4.21 Mil/uL (ref 3.87–5.11)
RDW: 13.2 % (ref 11.5–15.5)
WBC: 6 10*3/uL (ref 4.0–10.5)

## 2021-12-15 LAB — COMPREHENSIVE METABOLIC PANEL
ALT: 33 U/L (ref 0–35)
AST: 29 U/L (ref 0–37)
Albumin: 4.1 g/dL (ref 3.5–5.2)
Alkaline Phosphatase: 104 U/L (ref 39–117)
BUN: 18 mg/dL (ref 6–23)
CO2: 28 mEq/L (ref 19–32)
Calcium: 9.2 mg/dL (ref 8.4–10.5)
Chloride: 105 mEq/L (ref 96–112)
Creatinine, Ser: 0.43 mg/dL (ref 0.40–1.20)
GFR: 101.91 mL/min (ref >60.00)
Glucose, Bld: 99 mg/dL (ref 70–99)
Potassium: 4 mEq/L (ref 3.5–5.1)
Sodium: 140 mEq/L (ref 135–145)
Total Bilirubin: 0.7 mg/dL (ref 0.2–1.2)
Total Protein: 6.6 g/dL (ref 6.0–8.3)

## 2021-12-15 LAB — LIPID PANEL
Cholesterol: 159 mg/dL (ref 0–200)
HDL: 54.4 mg/dL (ref >39.00)
LDL Cholesterol: 86 mg/dL (ref 0–99)
NonHDL: 104.51
Total CHOL/HDL Ratio: 3
Triglycerides: 94 mg/dL (ref 0.0–149.0)
VLDL: 18.8 mg/dL (ref 0.0–40.0)

## 2021-12-15 LAB — VITAMIN D 25 HYDROXY (VIT D DEFICIENCY, FRACTURES): VITD: 26.42 ng/mL — ABNORMAL LOW (ref 30.00–100.00)

## 2021-12-15 MED ORDER — PRESERVISION AREDS 2 PO CAPS: 1.0000 | ORAL_CAPSULE | Freq: Every day | ORAL | Status: AC

## 2021-12-15 NOTE — Assessment & Plan Note (Signed)
Stable on xanax and prn buspar. Controlled substance contract is updated.

## 2021-12-15 NOTE — Assessment & Plan Note (Signed)
BP Readings from Last 3 Encounters:  12/15/21 (!) 140/61  07/20/21 (!) 135/51  06/14/21 130/82   BP mildly elevated today. Continue lifestyle modification.

## 2021-12-15 NOTE — Assessment & Plan Note (Signed)
Discussed healthy diet, exercise, weight loss. mammo ordered. Colo/dexa up to date. Pap up to date. Flu shot today. She will get covid and tetanus at her pharmacy.

## 2021-12-15 NOTE — Progress Notes (Signed)
Subjective:     Patient ID: [name] [name], female    DOB: May 20, 1955, 66 y.o.   MRN: [id number]  Chief Complaint  Patient presents with   Annual Exam         HPI  Patient is in today for cpx.  Immunizations: Flu shot today, will get covid shot at the pharmacy. Will get tetanus at her pharmacy Diet:  stable, working on weight loss Wt Readings from Last 3 Encounters:  12/15/21 218 lb (98.9 kg)  07/20/21 216 lb (98 kg)  06/14/21 213 lb (96.6 kg)  Exercise: was more active when it is hot, doing water aerobics at the [facility] Colonoscopy: 2018 Dexa: 2022 Pap Smear: 12/14/20 Mammogram:  due Vision: up to date Dental: up to date   Health Maintenance Due  Topic Date Due   Medicare Annual Wellness (AWV)  Never done   COVID-19 Vaccine (3 - Moderna risk series) 08/02/2019    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   Elevated alkaline phosphatase level    negative workup (presumed secondary to fatty liver)   Frequency-urgency syndrome    GERD (gastroesophageal reflux disease)    none since weight loss surgery   Interstitial cystitis    Dr Vernie Shanks   Lymphadenopathy of head and neck 11/09/2018   Neuromuscular disorder ([place])    rt arm carpal tunnel syndrome   Obesity    status post bariatric procedure in [place] 2005   Osteoporosis    Sarcoidosis of lung (HCC)    Thyroid nodule 7/10   `   Thyroid nodule    Varicose vein of leg     Past Surgical History:  Procedure Laterality Date   ANTERIOR CERVICAL DECOMP/DISCECTOMY FUSION N/A 06/21/2012   Procedure: ANTERIOR CERVICAL DECOMPRESSION/DISCECTOMY FUSION C-4 - C5 (SPACER/DePUY CERVICAL PLATE ONLY) 1 LEVEL;  Surgeon: Melina Schools, MD;  Location: [facility];  Service: Orthopedics;  Laterality: N/A;   BLADDER SURGERY  02/21/12 and 02/28/12   Dr Diona Fanti   COLPOSCOPY  1980   CRYOTHERAPY  1980    CYSTO WITH HYDRODISTENSION  12/12/2011   Procedure: CYSTOSCOPY/HYDRODISTENSION;  Surgeon: Franchot Gallo, MD;  Location: [facility];  Service: Urology;  Laterality: N/A;  [place]   bilateral cataract extraction   FOOT SURGERY     bilateral bunionettes   FOOT SURGERY Right 01/07/14   Pt states surgery was due to arthritis. "Has pins in place now"   FOREIGN BODY REMOVAL Right 09/27/2019   Procedure: REMOVAL FOREIGN BOD RIGHT FOOT;  Surgeon: Netta Cedars, MD;  Location: [facility];  Service: Orthopedics;  Laterality: Right;   FRACTURE SURGERY Left 2019   wrist   GASTRIC BYPASS  2005   JOINT REPLACEMENT  2008   left total knee   KNEE SURGERY  1997   left knee   LYMPH NODE DISSECTION Right 11/09/2018   Procedure: EXCISE DEEP RIGHT CERVICAL LYMPH NODE;  Surgeon: Fanny Skates, MD;  Location: [facility];  Service: General;  Laterality: Right;   NASAL SINUS SURGERY  2094   Dr [facility]   REFRACTIVE SURGERY  2006   lasik   REVERSE SHOULDER ARTHROPLASTY Left 09/27/2019   Procedure: REVERSE SHOULDER ARTHROPLASTY;  Surgeon: Netta Cedars, MD;  Location: [facility];  Service: Orthopedics;  Laterality: Left;  2 hrs General with intrascalene block   SHOULDER SURGERY  2 2012   RIGHT    STOMACH SURGERY  2007   "tummy tuck"   TOTAL KNEE ARTHROPLASTY Right 12/31/2015   Procedure: RIGHT TOTAL KNEE ARTHROPLASTY;  Surgeon: [name], MD;  Location: [facility];  Service: Orthopedics;  Laterality: Right;  Adductor Block   VARICOSE VEIN SURGERY Right 02/2012   leg    Family History  Problem Relation Age of Onset   Diabetes Paternal Grandfather    Stroke Paternal Grandfather    Hyperlipidemia Mother    Hypertension Mother    Hypertension Father    Diabetes Father    Prostate cancer Father    Dementia Father    Lung cancer Father    Heart disease Maternal Grandmother    Hypertension Maternal Grandmother    Heart disease Maternal Grandfather    Hypertension Maternal  Grandfather    Stroke Maternal Grandfather    Arthritis Brother    Testicular cancer Brother    Colon cancer Neg Hx    Esophageal cancer Neg Hx    Pancreatic cancer Neg Hx    Stomach cancer Neg Hx     Social History   Socioeconomic History   Marital status: Married    Spouse name: Not on file   Number of children: 1   Years of education: Not on file   Highest education level: Not on file  Occupational History   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[employer]  Tobacco Use   Smoking status: Never   Smokeless tobacco: Never  Vaping Use   Vaping Use: Never used  Substance and Sexual Activity   Alcohol use: Not Currently    Comment: RARE   Drug use: No   Sexual activity: Yes    Comment: 1st intercourse 66 yo-More than 5 partners  Other Topics Concern   Not on file  Social History Narrative   Married    daughter (Bipolar/schizophrenia)   granddaughter   Retired at age 39   Social Determinants of Radio broadcast assistant Strain: Not on file  Food Insecurity: Not on file  Transportation Needs: Not on file  Physical Activity: Not on file  Stress: Not on file  Social Connections: Not on file  Intimate Partner Violence: Not on file    Outpatient Medications Prior to Visit  Medication Sig Dispense Refill   acetaminophen (TYLENOL) 650 MG CR tablet Take 2 tablets (1,300 mg total) by mouth every 8 (eight) hours as needed for pain. Take for mild pain. Do not combine with Percocet. Do not exceed daily recommended limit of Tylenol.     ALPRAZolam (XANAX) 1 MG tablet TAKE 1 TABLET BY MOUTH AT BEDTIME AS NEEDED FOR ANXIETY. 30 tablet 0   amoxicillin-clavulanate (AUGMENTIN) 875-125 MG tablet Take 1 tablet by mouth 2 (two) times daily. 20 tablet 0   Ascorbic Acid (VITAMIN C) 1000 MG tablet Take 1,000 mg by mouth at bedtime.     busPIRone (BUSPAR) 7.5 MG tablet TAKE 1 TABLET BY MOUTH TWICE A DAY 60 tablet 2   Calcium Carb-Cholecalciferol (CALTRATE 600+D3 PO) Take 1  tablet by mouth in the morning and at bedtime.     Cholecalciferol (VITAMIN D3) 75 MCG (3000 UT) TABS Take 1 tablet by mouth daily. 30 tablet    diclofenac Sodium (VOLTAREN) 1 % GEL Apply 2 g topically 2 (two) times daily as needed. 100 g 3   ferrous sulfate 325 (  65 FE) MG tablet Take 325 mg by mouth in the morning and at bedtime.     fexofenadine (ALLEGRA) 180 MG tablet Take 180 mg by mouth at bedtime.     fluconazole (DIFLUCAN) 150 MG tablet Take 1 tab by mouth as needed for vaginal yeast infection.Take 1 tab by mouth as needed for vaginal yeast infection. May repeat dose in 3 days as needed 2 tablet 0   fluticasone (FLONASE) 50 MCG/ACT nasal spray SPRAY 2 SPRAYS INTO EACH NOSTRIL EVERY DAY 16 mL 5   Multiple Vitamin (MULTIVITAMIN WITH MINERALS) TABS tablet Take 1 tablet by mouth at bedtime.     MYRBETRIQ 50 MG TB24 tablet Take 50 mg by mouth at bedtime.   2   pantoprazole (PROTONIX) 40 MG tablet TAKE 1 TABLET BY MOUTH EVERY DAY 90 tablet 1   PARoxetine (PAXIL) 40 MG tablet TAKE 1 TABLET BY MOUTH EVERY DAY IN THE MORNING 30 tablet 5   pilocarpine (SALAGEN) 5 MG tablet TAKE 1 TABLET BY MOUTH IN THE MORNING, AT NOON AND AT BEDTIME 60 tablet 5   sodium chloride (OCEAN) 0.65 % SOLN nasal spray Place 1-2 sprays into both nostrils 4 (four) times daily as needed for congestion.     Sodium Fluoride (PREVIDENT 5000 BOOSTER PLUS) 1.1 % PSTE Use as directed. 100 mL 2   valACYclovir (VALTREX) 500 MG tablet Take 1 tablet (500 mg total) by mouth daily. 90 tablet 1   zinc gluconate 50 MG tablet Take 50 mg by mouth at bedtime.      No facility-administered medications prior to visit.    Allergies  Allergen Reactions   Nitrofurantoin     REACTION: fever   Ibuprofen Nausea Only    REACTION: Post gastric bypass-->can not take due to surgery.  No allergy. REACTION: Post gastric bypass-->can not take due to surgery.  No allergy.    Review of Systems  Constitutional:  Negative for weight loss.  HENT:   Positive for hearing loss (declines audiology referral). Negative for congestion.   Eyes:  Negative for blurred vision.  Respiratory:  Negative for cough.   Cardiovascular:  Negative for leg swelling.  Gastrointestinal:  Negative for constipation and diarrhea.  Genitourinary:  Negative for dysuria and frequency.  Musculoskeletal:  Positive for joint pain (right knee).  Skin:  Negative for rash.  Neurological:  Negative for headaches.  Psychiatric/Behavioral:         Denies depression Does have anxiety sometimes.        Objective:    Physical Exam  BP (!) 140/61   Pulse 66   Temp 97.8 F (36.6 C) (Oral)   Resp 16   Ht _0  (1.626 m)   Wt 218 lb (98.9 kg)   SpO2 99%   BMI 37.42 kg/m  Wt Readings from Last 3 Encounters:  12/15/21 218 lb (98.9 kg)  07/20/21 216 lb (98 kg)  06/14/21 213 lb (96.6 kg)  Physical Exam  Constitutional: She is oriented to person, place, and time. She appears well-developed and well-nourished. No distress.  HENT:  Head: Normocephalic and atraumatic.  Right Ear: Tympanic membrane and ear canal normal.  Left Ear: Tympanic membrane and ear canal normal.  Mouth/Throat: Oropharynx is clear and moist.  Eyes: Pupils are equal, round, and reactive to light. No scleral icterus.  Neck: Normal range of motion. No thyromegaly present.  Cardiovascular: Normal rate and regular rhythm.   No murmur heard. Pulmonary/Chest: Effort normal and breath sounds normal. No respiratory distress.  He has no wheezes. She has no rales. She exhibits no tenderness.  Abdominal: Soft. Bowel sounds are normal. She exhibits no distension and no mass. There is no tenderness. There is no rebound and no guarding.  Musculoskeletal: She exhibits no edema.  Lymphadenopathy:    She has no cervical adenopathy.  Neurological: She is alert and oriented to person, place, and time. She has normal patellar reflexes. She exhibits normal muscle tone. Coordination normal.  Skin: Skin is warm and  dry.  Psychiatric: She has a normal mood and affect. Her behavior is normal. Judgment and thought content normal.  Breast/pelvic: deferred       Assessment & Plan:        Assessment & Plan:   Problem List Items Addressed This Visit       Unprioritized   Vitamin D deficiency   Relevant Orders   Vitamin D (25 hydroxy)   Preventative health care - Primary    Discussed healthy diet, exercise, weight loss. mammo ordered. Colo/dexa up to date. Pap up to date. Flu shot today. She will get covid and tetanus at her pharmacy.       Anxiety    Stable on xanax and prn buspar. Controlled substance contract is updated.       Relevant Orders   DRUG MONITORING, PANEL 8 WITH CONFIRMATION, URINE   Anemia   Relevant Orders   CBC with Differential/Platelet   Other Visit Diagnoses     Needs flu shot       Relevant Orders   Flu Vaccine QUAD High Dose(Fluad) (Completed)   Encounter for screening mammogram for malignant neoplasm of breast       Relevant Orders   MM 3D SCREEN BREAST BILATERAL   Primary hypertension       Relevant Orders   Lipid panel   Comp Met (CMET)       I am having Roslin T. Krenn start on PreserVision AREDS 2. I am also having her maintain her Myrbetriq, acetaminophen, zinc gluconate, multivitamin with minerals, vitamin C, ferrous sulfate, fexofenadine, Calcium Carb-Cholecalciferol (CALTRATE 600+D3 PO), sodium chloride, pilocarpine, valACYclovir, fluticasone, PreviDent 5000 Booster Plus, diclofenac Sodium, Vitamin D3, PARoxetine, amoxicillin-clavulanate, busPIRone, pantoprazole, fluconazole, and ALPRAZolam.  Meds ordered this encounter  Medications   Multiple Vitamins-Minerals (PRESERVISION AREDS 2) CAPS    Sig: Take 1 tablet by mouth daily.    Order Specific Question:   Supervising Provider    Answer:   Penni Homans A [[id number]]

## 2021-12-16 ENCOUNTER — Telehealth: Payer: Self-pay | Admitting: Family

## 2021-12-16 DIAGNOSIS — E559 Vitamin D deficiency, unspecified: Secondary | ICD-10-CM

## 2021-12-16 MED ORDER — VITAMIN D (ERGOCALCIFEROL) 1.25 MG (50000 UNIT) PO CAPS: 50000.0000 [IU] | ORAL_CAPSULE | ORAL | 0 refills | Status: DC

## 2021-12-16 NOTE — Telephone Encounter (Signed)
[facility] called to advise that they received an medical records request for patient for a diabetic eye exam but she sees Dr. Rober Minion at the [facility]. She came to them for a problem. Request would need to be sent to Dr. Danae Chen office.

## 2021-12-16 NOTE — Telephone Encounter (Signed)
Vitamin D level is low.  Advise patient to begin vit D 50000 units once weekly for 12 weeks, then repeat vit D level (dx Vit D deficiency).    Other labs look good.

## 2021-12-16 NOTE — Telephone Encounter (Signed)
Noted, request faxed to Dr. Rober Minion at [phone]

## 2021-12-17 LAB — DRUG MONITORING, PANEL 8 WITH CONFIRMATION, URINE
6 Acetylmorphine: NEGATIVE ng/mL (ref <10)
Alcohol Metabolites: NEGATIVE ng/mL (ref <500)
Alphahydroxyalprazolam: 194 ng/mL — ABNORMAL HIGH (ref <25)
Alphahydroxymidazolam: NEGATIVE ng/mL (ref <50)
Alphahydroxytriazolam: NEGATIVE ng/mL (ref <50)
Aminoclonazepam: NEGATIVE ng/mL (ref <25)
Amphetamines: NEGATIVE ng/mL (ref <500)
Benzodiazepines: POSITIVE ng/mL — AB (ref <100)
Buprenorphine, Urine: NEGATIVE ng/mL (ref <5)
Cocaine Metabolite: NEGATIVE ng/mL (ref <150)
Creatinine: 86.1 mg/dL (ref >=20.0)
Hydroxyethylflurazepam: NEGATIVE ng/mL (ref <50)
Lorazepam: NEGATIVE ng/mL (ref <50)
MDMA: NEGATIVE ng/mL (ref <500)
Marijuana Metabolite: NEGATIVE ng/mL (ref <20)
Nordiazepam: NEGATIVE ng/mL (ref <50)
Opiates: NEGATIVE ng/mL (ref <100)
Oxazepam: NEGATIVE ng/mL (ref <50)
Oxidant: NEGATIVE ug/mL (ref <200)
Oxycodone: NEGATIVE ng/mL (ref <100)
Temazepam: NEGATIVE ng/mL (ref <50)
pH: 7.1 (ref 4.5–9.0)

## 2021-12-17 LAB — DM TEMPLATE

## 2021-12-23 ENCOUNTER — Encounter: Payer: Self-pay | Admitting: Family

## 2021-12-24 DIAGNOSIS — M19071 Primary osteoarthritis, right ankle and foot: Secondary | ICD-10-CM | POA: Diagnosis not present

## 2021-12-31 ENCOUNTER — Other Ambulatory Visit: Payer: Self-pay | Admitting: Family

## 2022-01-03 ENCOUNTER — Other Ambulatory Visit (HOSPITAL_COMMUNITY): Payer: Self-pay

## 2022-01-03 NOTE — Telephone Encounter (Signed)
Prolia VOB initiated via [email or web address] 

## 2022-01-03 NOTE — Telephone Encounter (Signed)
Forwarding to Rx Prior Auth Team 

## 2022-01-04 ENCOUNTER — Other Ambulatory Visit (HOSPITAL_BASED_OUTPATIENT_CLINIC_OR_DEPARTMENT_OTHER): Payer: Self-pay

## 2022-01-04 MED ORDER — COMIRNATY 30 MCG/0.3ML IM SUSY
PREFILLED_SYRINGE | INTRAMUSCULAR | 0 refills | Status: DC
  Filled 2022-01-04: qty 0.3, 1d supply, fill #0

## 2022-01-04 MED ORDER — BOOSTRIX 5-2.5-18.5 LF-MCG/0.5 IM SUSY
PREFILLED_SYRINGE | INTRAMUSCULAR | 0 refills | Status: DC
  Filled 2022-01-04: qty 0.5, 1d supply, fill #0

## 2022-01-05 ENCOUNTER — Other Ambulatory Visit (HOSPITAL_COMMUNITY): Payer: Self-pay

## 2022-01-05 DIAGNOSIS — M1991 Primary osteoarthritis, unspecified site: Secondary | ICD-10-CM | POA: Diagnosis not present

## 2022-01-05 DIAGNOSIS — M810 Age-related osteoporosis without current pathological fracture: Secondary | ICD-10-CM | POA: Diagnosis not present

## 2022-01-05 DIAGNOSIS — D869 Sarcoidosis, unspecified: Secondary | ICD-10-CM | POA: Diagnosis not present

## 2022-01-05 NOTE — Telephone Encounter (Signed)
Resubmitted Prolia VOB via [email or web address]   

## 2022-01-10 ENCOUNTER — Other Ambulatory Visit (HOSPITAL_COMMUNITY): Payer: Self-pay

## 2022-01-11 ENCOUNTER — Other Ambulatory Visit (HOSPITAL_COMMUNITY): Payer: Self-pay

## 2022-01-11 NOTE — Telephone Encounter (Signed)
Pharmacy Patient Advocate Encounter  Insurance verification completed.    The patient is insured through [employer]   Ran test claims for: Prolia 60mg.  Pharmacy benefit copay: $433.85  

## 2022-01-13 DIAGNOSIS — H04123 Dry eye syndrome of bilateral lacrimal glands: Secondary | ICD-10-CM | POA: Diagnosis not present

## 2022-01-15 NOTE — Progress Notes (Unsigned)
ANNUAL EXAM Patient name: [name] [name] MRN [id number]  Date of birth: 28-Oct-1955 Chief Complaint:   No chief complaint on file.  History of Present Illness:   [name] [name] is a 66 y.o. G1P1 female being seen today for a routine annual exam.   Current complaints: ***  Pap History: She had cervical dysplasia over 30 years ago and had normal paps until 2018.  2018: Pap normal, HPV pos 2022: ASCUS/HPV pos > Colpo CIN1, ECC neg  MXR was on 01/17/22 - ***  Health Maintenance Due  Topic Date Due   Medicare Annual Wellness (AWV)  Never done   COVID-19 Vaccine (3 - Moderna risk series) 08/02/2019    Review of Systems:   Pertinent items are noted in HPI Denies any headaches, blurred vision, fatigue, shortness of breath, chest pain, abdominal pain, abnormal vaginal discharge/itching/odor/irritation, problems with periods, bowel movements, urination, or intercourse unless otherwise stated above. *** Pertinent History Reviewed:  Reviewed past medical,surgical, social and family history.  Reviewed problem list, medications and allergies. Physical Assessment:  There were no vitals filed for this visit.There is no height or weight on file to calculate BMI.   Physical Examination:  General appearance - well appearing, and in no distress Mental status - alert, oriented to person, place, and time Psych:  She has a normal mood and affect Skin - warm and dry, normal color, no suspicious lesions noted Chest - effort normal Heart - normal rate  Breasts - breasts appear normal, no suspicious masses, no skin or nipple changes or axillary nodes Abdomen - soft, nontender, nondistended, no masses or organomegaly Pelvic -  VULVA: normal appearing vulva with no masses, tenderness or lesions  VAGINA: normal appearing vagina with normal color and discharge, no lesions  CERVIX: normal appearing cervix without discharge or lesions, no CMT UTERUS: uterus is felt to be normal size, shape,  consistency and nontender  ADNEXA: No adnexal masses or tenderness noted. Extremities:  No swelling or varicosities noted  Chaperone present for exam  No results found for this or any previous visit (from the past 24 hour(s)).      GYNECOLOGY OFFICE COLPOSCOPY PROCEDURE NOTE  66 y.o. G1P1 here for colposcopy for  history of abnormal pap smears. She would like colpscopy and pap smear on the same day.     Pregnancy test:  not indicated  Informed consent and review of risks, benefit and alternatives performed. Written consent given.   Speculum inserted into patient's vagina assuring full view of cervix and vaginal walls. 3 swabs of vinegar solution applied to the cervix and vaginal walls and colposcope was used to observe both the cervix and vaginal walls.   Colposcopy adequate? {yes/no:20286}  {Findings; colposcopy:728}; corresponding biopsies obtained.  ECC collected.  All specimens were labeled and sent to pathology.  Speculum removed. Pt tolerated well with minimal pain and bleeding.   Assessment & Plan:  Diagnoses and all orders for this visit:  Encounter for annual routine gynecological examination  ASCUS with positive high risk HPV cervical  Patient was given post procedure instructions.  Will follow up pathology and manage accordingly; patient will be contacted with results and recommendations.  Routine preventative health maintenance measures emphasized.    - Cervical cancer screening: Discussed guidelines related to screening beyond 65. Based on guidelines, she should have continued screening. Pap with HPV done today along with colposcopy per pt request.  - Breast Health: Encouraged self-breast awareness/SBE. Discussed limits of clinical breast exam for detecting breast  cancer. Discussed importance of annual MXR. {Blank single:19197::"Rx given for [facility] is up to date: ***"} - Colonoscopy: {Blank single:19197::"***","Per PCP","up to date","declines"} - F/U 12  months and prn     No orders of the defined types were placed in this encounter.   Meds: No orders of the defined types were placed in this encounter.   Follow-up: No follow-ups on file.  Radene Gunning, MD 01/15/2022 10:53 PM

## 2022-01-17 ENCOUNTER — Encounter (HOSPITAL_BASED_OUTPATIENT_CLINIC_OR_DEPARTMENT_OTHER): Payer: Self-pay

## 2022-01-17 ENCOUNTER — Ambulatory Visit (HOSPITAL_BASED_OUTPATIENT_CLINIC_OR_DEPARTMENT_OTHER)
Admission: RE | Admit: 2022-01-17 | Discharge: 2022-01-17 | Disposition: A | Discharge status: Home / Self Care | Payer: Medicare Other | Source: Ambulatory Visit | Attending: Family | Admitting: Family

## 2022-01-17 DIAGNOSIS — Z1231 Encounter for screening mammogram for malignant neoplasm of breast: Secondary | ICD-10-CM | POA: Diagnosis not present

## 2022-01-19 ENCOUNTER — Telehealth: Payer: Self-pay

## 2022-01-19 ENCOUNTER — Other Ambulatory Visit (HOSPITAL_COMMUNITY)
Admission: RE | Admit: 2022-01-19 | Discharge: 2022-01-19 | Disposition: A | Discharge status: Home / Self Care | Payer: Medicare Other | Source: Ambulatory Visit | Attending: Obstetrics and Gynecology | Admitting: Obstetrics and Gynecology

## 2022-01-19 ENCOUNTER — Encounter: Payer: Self-pay | Admitting: Obstetrics and Gynecology

## 2022-01-19 ENCOUNTER — Ambulatory Visit: Payer: Medicare Other | Admitting: Obstetrics and Gynecology

## 2022-01-19 VITALS — BP 135/84 | HR 83 | Ht 63.5 in | Wt 222.0 lb

## 2022-01-19 DIAGNOSIS — Z1151 Encounter for screening for human papillomavirus (HPV): Secondary | ICD-10-CM | POA: Insufficient documentation

## 2022-01-19 DIAGNOSIS — R87610 Atypical squamous cells of undetermined significance on cytologic smear of cervix (ASC-US): Secondary | ICD-10-CM

## 2022-01-19 DIAGNOSIS — R87810 Cervical high risk human papillomavirus (HPV) DNA test positive: Secondary | ICD-10-CM

## 2022-01-19 DIAGNOSIS — Z01419 Encounter for gynecological examination (general) (routine) without abnormal findings: Secondary | ICD-10-CM

## 2022-01-19 DIAGNOSIS — N760 Acute vaginitis: Secondary | ICD-10-CM | POA: Diagnosis not present

## 2022-01-19 MED ORDER — FLUCONAZOLE 150 MG PO TABS: 150.0000 mg | ORAL_TABLET | ORAL | 3 refills | Status: DC

## 2022-01-19 NOTE — Telephone Encounter (Signed)
error 

## 2022-01-20 ENCOUNTER — Other Ambulatory Visit (HOSPITAL_COMMUNITY): Payer: Self-pay

## 2022-01-20 ENCOUNTER — Encounter: Payer: Self-pay | Admitting: Family

## 2022-01-20 DIAGNOSIS — G894 Chronic pain syndrome: Secondary | ICD-10-CM

## 2022-01-20 LAB — SURGICAL PATHOLOGY

## 2022-01-21 LAB — CYTOLOGY - PAP
Comment: NEGATIVE
Diagnosis: NEGATIVE
Diagnosis: REACTIVE
High risk HPV: NEGATIVE

## 2022-01-24 ENCOUNTER — Other Ambulatory Visit (HOSPITAL_BASED_OUTPATIENT_CLINIC_OR_DEPARTMENT_OTHER): Payer: Self-pay

## 2022-01-26 DIAGNOSIS — M19071 Primary osteoarthritis, right ankle and foot: Secondary | ICD-10-CM | POA: Diagnosis not present

## 2022-01-31 ENCOUNTER — Encounter: Payer: Self-pay | Admitting: Family

## 2022-01-31 ENCOUNTER — Other Ambulatory Visit: Payer: Self-pay | Admitting: Family

## 2022-01-31 MED ORDER — ALPRAZOLAM 1 MG PO TABS: ORAL_TABLET | ORAL | 0 refills | Status: DC

## 2022-01-31 NOTE — Telephone Encounter (Signed)
Requesting: Alprazolam '1mg'$  Contract: 12/15/21 UDS: 12/15/21 Last Visit: 12/15/21 Next Visit: 06/15/22 Last Refill: 11/30/21  Please Advise

## 2022-01-31 NOTE — Telephone Encounter (Signed)
Requesting: alprazolam '1mg'$   Contract: 12/14/20 UDS: 12/15/21 Last Visit: 12/15/21 Next Visit: 06/15/22 Last Refill: 11/30/21 #30 and 0RF   Please Advise

## 2022-02-01 ENCOUNTER — Other Ambulatory Visit: Payer: Self-pay | Admitting: Family

## 2022-02-01 ENCOUNTER — Other Ambulatory Visit (HOSPITAL_BASED_OUTPATIENT_CLINIC_OR_DEPARTMENT_OTHER): Payer: Self-pay

## 2022-02-01 MED ORDER — SODIUM FLUORIDE 5000 PPM 1.1 % DT PSTE
PASTE | DENTAL | 2 refills | Status: DC
  Filled 2022-02-01: qty 100, 30d supply, fill #0
  Filled 2022-03-07: qty 100, 30d supply, fill #1
  Filled 2022-07-20: qty 100, 30d supply, fill #2

## 2022-02-02 DIAGNOSIS — G5681 Other specified mononeuropathies of right upper limb: Secondary | ICD-10-CM | POA: Diagnosis not present

## 2022-02-02 DIAGNOSIS — D869 Sarcoidosis, unspecified: Secondary | ICD-10-CM | POA: Diagnosis not present

## 2022-02-02 DIAGNOSIS — M25511 Pain in right shoulder: Secondary | ICD-10-CM | POA: Diagnosis not present

## 2022-02-02 DIAGNOSIS — G894 Chronic pain syndrome: Secondary | ICD-10-CM | POA: Diagnosis not present

## 2022-02-02 DIAGNOSIS — G8929 Other chronic pain: Secondary | ICD-10-CM | POA: Diagnosis not present

## 2022-02-10 ENCOUNTER — Other Ambulatory Visit (HOSPITAL_COMMUNITY): Payer: Self-pay

## 2022-02-15 ENCOUNTER — Other Ambulatory Visit: Payer: Self-pay | Admitting: Family

## 2022-02-16 ENCOUNTER — Other Ambulatory Visit (HOSPITAL_BASED_OUTPATIENT_CLINIC_OR_DEPARTMENT_OTHER): Payer: Self-pay

## 2022-02-16 MED ORDER — AREXVY 120 MCG/0.5ML IM SUSR
INTRAMUSCULAR | 0 refills | Status: DC
  Filled 2022-02-16: qty 1, 1d supply, fill #0

## 2022-02-19 ENCOUNTER — Other Ambulatory Visit: Payer: Self-pay | Admitting: Family

## 2022-02-21 ENCOUNTER — Telehealth: Payer: Medicare Other | Admitting: Emergency Medicine

## 2022-02-21 DIAGNOSIS — J019 Acute sinusitis, unspecified: Secondary | ICD-10-CM

## 2022-02-21 DIAGNOSIS — B9689 Other specified bacterial agents as the cause of diseases classified elsewhere: Secondary | ICD-10-CM

## 2022-02-21 MED ORDER — AMOXICILLIN-POT CLAVULANATE 875-125 MG PO TABS: 1.0000 | ORAL_TABLET | Freq: Two times a day (BID) | ORAL | 0 refills | Status: DC

## 2022-02-21 NOTE — Progress Notes (Signed)
E-Visit for Sinus Problems  We are sorry that you are not feeling well.  Here is how we plan to help!  Based on what you have shared with me it looks like you have sinusitis.  Sinusitis is inflammation and infection in the sinus cavities of the head.  Based on your presentation I believe you most likely have Acute Bacterial Sinusitis.  This is an infection caused by bacteria and is treated with antibiotics. I have prescribed Augmentin '875mg'$ /'125mg'$  one tablet twice daily with food, for 7 days.   You may use an oral decongestant such as Mucinex D or if you have glaucoma or high blood pressure use plain Mucinex.   Saline nasal spray help and can safely be used as often as needed for congestion.  Try using saline irrigation, such as with a neti pot, several times a day while you are sick. Many neti pots come with salt packets premeasured to use to make saline. If you use your own salt, make sure it is kosher salt or sea salt (don't use table salt as it has iodine in it and you don't need that in your nose). Use distilled water to make saline. If you mix your own saline using your own salt, the recipe is 1/4 teaspoon salt in 1 cup warm water. Using saline irrigation can help prevent and treat sinus infections.   If you develop worsening sinus pain, fever or notice severe headache and vision changes, or if symptoms are not better after completion of antibiotic, please schedule an appointment with a health care provider.    Sinus infections are not as easily transmitted as other respiratory infection, however we still recommend that you avoid close contact with loved ones, especially the very young and elderly.  Remember to wash your hands thoroughly throughout the day as this is the number one way to prevent the spread of infection!  Home Care: Only take medications as instructed by your medical team. Complete the entire course of an antibiotic. Do not take these medications with alcohol. A steam or  ultrasonic humidifier can help congestion.  You can place a towel over your head and breathe in the steam from hot water coming from a faucet. Avoid close contacts especially the very young and the elderly. Cover your mouth when you cough or sneeze. Always remember to wash your hands.  Get Help Right Away If: You develop worsening fever or sinus pain. You develop a severe head ache or visual changes. Your symptoms persist after you have completed your treatment plan.  Make sure you Understand these instructions. Will watch your condition. Will get help right away if you are not doing well or get worse.  Thank you for choosing an e-visit.  Your e-visit answers were reviewed by a board certified advanced clinical practitioner to complete your personal care plan. Depending upon the condition, your plan could have included both over the counter or prescription medications.  Please review your pharmacy choice. Make sure the pharmacy is open so you can pick up prescription now. If there is a problem, you may contact your provider through [employer] and have the prescription routed to another pharmacy.  Your safety is important to [place]. If you have drug allergies check your prescription carefully.   For the next 24 hours you can use MyChart to ask questions about today's visit, request a non-urgent call back, or ask for a work or school excuse. You will get an email in the next two days asking  about your experience. I hope that your e-visit has been valuable and will speed your recovery.  I have spent 5 minutes in review of e-visit questionnaire, review and updating patient chart, medical decision making and response to patient.   Willeen Cass, PhD, FNP-BC

## 2022-02-22 ENCOUNTER — Other Ambulatory Visit: Payer: Self-pay | Admitting: Family

## 2022-02-22 DIAGNOSIS — E559 Vitamin D deficiency, unspecified: Secondary | ICD-10-CM

## 2022-02-22 NOTE — Telephone Encounter (Signed)
Pt is due for vit D lab follow up prior to additional vit D refills.

## 2022-02-23 NOTE — Telephone Encounter (Signed)
Patient has one more tablet for Monday 1/15, was scheduled for labs 03/07/22

## 2022-03-04 ENCOUNTER — Telehealth: Payer: Medicare Other | Admitting: Family Medicine

## 2022-03-04 DIAGNOSIS — J329 Chronic sinusitis, unspecified: Secondary | ICD-10-CM

## 2022-03-04 MED ORDER — DOXYCYCLINE HYCLATE 100 MG PO TABS: 100.0000 mg | ORAL_TABLET | Freq: Two times a day (BID) | ORAL | 0 refills | Status: AC

## 2022-03-04 NOTE — Progress Notes (Signed)

## 2022-03-07 ENCOUNTER — Other Ambulatory Visit: Payer: Medicare Other

## 2022-03-07 ENCOUNTER — Encounter: Payer: Self-pay | Admitting: Family

## 2022-03-07 ENCOUNTER — Other Ambulatory Visit (INDEPENDENT_AMBULATORY_CARE_PROVIDER_SITE_OTHER): Payer: Medicare Other

## 2022-03-07 DIAGNOSIS — E559 Vitamin D deficiency, unspecified: Secondary | ICD-10-CM

## 2022-03-07 LAB — VITAMIN D 25 HYDROXY (VIT D DEFICIENCY, FRACTURES): VITD: 25.32 ng/mL — ABNORMAL LOW (ref 30.00–100.00)

## 2022-03-07 MED ORDER — PAROXETINE HCL 40 MG PO TABS: 40.0000 mg | ORAL_TABLET | ORAL | 5 refills | Status: DC

## 2022-03-07 MED ORDER — ALPRAZOLAM 1 MG PO TABS: ORAL_TABLET | ORAL | 0 refills | Status: DC

## 2022-03-07 NOTE — Telephone Encounter (Addendum)
Requesting: alprazolam '1mg'$  Contract:12/23/21 UDS:12/15/21 Last Visit: 12/15/21 Next Visit: 06/15/22 Last Refill: 01/31/22 #30 and 0RF    Please Advise

## 2022-03-09 ENCOUNTER — Telehealth: Payer: Self-pay | Admitting: Family

## 2022-03-09 NOTE — Progress Notes (Deleted)
Vit D level is still a little low. I would like her to continue weekly vit D 50000 iu.  Is she also taking vit D 3000 iu daily otc?  If not please start.  If she is taking the 3000 iu, then I would like her to increase to 5000 iu daily.

## 2022-03-09 NOTE — Telephone Encounter (Signed)
Called a few times but no answer and no voice mail

## 2022-03-09 NOTE — Telephone Encounter (Signed)
Vit D level is still a little low. I would like her to continue weekly vit D 50000 iu. Is she also taking vit D 3000 iu daily otc? If not please start. If she is taking the 3000 iu, then I would like her to increase to 5000 iu daily.

## 2022-03-10 ENCOUNTER — Encounter: Payer: Self-pay | Admitting: Family

## 2022-03-10 ENCOUNTER — Other Ambulatory Visit (HOSPITAL_BASED_OUTPATIENT_CLINIC_OR_DEPARTMENT_OTHER): Payer: Self-pay

## 2022-03-10 DIAGNOSIS — E559 Vitamin D deficiency, unspecified: Secondary | ICD-10-CM

## 2022-03-10 MED ORDER — VITAMIN D (ERGOCALCIFEROL) 1.25 MG (50000 UNIT) PO CAPS: 50000.0000 [IU] | ORAL_CAPSULE | ORAL | 1 refills | Status: DC

## 2022-03-10 NOTE — Addendum Note (Signed)
Addended by: [name] on: 03/10/2022 02:05 PM   Modules accepted: Orders

## 2022-03-11 ENCOUNTER — Encounter: Payer: Self-pay | Admitting: Family

## 2022-03-11 MED ORDER — PILOCARPINE HCL 5 MG PO TABS: ORAL_TABLET | ORAL | 5 refills | Status: DC

## 2022-03-11 NOTE — Telephone Encounter (Signed)
Lvm again today

## 2022-03-15 NOTE — Telephone Encounter (Signed)
Patient received information on mychart

## 2022-03-17 HISTORY — PX: ORIF DISTAL RADIUS FRACTURE: SUR927

## 2022-03-24 DIAGNOSIS — G894 Chronic pain syndrome: Secondary | ICD-10-CM | POA: Diagnosis not present

## 2022-03-24 DIAGNOSIS — M25511 Pain in right shoulder: Secondary | ICD-10-CM | POA: Diagnosis not present

## 2022-03-24 DIAGNOSIS — G5681 Other specified mononeuropathies of right upper limb: Secondary | ICD-10-CM | POA: Diagnosis not present

## 2022-04-04 ENCOUNTER — Other Ambulatory Visit: Payer: Self-pay | Admitting: Family

## 2022-04-06 ENCOUNTER — Telehealth: Payer: Self-pay | Admitting: Family

## 2022-04-06 DIAGNOSIS — J329 Chronic sinusitis, unspecified: Secondary | ICD-10-CM | POA: Diagnosis not present

## 2022-04-06 DIAGNOSIS — J3489 Other specified disorders of nose and nasal sinuses: Secondary | ICD-10-CM | POA: Diagnosis not present

## 2022-04-06 NOTE — Telephone Encounter (Signed)
Contacted [name] [name] to schedule their annual wellness visit. Appointment made for 04/18/2022.  [facility] Direct Dial: [phone]

## 2022-04-08 ENCOUNTER — Emergency Department (HOSPITAL_COMMUNITY): Payer: Medicare Other

## 2022-04-08 ENCOUNTER — Emergency Department (HOSPITAL_COMMUNITY)
Admission: EM | Admit: 2022-04-08 | Discharge: 2022-04-08 | Disposition: A | Discharge status: Home / Self Care | Payer: Medicare Other | Attending: Emergency Medicine | Admitting: Emergency Medicine

## 2022-04-08 ENCOUNTER — Encounter (HOSPITAL_COMMUNITY): Payer: Self-pay

## 2022-04-08 ENCOUNTER — Other Ambulatory Visit: Payer: Self-pay

## 2022-04-08 DIAGNOSIS — R519 Headache, unspecified: Secondary | ICD-10-CM | POA: Insufficient documentation

## 2022-04-08 DIAGNOSIS — S20219A Contusion of unspecified front wall of thorax, initial encounter: Secondary | ICD-10-CM | POA: Insufficient documentation

## 2022-04-08 DIAGNOSIS — M25531 Pain in right wrist: Secondary | ICD-10-CM | POA: Diagnosis not present

## 2022-04-08 DIAGNOSIS — S52501A Unspecified fracture of the lower end of right radius, initial encounter for closed fracture: Secondary | ICD-10-CM | POA: Diagnosis not present

## 2022-04-08 DIAGNOSIS — R609 Edema, unspecified: Secondary | ICD-10-CM | POA: Diagnosis not present

## 2022-04-08 DIAGNOSIS — Z743 Need for continuous supervision: Secondary | ICD-10-CM | POA: Diagnosis not present

## 2022-04-08 DIAGNOSIS — Z041 Encounter for examination and observation following transport accident: Secondary | ICD-10-CM | POA: Diagnosis not present

## 2022-04-08 DIAGNOSIS — R0789 Other chest pain: Secondary | ICD-10-CM | POA: Diagnosis not present

## 2022-04-08 DIAGNOSIS — Y92410 Unspecified street and highway as the place of occurrence of the external cause: Secondary | ICD-10-CM | POA: Insufficient documentation

## 2022-04-08 DIAGNOSIS — S0990XA Unspecified injury of head, initial encounter: Secondary | ICD-10-CM | POA: Diagnosis not present

## 2022-04-08 MED ORDER — HYDROCODONE-ACETAMINOPHEN 10-325 MG PO TABS
1.0000 | ORAL_TABLET | Freq: Once | ORAL | Status: AC
  Administered 2022-04-08: 1 via ORAL
  Filled 2022-04-08: qty 1

## 2022-04-08 MED ORDER — ACETAMINOPHEN 325 MG PO TABS
650.0000 mg | ORAL_TABLET | Freq: Once | ORAL | Status: AC
  Administered 2022-04-08: 650 mg via ORAL
  Filled 2022-04-08: qty 2

## 2022-04-08 MED ORDER — MORPHINE SULFATE 15 MG PO TABS: 7.5000 mg | ORAL_TABLET | ORAL | 0 refills | Status: DC | PRN

## 2022-04-08 NOTE — Progress Notes (Signed)
Orthopedic Tech Progress Note Patient Details:  [name] [name] February 27, 1955 [id number] Padded sugartong splint was applied in triage. Patient stated that the splint was comfortable and that it made her wrist feel much better. Patient was advised to keep the splint dry at all times until her follow-up appointment with her orthopedic doctor.  Ortho Devices Type of Ortho Device: Sugartong splint, Arm sling Ortho Device/Splint Location: Right arm Ortho Device/Splint Interventions: Application   Post Interventions Patient Tolerated: Well  Linus Salmons [name] 04/08/2022, 7:51 PM
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
## 2022-04-08 NOTE — Discharge Instructions (Addendum)

## 2022-04-12 DIAGNOSIS — S52514A Nondisplaced fracture of right radial styloid process, initial encounter for closed fracture: Secondary | ICD-10-CM | POA: Diagnosis not present

## 2022-04-13 ENCOUNTER — Telehealth: Payer: Self-pay

## 2022-04-13 DIAGNOSIS — S52571A Other intraarticular fracture of lower end of right radius, initial encounter for closed fracture: Secondary | ICD-10-CM | POA: Diagnosis not present

## 2022-04-13 NOTE — Telephone Encounter (Signed)
     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  Have you been able to follow up with your primary care physician? Yes  The patient was or was not able to obtain any needed medicine or equipment. Patient was able to obtain medication.  Are there diet recommendations that you are having difficulty following? No  Patient expresses understanding of discharge instructions and education provided has no other needs at this time. Yes   [facility] Resource Care Guide   [email or web address]  ?? [id number]   Website: [email or web address]  [email or web address]

## 2022-04-18 ENCOUNTER — Encounter: Payer: Self-pay | Admitting: Family

## 2022-04-21 DIAGNOSIS — S52501D Unspecified fracture of the lower end of right radius, subsequent encounter for closed fracture with routine healing: Secondary | ICD-10-CM | POA: Diagnosis not present

## 2022-04-30 ENCOUNTER — Other Ambulatory Visit: Payer: Self-pay | Admitting: Family

## 2022-05-02 ENCOUNTER — Encounter: Payer: Self-pay | Admitting: Family

## 2022-05-02 ENCOUNTER — Other Ambulatory Visit: Payer: Self-pay | Admitting: Family

## 2022-05-02 MED ORDER — ALPRAZOLAM 1 MG PO TABS: ORAL_TABLET | ORAL | 0 refills | Status: DC

## 2022-05-02 NOTE — Telephone Encounter (Signed)
Requesting: alprazolam 1mg   Contract: 12/14/20 UDS: 12/15/21 Last Visit: 12/15/21 Next Visit: 06/15/22 Last Refill: 04/05/22 #30 and 0RF   Please Advise

## 2022-05-12 DIAGNOSIS — M25631 Stiffness of right wrist, not elsewhere classified: Secondary | ICD-10-CM | POA: Diagnosis not present

## 2022-05-12 DIAGNOSIS — S52501A Unspecified fracture of the lower end of right radius, initial encounter for closed fracture: Secondary | ICD-10-CM | POA: Diagnosis not present

## 2022-05-19 ENCOUNTER — Telehealth: Payer: Self-pay | Admitting: Family

## 2022-05-19 DIAGNOSIS — M25631 Stiffness of right wrist, not elsewhere classified: Secondary | ICD-10-CM | POA: Diagnosis not present

## 2022-05-19 NOTE — Telephone Encounter (Signed)
Contacted [name] [name] to schedule their annual wellness visit. Appointment made for 05/30/2022.  Sherol Dade; Care Guide Ambulatory Clinical [facility] Direct Dial: [phone]

## 2022-05-24 ENCOUNTER — Telehealth: Payer: Medicare Other | Admitting: Family Medicine

## 2022-05-24 DIAGNOSIS — B9689 Other specified bacterial agents as the cause of diseases classified elsewhere: Secondary | ICD-10-CM | POA: Diagnosis not present

## 2022-05-24 DIAGNOSIS — J019 Acute sinusitis, unspecified: Secondary | ICD-10-CM

## 2022-05-24 MED ORDER — AMOXICILLIN-POT CLAVULANATE 875-125 MG PO TABS: 1.0000 | ORAL_TABLET | Freq: Two times a day (BID) | ORAL | 0 refills | Status: AC

## 2022-05-24 NOTE — Progress Notes (Signed)
E-Visit for Sinus Problems  We are sorry that you are not feeling well.  Here is how we plan to help!  Based on what you have shared with me it looks like you have sinusitis.  Sinusitis is inflammation and infection in the sinus cavities of the head.  Based on your presentation I believe you most likely have Acute Bacterial Sinusitis.  This is an infection caused by bacteria and is treated with antibiotics. I have prescribed Augmentin for 10 days-as you have reported in the past.  I am worried that you have had to use several courses of medication so far in the last few months.  If this does not improve with this, you will have to be seen in person.   You may use an oral decongestant such as Mucinex D or if you have glaucoma or high blood pressure use plain Mucinex. Saline nasal spray help and can safely be used as often as needed for congestion.  If you develop worsening sinus pain, fever or notice severe headache and vision changes, or if symptoms are not better after completion of antibiotic, please schedule an appointment with a health care provider.    Sinus infections are not as easily transmitted as other respiratory infection, however we still recommend that you avoid close contact with loved ones, especially the very young and elderly.  Remember to wash your hands thoroughly throughout the day as this is the number one way to prevent the spread of infection!  Home Care: Only take medications as instructed by your medical team. Complete the entire course of an antibiotic. Do not take these medications with alcohol. A steam or ultrasonic humidifier can help congestion.  You can place a towel over your head and breathe in the steam from hot water coming from a faucet. Avoid close contacts especially the very young and the elderly. Cover your mouth when you cough or sneeze. Always remember to wash your hands.  Get Help Right Away If: You develop worsening fever or sinus pain. You develop a  severe head ache or visual changes. Your symptoms persist after you have completed your treatment plan.  Make sure you Understand these instructions. Will watch your condition. Will get help right away if you are not doing well or get worse.  Thank you for choosing an e-visit.  Your e-visit answers were reviewed by a board certified advanced clinical practitioner to complete your personal care plan. Depending upon the condition, your plan could have included both over the counter or prescription medications.  Please review your pharmacy choice. Make sure the pharmacy is open so you can pick up prescription now. If there is a problem, you may contact your provider through [employer] and have the prescription routed to another pharmacy.  Your safety is important to [place]. If you have drug allergies check your prescription carefully.   For the next 24 hours you can use MyChart to ask questions about today's visit, request a non-urgent call back, or ask for a work or school excuse. You will get an email in the next two days asking about your experience. I hope that your e-visit has been valuable and will speed your recovery.  I provided 5 minutes of non face-to-face time during this encounter for chart review, medication and order placement, as well as and documentation.

## 2022-05-24 NOTE — Addendum Note (Signed)
Addended by: Freddy Finner on: 05/24/2022 11:59 AM   Modules accepted: Orders

## 2022-05-25 DIAGNOSIS — M25641 Stiffness of right hand, not elsewhere classified: Secondary | ICD-10-CM | POA: Diagnosis not present

## 2022-05-26 DIAGNOSIS — M542 Cervicalgia: Secondary | ICD-10-CM | POA: Diagnosis not present

## 2022-05-27 ENCOUNTER — Other Ambulatory Visit: Payer: Self-pay | Admitting: Family

## 2022-06-03 ENCOUNTER — Other Ambulatory Visit: Payer: Self-pay | Admitting: Family

## 2022-06-03 ENCOUNTER — Encounter: Payer: Self-pay | Admitting: Family

## 2022-06-03 MED ORDER — ALPRAZOLAM 1 MG PO TABS: 1.0000 mg | ORAL_TABLET | Freq: Two times a day (BID) | ORAL | 0 refills | Status: DC | PRN

## 2022-06-07 DIAGNOSIS — M25631 Stiffness of right wrist, not elsewhere classified: Secondary | ICD-10-CM | POA: Diagnosis not present

## 2022-06-09 DIAGNOSIS — S52501D Unspecified fracture of the lower end of right radius, subsequent encounter for closed fracture with routine healing: Secondary | ICD-10-CM | POA: Diagnosis not present

## 2022-06-14 DIAGNOSIS — M25631 Stiffness of right wrist, not elsewhere classified: Secondary | ICD-10-CM | POA: Diagnosis not present

## 2022-06-15 ENCOUNTER — Telehealth: Payer: Self-pay | Admitting: Family

## 2022-06-15 ENCOUNTER — Ambulatory Visit (INDEPENDENT_AMBULATORY_CARE_PROVIDER_SITE_OTHER): Payer: Medicare Other | Admitting: Family

## 2022-06-15 VITALS — BP 144/100 | HR 80 | Temp 97.9°F | Resp 16 | Wt 216.0 lb

## 2022-06-15 DIAGNOSIS — N3281 Overactive bladder: Secondary | ICD-10-CM

## 2022-06-15 DIAGNOSIS — K219 Gastro-esophageal reflux disease without esophagitis: Secondary | ICD-10-CM

## 2022-06-15 DIAGNOSIS — F419 Anxiety disorder, unspecified: Secondary | ICD-10-CM | POA: Diagnosis not present

## 2022-06-15 DIAGNOSIS — J309 Allergic rhinitis, unspecified: Secondary | ICD-10-CM

## 2022-06-15 DIAGNOSIS — R87610 Atypical squamous cells of undetermined significance on cytologic smear of cervix (ASC-US): Secondary | ICD-10-CM

## 2022-06-15 DIAGNOSIS — M810 Age-related osteoporosis without current pathological fracture: Secondary | ICD-10-CM | POA: Diagnosis not present

## 2022-06-15 DIAGNOSIS — E559 Vitamin D deficiency, unspecified: Secondary | ICD-10-CM

## 2022-06-15 DIAGNOSIS — I10 Essential (primary) hypertension: Secondary | ICD-10-CM

## 2022-06-15 LAB — BASIC METABOLIC PANEL
BUN: 20 mg/dL (ref 6–23)
CO2: 24 mEq/L (ref 19–32)
Calcium: 9.3 mg/dL (ref 8.4–10.5)
Chloride: 106 mEq/L (ref 96–112)
Creatinine, Ser: 0.51 mg/dL (ref 0.40–1.20)
GFR: 97.47 mL/min (ref >60.00)
Glucose, Bld: 100 mg/dL — ABNORMAL HIGH (ref 70–99)
Potassium: 3.9 mEq/L (ref 3.5–5.1)
Sodium: 140 mEq/L (ref 135–145)

## 2022-06-15 LAB — VITAMIN D 25 HYDROXY (VIT D DEFICIENCY, FRACTURES): VITD: 30.96 ng/mL (ref 30.00–100.00)

## 2022-06-15 NOTE — Assessment & Plan Note (Addendum)
BP Readings from Last 3 Encounters:  06/15/22 (!) 147/76  04/08/22 (!) 191/93  01/19/22 135/84   BP is elevated today. She feels it is stress related. Will bring her back in 2 weeks for repeat BP check and plan to treat if still elevated.

## 2022-06-15 NOTE — Assessment & Plan Note (Addendum)
Overall anxiety has been better.  She is using the xanax some nights. Controlled substance contract and UDS are up to date.

## 2022-06-15 NOTE — Assessment & Plan Note (Signed)
Wt Readings from Last 3 Encounters:  06/15/22 216 lb (98 kg)  04/08/22 220 lb (99.8 kg)  01/19/22 222 lb (100.7 kg)   Weight is down a few pounds.

## 2022-06-15 NOTE — Assessment & Plan Note (Signed)
Due for prolia shot, will arrange.

## 2022-06-15 NOTE — Assessment & Plan Note (Signed)
Stable on pantoprazole

## 2022-06-15 NOTE — Assessment & Plan Note (Signed)
Stable on Myrbetriq which is being rx'd by Urology.

## 2022-06-15 NOTE — Assessment & Plan Note (Signed)
Recheck level 

## 2022-06-15 NOTE — Telephone Encounter (Signed)
Can you please schedule prolia injection? I am not sure where to send this request, tks!

## 2022-06-15 NOTE — Assessment & Plan Note (Signed)
She is following with [facility] for this.

## 2022-06-15 NOTE — Assessment & Plan Note (Signed)
Notes sneezing, continues allegra/flonase.

## 2022-06-15 NOTE — Telephone Encounter (Signed)
Are you able to run benefits for the pts Prolia? Looks like she is due, missed her last injection for some reason.
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
## 2022-06-16 ENCOUNTER — Other Ambulatory Visit: Payer: Self-pay | Admitting: Family

## 2022-06-16 ENCOUNTER — Encounter: Payer: Self-pay | Admitting: Family

## 2022-06-16 DIAGNOSIS — M25631 Stiffness of right wrist, not elsewhere classified: Secondary | ICD-10-CM | POA: Diagnosis not present

## 2022-06-17 DIAGNOSIS — M542 Cervicalgia: Secondary | ICD-10-CM | POA: Diagnosis not present

## 2022-06-23 DIAGNOSIS — M542 Cervicalgia: Secondary | ICD-10-CM | POA: Diagnosis not present

## 2022-06-23 DIAGNOSIS — M25631 Stiffness of right wrist, not elsewhere classified: Secondary | ICD-10-CM | POA: Diagnosis not present

## 2022-06-25 ENCOUNTER — Other Ambulatory Visit: Payer: Self-pay | Admitting: Family

## 2022-06-27 ENCOUNTER — Telehealth: Payer: Self-pay

## 2022-06-27 ENCOUNTER — Other Ambulatory Visit (HOSPITAL_COMMUNITY): Payer: Self-pay

## 2022-06-27 DIAGNOSIS — M25631 Stiffness of right wrist, not elsewhere classified: Secondary | ICD-10-CM | POA: Diagnosis not present

## 2022-06-27 DIAGNOSIS — M542 Cervicalgia: Secondary | ICD-10-CM | POA: Diagnosis not present

## 2022-06-27 NOTE — Telephone Encounter (Signed)
Prolia VOB initiated via [email or web address] 

## 2022-06-27 NOTE — Telephone Encounter (Signed)
Checking status of this request.

## 2022-06-28 ENCOUNTER — Other Ambulatory Visit (HOSPITAL_COMMUNITY): Payer: Self-pay

## 2022-06-28 NOTE — Telephone Encounter (Signed)
Submitted BIV via [employer]. Came back policy terminated. Please advise.

## 2022-06-30 DIAGNOSIS — Z83518 Family history of other specified eye disorder: Secondary | ICD-10-CM | POA: Diagnosis not present

## 2022-06-30 DIAGNOSIS — H47011 Ischemic optic neuropathy, right eye: Secondary | ICD-10-CM | POA: Diagnosis not present

## 2022-06-30 DIAGNOSIS — H5203 Hypermetropia, bilateral: Secondary | ICD-10-CM | POA: Diagnosis not present

## 2022-06-30 DIAGNOSIS — H35363 Drusen (degenerative) of macula, bilateral: Secondary | ICD-10-CM | POA: Diagnosis not present

## 2022-06-30 DIAGNOSIS — H16223 Keratoconjunctivitis sicca, not specified as Sjogren's, bilateral: Secondary | ICD-10-CM | POA: Diagnosis not present

## 2022-06-30 DIAGNOSIS — H40003 Preglaucoma, unspecified, bilateral: Secondary | ICD-10-CM | POA: Diagnosis not present

## 2022-06-30 DIAGNOSIS — H40023 Open angle with borderline findings, high risk, bilateral: Secondary | ICD-10-CM | POA: Diagnosis not present

## 2022-06-30 DIAGNOSIS — H52203 Unspecified astigmatism, bilateral: Secondary | ICD-10-CM | POA: Diagnosis not present

## 2022-06-30 DIAGNOSIS — H524 Presbyopia: Secondary | ICD-10-CM | POA: Diagnosis not present

## 2022-06-30 DIAGNOSIS — Z961 Presence of intraocular lens: Secondary | ICD-10-CM | POA: Diagnosis not present

## 2022-06-30 NOTE — Telephone Encounter (Signed)
Left message on machine for patient to call back to let us know if insurance has changed and if so we would need copy to run for the Prolia.

## 2022-07-04 DIAGNOSIS — M25631 Stiffness of right wrist, not elsewhere classified: Secondary | ICD-10-CM | POA: Diagnosis not present

## 2022-07-04 DIAGNOSIS — M542 Cervicalgia: Secondary | ICD-10-CM | POA: Diagnosis not present

## 2022-07-05 ENCOUNTER — Ambulatory Visit: Payer: Medicare Other | Admitting: Family

## 2022-07-05 ENCOUNTER — Other Ambulatory Visit: Payer: Self-pay | Admitting: Family

## 2022-07-05 ENCOUNTER — Encounter: Payer: Self-pay | Admitting: Family

## 2022-07-05 MED ORDER — ALPRAZOLAM 1 MG PO TABS: 1.0000 mg | ORAL_TABLET | Freq: Two times a day (BID) | ORAL | 0 refills | Status: DC | PRN

## 2022-07-05 NOTE — Telephone Encounter (Signed)
Requesting: alprazolam 1mg   Contract:12/15/21 UDS:12/15/21 Last Visit: 06/15/22 Next Visit: None Last Refill: 06/03/22 #30 and 0RF   Please Advise

## 2022-07-07 DIAGNOSIS — M25631 Stiffness of right wrist, not elsewhere classified: Secondary | ICD-10-CM | POA: Diagnosis not present

## 2022-07-07 DIAGNOSIS — M542 Cervicalgia: Secondary | ICD-10-CM | POA: Diagnosis not present

## 2022-07-08 NOTE — Telephone Encounter (Signed)
MyChart Message has been sent to the pt.

## 2022-07-12 DIAGNOSIS — M542 Cervicalgia: Secondary | ICD-10-CM | POA: Diagnosis not present

## 2022-07-12 DIAGNOSIS — S52501D Unspecified fracture of the lower end of right radius, subsequent encounter for closed fracture with routine healing: Secondary | ICD-10-CM | POA: Diagnosis not present

## 2022-07-14 DIAGNOSIS — M542 Cervicalgia: Secondary | ICD-10-CM | POA: Diagnosis not present

## 2022-07-18 ENCOUNTER — Telehealth: Payer: Self-pay | Admitting: Family

## 2022-07-18 NOTE — Telephone Encounter (Signed)
Copied from CRM [id number]. Topic: Medicare AWV >> Jul 18, 2022  1:34 PM Payton Doughty wrote: Reason for CRM: LM 07/18/2022 to schedule AWV   Verlee Rossetti; Care Guide Ambulatory Clinical Support [facility] Direct Dial: [phone]

## 2022-07-18 NOTE — Telephone Encounter (Signed)
Pt verified that the following are her details on her insurance card (our most scanned in insurance card):   [facility] Medicare ID number [id number] Group: [id number]   RxBIN [id number] RxPCN [id number] RxGRP [id number]

## 2022-07-19 ENCOUNTER — Other Ambulatory Visit (HOSPITAL_COMMUNITY): Payer: Self-pay

## 2022-07-19 DIAGNOSIS — M542 Cervicalgia: Secondary | ICD-10-CM | POA: Diagnosis not present

## 2022-07-19 NOTE — Telephone Encounter (Signed)
Prolia VOB initiated via [email or web address]  Last Prolia inj: 07/2021  Per test claim, Co-pay $300 with [facility]

## 2022-07-20 ENCOUNTER — Ambulatory Visit (INDEPENDENT_AMBULATORY_CARE_PROVIDER_SITE_OTHER): Payer: Medicare Other | Admitting: Family

## 2022-07-20 ENCOUNTER — Other Ambulatory Visit (HOSPITAL_BASED_OUTPATIENT_CLINIC_OR_DEPARTMENT_OTHER): Payer: Self-pay

## 2022-07-20 VITALS — BP 139/58 | HR 86 | Temp 98.3°F | Resp 16 | Wt 219.0 lb

## 2022-07-20 DIAGNOSIS — I10 Essential (primary) hypertension: Secondary | ICD-10-CM

## 2022-07-20 DIAGNOSIS — J309 Allergic rhinitis, unspecified: Secondary | ICD-10-CM | POA: Diagnosis not present

## 2022-07-20 DIAGNOSIS — N3281 Overactive bladder: Secondary | ICD-10-CM

## 2022-07-20 MED ORDER — MONTELUKAST SODIUM 10 MG PO TABS: 10.0000 mg | ORAL_TABLET | Freq: Every day | ORAL | 3 refills | Status: DC
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
## 2022-07-20 NOTE — Assessment & Plan Note (Signed)
Well controlled on Myrbetriq. -Continue Myrbetriq as prescribed.

## 2022-07-20 NOTE — Assessment & Plan Note (Signed)
Previous two visits showed high blood pressure, likely due to stress. Current reading is 139/58, which is improved. No antihypertensive medications are currently being taken. -Continue to monitor blood pressure.

## 2022-07-20 NOTE — Assessment & Plan Note (Signed)
Currently on Allegra and Flonase, but still experiencing symptoms. Regular use of Mucinex. -Add Singulair to current regimen.

## 2022-07-20 NOTE — Patient Instructions (Signed)
VISIT SUMMARY:  During your visit today, we discussed your elevated blood pressure, allergies, overactive bladder, and stress levels. Your blood pressure has improved since your last visit, likely due to a decrease in stress. Your allergies are still causing some discomfort despite your current medications, and we discussed adding another medication to help manage these symptoms. Your overactive bladder is well controlled with your current medication. We will continue to monitor your stress levels.  YOUR PLAN:  -ELEVATED BLOOD PRESSURE: Your blood pressure was high during your last two visits, likely due to stress. Today, it has improved. We will continue to monitor your blood pressure to ensure it remains within a healthy range.  -ALLERGIC RHINITIS: Despite taking Allegra and Flonase, you are still experiencing allergy symptoms. We will add Singulair to your current regimen to help manage these symptoms.  -OVERACTIVE BLADDER: Your overactive bladder is well controlled with Myrbetriq. Continue taking this medication as prescribed.  -STRESS: Your stress levels have improved since your last visit. We will continue to monitor and manage your stress levels to ensure they remain manageable.  INSTRUCTIONS:  Please follow up in 3-4 months to monitor your blood pressure and allergic rhinitis. Continue taking your medications as prescribed, and try to manage your stress levels as best as you can.

## 2022-07-21 DIAGNOSIS — M542 Cervicalgia: Secondary | ICD-10-CM | POA: Diagnosis not present

## 2022-07-22 ENCOUNTER — Other Ambulatory Visit (HOSPITAL_COMMUNITY): Payer: Self-pay

## 2022-07-25 DIAGNOSIS — M542 Cervicalgia: Secondary | ICD-10-CM | POA: Diagnosis not present

## 2022-07-26 DIAGNOSIS — M542 Cervicalgia: Secondary | ICD-10-CM | POA: Diagnosis not present

## 2022-07-27 DIAGNOSIS — M19071 Primary osteoarthritis, right ankle and foot: Secondary | ICD-10-CM | POA: Diagnosis not present

## 2022-08-01 ENCOUNTER — Other Ambulatory Visit (HOSPITAL_COMMUNITY): Payer: Self-pay

## 2022-08-01 NOTE — Telephone Encounter (Signed)
Placed a call to [employer] to check benefits for Prolia.   Per the representative, patient is responsible for a $20 office visit copay, 20% coinsurance. No deductible and does require a prior authorization.   PA submitted via [employer] portal, status is pending.  Request number: [id number]  Test claim with [facility] $300 copay at the pharmacy.

## 2022-08-02 ENCOUNTER — Other Ambulatory Visit (HOSPITAL_COMMUNITY): Payer: Self-pay

## 2022-08-02 NOTE — Telephone Encounter (Signed)
Pt pharmacy insurance will cover at $300.  Medical insurance denied, see note below.  It has been over a year since pts last prolia injection, that's is what caused the denial thru pt medical ins.

## 2022-08-02 NOTE — Telephone Encounter (Signed)
Pharmacy Patient Advocate Encounter  Received notification from [employer] that the request for prior authorization for Prolia has been denied due to the policy requires history of intolerance or contraindication to oral bisphosphonates therapy and history of treatment failure to IV bisphosphonate therapy or history of treatment failure to oral and treatment failure to IV bisphosphonate therapy. If treatment has not been within the last 365 days then step therapy will apply.  .    Denial letter indexed to chart.

## 2022-08-05 ENCOUNTER — Encounter: Payer: Self-pay | Admitting: Family

## 2022-08-05 ENCOUNTER — Other Ambulatory Visit: Payer: Self-pay | Admitting: Family

## 2022-08-05 NOTE — Telephone Encounter (Signed)
Could you please contact pt about mychart message as she has not yet read. tks

## 2022-08-05 NOTE — Telephone Encounter (Signed)
Reclast order placed for [facility] on [address].

## 2022-08-05 NOTE — Telephone Encounter (Signed)
Kim, could you please place an order for to begin annual Reclast  5mg  IV infusion? I forget how we order infusions.

## 2022-08-05 NOTE — Addendum Note (Signed)
Addended by: Ashok Norris R on: 08/05/2022 10:30 AM   Modules accepted: Orders

## 2022-08-09 ENCOUNTER — Other Ambulatory Visit: Payer: Self-pay

## 2022-08-09 DIAGNOSIS — M810 Age-related osteoporosis without current pathological fracture: Secondary | ICD-10-CM

## 2022-08-09 MED ORDER — DENOSUMAB 60 MG/ML ~~LOC~~ SOSY: 60.0000 mg | PREFILLED_SYRINGE | SUBCUTANEOUS | 3 refills | Status: DC

## 2022-08-11 ENCOUNTER — Telehealth: Payer: Self-pay | Admitting: Pharmacy Technician

## 2022-08-11 NOTE — Telephone Encounter (Signed)
Clifton James note:  Auth Submission: NO AUTH NEEDED Site of care: Site of care: [facility] Payer: [employer] Medication & CPT/J Code(s) submitted: Reclast (Zolendronic acid) [id number] Route of submission (phone, fax, portal):  Phone # Fax # Auth type: Buy/Bill Units/visits requested: X1 DOSE Reference number:  Approval from: 08/11/22 to 08/11/23

## 2022-08-15 ENCOUNTER — Other Ambulatory Visit: Payer: Self-pay

## 2022-08-15 ENCOUNTER — Emergency Department (HOSPITAL_BASED_OUTPATIENT_CLINIC_OR_DEPARTMENT_OTHER): Payer: Medicare Other

## 2022-08-15 ENCOUNTER — Emergency Department (HOSPITAL_BASED_OUTPATIENT_CLINIC_OR_DEPARTMENT_OTHER)
Admission: EM | Admit: 2022-08-15 | Discharge: 2022-08-15 | Disposition: A | Discharge status: Home / Self Care | Payer: Medicare Other | Attending: Emergency Medicine | Admitting: Emergency Medicine

## 2022-08-15 DIAGNOSIS — S61412A Laceration without foreign body of left hand, initial encounter: Secondary | ICD-10-CM | POA: Insufficient documentation

## 2022-08-15 DIAGNOSIS — W268XXA Contact with other sharp object(s), not elsewhere classified, initial encounter: Secondary | ICD-10-CM | POA: Insufficient documentation

## 2022-08-15 DIAGNOSIS — S61012A Laceration without foreign body of left thumb without damage to nail, initial encounter: Secondary | ICD-10-CM | POA: Diagnosis not present

## 2022-08-15 MED ORDER — LIDOCAINE-EPINEPHRINE (PF) 2 %-1:200000 IJ SOLN
10.0000 mL | Freq: Once | INTRAMUSCULAR | Status: AC
  Administered 2022-08-15: 10 mL
  Filled 2022-08-15: qty 20

## 2022-08-15 NOTE — ED Triage Notes (Addendum)
Pt sates left hand laceration today after fall  Cut it on painting  Denies any other injury with fall  Lac gaping and approx 4 cm in length, lac located to posterior left hand Bleeding controlled   Tdap UTD per pt

## 2022-08-15 NOTE — ED Provider Notes (Signed)
[place] [facility] Provider Note   CSN: [id number] Arrival date & time: 08/15/22  1127     History  Chief Complaint  Patient presents with   Laceration    [name] [name] is a 67 y.o. female.  67 year old female presents with laceration to the dorsum of the left hand.  Patient was moving a picture frame when she sliced the back of her hand on the metal edge of the frame.  Bleeding is controlled with dressing.  No other injuries, complaints, concerns.  Last tetanus reportedly less than 5 years.       Home Medications Prior to Admission medications   Medication Sig Start Date End Date Taking? Authorizing Provider  acetaminophen (TYLENOL) 650 MG CR tablet Take 2 tablets (1,300 mg total) by mouth every 8 (eight) hours as needed for pain. Take for mild pain. Do not combine with Percocet. Do not exceed daily recommended limit of Tylenol. 01/01/16   Dorothy Spark, PA-C  ALPRAZolam (XANAX) 1 MG tablet TAKE 1 TABLET (1 MG TOTAL) BY MOUTH 2 (TWO) TIMES DAILY AS NEEDED FOR ANXIETY AT BEDTIME AS NEEDED 08/05/22   Sandford Craze, NP  Ascorbic Acid (VITAMIN C) 1000 MG tablet Take 1,000 mg by mouth at bedtime.    [provider]  busPIRone (BUSPAR) 7.5 MG tablet TAKE 1 TABLET BY MOUTH TWICE A DAY 05/27/22   Sandford Craze, NP  Calcium Carb-Cholecalciferol (CALTRATE 600+D3 PO) Take 1 tablet by mouth in the morning and at bedtime.    [provider]  Cholecalciferol (VITAMIN D3) 75 MCG (3000 UT) TABS Take 1 tablet by mouth daily. 06/16/21   Sandford Craze, NP  COVID-19 mRNA vaccine [phone] (COMIRNATY) syringe Inject into the muscle. 01/04/22   Judyann Munson, MD  denosumab (PROLIA) 60 MG/ML SOSY injection Inject 60 mg into the skin every 6 (six) months. 08/09/22   Sandford Craze, NP  diclofenac Sodium (VOLTAREN) 1 % GEL APPLY 2 G TOPICALLY 2 (TWO) TIMES DAILY AS NEEDED. 02/19/22   Sandford Craze, NP  ferrous sulfate  325 (65 FE) MG tablet Take 325 mg by mouth in the morning and at bedtime.    [provider]  fexofenadine (ALLEGRA) 180 MG tablet Take 180 mg by mouth at bedtime.    [provider]  fluconazole (DIFLUCAN) 150 MG tablet Take 1 tablet (150 mg total) by mouth every 3 (three) days. For three doses 01/19/22   Milas Hock, MD  fluticasone [facility]) 50 MCG/ACT nasal spray SPRAY 2 SPRAYS INTO EACH NOSTRIL EVERY DAY 03/03/21   Sandford Craze, NP  montelukast (SINGULAIR) 10 MG tablet Take 1 tablet (10 mg total) by mouth at bedtime. 07/20/22   Sandford Craze, NP  Multiple Vitamin (MULTIVITAMIN WITH MINERALS) TABS tablet Take 1 tablet by mouth at bedtime.    [provider]  Multiple Vitamins-Minerals (PRESERVISION AREDS 2) CAPS Take 1 tablet by mouth daily. 12/15/21   Sandford Craze, NP  MYRBETRIQ 50 MG TB24 tablet Take 50 mg by mouth at bedtime.  07/24/15   [provider]  pantoprazole (PROTONIX) 40 MG tablet TAKE 1 TABLET BY MOUTH EVERY DAY 05/01/22   Sandford Craze, NP  PARoxetine (PAXIL) 40 MG tablet TAKE 1 TABLET BY MOUTH EVERY DAY IN THE MORNING 06/26/22   Sandford Craze, NP  pilocarpine (SALAGEN) 5 MG tablet TAKE 1 TABLET BY MOUTH IN THE MORNING, AT NOON AND AT BEDTIME 03/11/22   Sandford Craze, NP  sodium chloride (OCEAN) 0.65 % SOLN  nasal spray Place 1-2 sprays into both nostrils 4 (four) times daily as needed for congestion.    [provider]  Sodium Fluoride (SODIUM FLUORIDE 5000 PPM) 1.1 % PSTE Use as directed. 02/01/22   Sandford Craze, NP  Tdap Leda Min) 5-2.5-18.5 LF-MCG/0.5 injection Inject into the muscle. 01/04/22   Judyann Munson, MD  valACYclovir (VALTREX) 500 MG tablet Take 1 tablet (500 mg total) by mouth daily. 02/09/21   Olive Bass, FNP  Vitamin D, Ergocalciferol, (DRISDOL) 1.25 MG (50000 UNIT) CAPS capsule Take 1 capsule (50,000 Units total) by mouth every 7 (seven) days. 03/10/22   Sandford Craze, NP  zinc gluconate 50 MG tablet Take 50 mg by mouth at bedtime.     [provider]      Allergies    Nitrofurantoin, Other, and Ibuprofen    Review of Systems   Review of Systems Negative except as per HPI Physical Exam Updated Vital Signs BP (!) 162/91 (BP Location: Right Arm)   Pulse 75   Temp 98.2 F (36.8 C) (Oral)   Resp 16   Ht 5\' 3"  (1.6 m)   Wt 99.3 kg   SpO2 98%   BMI 38.78 kg/m  Physical Exam Vitals and nursing note reviewed.  Constitutional:      General: She is not in acute distress.    Appearance: She is well-developed. She is not diaphoretic.  HENT:     Head: Normocephalic and atraumatic.  Pulmonary:     Effort: Pulmonary effort is normal.  Musculoskeletal:        General: Swelling, tenderness and signs of injury present. No deformity.     Left wrist: Normal.     Left hand: Laceration and tenderness present. No swelling, deformity or bony tenderness. Normal range of motion. Normal capillary refill. Normal pulse.     Comments: Large laceration to dorsum of the left hand along the 2nd metacarpal   Skin:    General: Skin is warm and dry.     Findings: No erythema or rash.  Neurological:     Mental Status: She is alert and oriented to person, place, and time.     Sensory: No sensory deficit.     Motor: No weakness.  Psychiatric:        Behavior: Behavior normal.     ED Results / Procedures / Treatments   Labs (all labs ordered are listed, but only abnormal results are displayed) Labs Reviewed - No data to display  EKG None  Radiology DG Hand Complete Left  Result Date: 08/15/2022 CLINICAL DATA:  Laceration EXAM: LEFT HAND - COMPLETE 3+ VIEW COMPARISON:  None Available. FINDINGS: There is no evidence of acute fracture or dislocation. Bony alignment is normal. There is moderate narrowing of the thumb and index finger CMC joints with associated subchondral sclerosis. The other joint spaces are overall preserved. There is no erosive  change. There is a probable remote fracture of the ulnar styloid. Postsurgical changes are partially imaged in the wrist without evidence of complication to the level imaged. The soft tissues are unremarkable. There is no soft tissue gas or radiopaque foreign body. IMPRESSION: 1. No acute osseous abnormality in the hand. 2. No soft tissue gas or radiopaque foreign body. Electronically Signed   By: Lesia Hausen M.D.   On: 08/15/2022 12:18    Procedures .Marland KitchenLaceration Repair  Date/Time: 08/15/2022 1:50 PM  Performed by: Jeannie Fend, PA-C Authorized by: Jeannie Fend, PA-C   Consent:  Consent obtained:  Verbal   Consent given by:  Patient   Risks discussed:  Infection, pain, poor cosmetic result, poor wound healing, nerve damage, need for additional repair, tendon damage and retained foreign body Universal protocol:    Patient identity confirmed:  Verbally with patient Anesthesia:    Anesthesia method:  Local infiltration   Local anesthetic:  Lidocaine 2% WITH epi Laceration details:    Location:  Hand   Hand location:  L hand, dorsum   Length (cm):  10   Depth (mm):  3 Pre-procedure details:    Preparation:  Patient was prepped and draped in usual sterile fashion and imaging obtained to evaluate for foreign bodies Exploration:    Limited defect created (wound extended): no     Hemostasis achieved with:  Epinephrine   Imaging obtained: x-ray     Imaging outcome: foreign body not noted     Wound exploration: wound explored through full range of motion and entire depth of wound visualized     Wound extent: no signs of injury, no nerve damage, no tendon damage and no underlying fracture     Contaminated: no   Treatment:    Area cleansed with:  Saline   Amount of cleaning:  Standard   Irrigation solution:  Sterile saline Skin repair:    Repair method:  Sutures   Suture size:  4-0   Suture material:  Prolene   Suture technique:  Simple interrupted   Number of sutures:   7 Approximation:    Approximation:  Close Repair type:    Repair type:  Simple Post-procedure details:    Dressing:  Bulky dressing   Procedure completion:  Tolerated well, no immediate complications     Medications Ordered in ED Medications  lidocaine-EPINEPHrine (XYLOCAINE W/EPI) 2 %-1:200000 (PF) injection 10 mL (has no administration in time range)    ED Course/ Medical Decision Making/ A&P                             Medical Decision Making Amount and/or Complexity of Data Reviewed Radiology: ordered.  Risk Prescription drug management.   67 year old female presents with laceration to nondominant dorsum of left hand as above.  Tetanus is up-to-date.  X-ray is negative for fracture or retained foreign body.  Wound was anesthetized, fully irrigated, viewed through full range of motion without obvious injury to tendons.  Strength appears to be intact with normal capillary refill.  Wound was closed with simple interrupted sutures.  Recommend wound check with PCP in 2 days, suture removal in 10 to 12 days.        Final Clinical Impression(s) / ED Diagnoses Final diagnoses:  Laceration of left hand without foreign body, initial encounter    Rx / DC Orders ED Discharge Orders     None         Jeannie Fend, PA-C 08/15/22 1355    Arby Barrette, MD 08/17/22 [phone]

## 2022-08-15 NOTE — ED Notes (Signed)
Wound care done with mepatale and dsd  pt given instructions for dressing change which she states she understands

## 2022-08-15 NOTE — Telephone Encounter (Signed)
Prolia was sent to:   [facility] - [place], [place] - One [facility] AT Portal to Registered [address] One [place], [place] [address] Phone: [phone]  Fax: [phone]   Called tp cancel the Rx. Rep stated that all should be good since they did not see an account set up for her yet. (It would have been a new account).

## 2022-08-15 NOTE — Telephone Encounter (Signed)
Pt will be receiving Reclast infusions per MO.  Prolia RX was sent to:   [facility] - [place], [place] - One [facility] AT Portal to Registered [address] One [place], [place] [address] Phone: [phone]  Fax: [phone]   Called tp cancel the Rx. Rep stated that all should be good since they did not see an account set up for her yet. (It would have been a new account).

## 2022-08-21 ENCOUNTER — Encounter: Payer: Self-pay | Admitting: Family

## 2022-08-21 ENCOUNTER — Other Ambulatory Visit: Payer: Self-pay | Admitting: Family

## 2022-08-25 ENCOUNTER — Ambulatory Visit (INDEPENDENT_AMBULATORY_CARE_PROVIDER_SITE_OTHER): Payer: Medicare Other

## 2022-08-25 VITALS — BP 151/82 | HR 64 | Temp 97.6°F | Resp 18 | Ht 64.0 in | Wt 221.8 lb

## 2022-08-25 DIAGNOSIS — M810 Age-related osteoporosis without current pathological fracture: Secondary | ICD-10-CM | POA: Diagnosis not present

## 2022-08-25 MED ORDER — SODIUM CHLORIDE 0.9 % IV SOLN: INTRAVENOUS | Status: DC

## 2022-08-25 MED ORDER — ZOLEDRONIC ACID 5 MG/100ML IV SOLN
5.0000 mg | Freq: Once | INTRAVENOUS | Status: AC
  Administered 2022-08-25: 5 mg via INTRAVENOUS
  Filled 2022-08-25: qty 100

## 2022-08-25 MED ORDER — DIPHENHYDRAMINE HCL 25 MG PO CAPS
25.0000 mg | ORAL_CAPSULE | Freq: Once | ORAL | Status: AC
  Administered 2022-08-25: 25 mg via ORAL
  Filled 2022-08-25: qty 1

## 2022-08-25 MED ORDER — ACETAMINOPHEN 325 MG PO TABS
650.0000 mg | ORAL_TABLET | Freq: Once | ORAL | Status: AC
  Administered 2022-08-25: 650 mg via ORAL
  Filled 2022-08-25: qty 2

## 2022-08-25 NOTE — Progress Notes (Signed)
Diagnosis: Osteoporosis  Provider:  Chilton Greathouse MD  Procedure: IV Infusion  IV Type: Peripheral, IV Location: L Antecubital  Reclast (Zolendronic Acid), Dose: 5 mg  Infusion Start Time: 1443  Infusion Stop Time: 1515  Post Infusion IV Care: Observation period completed and Peripheral IV Discontinued  Discharge: Condition: Good, Destination: Home . AVS Provided  Performed by:  Garnette Czech, RN

## 2022-08-26 ENCOUNTER — Ambulatory Visit (INDEPENDENT_AMBULATORY_CARE_PROVIDER_SITE_OTHER): Payer: Medicare Other | Admitting: Family

## 2022-08-26 ENCOUNTER — Encounter: Payer: Self-pay | Admitting: Family

## 2022-08-26 ENCOUNTER — Other Ambulatory Visit (HOSPITAL_BASED_OUTPATIENT_CLINIC_OR_DEPARTMENT_OTHER): Payer: Self-pay

## 2022-08-26 VITALS — BP 151/65 | HR 80 | Temp 98.4°F | Ht 64.0 in | Wt 220.0 lb

## 2022-08-26 DIAGNOSIS — Z87828 Personal history of other (healed) physical injury and trauma: Secondary | ICD-10-CM

## 2022-08-26 MED ORDER — DOXYCYCLINE HYCLATE 100 MG PO TABS
100.0000 mg | ORAL_TABLET | Freq: Two times a day (BID) | ORAL | 0 refills | Status: DC
  Filled 2022-08-26: qty 14, 7d supply, fill #0

## 2022-08-26 NOTE — Progress Notes (Unsigned)
Subjective:     Patient ID: [name] [name], female    DOB: May 26, 1955, 67 y.o.   MRN: [id number]  Chief Complaint  Patient presents with  . Hospital follow up    ED 08/15/22 left hand laceration     HPI  Discussed the use of AI scribe software for clinical note transcription with the patient, who gave verbal consent to proceed.  History of Present Illness              Health Maintenance Due  Topic Date Due  . Medicare Annual Wellness (AWV)  Never done  . COVID-19 Vaccine (4 - 2023-24 season) 03/01/2022    Past Medical History:  Diagnosis Date  . Allergy   . Anemia    takes iron supplement  . Anxiety   . Arthritis    feet, shoulders,knees,   . 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 Elevated alkaline phosphatase level    negative workup (presumed secondary to fatty liver)  . Frequency-urgency syndrome   . GERD (gastroesophageal reflux disease)    none since weight loss surgery  . Interstitial cystitis    Dr Normajean Baxter  . Lymphadenopathy of head and neck 11/09/2018  . Neuromuscular disorder (HCC)    rt arm carpal tunnel syndrome  . Obesity    status post bariatric procedure in [facility] 2005  . Osteoporosis   . Osteoporosis 08/21/2008  . Sarcoidosis of lung (HCC)   . Thyroid nodule 08/2008   `  . Thyroid nodule   . Varicose vein of leg     Past Surgical History:  Procedure Laterality Date  . ANTERIOR CERVICAL DECOMP/DISCECTOMY FUSION N/A 06/21/2012   Procedure: ANTERIOR CERVICAL DECOMPRESSION/DISCECTOMY FUSION C-4 - C5 (SPACER/DePUY CERVICAL PLATE ONLY) 1 LEVEL;  Surgeon: Venita Lick, MD;  Location: [facility] OR;  Service: Orthopedics;  Laterality: N/A;  . BLADDER SURGERY  02/21/12 and 02/28/12   Dr Retta Diones  . COLPOSCOPY  1980  . CRYOTHERAPY  1980  . CYSTO WITH HYDRODISTENSION  12/12/2011   Procedure: CYSTOSCOPY/HYDRODISTENSION;  Surgeon: Marcine Matar, MD;  [facility];  Service: Urology;  Laterality: N/A;  30 MIN   . EYE SURGERY   1997   bilateral cataract extraction  . FOOT SURGERY     bilateral bunionettes  . FOOT SURGERY Right 01/07/14   Pt states surgery was due to arthritis. "Has pins in place now"  . FOREIGN BODY REMOVAL Right 09/27/2019   Procedure: REMOVAL FOREIGN BOD RIGHT FOOT;  Surgeon: Beverely Low, MD;  Location: [facility];  Service: Orthopedics;  Laterality: Right;  . FRACTURE SURGERY Left 2019   wrist  . GASTRIC BYPASS  2005  . JOINT REPLACEMENT  2008   left total knee  . KNEE SURGERY  1997   left knee  . LYMPH NODE DISSECTION Right 11/09/2018   Procedure: EXCISE DEEP RIGHT CERVICAL LYMPH NODE;  Surgeon: Claud Kelp, MD;  Location: [facility] OR;  Service: General;  Laterality: Right;  . NASAL SINUS SURGERY  1989   Dr Lazarus Salines  . REFRACTIVE SURGERY  2006   lasik  . REVERSE SHOULDER ARTHROPLASTY Left 09/27/2019   Procedure: REVERSE SHOULDER ARTHROPLASTY;  Surgeon: Beverely Low, MD;  Location: [facility];  Service: Orthopedics;  Laterality: Left;  2 hrs General with intrascalene block  . SHOULDER SURGERY  2 2012   RIGHT   . STOMACH SURGERY  2007   "tummy tuck"  . TOTAL KNEE ARTHROPLASTY Right 12/31/2015   Procedure: RIGHT TOTAL KNEE ARTHROPLASTY;  Surgeon: Jene Every, MD;  Location: [facility];  Service: Orthopedics;  Laterality: Right;  Adductor Block  . VARICOSE VEIN SURGERY Right 02/2012   leg    Family History  Problem Relation Age of Onset  . Diabetes Paternal Grandfather   . Stroke Paternal Grandfather   . Hyperlipidemia Mother   . Hypertension Mother   . Hypertension Father   . Diabetes Father   . Prostate cancer Father   . Dementia Father   . Lung cancer Father   . Heart disease Maternal Grandmother   . Hypertension Maternal Grandmother   . Heart disease Maternal Grandfather   . Hypertension Maternal Grandfather   . Stroke Maternal Grandfather   . Arthritis Brother   . Testicular cancer Brother   . Colon cancer Neg Hx   . Esophageal cancer Neg Hx   . Pancreatic cancer Neg Hx   .  Stomach cancer Neg Hx     Social History   Socioeconomic History  . Marital status: Married    Spouse name: Not on file  . Number of children: 1  . Years of education: Not on file  . Highest education level: Not on file  Occupational History  . Occupation: Event organiser: [employer]  Tobacco Use  . Smoking status: Never  . Smokeless tobacco: Never  Vaping Use  . Vaping status: Never Used  Substance and Sexual Activity  . Alcohol use: Not Currently    Comment: RARE  . Drug use: No  . Sexual activity: Yes    Comment: 1st intercourse 67 yo-More than 5 partners  Other Topics Concern  . Not on file  Social History Narrative   Married    daughter (Bipolar/schizophrenia)   granddaughter   Retired at age 48   Social Determinants of Health   Financial Resource Strain: Not on file  Food Insecurity: No Food Insecurity (06/30/2020)   [facility]   Hunger Vital Sign   . Worried About Programme researcher, broadcasting/film/video in the Last Year: Never true   . Ran Out of Food in the Last Year: Never true  Transportation Needs: Not on file  Physical Activity: Not on file  Stress: Not on file  Social Connections: Unknown (06/17/2021)   Received from [facility]   Social Network   . Social Network: Not on file  Intimate Partner Violence: Unknown (05/21/2021)   [facility] from [facility]   HITS   . Physically Hurt: Not on file   . Insult or Talk Down To: Not on file   . Threaten Physical Harm: Not on file   . Scream or Curse: Not on file    Outpatient Medications Prior to Visit  Medication Sig Dispense Refill  . acetaminophen (TYLENOL) 650 MG CR tablet Take 2 tablets (1,300 mg total) by mouth every 8 (eight) hours as needed for pain. Take for mild pain. Do not combine with Percocet. Do not exceed daily recommended limit of Tylenol.    . ALPRAZolam (XANAX) 1 MG tablet TAKE 1 TABLET (1 MG TOTAL) BY MOUTH 2 (TWO) TIMES DAILY AS NEEDED FOR ANXIETY AT BEDTIME AS NEEDED 30  tablet 0  . Ascorbic Acid (VITAMIN C) 1000 MG tablet Take 1,000 mg by mouth at bedtime.    . busPIRone (BUSPAR) 7.5 MG tablet TAKE 1 TABLET BY MOUTH TWICE A DAY 180 tablet 1  . Calcium Carb-Cholecalciferol (CALTRATE 600+D3 PO) Take 1 tablet by mouth in the morning and at bedtime.    Marland Kitchen  Cholecalciferol (VITAMIN D3) 75 MCG (3000 UT) TABS Take 1 tablet by mouth daily. 30 tablet   . COVID-19 mRNA vaccine 2023-2024 (COMIRNATY) syringe Inject into the muscle. 0.3 mL 0  . denosumab (PROLIA) 60 MG/ML SOSY injection Inject 60 mg into the skin every 6 (six) months. 1 mL 3  . diclofenac Sodium (VOLTAREN) 1 % GEL APPLY 2 G TOPICALLY 2 (TWO) TIMES DAILY AS NEEDED. 100 g 3  . ferrous sulfate 325 (65 FE) MG tablet Take 325 mg by mouth in the morning and at bedtime.    . fexofenadine (ALLEGRA) 180 MG tablet Take 180 mg by mouth at bedtime.    . fluconazole (DIFLUCAN) 150 MG tablet Take 1 tablet (150 mg total) by mouth every 3 (three) days. For three doses 3 tablet 3  . fluticasone (FLONASE) 50 MCG/ACT nasal spray SPRAY 2 SPRAYS INTO EACH NOSTRIL EVERY DAY 16 mL 5  . montelukast (SINGULAIR) 10 MG tablet Take 1 tablet (10 mg total) by mouth at bedtime. 30 tablet 3  . Multiple Vitamin (MULTIVITAMIN WITH MINERALS) TABS tablet Take 1 tablet by mouth at bedtime.    . Multiple Vitamins-Minerals (PRESERVISION AREDS 2) CAPS Take 1 tablet by mouth daily.    Marland Kitchen MYRBETRIQ 50 MG TB24 tablet Take 50 mg by mouth at bedtime.   2  . pantoprazole (PROTONIX) 40 MG tablet TAKE 1 TABLET BY MOUTH EVERY DAY 90 tablet 1  . PARoxetine (PAXIL) 40 MG tablet TAKE 1 TABLET BY MOUTH EVERY DAY IN THE MORNING 90 tablet 2  . pilocarpine (SALAGEN) 5 MG tablet TAKE ONE TABLET BY MOUTH THREE TIMES A DAY ( IN THE MORNING , AT NOON AND AT BEDTIME ) 60 tablet 5  . sodium chloride (OCEAN) 0.65 % SOLN nasal spray Place 1-2 sprays into both nostrils 4 (four) times daily as needed for congestion.    . Sodium Fluoride (SODIUM FLUORIDE 5000 PPM) 1.1 % PSTE  Use as directed. 100 mL 2  . Tdap (BOOSTRIX) 5-2.5-18.5 LF-MCG/0.5 injection Inject into the muscle. 0.5 mL 0  . valACYclovir (VALTREX) 500 MG tablet Take 1 tablet (500 mg total) by mouth daily. 90 tablet 1  . Vitamin D, Ergocalciferol, (DRISDOL) 1.25 MG (50000 UNIT) CAPS capsule Take 1 capsule (50,000 Units total) by mouth every 7 (seven) days. 12 capsule 1  . zinc gluconate 50 MG tablet Take 50 mg by mouth at bedtime.      No facility-administered medications prior to visit.    Allergies  Allergen Reactions  . Nitrofurantoin Other (See Comments)    REACTION: fever  . Other Other (See Comments)  . Ibuprofen Nausea Only    REACTION: Post gastric bypass-->can not take due to surgery.  No allergy. REACTION: Post gastric bypass-->can not take due to surgery.  No allergy.    ROS     Objective:    Physical Exam   BP (!) 151/65 (BP Location: Right Arm, Patient Position: Sitting, Cuff Size: Large)   Pulse 80   Temp 98.4 F (36.9 C) (Oral)   Ht 5\' 4"  (1.626 m)   Wt 220 lb (99.8 kg)   SpO2 100%   BMI 37.76 kg/m  Wt Readings from Last 3 Encounters:  08/26/22 220 lb (99.8 kg)  08/25/22 221 lb 12.8 oz (100.6 kg)  08/15/22 218 lb 14.7 oz (99.3 kg)       Assessment & Plan:   Problem List Items Addressed This Visit   None   I am having [name] [name]  maintain her Myrbetriq, acetaminophen, zinc gluconate, multivitamin with minerals, vitamin C, ferrous sulfate, fexofenadine, Calcium Carb-Cholecalciferol (CALTRATE 600+D3 PO), sodium chloride, valACYclovir, fluticasone, Vitamin D3, PreserVision AREDS 2, Comirnaty, Boostrix, fluconazole, Sodium Fluoride 5000 PPM, diclofenac Sodium, Vitamin D (Ergocalciferol), pantoprazole, busPIRone, PARoxetine, montelukast, ALPRAZolam, denosumab, and pilocarpine.  No orders of the defined types were placed in this encounter.

## 2022-08-27 DIAGNOSIS — Z87828 Personal history of other (healed) physical injury and trauma: Secondary | ICD-10-CM | POA: Insufficient documentation

## 2022-08-27 HISTORY — DX: Personal history of other (healed) physical injury and trauma: Z87.828

## 2022-08-27 NOTE — Assessment & Plan Note (Signed)
Wound appears to be infected with surrounding cellulitis.  I am concerned that if I remove the sutures today the wound may dehisc. Will initiate doxycycline over the weekend with plans for return on Monday for re-evaluation and likely suture removal.

## 2022-08-29 ENCOUNTER — Telehealth: Payer: Self-pay

## 2022-08-29 NOTE — Telephone Encounter (Signed)
Called patient to follow up on laceration on her hand. She was supposed to follow up today for possible stitches removal.  She reports "her sister in law removed the stitches yesterday and incision is looking better"

## 2022-09-05 ENCOUNTER — Encounter: Payer: Self-pay | Admitting: Family

## 2022-09-05 MED ORDER — ALPRAZOLAM 1 MG PO TABS: ORAL_TABLET | ORAL | 0 refills | Status: DC

## 2022-09-06 ENCOUNTER — Encounter: Payer: Self-pay | Admitting: Family

## 2022-09-07 MED ORDER — BUSPIRONE HCL 7.5 MG PO TABS: 7.5000 mg | ORAL_TABLET | Freq: Two times a day (BID) | ORAL | 2 refills | Status: DC

## 2022-09-15 DIAGNOSIS — M25511 Pain in right shoulder: Secondary | ICD-10-CM | POA: Diagnosis not present

## 2022-09-15 DIAGNOSIS — G8929 Other chronic pain: Secondary | ICD-10-CM | POA: Diagnosis not present

## 2022-09-19 ENCOUNTER — Encounter: Payer: Self-pay | Admitting: Family

## 2022-09-19 NOTE — Telephone Encounter (Signed)
Scheduled for nv tomorrow for BP check

## 2022-09-19 NOTE — Telephone Encounter (Signed)
OK to schedule nurse visit.

## 2022-09-20 ENCOUNTER — Other Ambulatory Visit: Payer: Self-pay | Admitting: Family

## 2022-09-20 ENCOUNTER — Other Ambulatory Visit (HOSPITAL_BASED_OUTPATIENT_CLINIC_OR_DEPARTMENT_OTHER): Payer: Self-pay

## 2022-09-20 ENCOUNTER — Ambulatory Visit: Payer: Medicare Other

## 2022-09-20 VITALS — BP 125/58 | HR 77

## 2022-09-20 DIAGNOSIS — I10 Essential (primary) hypertension: Secondary | ICD-10-CM

## 2022-09-20 MED ORDER — SODIUM FLUORIDE 5000 PPM 1.1 % DT PSTE
PASTE | DENTAL | 2 refills | Status: DC
  Filled 2022-09-20: qty 100, 30d supply, fill #0

## 2022-09-20 NOTE — Progress Notes (Signed)
BP Readings from Last 3 Encounters:  08/26/22 (!) 151/65  08/25/22 (!) 151/82  08/15/22 (!) 162/91   Patient reports getting elevated BP readings at home up to 159/95   Bp reading today is 125/ 58 with a pulse of 77.  Per provider Sandford Craze NP, ok to keep monitoring at home and bring her machine to next ov.

## 2022-09-26 ENCOUNTER — Encounter: Payer: Self-pay | Admitting: Family

## 2022-09-26 ENCOUNTER — Other Ambulatory Visit: Payer: Self-pay

## 2022-09-26 ENCOUNTER — Other Ambulatory Visit: Payer: Self-pay | Admitting: Obstetrics and Gynecology

## 2022-09-26 DIAGNOSIS — N760 Acute vaginitis: Secondary | ICD-10-CM

## 2022-09-26 MED ORDER — FLUTICASONE PROPIONATE 50 MCG/ACT NA SUSP: 2.0000 | Freq: Every day | NASAL | 5 refills | Status: DC

## 2022-09-29 ENCOUNTER — Encounter (INDEPENDENT_AMBULATORY_CARE_PROVIDER_SITE_OTHER): Payer: Self-pay

## 2022-09-30 ENCOUNTER — Encounter: Payer: Self-pay | Admitting: Family

## 2022-09-30 ENCOUNTER — Other Ambulatory Visit (HOSPITAL_BASED_OUTPATIENT_CLINIC_OR_DEPARTMENT_OTHER): Payer: Self-pay

## 2022-09-30 ENCOUNTER — Ambulatory Visit (INDEPENDENT_AMBULATORY_CARE_PROVIDER_SITE_OTHER): Payer: Medicare Other | Admitting: Family

## 2022-09-30 VITALS — BP 132/76 | HR 62 | Resp 18 | Ht 64.0 in | Wt 223.0 lb

## 2022-09-30 DIAGNOSIS — R300 Dysuria: Secondary | ICD-10-CM | POA: Diagnosis not present

## 2022-09-30 LAB — POCT URINALYSIS DIPSTICK
Bilirubin, UA: NEGATIVE
Blood, UA: NEGATIVE
Glucose, UA: NEGATIVE
Ketones, UA: NEGATIVE
Nitrite, UA: POSITIVE
Protein, UA: NEGATIVE
Spec Grav, UA: 1.025 (ref 1.010–1.025)
Urobilinogen, UA: 0.2 E.U./dL
pH, UA: 6 (ref 5.0–8.0)

## 2022-09-30 MED ORDER — SULFAMETHOXAZOLE-TRIMETHOPRIM 800-160 MG PO TABS
1.0000 | ORAL_TABLET | Freq: Two times a day (BID) | ORAL | 0 refills | Status: DC
  Filled 2022-09-30: qty 10, 5d supply, fill #0

## 2022-09-30 NOTE — Progress Notes (Signed)
[name] [name] is a 67 y.o. 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) 12/17/2020   Maxillary sinusitis 07/24/2020   H/O total shoulder replacement, 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   Sarcoidosis 12/11/2018   Lymphadenopathy of head and neck 11/09/2018   Primary osteoarthritis of right knee 12/31/2015   Spinal stenosis of lumbar region 12/31/2015   S/P TKR (total knee replacement), right 12/31/2015   Diverticulitis of intestine without perforation or abscess without bleeding 09/18/2013   Preventative health care 06/28/2013   Recurrent sinusitis 02/05/2013   OAB (overactive bladder) 01/04/2013   Onychomycosis 09/19/2012   Right bundle branch block 03/07/2012   Anemia 03/03/2011   Rosacea 10/30/2010   THYROID NODULE, RIGHT 09/10/2008   THYROID STIMULATING HORMONE, ABNORMAL 09/08/2008   Vitamin D deficiency 09/08/2008   ALKALINE PHOSPHATASE, ELEVATED 09/08/2008   Osteoporosis 08/21/2008   BARIATRIC SURGERY STATUS 08/21/2008   MORBID OBESITY 02/27/2007   Anxiety 02/27/2007   Essential hypertension 02/27/2007   HEMORRHOIDS 02/27/2007   VENOUS INSUFFICIENCY 02/27/2007   Allergic rhinitis 02/27/2007   GERD 02/27/2007   DEGENERATIVE JOINT DISEASE 02/27/2007    Current Outpatient Medications  Medication Sig Dispense Refill   acetaminophen (TYLENOL) 650 MG CR tablet Take 2 tablets (1,300 mg total) by mouth every 8 (eight) hours as needed for pain. Take for mild pain. Do not combine with Percocet. Do not exceed daily recommended limit of Tylenol.     ALPRAZolam (XANAX) 1 MG tablet TAKE 1 TABLET (1 MG TOTAL) BY MOUTH 2 (TWO) TIMES DAILY AS NEEDED FOR ANXIETY AT BEDTIME AS NEEDED 30 tablet  0   Ascorbic Acid (VITAMIN C) 1000 MG tablet Take 1,000 mg by mouth at bedtime.     busPIRone (BUSPAR) 7.5 MG tablet Take 1 tablet (7.5 mg total) by mouth 2 (two) times daily. 60 tablet 2   Calcium Carb-Cholecalciferol (CALTRATE 600+D3 PO) Take 1 tablet by mouth in the morning and at bedtime.     Cholecalciferol (VITAMIN D3) 75 MCG (3000 UT) TABS Take 1 tablet by mouth daily. 30 tablet    COVID-19 mRNA vaccine 2023-2024 (COMIRNATY) syringe Inject into the muscle. 0.3 mL 0   denosumab (PROLIA) 60 MG/ML SOSY injection Inject 60 mg into the skin every 6 (six) months. 1 mL 3   diclofenac Sodium (VOLTAREN) 1 % GEL APPLY 2 G TOPICALLY 2 (TWO) TIMES DAILY AS NEEDED. 100 g 3   ferrous sulfate 325 (65 FE) MG tablet Take 325 mg by mouth in the morning and at bedtime.     fexofenadine (ALLEGRA) 180 MG tablet Take 180 mg by mouth at bedtime.     fluconazole (DIFLUCAN) 150 MG tablet Take 1 tablet (150 mg total) by mouth every 3 (three) days. For three doses 3 tablet 3   fluticasone (FLONASE) 50 MCG/ACT nasal spray Place 2 sprays into both nostrils daily. 16 mL 5   montelukast (SINGULAIR) 10 MG tablet Take 1 tablet (10 mg total) by mouth at bedtime. 30 tablet 3   Multiple Vitamin (MULTIVITAMIN WITH MINERALS) TABS tablet Take 1 tablet by mouth at bedtime.     Multiple Vitamins-Minerals (PRESERVISION AREDS 2) CAPS Take 1 tablet by mouth daily.     MYRBETRIQ 50  MG TB24 tablet Take 50 mg by mouth at bedtime.   2   pantoprazole (PROTONIX) 40 MG tablet TAKE 1 TABLET BY MOUTH EVERY DAY 90 tablet 1   PARoxetine (PAXIL) 40 MG tablet TAKE 1 TABLET BY MOUTH EVERY DAY IN THE MORNING 90 tablet 2   pilocarpine (SALAGEN) 5 MG tablet TAKE ONE TABLET BY MOUTH THREE TIMES A DAY ( IN THE MORNING , AT NOON AND AT BEDTIME ) 60 tablet 5   sodium chloride (OCEAN) 0.65 % SOLN nasal spray Place 1-2 sprays into both nostrils 4 (four) times daily as needed for congestion.     Sodium Fluoride (SODIUM FLUORIDE 5000 PPM) 1.1 % PSTE Use as  directed. 100 mL 2   sulfamethoxazole-trimethoprim (BACTRIM DS) 800-160 MG tablet Take 1 tablet by mouth 2 (two) times daily. 10 tablet 0   Tdap (BOOSTRIX) 5-2.5-18.5 LF-MCG/0.5 injection Inject into the muscle. 0.5 mL 0   valACYclovir (VALTREX) 500 MG tablet Take 1 tablet (500 mg total) by mouth daily. 90 tablet 1   Vitamin D, Ergocalciferol, (DRISDOL) 1.25 MG (50000 UNIT) CAPS capsule Take 1 capsule (50,000 Units total) by mouth every 7 (seven) days. 12 capsule 1   zinc gluconate 50 MG tablet Take 50 mg by mouth at bedtime.      No current facility-administered medications for this visit.    Allergies: Nitrofurantoin, Other, and Ibuprofen  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   Elevated alkaline phosphatase level    negative workup (presumed secondary to fatty liver)   Frequency-urgency syndrome    GERD (gastroesophageal reflux disease)    none since weight loss surgery   Interstitial cystitis    Dr Normajean Baxter   Lymphadenopathy of head and neck 11/09/2018   Neuromuscular disorder (HCC)    rt arm carpal tunnel syndrome   Obesity    status post bariatric procedure in [place] 2005   Osteoporosis    Osteoporosis 08/21/2008   Sarcoidosis of lung (HCC)    Thyroid nodule 08/2008   `   Thyroid nodule    Varicose vein of leg     Past Surgical History:  Procedure Laterality Date   ANTERIOR CERVICAL DECOMP/DISCECTOMY FUSION N/A 06/21/2012   Procedure: ANTERIOR CERVICAL DECOMPRESSION/DISCECTOMY FUSION C-4 - C5 (SPACER/DePUY CERVICAL PLATE ONLY) 1 LEVEL;  Surgeon: Venita Lick, MD;  Location: [facility] OR;  Service: Orthopedics;  Laterality: N/A;   BLADDER SURGERY  02/21/12 and 02/28/12   Dr Retta Diones   COLPOSCOPY  1980   CRYOTHERAPY  1980   CYSTO WITH HYDRODISTENSION  12/12/2011   Procedure: CYSTOSCOPY/HYDRODISTENSION;  Surgeon: Marcine Matar, MD;  Location:  [facility];  Service: Urology;  Laterality: N/A;  30 MIN    EYE SURGERY  1997   bilateral cataract extraction   FOOT SURGERY     bilateral bunionettes   FOOT SURGERY Right 01/07/14   Pt states surgery was due to arthritis. "Has pins in place now"   FOREIGN BODY REMOVAL Right 09/27/2019   Procedure: REMOVAL FOREIGN BOD RIGHT FOOT;  Surgeon: Beverely Low, MD;  Location: [facility];  Service: Orthopedics;  Laterality: Right;   FRACTURE SURGERY Left 2019   wrist   GASTRIC BYPASS  2005   JOINT REPLACEMENT  2008   left total knee   KNEE SURGERY  1997  left knee   LYMPH NODE DISSECTION Right 11/09/2018   Procedure: EXCISE DEEP RIGHT CERVICAL LYMPH NODE;  Surgeon: Claud Kelp, MD;  Location: [facility] OR;  Service: General;  Laterality: Right;   NASAL SINUS SURGERY  1989   Dr [facility]   REFRACTIVE SURGERY  2006   lasik   REVERSE SHOULDER ARTHROPLASTY Left 09/27/2019   Procedure: REVERSE SHOULDER ARTHROPLASTY;  Surgeon: Beverely Low, MD;  Location: [facility];  Service: Orthopedics;  Laterality: Left;  2 hrs General with intrascalene block   SHOULDER SURGERY  2 2012   RIGHT    STOMACH SURGERY  2007   "tummy tuck"   TOTAL KNEE ARTHROPLASTY Right 12/31/2015   Procedure: RIGHT TOTAL KNEE ARTHROPLASTY;  Surgeon: Jene Every, MD;  Location: [facility];  Service: Orthopedics;  Laterality: Right;  Adductor Block   VARICOSE VEIN SURGERY Right 02/2012   leg    Family History  Problem Relation Age of Onset   Diabetes Paternal Grandfather    Stroke Paternal Grandfather    Hyperlipidemia Mother    Hypertension Mother    Hypertension Father    Diabetes Father    Prostate cancer Father    Dementia Father    Lung cancer Father    Heart disease Maternal Grandmother    Hypertension Maternal Grandmother    Heart disease Maternal Grandfather    Hypertension Maternal Grandfather    Stroke Maternal Grandfather    Arthritis Brother    Testicular cancer Brother    Colon cancer Neg Hx     Esophageal cancer Neg Hx    Pancreatic cancer Neg Hx    Stomach cancer Neg Hx     Social History   Tobacco Use   Smoking status: Never   Smokeless tobacco: Never  Substance Use Topics   Alcohol use: Not Currently    Comment: RARE    Subjective:  Concerns for UTI; +burning, urgency; symptoms x 1 week; is prone to recurrent UTIs; no fever or blood in urine; has been taking OTC Azo with some relief;     Objective:  Vitals:   09/30/22 1520  BP: 132/76  Pulse: 62  Resp: 18  SpO2: 98%  Weight: 223 lb (101.2 kg)  Height: 5\' 4"  (1.626 m)    General: Well developed, well nourished, in no acute distress  Skin : Warm and dry.  Head: Normocephalic and atraumatic  Lungs: Respirations unlabored; clear to auscultation bilaterally without wheeze, rales, rhonchi  CVS exam: normal rate and regular rhythm.  Neurologic: Alert and oriented; speech intact; face symmetrical; moves all extremities well; CNII-XII intact without focal deficit   Assessment:  1. Dysuria     Plan:  Suspect UTI; check U/A and urine culture; Rx for Bactrim DS bid x 5 days; follow up worse, no better.   No follow-ups on file.  Orders Placed This Encounter  Procedures   Urine Culture   POCT Urinalysis Dipstick    Requested Prescriptions   Signed Prescriptions Disp Refills   sulfamethoxazole-trimethoprim (BACTRIM DS) 800-160 MG tablet 10 tablet 0    Sig: Take 1 tablet by mouth 2 (two) times daily.

## 2022-10-02 LAB — URINE CULTURE
MICRO NUMBER:: 15341992
SPECIMEN QUALITY:: ADEQUATE

## 2022-10-03 ENCOUNTER — Encounter: Payer: Self-pay | Admitting: Family

## 2022-10-03 ENCOUNTER — Other Ambulatory Visit: Payer: Self-pay | Admitting: *Deleted

## 2022-10-03 DIAGNOSIS — R399 Unspecified symptoms and signs involving the genitourinary system: Secondary | ICD-10-CM

## 2022-10-03 MED ORDER — CEPHALEXIN 500 MG PO CAPS
500.0000 mg | ORAL_CAPSULE | Freq: Two times a day (BID) | ORAL | 0 refills | Status: AC
Start: 2022-10-03 — End: 2022-10-08

## 2022-10-03 NOTE — Telephone Encounter (Signed)
New Rx sent for Keflex, pt notified. Advised her to call if symptoms do not improve with Keflex.

## 2022-10-05 ENCOUNTER — Encounter: Payer: Self-pay | Admitting: Family

## 2022-10-06 DIAGNOSIS — H40023 Open angle with borderline findings, high risk, bilateral: Secondary | ICD-10-CM | POA: Diagnosis not present

## 2022-10-06 DIAGNOSIS — Z9889 Other specified postprocedural states: Secondary | ICD-10-CM | POA: Diagnosis not present

## 2022-10-06 DIAGNOSIS — H47011 Ischemic optic neuropathy, right eye: Secondary | ICD-10-CM | POA: Diagnosis not present

## 2022-10-06 DIAGNOSIS — H353131 Nonexudative age-related macular degeneration, bilateral, early dry stage: Secondary | ICD-10-CM | POA: Diagnosis not present

## 2022-10-06 DIAGNOSIS — H16223 Keratoconjunctivitis sicca, not specified as Sjogren's, bilateral: Secondary | ICD-10-CM | POA: Diagnosis not present

## 2022-10-06 DIAGNOSIS — Z83518 Family history of other specified eye disorder: Secondary | ICD-10-CM | POA: Diagnosis not present

## 2022-10-06 DIAGNOSIS — H35363 Drusen (degenerative) of macula, bilateral: Secondary | ICD-10-CM | POA: Diagnosis not present

## 2022-10-06 DIAGNOSIS — E559 Vitamin D deficiency, unspecified: Secondary | ICD-10-CM | POA: Diagnosis not present

## 2022-10-06 DIAGNOSIS — H5213 Myopia, bilateral: Secondary | ICD-10-CM | POA: Diagnosis not present

## 2022-10-06 DIAGNOSIS — D8689 Sarcoidosis of other sites: Secondary | ICD-10-CM | POA: Diagnosis not present

## 2022-10-06 DIAGNOSIS — Z9884 Bariatric surgery status: Secondary | ICD-10-CM | POA: Diagnosis not present

## 2022-10-06 MED ORDER — ALPRAZOLAM 1 MG PO TABS: ORAL_TABLET | ORAL | 0 refills | Status: DC

## 2022-10-06 NOTE — Telephone Encounter (Signed)
Requesting: alprazolam 1mg   Contract: 12/14/20 UDS: 12/15/21 Last Visit: 08/26/22 Next Visit: 10/21/22 Last Refill: 09/05/22 #30 and 0RF   Please Advise

## 2022-10-06 NOTE — Telephone Encounter (Signed)
Contract on file from 8/23.

## 2022-10-16 ENCOUNTER — Other Ambulatory Visit: Payer: Self-pay | Admitting: Family

## 2022-10-16 DIAGNOSIS — J309 Allergic rhinitis, unspecified: Secondary | ICD-10-CM

## 2022-10-21 ENCOUNTER — Encounter: Payer: Self-pay | Admitting: Family

## 2022-10-21 ENCOUNTER — Telehealth: Payer: Self-pay | Admitting: Family

## 2022-10-21 ENCOUNTER — Ambulatory Visit (INDEPENDENT_AMBULATORY_CARE_PROVIDER_SITE_OTHER): Payer: Medicare Other | Admitting: Family

## 2022-10-21 VITALS — BP 129/82 | HR 76 | Temp 98.3°F | Resp 20 | Ht 64.0 in | Wt 218.4 lb

## 2022-10-21 DIAGNOSIS — R87610 Atypical squamous cells of undetermined significance on cytologic smear of cervix (ASC-US): Secondary | ICD-10-CM | POA: Diagnosis not present

## 2022-10-21 DIAGNOSIS — G4700 Insomnia, unspecified: Secondary | ICD-10-CM | POA: Diagnosis not present

## 2022-10-21 DIAGNOSIS — I10 Essential (primary) hypertension: Secondary | ICD-10-CM | POA: Diagnosis not present

## 2022-10-21 DIAGNOSIS — K219 Gastro-esophageal reflux disease without esophagitis: Secondary | ICD-10-CM | POA: Diagnosis not present

## 2022-10-21 DIAGNOSIS — Z23 Encounter for immunization: Secondary | ICD-10-CM | POA: Diagnosis not present

## 2022-10-21 DIAGNOSIS — D869 Sarcoidosis, unspecified: Secondary | ICD-10-CM

## 2022-10-21 DIAGNOSIS — R87810 Cervical high risk human papillomavirus (HPV) DNA test positive: Secondary | ICD-10-CM

## 2022-10-21 DIAGNOSIS — F419 Anxiety disorder, unspecified: Secondary | ICD-10-CM

## 2022-10-21 DIAGNOSIS — N3281 Overactive bladder: Secondary | ICD-10-CM

## 2022-10-21 DIAGNOSIS — J309 Allergic rhinitis, unspecified: Secondary | ICD-10-CM | POA: Diagnosis not present

## 2022-10-21 MED ORDER — BUSPIRONE HCL 15 MG PO TABS: 15.0000 mg | ORAL_TABLET | Freq: Two times a day (BID) | ORAL | 5 refills | Status: DC

## 2022-10-21 MED ORDER — VALACYCLOVIR HCL 500 MG PO TABS: 500.0000 mg | ORAL_TABLET | Freq: Every day | ORAL | 0 refills | Status: DC | PRN

## 2022-10-21 MED ORDER — SODIUM FLUORIDE 5000 PPM 1.1 % DT PSTE: PASTE | DENTAL | 2 refills | Status: DC

## 2022-10-21 NOTE — Assessment & Plan Note (Signed)
  Experiencing increased symptoms, currently using Flonase and Mucinex for management. -Continue Flonase and Mucinex as needed. -Consider adding antihistamine if symptoms persist. -Contact provider if symptoms worsen or do not improve in 3-4 days as she is very prone to sinusitis.

## 2022-10-21 NOTE — Assessment & Plan Note (Signed)
  Difficulty falling asleep due to neck pain and anxiety. Occasionally uses Xanax for anxiety-related insomnia. -Continue current management.

## 2022-10-21 NOTE — Patient Instructions (Signed)
During your visit, we discussed your increased anxiety, sinus congestion, and bladder issues. We also talked about your ongoing conditions including sarcoidosis, herpes, hypertension, allergic rhinitis, gastroesophageal reflux disease, and insomnia. We made some adjustments to your medications and discussed the importance of staying up-to-date with your vaccines.  YOUR PLAN:  -ANXIETY: Your anxiety has increased due to family stressors. We will increase your Buspirone to 15mg  twice daily to help manage this.  -HERPES SIMPLEX VIRUS: You are not currently experiencing any outbreaks. Continue taking Valtrex 500mg  daily as needed.  -HYPERTENSION: Your blood pressure was slightly elevated during the visit. We will recheck this at your next visit.  -ALLERGIC RHINITIS: Your allergy symptoms have increased. Continue using Flonase and Mucinex as needed. If symptoms persist, consider adding an antihistamine. Contact your provider if symptoms worsen or do not improve in 3-4 days.  -OVERACTIVE BLADDER: Your bladder issues are well managed with Myrbetriq. Continue this medication as directed by your urologist.  -SARCOIDOSIS: Your sarcoidosis is stable and managed by your rheumatologist. Continue with your current management plan.  -GASTROESOPHAGEAL REFLUX DISEASE: Your reflux disease is well controlled with Pantoprazole. Continue taking this medication as directed.  -INSOMNIA: You have difficulty falling asleep due to neck pain and anxiety. Continue with your current management plan.  -GENERAL HEALTH MAINTENANCE: You received the influenza vaccine today. Consider getting the COVID-19 booster and RSV vaccine at your pharmacy. Your next appointment is in 6 months.  INSTRUCTIONS:  Remember to take your medications as directed and keep up with your vaccines. If your symptoms worsen or do not improve in the next few days, please contact your provider. We will see you again in 6 months, or sooner if needed.

## 2022-10-21 NOTE — Telephone Encounter (Signed)
Contract left upfront for patient's signature

## 2022-10-21 NOTE — Addendum Note (Signed)
Addended by: Wilford Corner on: 10/21/2022 01:27 PM   Modules accepted: Orders

## 2022-10-21 NOTE — Assessment & Plan Note (Signed)
Follows annually with Dr. Dierdre Forth.

## 2022-10-21 NOTE — Progress Notes (Signed)
Subjective:     Patient ID: [name] [name], female    DOB: [birth year], 67 y.o.   MRN: [id number]  Chief Complaint  Patient presents with   Hypertension    Here for follow up   Allergies    Patient complains of seasonal allergies   Anxiety    Patient reports increased anxiety symptoms    Hypertension Associated symptoms include anxiety.  Anxiety      Discussed the use of AI scribe software for clinical note transcription with the patient, who gave verbal consent to proceed.  History of Present Illness   The patient, with a history of sarcoidosis, anxiety, overactive bladder, and herpes, presents with increased anxiety, sinus congestion, and bladder issues. She reports that her anxiety has worsened due to family stressors, but has seen some improvement with buspar. States she made an appointment with psychiatry for counseling but they adjusted her medication adding buspar.  She is currently on 10mg  but plan was to follow up with them to further titrate up dose. She is requesting that we adjust here as she does not feel need to return. No longer feeling like she needs to do counseling since she started the buspar. She also reports sinus congestion and sneezing, which she attributes to allergies. She has been managing these symptoms with Mucinex, allegra, singulair and Flonase. The patient also mentions issues with her bladder, which she manages with Myrbetriq. She reports that missing doses of this medication significantly worsens her symptoms. The patient also requests a refill of her Valtrex prescription, which she takes as needed for herpes outbreaks.       Health Maintenance Due  Topic Date Due   Medicare Annual Wellness (AWV)  Never done   COVID-19 Vaccine (4 - 2023-24 season) 10/16/2022    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   Elevated  alkaline phosphatase level    negative workup (presumed secondary to fatty liver)   Frequency-urgency syndrome    GERD (gastroesophageal reflux disease)    none since weight loss surgery   H/O open hand wound 08/27/2022   H/O total shoulder replacement, 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   Interstitial cystitis    Dr Normajean Baxter   Lymphadenopathy of head and neck 11/09/2018   Maxillary sinusitis 07/24/2020   Neuromuscular disorder (HCC)    rt arm carpal tunnel syndrome   Obesity    status post bariatric procedure in [place] 2005   Osteoporosis    Osteoporosis 08/21/2008   Sarcoidosis of lung (HCC)    Thyroid nodule 08/2008   `   Thyroid nodule    Varicose vein of leg     Past Surgical History:  Procedure Laterality Date   ANTERIOR CERVICAL DECOMP/DISCECTOMY FUSION N/A 06/21/2012   Procedure: ANTERIOR CERVICAL DECOMPRESSION/DISCECTOMY FUSION C-4 - C5 (SPACER/DePUY CERVICAL PLATE ONLY) 1 LEVEL;  Surgeon: Venita Lick, MD;  Location: [facility] OR;  Service: Orthopedics;  Laterality: N/A;   BLADDER SURGERY  02/21/12 and 02/28/12   Dr Retta Diones   COLPOSCOPY  1980   CRYOTHERAPY  1980   CYSTO WITH HYDRODISTENSION  12/12/2011   Procedure: CYSTOSCOPY/HYDRODISTENSION;  Surgeon: Marcine Matar, MD;  Location: [facility];  Service: Urology;  Laterality: N/A;  30 MIN    EYE SURGERY  1997   bilateral cataract extraction   FOOT SURGERY  bilateral bunionettes   FOOT SURGERY Right 01/07/14   Pt states surgery was due to arthritis. "Has pins in place now"   FOREIGN BODY REMOVAL Right 09/27/2019   Procedure: REMOVAL FOREIGN BOD RIGHT FOOT;  Surgeon: Beverely Low, MD;  Location: [facility];  Service: Orthopedics;  Laterality: Right;   FRACTURE SURGERY Left 2019   wrist   GASTRIC BYPASS  2005   JOINT REPLACEMENT  2008   left total knee   KNEE SURGERY  1997   left knee   LYMPH NODE DISSECTION Right 11/09/2018   Procedure: EXCISE DEEP RIGHT CERVICAL LYMPH NODE;  Surgeon: Claud Kelp, MD;   Location: [facility] OR;  Service: General;  Laterality: Right;   NASAL SINUS SURGERY  1989   Dr [facility]   REFRACTIVE SURGERY  2006   lasik   REVERSE SHOULDER ARTHROPLASTY Left 09/27/2019   Procedure: REVERSE SHOULDER ARTHROPLASTY;  Surgeon: Beverely Low, MD;  Location: [facility];  Service: Orthopedics;  Laterality: Left;  2 hrs General with intrascalene block   SHOULDER SURGERY  2 2012   RIGHT    STOMACH SURGERY  2007   "tummy tuck"   TOTAL KNEE ARTHROPLASTY Right 12/31/2015   Procedure: RIGHT TOTAL KNEE ARTHROPLASTY;  Surgeon: Jene Every, MD;  Location: [facility];  Service: Orthopedics;  Laterality: Right;  Adductor Block   VARICOSE VEIN SURGERY Right 02/2012   leg    Family History  Problem Relation Age of Onset   Diabetes Paternal Grandfather    Stroke Paternal Grandfather    Hyperlipidemia Mother    Hypertension Mother    Hypertension Father    Diabetes Father    Prostate cancer Father    Dementia Father    Lung cancer Father    Heart disease Maternal Grandmother    Hypertension Maternal Grandmother    Heart disease Maternal Grandfather    Hypertension Maternal Grandfather    Stroke Maternal Grandfather    Arthritis Brother    Testicular cancer Brother    Colon cancer Neg Hx    Esophageal cancer Neg Hx    Pancreatic cancer Neg Hx    Stomach cancer Neg Hx     Social History   Socioeconomic History   Marital status: Married    Spouse name: Not on file   Number of children: 1   Years of education: Not on file   Highest education level: Not on file  Occupational History   Occupation: Event organiser: [employer]  Tobacco Use   Smoking status: Never   Smokeless tobacco: Never  Vaping Use   Vaping status: Never Used  Substance and Sexual Activity   Alcohol use: Not Currently    Comment: RARE   Drug use: No   Sexual activity: Yes    Comment: 1st intercourse 67 yo-More than 5 partners  Other Topics Concern   Not on file  Social History Narrative    Married    daughter (Bipolar/schizophrenia)   granddaughter   Retired at age 37   Social Determinants of Corporate investment banker Strain: Not on file  Food Insecurity: No Food Insecurity (06/30/2020)   Received from [facility]   Hunger Vital Sign    Worried About Running Out of Food in the Last Year: Never true    Ran Out of Food in the Last Year: Never true  Transportation Needs: Not on file  Physical Activity: Not on file  Stress: Not on file  Social Connections: Unknown (06/17/2021)  Received [facility]   Social Network    Social Network: Not on file  Intimate Partner Violence: Unknown (05/21/2021)   Received from [facility]   HITS    Physically Hurt: Not on file    Insult or Talk Down To: Not on file    Threaten Physical Harm: Not on file    Scream or Curse: Not on file    Outpatient Medications Prior to Visit  Medication Sig Dispense Refill   acetaminophen (TYLENOL) 650 MG CR tablet Take 2 tablets (1,300 mg total) by mouth every 8 (eight) hours as needed for pain. Take for mild pain. Do not combine with Percocet. Do not exceed daily recommended limit of Tylenol.     ALPRAZolam (XANAX) 1 MG tablet TAKE 1 TABLET (1 MG TOTAL) BY MOUTH 2 (TWO) TIMES DAILY AS NEEDED FOR ANXIETY AT BEDTIME AS NEEDED 30 tablet 0   Ascorbic Acid (VITAMIN C) 1000 MG tablet Take 1,000 mg by mouth at bedtime.     Calcium Carb-Cholecalciferol (CALTRATE 600+D3 PO) Take 1 tablet by mouth in the morning and at bedtime.     Cholecalciferol (VITAMIN D3) 75 MCG (3000 UT) TABS Take 1 tablet by mouth daily. 30 tablet    COVID-19 mRNA vaccine 2023-2024 (COMIRNATY) syringe Inject into the muscle. 0.3 mL 0   denosumab (PROLIA) 60 MG/ML SOSY injection Inject 60 mg into the skin every 6 (six) months. 1 mL 3   diclofenac Sodium (VOLTAREN) 1 % GEL APPLY 2 G TOPICALLY 2 (TWO) TIMES DAILY AS NEEDED. 100 g 3   ferrous sulfate 325 (65 FE) MG tablet Take 325 mg by mouth in the morning and at bedtime.      fexofenadine (ALLEGRA) 180 MG tablet Take 180 mg by mouth at bedtime.     fluconazole (DIFLUCAN) 150 MG tablet Take 1 tablet (150 mg total) by mouth every 3 (three) days. For three doses 3 tablet 3   fluticasone (FLONASE) 50 MCG/ACT nasal spray Place 2 sprays into both nostrils daily. 16 mL 5   montelukast (SINGULAIR) 10 MG tablet TAKE 1 TABLET BY MOUTH EVERYDAY AT BEDTIME 90 tablet 1   Multiple Vitamin (MULTIVITAMIN WITH MINERALS) TABS tablet Take 1 tablet by mouth at bedtime.     Multiple Vitamins-Minerals (PRESERVISION AREDS 2) CAPS Take 1 tablet by mouth daily.     MYRBETRIQ 50 MG TB24 tablet Take 50 mg by mouth at bedtime.   2   pantoprazole (PROTONIX) 40 MG tablet TAKE 1 TABLET BY MOUTH EVERY DAY 90 tablet 1   PARoxetine (PAXIL) 40 MG tablet TAKE 1 TABLET BY MOUTH EVERY DAY IN THE MORNING 90 tablet 2   pilocarpine (SALAGEN) 5 MG tablet TAKE ONE TABLET BY MOUTH THREE TIMES A DAY ( IN THE MORNING , AT NOON AND AT BEDTIME ) 60 tablet 5   sodium chloride (OCEAN) 0.65 % SOLN nasal spray Place 1-2 sprays into both nostrils 4 (four) times daily as needed for congestion.     sulfamethoxazole-trimethoprim (BACTRIM DS) 800-160 MG tablet Take 1 tablet by mouth 2 (two) times daily. 10 tablet 0   Vitamin D, Ergocalciferol, (DRISDOL) 1.25 MG (50000 UNIT) CAPS capsule Take 1 capsule (50,000 Units total) by mouth every 7 (seven) days. 12 capsule 1   zinc gluconate 50 MG tablet Take 50 mg by mouth at bedtime.      busPIRone (BUSPAR) 7.5 MG tablet Take 1 tablet (7.5 mg total) by mouth 2 (two) times daily. 60 tablet 2  Sodium Fluoride (SODIUM FLUORIDE 5000 PPM) 1.1 % PSTE Use as directed. 100 mL 2   Tdap (BOOSTRIX) 5-2.5-18.5 LF-MCG/0.5 injection Inject into the muscle. 0.5 mL 0   valACYclovir (VALTREX) 500 MG tablet Take 1 tablet (500 mg total) by mouth daily. 90 tablet 1   No facility-administered medications prior to visit.    Allergies  Allergen Reactions   Nitrofurantoin Other (See Comments)     REACTION: fever   Other Other (See Comments)   Ibuprofen Nausea Only    REACTION: Post gastric bypass-->can not take due to surgery.  No allergy. REACTION: Post gastric bypass-->can not take due to surgery.  No allergy.    ROS See HPI    Objective:    Physical Exam Constitutional:      General: She is not in acute distress.    Appearance: Normal appearance. She is well-developed.  HENT:     Head: Normocephalic and atraumatic.     Right Ear: External ear normal.     Left Ear: External ear normal.  Eyes:     General: No scleral icterus. Neck:     Thyroid: No thyromegaly.  Cardiovascular:     Rate and Rhythm: Normal rate and regular rhythm.     Heart sounds: Normal heart sounds. No murmur heard. Pulmonary:     Effort: Pulmonary effort is normal. No respiratory distress.     Breath sounds: Normal breath sounds. No wheezing.  Musculoskeletal:     Cervical back: Neck supple.  Skin:    General: Skin is warm and dry.  Neurological:     Mental Status: She is alert and oriented to person, place, and time.  Psychiatric:        Mood and Affect: Mood normal.        Behavior: Behavior normal.        Thought Content: Thought content normal.        Judgment: Judgment normal.      BP 129/82   Pulse 76   Temp 98.3 F (36.8 C) (Oral)   Resp 20   Ht 5\' 4"  (1.626 m)   Wt 218 lb 6.4 oz (99.1 kg)   SpO2 97%   BMI 37.49 kg/m  Wt Readings from Last 3 Encounters:  10/21/22 218 lb 6.4 oz (99.1 kg)  09/30/22 223 lb (101.2 kg)  08/26/22 220 lb (99.8 kg)       Assessment & Plan:   Problem List Items Addressed This Visit       Unprioritized   Sarcoidosis    Follows annually with Dr. Dierdre Forth.       OAB (overactive bladder) - Primary     Well managed with Myrbetriq or alternative medication (pt unsure name- gets samples provided by urologist. -Continue current management as directed by urologist.      Insomnia     Difficulty falling asleep due to neck pain and anxiety.  Occasionally uses Xanax for anxiety-related insomnia. -Continue current management.      GERD     Well controlled with Pantoprazole, occasional symptoms with increased sinus drainage. -Continue Pantoprazole as directed.      Essential hypertension    Initial bp was elevated. Repeat BP WNL.       Atypical squamous cell changes of undetermined significance (ASCUS) on cervical cytology with positive 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)    She is following with [facility] for this.       Anxiety     Increased anxiety managed with  Buspirone and paxil. No reported side effects. Noted improvement with addition of buspar. -Increase Buspirone to 15mg  daily.      Relevant Medications   busPIRone (BUSPAR) 15 MG tablet   Allergic rhinitis     Experiencing increased symptoms, currently using Flonase and Mucinex for management. -Continue Flonase and Mucinex as needed. -Consider adding antihistamine if symptoms persist. -Contact provider if symptoms worsen or do not improve in 3-4 days as she is very prone to sinusitis.       I have discontinued Dealva T. Bircher's Boostrix and busPIRone. I have also changed her valACYclovir. Additionally, I am having her start on busPIRone. Lastly, I am having her maintain her Myrbetriq, acetaminophen, zinc gluconate, multivitamin with minerals, vitamin C, ferrous sulfate, fexofenadine, Calcium Carb-Cholecalciferol (CALTRATE 600+D3 PO), sodium chloride, Vitamin D3, PreserVision AREDS 2, Comirnaty, fluconazole, diclofenac Sodium, Vitamin D (Ergocalciferol), pantoprazole, PARoxetine, denosumab, pilocarpine, fluticasone, sulfamethoxazole-trimethoprim, ALPRAZolam, montelukast, and Sodium Fluoride 5000 PPM.  Meds ordered this encounter  Medications   valACYclovir (VALTREX) 500 MG tablet    Sig: Take 1 tablet (500 mg total) by mouth daily as needed.    Dispense:  30 tablet    Refill:  0    Order Specific Question:   Supervising Provider    Answer:    Danise Edge A [[id number]]   Sodium Fluoride (SODIUM FLUORIDE 5000 PPM) 1.1 % PSTE    Sig: Use as directed.    Dispense:  100 mL    Refill:  2    Order Specific Question:   Supervising Provider    Answer:   Danise Edge A [[id number]]   busPIRone (BUSPAR) 15 MG tablet    Sig: Take 1 tablet (15 mg total) by mouth 2 (two) times daily.    Dispense:  60 tablet    Refill:  5    Order Specific Question:   Supervising Provider    Answer:   Danise Edge A [[id number]]

## 2022-10-21 NOTE — Assessment & Plan Note (Signed)
She is following with [facility] for this.

## 2022-10-21 NOTE — Assessment & Plan Note (Addendum)
  Well managed with Myrbetriq or alternative medication (pt unsure name- gets samples provided by urologist. -Continue current management as directed by urologist.

## 2022-10-21 NOTE — Assessment & Plan Note (Signed)
  Well controlled with Pantoprazole, occasional symptoms with increased sinus drainage. -Continue Pantoprazole as directed.

## 2022-10-21 NOTE — Assessment & Plan Note (Signed)
  Increased anxiety managed with Buspirone and paxil. No reported side effects. Noted improvement with addition of buspar. -Increase Buspirone to 15mg  daily.

## 2022-10-21 NOTE — Telephone Encounter (Signed)
Please leave a controlled substance contract at the front desk for her to sign. See mychart.

## 2022-10-21 NOTE — Assessment & Plan Note (Signed)
Initial bp was elevated. Repeat BP WNL.

## 2022-10-25 NOTE — Telephone Encounter (Signed)
Pt signed Contract and given to CMA. Done

## 2022-10-26 MED ORDER — AMOXICILLIN-POT CLAVULANATE 875-125 MG PO TABS: 1.0000 | ORAL_TABLET | Freq: Two times a day (BID) | ORAL | 0 refills | Status: AC

## 2022-10-26 NOTE — Addendum Note (Signed)
Addended by: Sandford Craze on: 10/26/2022 11:04 AM   Modules accepted: Orders

## 2022-10-31 DIAGNOSIS — N3281 Overactive bladder: Secondary | ICD-10-CM | POA: Diagnosis not present

## 2022-11-05 ENCOUNTER — Encounter: Payer: Self-pay | Admitting: Family

## 2022-11-06 MED ORDER — ALPRAZOLAM 1 MG PO TABS: ORAL_TABLET | ORAL | 0 refills | Status: DC

## 2022-11-10 ENCOUNTER — Encounter: Payer: Self-pay | Admitting: Family

## 2022-11-14 ENCOUNTER — Other Ambulatory Visit: Payer: Self-pay | Admitting: Family

## 2022-11-15 DIAGNOSIS — M542 Cervicalgia: Secondary | ICD-10-CM | POA: Diagnosis not present

## 2022-11-16 DIAGNOSIS — G8929 Other chronic pain: Secondary | ICD-10-CM | POA: Diagnosis not present

## 2022-11-16 DIAGNOSIS — M25511 Pain in right shoulder: Secondary | ICD-10-CM | POA: Diagnosis not present

## 2022-11-16 DIAGNOSIS — G5681 Other specified mononeuropathies of right upper limb: Secondary | ICD-10-CM | POA: Diagnosis not present

## 2022-11-18 DIAGNOSIS — M25562 Pain in left knee: Secondary | ICD-10-CM | POA: Diagnosis not present
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
## 2022-11-22 ENCOUNTER — Encounter: Payer: Self-pay | Admitting: Physical Therapy

## 2022-11-22 ENCOUNTER — Ambulatory Visit: Payer: Medicare Other | Attending: Specialist | Admitting: Physical Therapy

## 2022-11-22 ENCOUNTER — Other Ambulatory Visit: Payer: Self-pay

## 2022-11-22 DIAGNOSIS — R262 Difficulty in walking, not elsewhere classified: Secondary | ICD-10-CM | POA: Diagnosis not present

## 2022-11-22 DIAGNOSIS — M25562 Pain in left knee: Secondary | ICD-10-CM | POA: Diagnosis not present

## 2022-11-22 DIAGNOSIS — M6281 Muscle weakness (generalized): Secondary | ICD-10-CM | POA: Diagnosis not present

## 2022-11-23 ENCOUNTER — Encounter: Payer: Self-pay | Admitting: Family

## 2022-11-23 ENCOUNTER — Encounter (INDEPENDENT_AMBULATORY_CARE_PROVIDER_SITE_OTHER): Payer: Medicare Other | Admitting: Family

## 2022-11-23 DIAGNOSIS — N760 Acute vaginitis: Secondary | ICD-10-CM

## 2022-11-23 MED ORDER — BUSPIRONE HCL 15 MG PO TABS: 15.0000 mg | ORAL_TABLET | Freq: Two times a day (BID) | ORAL | 1 refills | Status: DC

## 2022-11-23 MED ORDER — FLUCONAZOLE 150 MG PO TABS: 150.0000 mg | ORAL_TABLET | Freq: Once | ORAL | 0 refills | Status: AC

## 2022-11-23 MED ORDER — PANTOPRAZOLE SODIUM 40 MG PO TBEC: 40.0000 mg | DELAYED_RELEASE_TABLET | Freq: Every day | ORAL | 1 refills | Status: DC

## 2022-11-23 NOTE — Telephone Encounter (Signed)
Please see the MyChart message reply(ies) for my assessment and plan.  The patient gave consent for this Medical Advice Message and is aware that it may result in a bill to their insurance company as well as the possibility that this may result in a co-payment or deductible. They are an established patient, but are not seeking medical advice exclusively about a problem treated during an in person or video visit in the last 7 days. I did not recommend an in person or video visit within 7 days of my reply.  I spent a total of 5 minutes cumulative provider time within 7 days through [employer].  Nance Pear, NP

## 2022-11-26 ENCOUNTER — Other Ambulatory Visit: Payer: Self-pay | Admitting: Family

## 2022-11-28 ENCOUNTER — Ambulatory Visit: Payer: Medicare Other | Admitting: Physical Therapy

## 2022-11-28 ENCOUNTER — Encounter: Payer: Self-pay | Admitting: Physical Therapy

## 2022-11-28 DIAGNOSIS — M25562 Pain in left knee: Secondary | ICD-10-CM

## 2022-11-28 DIAGNOSIS — R262 Difficulty in walking, not elsewhere classified: Secondary | ICD-10-CM | POA: Diagnosis not present

## 2022-11-28 DIAGNOSIS — M6281 Muscle weakness (generalized): Secondary | ICD-10-CM
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
## 2022-11-30 ENCOUNTER — Ambulatory Visit: Payer: Medicare Other

## 2022-12-01 ENCOUNTER — Ambulatory Visit: Payer: Medicare Other

## 2022-12-01 DIAGNOSIS — M542 Cervicalgia: Secondary | ICD-10-CM | POA: Diagnosis not present

## 2022-12-03 ENCOUNTER — Other Ambulatory Visit: Payer: Self-pay | Admitting: Family

## 2022-12-04 MED ORDER — ALPRAZOLAM 1 MG PO TABS: ORAL_TABLET | ORAL | 0 refills | Status: DC

## 2022-12-04 NOTE — Addendum Note (Signed)
Addended by: Sandford Craze on: 12/04/2022 02:12 PM   Modules accepted: Orders

## 2022-12-05 ENCOUNTER — Ambulatory Visit: Payer: Medicare Other

## 2022-12-06 ENCOUNTER — Ambulatory Visit (INDEPENDENT_AMBULATORY_CARE_PROVIDER_SITE_OTHER): Payer: Medicare Other | Admitting: Family

## 2022-12-06 ENCOUNTER — Ambulatory Visit (HOSPITAL_BASED_OUTPATIENT_CLINIC_OR_DEPARTMENT_OTHER)
Admission: RE | Admit: 2022-12-06 | Discharge: 2022-12-06 | Disposition: A | Discharge status: Home / Self Care | Payer: Medicare Other | Source: Ambulatory Visit | Attending: Family | Admitting: Family

## 2022-12-06 ENCOUNTER — Other Ambulatory Visit (HOSPITAL_BASED_OUTPATIENT_CLINIC_OR_DEPARTMENT_OTHER): Payer: Self-pay

## 2022-12-06 ENCOUNTER — Encounter: Payer: Self-pay | Admitting: Family

## 2022-12-06 ENCOUNTER — Other Ambulatory Visit (HOSPITAL_BASED_OUTPATIENT_CLINIC_OR_DEPARTMENT_OTHER): Payer: Self-pay | Admitting: Family

## 2022-12-06 VITALS — BP 155/85 | HR 84 | Temp 97.7°F | Resp 16 | Wt 220.0 lb

## 2022-12-06 DIAGNOSIS — Z96612 Presence of left artificial shoulder joint: Secondary | ICD-10-CM | POA: Diagnosis not present

## 2022-12-06 DIAGNOSIS — J069 Acute upper respiratory infection, unspecified: Secondary | ICD-10-CM

## 2022-12-06 DIAGNOSIS — R918 Other nonspecific abnormal finding of lung field: Secondary | ICD-10-CM | POA: Insufficient documentation

## 2022-12-06 DIAGNOSIS — Z0389 Encounter for observation for other suspected diseases and conditions ruled out: Secondary | ICD-10-CM | POA: Diagnosis not present

## 2022-12-06 DIAGNOSIS — Z139 Encounter for screening, unspecified: Secondary | ICD-10-CM

## 2022-12-06 MED ORDER — ALBUTEROL SULFATE HFA 108 (90 BASE) MCG/ACT IN AERS
2.0000 | INHALATION_SPRAY | Freq: Four times a day (QID) | RESPIRATORY_TRACT | 0 refills | Status: DC | PRN
  Filled 2022-12-06: qty 6.7, 25d supply, fill #0

## 2022-12-06 MED ORDER — BENZONATATE 100 MG PO CAPS
100.0000 mg | ORAL_CAPSULE | Freq: Two times a day (BID) | ORAL | 0 refills | Status: DC | PRN
  Filled 2022-12-06: qty 20, 10d supply, fill #0

## 2022-12-06 NOTE — Assessment & Plan Note (Signed)
  Symptoms of headache, sneezing, runny nose, sore throat, and cough for 9 days. No fever. No improvement with Mucinex. Auscultation revealed crackles in the left lung.  -Order chest x-ray to rule out pneumonia.  -Continue Mucinex.  -If no improvement in 2-3 days, patient to notify the office.  -Prescribe Tessalon for cough, particularly at night.  -Prescribe Albuterol inhaler for wheezing.  -Call if symptoms worsen or if not improved in 3 days.

## 2022-12-06 NOTE — Progress Notes (Signed)
Subjective:     Patient ID: [name] [name], female    DOB: [birth year], 67 y.o.   MRN: [id number]  Chief Complaint  Patient presents with   Cough    Complains of persistent cough   Nasal Congestion    Complains of congestion   Fatigue    Complains of fatigue    HPI  Discussed the use of AI scribe software for clinical note transcription with the patient, who gave verbal consent to proceed.  History of Present Illness   The patient, with a history of allergies and sinusitis, presents with a 10-day history of upper respiratory symptoms. She initially woke up with a headache, sneezing, and a runny nose, which she initially attributed to allergies. However, the symptoms progressed rapidly, and at one point, she could hardly speak. She also reports ear pain and ringing, and occasional headaches. Her throat is not too sore, but she has been trying to clear mucus from her throat. She denies fever throughout the illness. She has been taking Mucinex for symptom relief. She reports feeling worse yesterday, but a slight improvement today. She denies any recent COVID-19 testing.      BP Readings from Last 3 Encounters:  12/06/22 (!) 155/85  10/21/22 129/82  09/30/22 132/76      Health Maintenance Due  Topic Date Due   Medicare Annual Wellness (AWV)  Never done   COVID-19 Vaccine (4 - 2023-24 season) 10/16/2022    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   Elevated alkaline phosphatase level    negative workup (presumed secondary to fatty liver)   Frequency-urgency syndrome    GERD (gastroesophageal reflux disease)    none since weight loss surgery   H/O open hand wound 08/27/2022   H/O total shoulder replacement, 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   Interstitial cystitis    Dr Normajean Baxter   Lymphadenopathy of head and neck 11/09/2018   Maxillary sinusitis 07/24/2020    Neuromuscular disorder (HCC)    rt arm carpal tunnel syndrome   Obesity    status post bariatric procedure in [place] 2005   Osteoporosis    Osteoporosis 08/21/2008   Sarcoidosis of lung (HCC)    Thyroid nodule 08/2008   `   Thyroid nodule    Varicose vein of leg     Past Surgical History:  Procedure Laterality Date   ANTERIOR CERVICAL DECOMP/DISCECTOMY FUSION N/A 06/21/2012   Procedure: ANTERIOR CERVICAL DECOMPRESSION/DISCECTOMY FUSION C-4 - C5 (SPACER/DePUY CERVICAL PLATE ONLY) 1 LEVEL;  Surgeon: Venita Lick, MD;  Location: [facility];  Service: Orthopedics;  Laterality: N/A;   BLADDER SURGERY  02/21/12 and 02/28/12   Dr Retta Diones   COLPOSCOPY  1980   CRYOTHERAPY  1980   CYSTO WITH HYDRODISTENSION  12/12/2011   Procedure: CYSTOSCOPY/HYDRODISTENSION;  Surgeon: Marcine Matar, MD;  Location: [facility];  Service: Urology;  Laterality: N/A;  30 MIN    EYE SURGERY  1997   bilateral cataract extraction   FOOT SURGERY     bilateral bunionettes   FOOT SURGERY Right 01/07/14   Pt states surgery was due to arthritis. "Has pins in place now"   FOREIGN BODY REMOVAL Right 09/27/2019   Procedure: REMOVAL FOREIGN BOD RIGHT FOOT;  Surgeon: Beverely Low, MD;  Location: [facility];  Service: Orthopedics;  Laterality: Right;  FRACTURE SURGERY Left 2019   wrist   GASTRIC BYPASS  2005   JOINT REPLACEMENT  2008   left total knee   KNEE SURGERY  1997   left knee   LYMPH NODE DISSECTION Right 11/09/2018   Procedure: EXCISE DEEP RIGHT CERVICAL LYMPH NODE;  Surgeon: Claud Kelp, MD;  Location: [facility] OR;  Service: General;  Laterality: Right;   NASAL SINUS SURGERY  1989   Dr [facility]   REFRACTIVE SURGERY  2006   lasik   REVERSE SHOULDER ARTHROPLASTY Left 09/27/2019   Procedure: REVERSE SHOULDER ARTHROPLASTY;  Surgeon: Beverely Low, MD;  Location: [facility];  Service: Orthopedics;  Laterality: Left;  2 hrs General with intrascalene block   SHOULDER SURGERY  2 2012   RIGHT    STOMACH  SURGERY  2007   "tummy tuck"   TOTAL KNEE ARTHROPLASTY Right 12/31/2015   Procedure: RIGHT TOTAL KNEE ARTHROPLASTY;  Surgeon: Jene Every, MD;  Location: [facility];  Service: Orthopedics;  Laterality: Right;  Adductor Block   VARICOSE VEIN SURGERY Right 02/2012   leg    Family History  Problem Relation Age of Onset   Diabetes Paternal Grandfather    Stroke Paternal Grandfather    Hyperlipidemia Mother    Hypertension Mother    Hypertension Father    Diabetes Father    Prostate cancer Father    Dementia Father    Lung cancer Father    Heart disease Maternal Grandmother    Hypertension Maternal Grandmother    Heart disease Maternal Grandfather    Hypertension Maternal Grandfather    Stroke Maternal Grandfather    Arthritis Brother    Testicular cancer Brother    Colon cancer Neg Hx    Esophageal cancer Neg Hx    Pancreatic cancer Neg Hx    Stomach cancer Neg Hx     Social History   Socioeconomic History   Marital status: Married    Spouse name: Not on file   Number of children: 1   Years of education: Not on file   Highest education level: Not on file  Occupational History   Occupation: Event organiser: [employer]  Tobacco Use   Smoking status: Never   Smokeless tobacco: Never  Vaping Use   Vaping status: Never Used  Substance and Sexual Activity   Alcohol use: Not Currently    Comment: RARE   Drug use: No   Sexual activity: Yes    Comment: 1st intercourse 67 yo-More than 5 partners  Other Topics Concern   Not on file  Social History Narrative   Married    daughter (Bipolar/schizophrenia)   granddaughter   Retired at age 56   Social Determinants of Corporate investment banker Strain: Not on file  Food Insecurity: No Food Insecurity (06/30/2020)   Received from [facility], [facility]   Hunger Vital Sign    Worried About Running Out of Food in the Last Year: Never true    Ran Out of Food in the Last Year: Never true  Transportation  Needs: Not on file  Physical Activity: Not on file  Stress: Not on file  Social Connections: Unknown (06/17/2021)   Received [facility], [facility]   Social Network    Social Network: Not on file  Intimate Partner Violence: Unknown (05/21/2021)   Received from [facility], [facility]   HITS    Physically Hurt: Not on file    Insult or Talk Down  To: Not on file    Threaten Physical Harm: Not on file    Scream or Curse: Not on file    Outpatient Medications Prior to Visit  Medication Sig Dispense Refill   acetaminophen (TYLENOL) 650 MG CR tablet Take 2 tablets (1,300 mg total) by mouth every 8 (eight) hours as needed for pain. Take for mild pain. Do not combine with Percocet. Do not exceed daily recommended limit of Tylenol.     ALPRAZolam (XANAX) 1 MG tablet TAKE 1 TABLET (1 MG TOTAL) BY MOUTH 2 (TWO) TIMES DAILY AS NEEDED FOR ANXIETY AT BEDTIME AS NEEDED 30 tablet 0   Ascorbic Acid (VITAMIN C) 1000 MG tablet Take 1,000 mg by mouth at bedtime.     busPIRone (BUSPAR) 15 MG tablet Take 1 tablet (15 mg total) by mouth 2 (two) times daily. 180 tablet 1   Calcium Carb-Cholecalciferol (CALTRATE 600+D3 PO) Take 1 tablet by mouth in the morning and at bedtime.     Cholecalciferol (VITAMIN D3) 75 MCG (3000 UT) TABS Take 1 tablet by mouth daily. 30 tablet    COVID-19 mRNA vaccine 2023-2024 (COMIRNATY) syringe Inject into the muscle. 0.3 mL 0   denosumab (PROLIA) 60 MG/ML SOSY injection Inject 60 mg into the skin every 6 (six) months. 1 mL 3   diclofenac Sodium (VOLTAREN) 1 % GEL APPLY 2 G TOPICALLY 2 (TWO) TIMES DAILY AS NEEDED. 100 g 3   ferrous sulfate 325 (65 FE) MG tablet Take 325 mg by mouth in the morning and at bedtime.     fexofenadine (ALLEGRA) 180 MG tablet Take 180 mg by mouth at bedtime.     fluticasone (FLONASE) 50 MCG/ACT nasal spray Place 2 sprays into both nostrils daily. 16 mL 5   montelukast (SINGULAIR) 10 MG tablet TAKE 1 TABLET BY MOUTH EVERYDAY AT BEDTIME 90 tablet  1   Multiple Vitamin (MULTIVITAMIN WITH MINERALS) TABS tablet Take 1 tablet by mouth at bedtime.     Multiple Vitamins-Minerals (PRESERVISION AREDS 2) CAPS Take 1 tablet by mouth daily.     MYRBETRIQ 50 MG TB24 tablet Take 50 mg by mouth at bedtime.   2   pantoprazole (PROTONIX) 40 MG tablet Take 1 tablet (40 mg total) by mouth daily. 90 tablet 1   PARoxetine (PAXIL) 40 MG tablet TAKE 1 TABLET BY MOUTH EVERY DAY IN THE MORNING 90 tablet 2   pilocarpine (SALAGEN) 5 MG tablet TAKE ONE TABLET BY MOUTH THREE TIMES A DAY ( IN THE MORNING , AT NOON AND AT BEDTIME ) 60 tablet 5   sodium chloride (OCEAN) 0.65 % SOLN nasal spray Place 1-2 sprays into both nostrils 4 (four) times daily as needed for congestion.     Sodium Fluoride (SODIUM FLUORIDE 5000 PPM) 1.1 % PSTE Use as directed. 100 mL 2   valACYclovir (VALTREX) 500 MG tablet Take 1 tablet (500 mg total) by mouth daily as needed. 90 tablet 1   Vitamin D, Ergocalciferol, (DRISDOL) 1.25 MG (50000 UNIT) CAPS capsule Take 1 capsule (50,000 Units total) by mouth every 7 (seven) days. 12 capsule 1   zinc gluconate 50 MG tablet Take 50 mg by mouth at bedtime.      No facility-administered medications prior to visit.    Allergies  Allergen Reactions   Nitrofurantoin Other (See Comments)    REACTION: fever   Other Other (See Comments)   Ibuprofen Nausea Only    REACTION: Post gastric bypass-->can not take due to surgery.  No allergy. REACTION:  Post gastric bypass-->can not take due to surgery.  No allergy.    ROS See HPI    Objective:    Physical Exam Constitutional:      General: She is not in acute distress.    Appearance: Normal appearance. She is well-developed.  HENT:     Head: Normocephalic and atraumatic.     Right Ear: External ear normal.     Left Ear: External ear normal.  Eyes:     General: No scleral icterus. Neck:     Thyroid: No thyromegaly.  Cardiovascular:     Rate and Rhythm: Normal rate and regular rhythm.     Heart  sounds: Normal heart sounds. No murmur heard. Pulmonary:     Effort: Pulmonary effort is normal. No respiratory distress.     Breath sounds: Examination of the right-lower field reveals rales. Rales present. No wheezing.  Musculoskeletal:     Cervical back: Neck supple.  Skin:    General: Skin is warm and dry.  Neurological:     Mental Status: She is alert and oriented to person, place, and time.  Psychiatric:        Mood and Affect: Mood normal.        Behavior: Behavior normal.        Thought Content: Thought content normal.        Judgment: Judgment normal.      BP (!) 155/85 (BP Location: Right Arm, Patient Position: Sitting, Cuff Size: Large)   Pulse 84   Temp 97.7 F (36.5 C) (Oral)   Resp 16   Wt 220 lb (99.8 kg)   SpO2 100%   BMI 37.76 kg/m  Wt Readings from Last 3 Encounters:  12/06/22 220 lb (99.8 kg)  10/21/22 218 lb 6.4 oz (99.1 kg)  09/30/22 223 lb (101.2 kg)       Assessment & Plan:   Problem List Items Addressed This Visit       Unprioritized   URI with cough and congestion     Symptoms of headache, sneezing, runny nose, sore throat, and cough for 9 days. No fever. No improvement with Mucinex. Auscultation revealed crackles in the left lung.  -Order chest x-ray to rule out pneumonia.  -Continue Mucinex.  -If no improvement in 2-3 days, patient to notify the office.  -Prescribe Tessalon for cough, particularly at night.  -Prescribe Albuterol inhaler for wheezing.  -Call if symptoms worsen or if not improved in 3 days.        Other Visit Diagnoses     Lung field abnormal finding on examination    -  Primary   Relevant Orders   DG Chest 2 View       I am having Rihanna T. Bentson start on albuterol and benzonatate. I am also having her maintain her Myrbetriq, acetaminophen, zinc gluconate, multivitamin with minerals, vitamin C, ferrous sulfate, fexofenadine, Calcium Carb-Cholecalciferol (CALTRATE 600+D3 PO), sodium chloride, Vitamin D3,  PreserVision AREDS 2, Comirnaty, diclofenac Sodium, Vitamin D (Ergocalciferol), PARoxetine, denosumab, pilocarpine, fluticasone, montelukast, Sodium Fluoride 5000 PPM, valACYclovir, busPIRone, pantoprazole, and ALPRAZolam.  Meds ordered this encounter  Medications   albuterol (VENTOLIN HFA) 108 (90 Base) MCG/ACT inhaler    Sig: Inhale 2 puffs into the lungs every 6 (six) hours as needed for wheezing or shortness of breath.    Dispense:  6.7 g    Refill:  0    Order Specific Question:   Supervising Provider    Answer:   Danise Edge A [[id number]]  benzonatate (TESSALON) 100 MG capsule    Sig: Take 1 capsule (100 mg total) by mouth 2 (two) times daily as needed for cough.    Dispense:  20 capsule    Refill:  0    Order Specific Question:   Supervising Provider    Answer:   Danise Edge A [[id number]]

## 2022-12-06 NOTE — Patient Instructions (Signed)
VISIT SUMMARY:  You came in today with a 10-day history of upper respiratory symptoms, including headache, sneezing, runny nose, ear pain, and occasional headaches. You have been taking Mucinex for relief but have not seen significant improvement. During the visit, your blood pressure was elevated, which may be related to your current illness and use of decongestants.  YOUR PLAN:  -UPPER RESPIRATORY INFECTION: An upper respiratory infection is a viral or bacterial infection affecting the nose, throat, and airways. We will order a chest x-ray to rule out pneumonia, continue Mucinex, and prescribe Tessalon for your cough and an Albuterol inhaler for wheezing. If you do not see improvement in 2-3 days, please notify our office.  -HYPERTENSION: Hypertension, or high blood pressure, was noted during your visit. This may be due to your current illness and the use of decongestants. We will monitor your blood pressure at your next visit.  INSTRUCTIONS:  Please get a chest x-ray as ordered. Continue taking Mucinex and start using Tessalon for your cough and the Albuterol inhaler for wheezing. If your symptoms do not improve in 2-3 days, contact our office. We will also monitor your blood pressure at your next visit.

## 2022-12-08 ENCOUNTER — Ambulatory Visit: Payer: Medicare Other | Admitting: Physical Therapy

## 2022-12-09 ENCOUNTER — Encounter: Payer: Self-pay | Admitting: Family

## 2022-12-09 DIAGNOSIS — M542 Cervicalgia: Secondary | ICD-10-CM | POA: Diagnosis not present

## 2022-12-09 MED ORDER — AMOXICILLIN-POT CLAVULANATE 875-125 MG PO TABS
1.0000 | ORAL_TABLET | Freq: Two times a day (BID) | ORAL | 0 refills | Status: DC
Start: 2022-12-09 — End: 2022-12-29

## 2022-12-11 ENCOUNTER — Encounter: Payer: Self-pay | Admitting: Family

## 2022-12-11 MED ORDER — PAROXETINE HCL 40 MG PO TABS: 40.0000 mg | ORAL_TABLET | ORAL | 1 refills | Status: DC

## 2022-12-12 ENCOUNTER — Ambulatory Visit: Payer: Medicare Other | Admitting: Physical Therapy

## 2022-12-29 ENCOUNTER — Ambulatory Visit (INDEPENDENT_AMBULATORY_CARE_PROVIDER_SITE_OTHER): Payer: Medicare Other | Admitting: Physician Assistant

## 2022-12-29 ENCOUNTER — Other Ambulatory Visit (HOSPITAL_BASED_OUTPATIENT_CLINIC_OR_DEPARTMENT_OTHER): Payer: Self-pay

## 2022-12-29 ENCOUNTER — Encounter: Payer: Self-pay | Admitting: Physician Assistant

## 2022-12-29 VITALS — BP 111/58 | HR 76 | Temp 98.0°F | Ht 64.0 in | Wt 222.1 lb

## 2022-12-29 DIAGNOSIS — J309 Allergic rhinitis, unspecified: Secondary | ICD-10-CM

## 2022-12-29 DIAGNOSIS — N3000 Acute cystitis without hematuria: Secondary | ICD-10-CM | POA: Diagnosis not present

## 2022-12-29 DIAGNOSIS — R300 Dysuria: Secondary | ICD-10-CM

## 2022-12-29 LAB — POCT URINALYSIS DIP (MANUAL ENTRY)
Bilirubin, UA: NEGATIVE
Blood, UA: NEGATIVE
Glucose, UA: NEGATIVE mg/dL
Ketones, POC UA: NEGATIVE mg/dL
Nitrite, UA: POSITIVE — AB
Protein Ur, POC: NEGATIVE mg/dL
Spec Grav, UA: 1.01 (ref 1.010–1.025)
Urobilinogen, UA: 0.2 U/dL
pH, UA: 6 (ref 5.0–8.0)

## 2022-12-29 MED ORDER — CEFDINIR 300 MG PO CAPS
300.0000 mg | ORAL_CAPSULE | Freq: Two times a day (BID) | ORAL | 0 refills | Status: AC
  Filled 2022-12-29: qty 10, 5d supply, fill #0

## 2022-12-29 NOTE — Progress Notes (Signed)
Established patient visit   Patient: [name] [name]   DOB: [birth year]   67 y.o. Female  MRN: [id number] Visit Date: 12/29/2022  Today's healthcare provider: Alfredia Ferguson, PA-C   Chief Complaint  Patient presents with   Urinary Tract Infection    Burning, iching- has been using the bathroom a lot    URI    10/22 was seen for sinus, was feeling better but the congestion is still present.    Subjective    Pt reports she has finished her antibiotics for her sinus infection, and was feeling better, but had work done on her house and since then has had recurrent symptoms. Denies fevers.  She reports some intermittent dizziness when waking up in the morning. If she sits on the edge of her bed the symptoms will resolve in a few minutes.  She reports loose bowels since taking augmentin, thinks this has caused a bladder infection. Reports dysuria.  Medications: Outpatient Medications Prior to Visit  Medication Sig   acetaminophen (TYLENOL) 650 MG CR tablet Take 2 tablets (1,300 mg total) by mouth every 8 (eight) hours as needed for pain. Take for mild pain. Do not combine with Percocet. Do not exceed daily recommended limit of Tylenol.   albuterol (VENTOLIN HFA) 108 (90 Base) MCG/ACT inhaler Inhale 2 puffs into the lungs every 6 (six) hours as needed for wheezing or shortness of breath.   ALPRAZolam (XANAX) 1 MG tablet TAKE 1 TABLET (1 MG TOTAL) BY MOUTH 2 (TWO) TIMES DAILY AS NEEDED FOR ANXIETY AT BEDTIME AS NEEDED   Ascorbic Acid (VITAMIN C) 1000 MG tablet Take 1,000 mg by mouth at bedtime.   benzonatate (TESSALON) 100 MG capsule Take 1 capsule (100 mg total) by mouth 2 (two) times daily as needed for cough.   busPIRone (BUSPAR) 15 MG tablet Take 1 tablet (15 mg total) by mouth 2 (two) times daily.   Calcium Carb-Cholecalciferol (CALTRATE 600+D3 PO) Take 1 tablet by mouth in the morning and at bedtime.   Cholecalciferol (VITAMIN D3) 75 MCG (3000 UT) TABS Take 1 tablet by mouth  daily.   COVID-19 mRNA vaccine 2023-2024 (COMIRNATY) syringe Inject into the muscle.   denosumab (PROLIA) 60 MG/ML SOSY injection Inject 60 mg into the skin every 6 (six) months.   diclofenac Sodium (VOLTAREN) 1 % GEL APPLY 2 G TOPICALLY 2 (TWO) TIMES DAILY AS NEEDED.   ferrous sulfate 325 (65 FE) MG tablet Take 325 mg by mouth in the morning and at bedtime.   fexofenadine (ALLEGRA) 180 MG tablet Take 180 mg by mouth at bedtime.   fluticasone (FLONASE) 50 MCG/ACT nasal spray Place 2 sprays into both nostrils daily.   Multiple Vitamin (MULTIVITAMIN WITH MINERALS) TABS tablet Take 1 tablet by mouth at bedtime.   Multiple Vitamins-Minerals (PRESERVISION AREDS 2) CAPS Take 1 tablet by mouth daily.   MYRBETRIQ 50 MG TB24 tablet Take 50 mg by mouth at bedtime.    pantoprazole (PROTONIX) 40 MG tablet Take 1 tablet (40 mg total) by mouth daily.   PARoxetine (PAXIL) 40 MG tablet Take 1 tablet (40 mg total) by mouth every morning.   pilocarpine (SALAGEN) 5 MG tablet TAKE ONE TABLET BY MOUTH THREE TIMES A DAY ( IN THE MORNING , AT NOON AND AT BEDTIME )   sodium chloride (OCEAN) 0.65 % SOLN nasal spray Place 1-2 sprays into both nostrils 4 (four) times daily as needed for congestion.   Sodium Fluoride (SODIUM FLUORIDE 5000 PPM) 1.1 %  PSTE Use as directed.   valACYclovir (VALTREX) 500 MG tablet Take 1 tablet (500 mg total) by mouth daily as needed.   Vitamin D, Ergocalciferol, (DRISDOL) 1.25 MG (50000 UNIT) CAPS capsule Take 1 capsule (50,000 Units total) by mouth every 7 (seven) days.   zinc gluconate 50 MG tablet Take 50 mg by mouth at bedtime.    [DISCONTINUED] amoxicillin-clavulanate (AUGMENTIN) 875-125 MG tablet Take 1 tablet by mouth 2 (two) times daily.   [DISCONTINUED] montelukast (SINGULAIR) 10 MG tablet TAKE 1 TABLET BY MOUTH EVERYDAY AT BEDTIME   No facility-administered medications prior to visit.    Review of Systems  Constitutional:  Negative for fatigue and fever.  HENT:  Positive for  congestion, postnasal drip and rhinorrhea.   Respiratory:  Negative for cough and shortness of breath.   Cardiovascular:  Negative for chest pain and leg swelling.  Gastrointestinal:  Positive for diarrhea. Negative for abdominal pain.  Genitourinary:  Positive for dysuria.  Neurological:  Negative for dizziness and headaches.        Objective    BP (!) 111/58   Pulse 76   Temp 98 F (36.7 C) (Oral)   Ht 5\' 4"  (1.626 m)   Wt 222 lb 2 oz (100.8 kg)   SpO2 99%   BMI 38.13 kg/m     Physical Exam Constitutional:      General: She is awake.     Appearance: She is well-developed.  HENT:     Head: Normocephalic.     Mouth/Throat:     Pharynx: Posterior oropharyngeal erythema present. No oropharyngeal exudate.  Eyes:     Conjunctiva/sclera: Conjunctivae normal.  Cardiovascular:     Rate and Rhythm: Normal rate.  Pulmonary:     Effort: Pulmonary effort is normal.     Breath sounds: Normal breath sounds.  Skin:    General: Skin is warm.  Neurological:     Mental Status: She is alert and oriented to person, place, and time.  Psychiatric:        Attention and Perception: Attention normal.        Mood and Affect: Mood normal.        Speech: Speech normal.        Behavior: Behavior is cooperative.     Results for orders placed or performed in visit on 12/29/22  POCT urinalysis dipstick  Result Value Ref Range   Color, UA yellow yellow   Clarity, UA cloudy (A) clear   Glucose, UA negative negative mg/dL   Bilirubin, UA negative negative   Ketones, POC UA negative negative mg/dL   Spec Grav, UA 4.098 1.191 - 1.025   Blood, UA negative negative   pH, UA 6.0 5.0 - 8.0   Protein Ur, POC negative negative mg/dL   Urobilinogen, UA 0.2 0.2 or 1.0 E.U./dL   Nitrite, UA Positive (A) Negative   Leukocytes, UA Large (3+) (A) Negative    Assessment & Plan    Allergic rhinitis, unspecified seasonality, unspecified trigger  Acute cystitis without hematuria -     Cefdinir; Take  1 capsule (300 mg total) by mouth 2 (two) times daily for 5 days.  Dispense: 10 capsule; Refill: 0  Dysuria -     POCT Urinalysis Dipstick (Automated) -     POCT urinalysis dipstick -     Urine Culture   UA positive leuks and nitrites. Reviewed last culture-- resistant to cipro, bactrim. Pt has allergy to macrobid and reports new allergy to keflex given a  few mo ago. Caused diarrhea.  Will tx with omnicef. Ordered culture.  Recommending saline rinses, cont flonase/antihistamines for sinus symptoms.  Return if symptoms worsen or fail to improve.      Alfredia Ferguson, PA-C  [facility] at [facility] [phone] (phone) [phone] (fax)  [facility]

## 2022-12-31 LAB — URINE CULTURE
MICRO NUMBER:: 15730989
SPECIMEN QUALITY:: ADEQUATE

## 2023-01-02 ENCOUNTER — Encounter: Payer: Self-pay | Admitting: Family

## 2023-01-03 MED ORDER — ALPRAZOLAM 1 MG PO TABS: ORAL_TABLET | ORAL | 0 refills | Status: DC

## 2023-01-03 NOTE — Telephone Encounter (Signed)
Requesting: alprazolam 1mg  Contract: 11/17/22 UDS: 12/15/21 Last Visit: 12/06/22 Next Visit:01/10/23 Last Refill: 12/04/22 #30 and 0RF  Please Advise

## 2023-01-06 DIAGNOSIS — D869 Sarcoidosis, unspecified: Secondary | ICD-10-CM | POA: Diagnosis not present

## 2023-01-06 DIAGNOSIS — M1991 Primary osteoarthritis, unspecified site: Secondary | ICD-10-CM | POA: Diagnosis not present

## 2023-01-06 DIAGNOSIS — M810 Age-related osteoporosis without current pathological fracture: Secondary | ICD-10-CM | POA: Diagnosis not present

## 2023-01-08 ENCOUNTER — Encounter: Payer: Self-pay | Admitting: Family

## 2023-01-09 DIAGNOSIS — H353131 Nonexudative age-related macular degeneration, bilateral, early dry stage: Secondary | ICD-10-CM | POA: Diagnosis not present

## 2023-01-09 DIAGNOSIS — H35363 Drusen (degenerative) of macula, bilateral: Secondary | ICD-10-CM | POA: Diagnosis not present

## 2023-01-09 NOTE — Telephone Encounter (Signed)
Pt called back to follow up on her mychart message. Advised that message was forwarded to her provider but that they are in clinic so please allow more time.

## 2023-01-10 ENCOUNTER — Encounter: Payer: Self-pay | Admitting: Family

## 2023-01-10 ENCOUNTER — Ambulatory Visit (INDEPENDENT_AMBULATORY_CARE_PROVIDER_SITE_OTHER): Payer: Medicare Other | Admitting: Family

## 2023-01-10 ENCOUNTER — Other Ambulatory Visit (HOSPITAL_BASED_OUTPATIENT_CLINIC_OR_DEPARTMENT_OTHER): Payer: Self-pay

## 2023-01-10 VITALS — BP 127/60 | HR 80 | Temp 98.6°F | Resp 16 | Ht 64.0 in | Wt 221.0 lb

## 2023-01-10 DIAGNOSIS — I10 Essential (primary) hypertension: Secondary | ICD-10-CM | POA: Diagnosis not present

## 2023-01-10 DIAGNOSIS — M810 Age-related osteoporosis without current pathological fracture: Secondary | ICD-10-CM | POA: Diagnosis not present

## 2023-01-10 DIAGNOSIS — E559 Vitamin D deficiency, unspecified: Secondary | ICD-10-CM | POA: Diagnosis not present

## 2023-01-10 DIAGNOSIS — D869 Sarcoidosis, unspecified: Secondary | ICD-10-CM | POA: Diagnosis not present

## 2023-01-10 DIAGNOSIS — N3001 Acute cystitis with hematuria: Secondary | ICD-10-CM | POA: Insufficient documentation

## 2023-01-10 DIAGNOSIS — H3530 Unspecified macular degeneration: Secondary | ICD-10-CM | POA: Insufficient documentation

## 2023-01-10 DIAGNOSIS — Z0000 Encounter for general adult medical examination without abnormal findings: Secondary | ICD-10-CM | POA: Diagnosis not present

## 2023-01-10 DIAGNOSIS — H353190 Nonexudative age-related macular degeneration, unspecified eye, stage unspecified: Secondary | ICD-10-CM

## 2023-01-10 DIAGNOSIS — Z9884 Bariatric surgery status: Secondary | ICD-10-CM

## 2023-01-10 DIAGNOSIS — E611 Iron deficiency: Secondary | ICD-10-CM | POA: Diagnosis not present

## 2023-01-10 DIAGNOSIS — M47812 Spondylosis without myelopathy or radiculopathy, cervical region: Secondary | ICD-10-CM | POA: Diagnosis not present

## 2023-01-10 LAB — POC URINALSYSI DIPSTICK (AUTOMATED)
Bilirubin, UA: NEGATIVE
Blood, UA: NEGATIVE
Glucose, UA: NEGATIVE
Ketones, UA: NEGATIVE
Nitrite, UA: NEGATIVE
Protein, UA: NEGATIVE
Spec Grav, UA: 1.02 (ref 1.010–1.025)
Urobilinogen, UA: 0.2 U/dL
pH, UA: 6 (ref 5.0–8.0)

## 2023-01-10 LAB — CBC WITH DIFFERENTIAL/PLATELET
Basophils Absolute: 0 10*3/uL (ref 0.0–0.1)
Basophils Relative: 0.6 % (ref 0.0–3.0)
Eosinophils Absolute: 0.2 10*3/uL (ref 0.0–0.7)
Eosinophils Relative: 4.7 % (ref 0.0–5.0)
HCT: 39 % (ref 36.0–46.0)
Hemoglobin: 12.6 g/dL (ref 12.0–15.0)
Lymphocytes Relative: 28 % (ref 12.0–46.0)
Lymphs Abs: 1.4 10*3/uL (ref 0.7–4.0)
MCHC: 32.4 g/dL (ref 30.0–36.0)
MCV: 90.9 fL (ref 78.0–100.0)
Monocytes Absolute: 0.4 10*3/uL (ref 0.1–1.0)
Monocytes Relative: 9.2 % (ref 3.0–12.0)
Neutro Abs: 2.8 10*3/uL (ref 1.4–7.7)
Neutrophils Relative %: 57.5 % (ref 43.0–77.0)
Platelets: 246 10*3/uL (ref 150.0–400.0)
RBC: 4.29 Mil/uL (ref 3.87–5.11)
RDW: 14.5 % (ref 11.5–15.5)
WBC: 4.9 10*3/uL (ref 4.0–10.5)

## 2023-01-10 LAB — COMPREHENSIVE METABOLIC PANEL
ALT: 19 U/L (ref 0–35)
AST: 21 U/L (ref 0–37)
Albumin: 4.1 g/dL (ref 3.5–5.2)
Alkaline Phosphatase: 110 U/L (ref 39–117)
BUN: 15 mg/dL (ref 6–23)
CO2: 28 meq/L (ref 19–32)
Calcium: 9.1 mg/dL (ref 8.4–10.5)
Chloride: 110 meq/L (ref 96–112)
Creatinine, Ser: 0.51 mg/dL (ref 0.40–1.20)
GFR: 97.07 mL/min (ref >60.00)
Glucose, Bld: 89 mg/dL (ref 70–99)
Potassium: 3.8 meq/L (ref 3.5–5.1)
Sodium: 144 meq/L (ref 135–145)
Total Bilirubin: 0.5 mg/dL (ref 0.2–1.2)
Total Protein: 6.4 g/dL (ref 6.0–8.3)

## 2023-01-10 LAB — VITAMIN B12: Vitamin B-12: >1537 pg/mL — ABNORMAL HIGH (ref 211–911)

## 2023-01-10 LAB — VITAMIN D 25 HYDROXY (VIT D DEFICIENCY, FRACTURES): VITD: 26.63 ng/mL — ABNORMAL LOW (ref 30.00–100.00)

## 2023-01-10 MED ORDER — VITAMIN D (ERGOCALCIFEROL) 1.25 MG (50000 UNIT) PO CAPS: 50000.0000 [IU] | ORAL_CAPSULE | ORAL | Status: DC

## 2023-01-10 MED ORDER — SULFAMETHOXAZOLE-TRIMETHOPRIM 800-160 MG PO TABS
1.0000 | ORAL_TABLET | Freq: Two times a day (BID) | ORAL | 0 refills | Status: AC
  Filled 2023-01-10: qty 14, 7d supply, fill #0

## 2023-01-10 NOTE — Assessment & Plan Note (Signed)
Dry followed by Dr. Allena Katz Retinal specialist in [place].  This is a new diagnosis. She is maintained on Areds vitamin.

## 2023-01-10 NOTE — Assessment & Plan Note (Signed)
Recurrent symptoms.  Will rx with bactrim. Most recent culture showed bactrim sensitive E. Coli. Send urine for culture.

## 2023-01-10 NOTE — Patient Instructions (Signed)
VISIT SUMMARY:  During today's visit, we discussed your recent back pain, recurrent urinary tract infection (UTI), macular degeneration, osteoporosis, and anxiety. We also reviewed your general health maintenance plan.  YOUR PLAN:  -URINARY TRACT INFECTION: A urinary tract infection (UTI) is an infection in any part of your urinary system. Since your symptoms have returned after the initial treatment, we will start you on Bactrim DS twice daily for 10 days and reculture your urine to confirm the bacteria's sensitivity to the antibiotic.  -BACK PAIN: Your back pain is new and affects your entire back without radiating to other areas. We recommend conservative management with rest and over-the-counter pain relievers.  -MACULAR DEGENERATION: Macular degeneration is an eye condition that can cause vision loss. You have the dry type, which affects your ability to see at night. Continue taking AREDS2 vitamins as recommended by your eye doctor.  -OSTEOPOROSIS: Osteoporosis is a condition where bones become weak and brittle. Continue with your annual Reclast infusions and we will schedule a bone density scan in November 2024.  -ANXIETY: Anxiety is a feeling of worry or fear that can be managed with medication. Continue your current regimen of buspirone.  -GENERAL HEALTH MAINTENANCE: For your overall health, continue with your annual mammograms, gynecological follow-ups, and dental check-ups. Your next colonoscopy is due in 2028. We will also check your Vitamin D3 and B12 levels.  INSTRUCTIONS:  Please follow up in 6 months or sooner if your UTI symptoms do not improve.

## 2023-01-10 NOTE — Assessment & Plan Note (Signed)
BP stable without meds.  Monitor.

## 2023-01-10 NOTE — Assessment & Plan Note (Signed)
  History of long-term prednisone use and bariatric surgery. -Continue annual Reclast infusions. Next due 7/25.  -Order bone density scan in November 2024.
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
## 2023-01-10 NOTE — Assessment & Plan Note (Signed)
Continues to follow with rheurmatology, Dr. Dierdre Forth.

## 2023-01-10 NOTE — Assessment & Plan Note (Signed)
-  Continue annual mammograms and gynecological follow-ups for abnormal Pap smear. -Continue annual dental check-ups. -Colonoscopy follow-up in 2028. -Check Vitamin D3 and B12 levels. -Follow-up in 6 months or sooner if UTI symptoms do not improve.

## 2023-01-11 ENCOUNTER — Telehealth: Payer: Self-pay | Admitting: Family

## 2023-01-11 MED ORDER — VITAMIN B-12 1000 MCG PO TABS: 1000.0000 ug | ORAL_TABLET | Freq: Every day | ORAL | Status: DC

## 2023-01-11 NOTE — Telephone Encounter (Signed)
See mychart.  

## 2023-01-11 NOTE — Addendum Note (Signed)
Addended by: Sandford Craze on: 01/11/2023 03:51 PM   Modules accepted: Orders

## 2023-01-12 LAB — IRON,TIBC AND FERRITIN PANEL
%SAT: 32 % (ref 16–45)
Ferritin: 299 ng/mL — ABNORMAL HIGH (ref 16–288)
Iron: 73 ug/dL (ref 45–160)
TIBC: 231 ug/dL — ABNORMAL LOW (ref 250–450)

## 2023-01-12 LAB — URINE CULTURE
MICRO NUMBER:: 15781722
SPECIMEN QUALITY:: ADEQUATE

## 2023-01-17 DIAGNOSIS — N3000 Acute cystitis without hematuria: Secondary | ICD-10-CM | POA: Diagnosis not present

## 2023-01-17 DIAGNOSIS — N3281 Overactive bladder: Secondary | ICD-10-CM | POA: Diagnosis not present

## 2023-01-19 ENCOUNTER — Other Ambulatory Visit: Payer: Self-pay | Admitting: Family

## 2023-01-19 DIAGNOSIS — E559 Vitamin D deficiency, unspecified: Secondary | ICD-10-CM

## 2023-01-23 ENCOUNTER — Ambulatory Visit (HOSPITAL_BASED_OUTPATIENT_CLINIC_OR_DEPARTMENT_OTHER)
Admission: RE | Admit: 2023-01-23 | Discharge: 2023-01-23 | Disposition: A | Discharge status: Home / Self Care | Payer: Medicare Other | Source: Ambulatory Visit | Attending: Family | Admitting: Family

## 2023-01-23 ENCOUNTER — Encounter (HOSPITAL_BASED_OUTPATIENT_CLINIC_OR_DEPARTMENT_OTHER): Payer: Self-pay

## 2023-01-23 DIAGNOSIS — M810 Age-related osteoporosis without current pathological fracture: Secondary | ICD-10-CM | POA: Diagnosis not present

## 2023-01-23 DIAGNOSIS — D869 Sarcoidosis, unspecified: Secondary | ICD-10-CM | POA: Insufficient documentation

## 2023-01-23 DIAGNOSIS — Z1231 Encounter for screening mammogram for malignant neoplasm of breast: Secondary | ICD-10-CM | POA: Diagnosis not present

## 2023-01-23 DIAGNOSIS — Z780 Asymptomatic menopausal state: Secondary | ICD-10-CM | POA: Insufficient documentation

## 2023-01-23 DIAGNOSIS — Z9884 Bariatric surgery status: Secondary | ICD-10-CM | POA: Insufficient documentation

## 2023-01-23 DIAGNOSIS — Z13820 Encounter for screening for osteoporosis: Secondary | ICD-10-CM | POA: Diagnosis not present

## 2023-01-23 DIAGNOSIS — Z7952 Long term (current) use of systemic steroids: Secondary | ICD-10-CM | POA: Insufficient documentation

## 2023-01-23 DIAGNOSIS — Z139 Encounter for screening, unspecified: Secondary | ICD-10-CM | POA: Insufficient documentation

## 2023-01-23 DIAGNOSIS — E559 Vitamin D deficiency, unspecified: Secondary | ICD-10-CM | POA: Insufficient documentation

## 2023-01-24 ENCOUNTER — Other Ambulatory Visit: Payer: Self-pay | Admitting: Family

## 2023-01-25 ENCOUNTER — Encounter: Payer: Self-pay | Admitting: Obstetrics and Gynecology

## 2023-01-25 NOTE — Progress Notes (Unsigned)
ANNUAL EXAM Patient name: [name] [name] MRN [id number]  Date of birth: 07-16-1955 Chief Complaint:   No chief complaint on file.  History of Present Illness:   [name] [name] is a 67 y.o. G1P1 female being seen today for a routine annual exam.   Current complaints: ***   Pap History: She had cervical dysplasia over 30 years ago and had normal paps until 2018.  2018: Pap normal, HPV pos 2022: ASCUS/HPV pos > Colpo CIN1, ECC neg 2023: Pap wnl/HPV neg, Colpo CIN1, ECC Negative  MXR was on 01/22/22 - Bir[facility]  Health Maintenance Due  Topic Date Due   Medicare Annual Wellness (AWV)  Never done   COVID-19 Vaccine (4 - 2023-24 season) 10/16/2022    Review of Systems:   Pertinent items are noted in HPI Denies any headaches, blurred vision, fatigue, shortness of breath, chest pain, abdominal pain, abnormal vaginal discharge/itching/odor/irritation, problems with periods, bowel movements, urination, or intercourse unless otherwise stated above.  Pertinent History Reviewed:  Reviewed past medical,surgical, social and family history.  Reviewed problem list, medications and allergies. Physical Assessment:   There were no vitals filed for this visit. There is no height or weight on file to calculate BMI.   Physical Examination:  General appearance - well appearing, and in no distress Mental status - alert, oriented to person, place, and time Psych:  She has a normal mood and affect Skin - warm and dry, normal color, no suspicious lesions noted Chest - effort normal Heart - normal rate  Breasts - breasts appear normal, no suspicious masses, no skin or nipple changes or axillary nodes Abdomen - soft, nontender, nondistended, no masses or organomegaly Pelvic -  VULVA: normal appearing vulva with no masses, tenderness or lesions  VAGINA: normal appearing vagina with normal color and discharge, no lesions  CERVIX: normal appearing cervix without discharge or lesions, no CMT UTERUS:  uterus is felt to be normal size, shape, consistency and nontender  ADNEXA: No adnexal masses or tenderness noted. Extremities:  No swelling or varicosities noted  Chaperone present for exam  No results found for this or any previous visit (from the past 24 hour(s)).   GYNECOLOGY OFFICE COLPOSCOPY PROCEDURE NOTE  67 y.o. G1P1 here for colposcopy for  history of abnormal pap smears. She would like colpscopy and pap smear on the same day.     Pregnancy test:  not indicated  Informed consent and review of risks, benefit and alternatives performed. Written consent given.   Speculum inserted into patient's vagina assuring full view of cervix and vaginal walls. 3 swabs of vinegar solution applied to the cervix and vaginal walls and colposcope was used to observe both the cervix and vaginal walls.   Colposcopy adequate? Yes  no visible lesions or AWE; random biopsies obtained from 1 and 7 oclock.  ECC collected.  All specimens were labeled and sent to pathology.  Speculum removed. Pt tolerated well with minimal pain and bleeding.   Assessment & Plan:  Kelynn was seen today for colposcopy.  Diagnoses and all orders for this visit:  Encounter for annual routine gynecological examination - Cervical cancer screening: Discussed guidelines related to screening beyond 65. Based on guidelines, she should have continued screening. Pap with HPV done today along with colposcopy per pt request. *** - Breast Health: Encouraged self-breast awareness/SBE. Discussed limits of clinical breast exam for detecting breast cancer. Discussed importance of annual MXR. MXR is up to date: 01/23/23 - Colonoscopy: Per PCP - F/U 12  months and prn -     Surgical pathology( [place]/ [facility])  ASCUS with positive high risk HPV cervical Discussed colpo and pap today. Plan pending results. If persistently abnormal, has the option for LEEP to follow this or may continue with surveillance. If she has the LEEP and  then persistent abnormal, would consider hysterectomy at that point, but could also still do surveillance. Reviewed limited cervical length, so would not advise repeated LEEP procedures.  -     Surgical pathology( / [facility])   Patient was given post procedure instructions.  Will follow up pathology and manage accordingly; patient will be contacted with results and recommendations.  Routine preventative health maintenance measures emphasized.  No orders of the defined types were placed in this encounter.   Meds:  No orders of the defined types were placed in this encounter.   Follow-up: No follow-ups on file.  Milas Hock, MD 01/25/2023 3:20 PM

## 2023-01-26 ENCOUNTER — Ambulatory Visit: Payer: Medicare Other | Admitting: Obstetrics and Gynecology

## 2023-01-26 ENCOUNTER — Telehealth: Payer: Self-pay | Admitting: *Deleted

## 2023-01-26 NOTE — Telephone Encounter (Signed)
Left patient a message to call and reschedule appointment.

## 2023-02-02 ENCOUNTER — Encounter: Payer: Self-pay | Admitting: Family

## 2023-02-02 DIAGNOSIS — M47812 Spondylosis without myelopathy or radiculopathy, cervical region: Secondary | ICD-10-CM | POA: Diagnosis not present

## 2023-02-02 MED ORDER — ALPRAZOLAM 1 MG PO TABS: ORAL_TABLET | ORAL | 0 refills | Status: DC

## 2023-02-02 NOTE — Telephone Encounter (Signed)
Requesting: alprazolam 1mg  Contract: 11/17/22 UDS: 12/15/21 Last Visit: 01/10/23 Next Visit: 06/21/23 Last Refill: 01/03/23 #30 and 0RF   Please Advise

## 2023-02-03 DIAGNOSIS — M19071 Primary osteoarthritis, right ankle and foot: Secondary | ICD-10-CM | POA: Diagnosis not present

## 2023-02-09 ENCOUNTER — Ambulatory Visit: Payer: Medicare Other | Admitting: Family Medicine

## 2023-02-09 ENCOUNTER — Other Ambulatory Visit (HOSPITAL_BASED_OUTPATIENT_CLINIC_OR_DEPARTMENT_OTHER): Payer: Self-pay

## 2023-02-09 ENCOUNTER — Encounter: Payer: Self-pay | Admitting: Family Medicine

## 2023-02-09 VITALS — BP 138/63 | HR 82 | Temp 97.7°F | Ht 64.0 in | Wt 219.0 lb

## 2023-02-09 DIAGNOSIS — J0100 Acute maxillary sinusitis, unspecified: Secondary | ICD-10-CM | POA: Diagnosis not present

## 2023-02-09 MED ORDER — AMOXICILLIN-POT CLAVULANATE 875-125 MG PO TABS: 1.0000 | ORAL_TABLET | Freq: Two times a day (BID) | ORAL | 0 refills | Status: DC

## 2023-02-09 MED ORDER — AMOXICILLIN-POT CLAVULANATE 875-125 MG PO TABS
1.0000 | ORAL_TABLET | Freq: Two times a day (BID) | ORAL | 0 refills | Status: AC
  Filled 2023-02-09: qty 28, 14d supply, fill #0

## 2023-02-09 MED ORDER — FLUCONAZOLE 150 MG PO TABS
150.0000 mg | ORAL_TABLET | Freq: Once | ORAL | 0 refills | Status: AC
  Filled 2023-02-09: qty 3, 9d supply, fill #0

## 2023-02-09 NOTE — Progress Notes (Signed)
Acute Office Visit  Subjective:     Patient ID: [name] [name], female    DOB: [birth year], 67 y.o.   MRN: [id number]  Chief Complaint  Patient presents with   Sinus Problem     Patient is in today for sinus pain.   Discussed the use of AI scribe software for clinical note transcription with the patient, who gave verbal consent to proceed.  History of Present Illness   The patient, a 67 year old with a history of allergies, sinus problems, and dry mouth, presents with a two-week history of worsening sinus symptoms. Initially, she experienced allergy symptoms, which she managed with Mucinex. However, about a week ago, she developed congestion, particularly in the right sinus, and a severe headache. The headache is localized to the right side of the face, extending through the cheekbones. She also reports ear congestion and a cough due to postnasal drainage. The patient attempted to alleviate the congestion with Neosporin on a Q-tip, which resulted in the expulsion of large, green, bloody chunks from the nose. She denies any fever, body aches, chills, chest pain, or trouble breathing.  The patient's home environment has been undergoing significant construction, including the addition of a room, floor installation, and painting, which she believes may be contributing to her symptoms due to dust and mold exposure. She anticipates the construction to be completed soon.  She reports that her sinus infections typically require a two-week course of antibiotics for resolution. She requests an antibiotic and Diflucan for a potential yeast infection, a common side effect of her antibiotic treatment.              ROS All review of systems negative except what is listed in the HPI      Objective:    BP 138/63   Pulse 82   Temp 97.7 F (36.5 C) (Oral)   Ht 5\' 4"  (1.626 m)   Wt 219 lb (99.3 kg)   SpO2 97%   BMI 37.59 kg/m    Physical Exam Constitutional:      General: She is  awake. She is not in acute distress.    Appearance: Normal appearance. She is well-developed. She is not ill-appearing.  HENT:     Head: Normocephalic.     Comments: Maxillary sinuses tender to palpation     Right Ear: Tympanic membrane normal.     Left Ear: Tympanic membrane normal.     Nose: Congestion present.     Mouth/Throat:     Pharynx: Posterior oropharyngeal erythema present. No oropharyngeal exudate.  Eyes:     Conjunctiva/sclera: Conjunctivae normal.  Cardiovascular:     Rate and Rhythm: Normal rate.  Pulmonary:     Effort: Pulmonary effort is normal.     Breath sounds: Normal breath sounds.  Musculoskeletal:     Cervical back: Normal range of motion and neck supple.  Skin:    General: Skin is warm.  Neurological:     Mental Status: She is alert and oriented to person, place, and time.  Psychiatric:        Attention and Perception: Attention normal.        Mood and Affect: Mood normal.        Speech: Speech normal.        Behavior: Behavior is cooperative.     No results found for any visits on 02/09/23.      Assessment & Plan:   Problem List Items Addressed This Visit   None Visit Diagnoses  Acute non-recurrent maxillary sinusitis    -  Primary   Relevant Medications   fluconazole (DIFLUCAN) 150 MG tablet   amoxicillin-clavulanate (AUGMENTIN) 875-125 MG tablet         Sinusitis Severe sinus congestion with purulent nasal discharge and facial pain for 4-5 days. No systemic symptoms. History of recurrent sinusitis in the context of environmental allergies. -Prescribe Augmentin 875mg  twice daily for 10-14 days (history of needing extended antibiotic therapy) -Continue supportive measures including saline nasal irrigation and humidifier use. -Consider re-evaluation by allergist or ENT if recurrent sinusitis continues.  Candidiasis prophylaxis History of yeast infections with antibiotic use. -Prescribe Diflucan 150mg  every 72 hours for 3 doses.         Meds ordered this encounter  Medications   DISCONTD: amoxicillin-clavulanate (AUGMENTIN) 875-125 MG tablet    Sig: Take 1 tablet by mouth 2 (two) times daily.    Dispense:  20 tablet    Refill:  0    Supervising Provider:   Danise Edge A [[id number]]   fluconazole (DIFLUCAN) 150 MG tablet    Sig: Take 1 tablet (150 mg total) by mouth once for 1 dose. Repeat after 3 days if not improved.    Dispense:  3 tablet    Refill:  0    Supervising Provider:   Danise Edge A [[id number]]   amoxicillin-clavulanate (AUGMENTIN) 875-125 MG tablet    Sig: Take 1 tablet by mouth 2 (two) times daily for 14 days.    Dispense:  28 tablet    Refill:  0    Supervising Provider:   Danise Edge A [[id number]]    Return if symptoms worsen or fail to improve.  Clayborne Dana, NP
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
## 2023-02-23 ENCOUNTER — Other Ambulatory Visit (HOSPITAL_COMMUNITY)
Admission: RE | Admit: 2023-02-23 | Discharge: 2023-02-23 | Disposition: A | Discharge status: Home / Self Care | Payer: Medicare Other | Source: Ambulatory Visit | Attending: Obstetrics and Gynecology | Admitting: Obstetrics and Gynecology

## 2023-02-23 ENCOUNTER — Ambulatory Visit (INDEPENDENT_AMBULATORY_CARE_PROVIDER_SITE_OTHER): Payer: Medicare Other | Admitting: Obstetrics and Gynecology

## 2023-02-23 ENCOUNTER — Encounter: Payer: Self-pay | Admitting: Obstetrics and Gynecology

## 2023-02-23 VITALS — BP 138/83 | HR 76 | Wt 218.0 lb

## 2023-02-23 DIAGNOSIS — Z01419 Encounter for gynecological examination (general) (routine) without abnormal findings: Secondary | ICD-10-CM | POA: Diagnosis not present

## 2023-02-23 DIAGNOSIS — R87810 Cervical high risk human papillomavirus (HPV) DNA test positive: Secondary | ICD-10-CM | POA: Diagnosis present

## 2023-02-23 DIAGNOSIS — R87610 Atypical squamous cells of undetermined significance on cytologic smear of cervix (ASC-US): Secondary | ICD-10-CM

## 2023-02-23 DIAGNOSIS — Z780 Asymptomatic menopausal state: Secondary | ICD-10-CM | POA: Diagnosis not present

## 2023-02-23 NOTE — Addendum Note (Signed)
 Addended by: Milas Hock A on: 02/23/2023 11:59 AM   Modules accepted: Orders

## 2023-02-27 LAB — SURGICAL PATHOLOGY

## 2023-03-01 LAB — CYTOLOGY - PAP
Comment: NEGATIVE
Diagnosis: UNDETERMINED — AB
High risk HPV: NEGATIVE

## 2023-03-03 ENCOUNTER — Encounter: Payer: Self-pay | Admitting: Family

## 2023-03-03 MED ORDER — ALPRAZOLAM 1 MG PO TABS: ORAL_TABLET | ORAL | 0 refills | Status: DC

## 2023-03-03 NOTE — Telephone Encounter (Signed)
Requesting:alprazolam 1 Contract: 11/17/22 UDS: 12/15/21 Last Visit: 01/10/23 Next Visit: 06/21/23 Last Refill: 02/02/23 #30 and 0RF   Please Advise

## 2023-03-07 DIAGNOSIS — M47812 Spondylosis without myelopathy or radiculopathy, cervical region: Secondary | ICD-10-CM | POA: Diagnosis not present

## 2023-03-10 ENCOUNTER — Other Ambulatory Visit: Payer: Self-pay | Admitting: Family

## 2023-03-23 ENCOUNTER — Encounter: Payer: Self-pay | Admitting: Family

## 2023-03-23 NOTE — Telephone Encounter (Signed)
 [facility] is not coming up in our system. Patient was advised to get some more information. Maybe we can fax a printed prescription if she is able to get a fax phone number

## 2023-03-27 ENCOUNTER — Other Ambulatory Visit: Payer: Self-pay

## 2023-03-27 MED ORDER — VALACYCLOVIR HCL 500 MG PO TABS: 500.0000 mg | ORAL_TABLET | Freq: Every day | ORAL | 1 refills | Status: DC | PRN

## 2023-03-27 NOTE — Telephone Encounter (Signed)
 Rx sent for 90 day supply with one refill. Patient made aware

## 2023-04-01 ENCOUNTER — Other Ambulatory Visit: Payer: Self-pay | Admitting: Family

## 2023-04-02 ENCOUNTER — Encounter: Payer: Self-pay | Admitting: Family

## 2023-04-03 MED ORDER — ALPRAZOLAM 1 MG PO TABS
ORAL_TABLET | ORAL | 0 refills | Status: DC
Start: 2023-04-03 — End: 2023-05-02

## 2023-04-03 NOTE — Addendum Note (Signed)
Addended by: Sandford Craze on: 04/03/2023 10:45 AM   Modules accepted: Orders

## 2023-04-07 DIAGNOSIS — M47812 Spondylosis without myelopathy or radiculopathy, cervical region: Secondary | ICD-10-CM | POA: Diagnosis not present

## 2023-04-21 ENCOUNTER — Ambulatory Visit: Payer: Medicare Other | Admitting: Family

## 2023-04-21 ENCOUNTER — Encounter: Payer: Self-pay | Admitting: Family

## 2023-04-24 DIAGNOSIS — M47812 Spondylosis without myelopathy or radiculopathy, cervical region: Secondary | ICD-10-CM | POA: Diagnosis not present

## 2023-04-24 DIAGNOSIS — M79671 Pain in right foot: Secondary | ICD-10-CM | POA: Diagnosis not present

## 2023-04-26 ENCOUNTER — Other Ambulatory Visit: Payer: Self-pay | Admitting: Family

## 2023-05-02 ENCOUNTER — Encounter: Payer: Self-pay | Admitting: Family

## 2023-05-02 MED ORDER — ALPRAZOLAM 1 MG PO TABS: ORAL_TABLET | ORAL | 0 refills | Status: DC

## 2023-05-08 ENCOUNTER — Encounter: Payer: Self-pay | Admitting: Family

## 2023-05-09 DIAGNOSIS — G5681 Other specified mononeuropathies of right upper limb: Secondary | ICD-10-CM | POA: Diagnosis not present

## 2023-05-09 DIAGNOSIS — G8929 Other chronic pain: Secondary | ICD-10-CM | POA: Diagnosis not present

## 2023-05-09 DIAGNOSIS — M25511 Pain in right shoulder: Secondary | ICD-10-CM | POA: Diagnosis not present

## 2023-05-12 ENCOUNTER — Other Ambulatory Visit (HOSPITAL_BASED_OUTPATIENT_CLINIC_OR_DEPARTMENT_OTHER): Payer: Self-pay

## 2023-05-12 ENCOUNTER — Other Ambulatory Visit (HOSPITAL_COMMUNITY)
Admission: RE | Admit: 2023-05-12 | Discharge: 2023-05-12 | Disposition: A | Discharge status: Home / Self Care | Source: Ambulatory Visit | Attending: Family | Admitting: Family

## 2023-05-12 ENCOUNTER — Ambulatory Visit (INDEPENDENT_AMBULATORY_CARE_PROVIDER_SITE_OTHER): Admitting: Family

## 2023-05-12 VITALS — BP 138/88 | HR 89 | Temp 98.1°F | Resp 16 | Ht 64.0 in | Wt 222.0 lb

## 2023-05-12 DIAGNOSIS — N760 Acute vaginitis: Secondary | ICD-10-CM

## 2023-05-12 DIAGNOSIS — R350 Frequency of micturition: Secondary | ICD-10-CM | POA: Insufficient documentation

## 2023-05-12 LAB — POC URINALSYSI DIPSTICK (AUTOMATED)
Bilirubin, UA: NEGATIVE
Glucose, UA: NEGATIVE
Ketones, UA: NEGATIVE
Leukocytes, UA: NEGATIVE
Nitrite, UA: NEGATIVE
Protein, UA: NEGATIVE
Spec Grav, UA: <=1.005 — AB (ref 1.010–1.025)
Urobilinogen, UA: 0.2 U/dL
pH, UA: 6 (ref 5.0–8.0)

## 2023-05-12 MED ORDER — FLUCONAZOLE 150 MG PO TABS
ORAL_TABLET | ORAL | 0 refills | Status: DC
  Filled 2023-05-12: qty 2, 4d supply, fill #0

## 2023-05-12 MED ORDER — SULFAMETHOXAZOLE-TRIMETHOPRIM 800-160 MG PO TABS
1.0000 | ORAL_TABLET | Freq: Two times a day (BID) | ORAL | 0 refills | Status: DC
  Filled 2023-05-12: qty 10, 5d supply, fill #0

## 2023-05-12 MED ORDER — ZEPBOUND 2.5 MG/0.5ML ~~LOC~~ SOAJ
2.5000 mg | SUBCUTANEOUS | 0 refills | Status: DC
  Filled 2023-05-12: qty 2, 28d supply, fill #0

## 2023-05-12 NOTE — Assessment & Plan Note (Signed)
 UA + blood. Symptomatic. Will begin empiric bactrim while we wait for culture results.

## 2023-05-12 NOTE — Assessment & Plan Note (Signed)
  BMI qualifies for medication. Zepbound covered by insurance, chosen for coverage and effectiveness. - Prescribe Zepbound 2.5 mg weekly for weight loss. - Send prescription to pharmacy for prior authorization. - Schedule follow-up appointment after one month to assess tolerance and effectiveness.

## 2023-05-12 NOTE — Progress Notes (Addendum)
 Established Patient Office Visit  Subjective   Patient ID: [name] [name], female    DOB: 1955-06-05  Age: 68 y.o. MRN: [id number]  Chief Complaint  Patient presents with   Hypertension    Here for follow up   Vaginal Itching    Complains of vaginal itching and odor   Urinary Frequency    Complains of urinary frequency and urgency    Pleasant 68 yo patient presents with 2 week history of dysuria, urinary frequency, and incontinence. She also reports vaginal itching and foul odor. States there is no concern for STI. Patient reports that she has had frequent UTIs and yeast infections "for years."   The patient states she had weight loss surgery in the early 2000s but never felt like she achieved her goal weight. She is concerned about her weight and how it is negatively impacting her ability to take care of daily activities. She reports increasing right ankle pain that she attributes to her weight. She is requesting medication management for weight loss.   Hypertension  Vaginal Itching Associated symptoms include dysuria and frequency. Pertinent negatives include no chills, fever or rash.  Urinary Frequency  Associated symptoms include frequency. Pertinent negatives include no chills.    Review of Systems  Constitutional:  Negative for chills and fever.  Genitourinary:  Positive for dysuria and frequency.  Skin:  Negative for rash.     Objective:     BP (!) 146/66 (BP Location: Right Arm, Patient Position: Sitting, Cuff Size: Large)   Pulse 89   Temp 98.1 F (36.7 C) (Oral)   Resp 16   Ht 5\' 4"  (1.626 m)   Wt 222 lb (100.7 kg)   SpO2 98%   BMI 38.11 kg/m    Physical Exam Vitals reviewed.  Constitutional:      Appearance: Normal appearance.  HENT:     Head: Normocephalic and atraumatic.     Nose: No congestion or rhinorrhea.  Cardiovascular:     Rate and Rhythm: Normal rate and regular rhythm.     Pulses: Normal pulses.     Heart sounds: Normal heart  sounds.  Pulmonary:     Effort: Pulmonary effort is normal.     Breath sounds: Normal breath sounds.  Abdominal:     General: Bowel sounds are normal.     Palpations: Abdomen is soft.  Genitourinary:    General: Normal vulva.  Skin:    General: Skin is warm and dry.     Capillary Refill: Capillary refill takes less than 2 seconds.  Neurological:     Mental Status: She is alert and oriented to person, place, and time.  Psychiatric:        Mood and Affect: Mood normal.        Behavior: Behavior normal.      Assessment & Plan:   Problem List Items Addressed This Visit   None Visit Diagnoses       Urinary frequency    -  Primary   Relevant Orders   POCT Urinalysis Dipstick (Automated)      -Urinary frequency - 2 weeks history of urinary frequency, dysuria, and incontinence. Microscopic blood on POC UA. Urine sent for C&S. Will start on Bactrim until we have culture results.   -Acute vaginitis - swab for BV and yeast sent to lab. Will start on Diflucan.   -Obesity - will start Zepbound. Patient will return in one month for follow-up.    Cristopher Peru, RN

## 2023-05-12 NOTE — Assessment & Plan Note (Signed)
 Swab will be sent for BV and Yeast.  Will plan empiric rx for yeast with diflucan over the weekend while we wait for swab results.

## 2023-05-12 NOTE — Patient Instructions (Signed)
 VISIT SUMMARY:  During today's visit, we addressed your recurrent urinary tract infections and yeast infections, discussed weight loss options, and reviewed your arthritis and hypertension concerns. We have made adjustments to your medications and provided a plan to manage your symptoms and improve your overall health.  YOUR PLAN:  -URINARY TRACT INFECTION (UTI): A urinary tract infection is an infection in any part of your urinary system. We have prescribed Bactrim, as you have tolerated it well in the past. We will also send your urine for a culture to ensure the treatment is effective.  -YEAST INFECTION: A yeast infection is a fungal infection that causes itching and discomfort. Since antibiotics for your UTI can worsen this condition, we have prescribed Diflucan. Take one dose initially, a second dose three days later if symptoms persist, and keep the third dose for future use if symptoms recur.  -OBESITY: Obesity is a condition where excess body fat negatively affects your health. We have prescribed Zepbound 2.5 mg weekly to help with weight loss. This medication is covered by your insurance. We will follow up in one month to assess how you are tolerating the medication and its effectiveness.  -ARTHRITIS: Arthritis is inflammation of the joints, which can cause pain and stiffness. Weight loss is encouraged to reduce the pressure on your joints. Continue using your medical shoes for support.  INSTRUCTIONS:  Please follow up in one month to assess your response to the weight loss medication. Continue to monitor your blood pressure regularly and keep track of any symptoms. If your symptoms worsen or you have any concerns, contact our office.

## 2023-05-12 NOTE — Progress Notes (Signed)
 Subjective:     Patient ID: [name] [name], female    DOB: 08-29-1955, 68 y.o.   MRN: [id number]  Chief Complaint  Patient presents with   Hypertension    Here for follow up   Vaginal Itching    Complains of vaginal itching and odor   Urinary Frequency    Complains of urinary frequency and urgency    Hypertension  Vaginal Itching Associated symptoms include frequency.  Urinary Frequency  Associated symptoms include frequency.    Discussed the use of AI scribe software for clinical note transcription with the patient, who gave verbal consent to proceed.  History of Present Illness  [name] [name] is a 68 year old female with recurrent yeast infections and urinary tract infections who presents with symptoms of both conditions.  She experiences symptoms consistent with a urinary tract infection, including hematuria. She has a history of intolerance to certain antibiotics, such as diarrhea with Keflex and an allergy to Macrobid. She has previously tolerated Bactrim and Cipro without issues.  She has a long-standing history of recurrent yeast infections, dating back to her teenage years. She experiences itching and discomfort, which she anticipates will worsen with antibiotic treatment. She has previously used Diflucan effectively and requests a set of three doses to manage her symptoms.  She is interested in weight loss medication due to her family history of heart conditions and her own history of high blood pressure. Her current weight fluctuates between 227 and 212 pounds, and she is concerned about the impact of her weight on her arthritic foot, which causes significant pain and requires her to wear medical shoes.  Her family history includes her father, who had high blood pressure and diabetes, and her mother, who has high blood pressure, high cholesterol, and high triglycerides. She is the primary caregiver for her granddaughter due to her daughter's mental health  issues.     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 Allena Katz Retinal specialist in [place]   Maxillary sinusitis 07/24/2020   Neuromuscular disorder (HCC)    rt arm carpal tunnel syndrome   Obesity    status post bariatric procedure in [place] 2005   Osteoporosis    Osteoporosis 08/21/2008   Sarcoidosis of lung (HCC)    Thyroid nodule 08/2008   `   Thyroid nodule    Varicose vein of leg     Past Surgical History:  Procedure Laterality Date   ANTERIOR CERVICAL DECOMP/DISCECTOMY FUSION N/A 06/21/2012   Procedure: ANTERIOR CERVICAL DECOMPRESSION/DISCECTOMY FUSION C-4 - C5 (SPACER/DePUY CERVICAL PLATE ONLY) 1 LEVEL;  Surgeon: Venita Lick, MD;  Location: [facility] OR;  Service: Orthopedics;  Laterality: N/A;   BLADDER SURGERY  02/21/12 and 02/28/12   Dr Retta Diones   COLPOSCOPY  1980   CRYOTHERAPY  1980   CYSTO WITH HYDRODISTENSION  12/12/2011   Procedure: CYSTOSCOPY/HYDRODISTENSION;  Surgeon: Marcine Matar, MD;  Location: [facility];  Service: Urology;  Laterality: N/A;  30 MIN    EYE SURGERY  1997   bilateral cataract extraction   FOOT SURGERY     bilateral bunionettes   FOOT SURGERY Right 01/07/2014   Pt states surgery was due to arthritis. "Has pins in place now"   FOREIGN BODY REMOVAL  Right 09/27/2019   Procedure: REMOVAL FOREIGN BOD RIGHT FOOT;  Surgeon: Beverely Low, MD;  Location: [facility];  Service: Orthopedics;  Laterality: Right;   FRACTURE SURGERY Left 2019   wrist   GASTRIC BYPASS  2005   JOINT REPLACEMENT  2008   left total knee   KNEE SURGERY  1997   left knee   LYMPH NODE DISSECTION Right 11/09/2018   Procedure: EXCISE DEEP RIGHT CERVICAL LYMPH NODE;  Surgeon: Claud Kelp, MD;  Location: [facility] OR;  Service: General;  Laterality: Right;   NASAL SINUS SURGERY  1989   Dr Lazarus Salines   ORIF DISTAL RADIUS FRACTURE  03/2022   REFRACTIVE SURGERY  2006   lasik   REVERSE SHOULDER ARTHROPLASTY Left 09/27/2019   Procedure: REVERSE SHOULDER ARTHROPLASTY;  Surgeon: Beverely Low, MD;  Location: [facility];  Service: Orthopedics;  Laterality: Left;  2 hrs General with intrascalene block   SHOULDER SURGERY  03/2010   RIGHT    STOMACH SURGERY  2007   "tummy tuck"   TOTAL KNEE ARTHROPLASTY Right 12/31/2015   Procedure: RIGHT TOTAL KNEE ARTHROPLASTY;  Surgeon: Jene Every, MD;  Location: [facility];  Service: Orthopedics;  Laterality: Right;  Adductor Block   VARICOSE VEIN SURGERY Right 02/2012   leg    Family History  Problem Relation Age of Onset   Hyperlipidemia Mother    Hypertension Mother    Arthritis Mother    Hypertension Father    Diabetes Father    Prostate cancer Father    Dementia Father    Lung cancer Father    Arthritis Brother    Testicular cancer Brother    Heart disease Maternal Grandmother    Hypertension Maternal Grandmother    Heart disease Maternal Grandfather    Hypertension Maternal Grandfather    Stroke Maternal Grandfather    Diabetes Paternal Grandfather    Stroke Paternal Grandfather    Colon cancer Neg Hx    Esophageal cancer Neg Hx    Pancreatic cancer Neg Hx    Stomach cancer Neg Hx     Social History   Socioeconomic History   Marital status: Married    Spouse name: Not on file   Number of children: 1   Years of education:  Not on file   Highest education level: Not on file  Occupational History   Occupation: Event organiser: [employer]  Tobacco Use   Smoking status: Never   Smokeless tobacco: Never  Vaping Use   Vaping status: Never Used  Substance and Sexual Activity   Alcohol use: Not Currently    Comment: RARE   Drug use: No   Sexual activity: Yes    Comment: 1st intercourse 68 yo-More than 5 partners  Other Topics Concern   Not on file  Social History Narrative   Married    daughter (Bipolar/schizophrenia)   granddaughter   Retired at age 38  Social Drivers of Corporate investment banker Strain: Not on file  Food Insecurity: No Food Insecurity (06/30/2020)   Received from [facility], [facility]   Hunger Vital Sign    Worried About Running Out of Food in the Last Year: Never true    Ran Out of Food in the Last Year: Never true  Transportation Needs: Not on file  Physical Activity: Not on file  Stress: Not on file  Social Connections: Unknown (06/17/2021)   Received from [facility], [facility]   Social Network    Social Network: Not on file  Intimate Partner Violence: Unknown (05/21/2021)   [facility], [facility]   HITS    Physically Hurt: Not on file    Insult or Talk Down To: Not on file    Threaten Physical Harm: Not on file    Scream or Curse: Not on file    Outpatient Medications Prior to Visit  Medication Sig Dispense Refill   acetaminophen (TYLENOL) 650 MG CR tablet Take 2 tablets (1,300 mg total) by mouth every 8 (eight) hours as needed for pain. Take for mild pain. Do not combine with Percocet. Do not exceed daily recommended limit of Tylenol.     albuterol (VENTOLIN HFA) 108 (90 Base) MCG/ACT inhaler Inhale 2 puffs into the lungs every 6 (six) hours as needed for wheezing or shortness of breath. 6.7 g 0   ALPRAZolam (XANAX) 1 MG tablet TAKE 1 TABLET (1 MG TOTAL) BY MOUTH 2 (TWO) TIMES DAILY AS NEEDED FOR ANXIETY AT BEDTIME AS  NEEDED 30 tablet 0   Ascorbic Acid (VITAMIN C) 1000 MG tablet Take 1,000 mg by mouth at bedtime.     busPIRone (BUSPAR) 15 MG tablet TAKE 1 TABLET BY MOUTH TWICE  DAILY 200 tablet 0   Calcium Carb-Cholecalciferol (CALTRATE 600+D3 PO) Take 1 tablet by mouth in the morning and at bedtime.     Cholecalciferol (VITAMIN D3) 75 MCG (3000 UT) TABS Take 1 tablet by mouth daily. 30 tablet    COVID-19 mRNA vaccine 2023-2024 (COMIRNATY) syringe Inject into the muscle. 0.3 mL 0   cyanocobalamin (VITAMIN B12) 1000 MCG tablet Take 1 tablet (1,000 mcg total) by mouth daily.     diclofenac Sodium (VOLTAREN) 1 % GEL APPLY 2 G TOPICALLY 2 (TWO) TIMES DAILY AS NEEDED. 100 g 3   ferrous sulfate 325 (65 FE) MG tablet Take 325 mg by mouth in the morning and at bedtime.     fexofenadine (ALLEGRA) 180 MG tablet Take 180 mg by mouth at bedtime.     fluticasone (FLONASE) 50 MCG/ACT nasal spray Place 2 sprays into both nostrils daily. 48 mL 1   Multiple Vitamin (MULTIVITAMIN WITH MINERALS) TABS tablet Take 1 tablet by mouth at bedtime.     Multiple Vitamins-Minerals (PRESERVISION AREDS 2) CAPS Take 1 tablet by mouth daily.     MYRBETRIQ 50 MG TB24 tablet Take 50 mg by mouth at bedtime.   2   pantoprazole (PROTONIX) 40 MG tablet TAKE 1 TABLET BY MOUTH DAILY 90 tablet 0   PARoxetine (PAXIL) 40 MG tablet Take 1 tablet (40 mg total) by mouth every morning. 100 tablet 1   pilocarpine (SALAGEN) 5 MG tablet Take 1 tablet (5 mg total) by mouth in the morning, at noon, and at bedtime. 270 tablet 1   sodium chloride (OCEAN) 0.65 % SOLN nasal spray Place 1-2 sprays into both nostrils 4 (four) times daily as needed for congestion.  Sodium Fluoride (SODIUM FLUORIDE 5000 PPM) 1.1 % PSTE Use as directed. 100 mL 2   valACYclovir (VALTREX) 500 MG tablet Take 1 tablet (500 mg total) by mouth daily as needed. 90 tablet 1   Vitamin D, Ergocalciferol, (DRISDOL) 1.25 MG (50000 UNIT) CAPS capsule TAKE 1 CAPSULE (50,000 UNITS TOTAL) BY MOUTH  EVERY 7 (SEVEN) DAYS 12 capsule 1   zinc gluconate 50 MG tablet Take 50 mg by mouth at bedtime.      No facility-administered medications prior to visit.    Allergies  Allergen Reactions   Keflex [Cephalexin] Diarrhea   Nitrofurantoin Other (See Comments)    REACTION: fever   Other Other (See Comments)   Ibuprofen Nausea Only    REACTION: Post gastric bypass-->can not take due to surgery.  No allergy. REACTION: Post gastric bypass-->can not take due to surgery.  No allergy.    Review of Systems  Genitourinary:  Positive for frequency.   See HPI    Objective:    Physical Exam Exam conducted with a chaperone present.  Constitutional:      General: She is not in acute distress.    Appearance: Normal appearance. She is well-developed.  HENT:     Head: Normocephalic and atraumatic.     Right Ear: External ear normal.     Left Ear: External ear normal.  Eyes:     General: No scleral icterus. Neck:     Thyroid: No thyromegaly.  Cardiovascular:     Rate and Rhythm: Normal rate and regular rhythm.     Heart sounds: Normal heart sounds. No murmur heard. Pulmonary:     Effort: Pulmonary effort is normal. No respiratory distress.     Breath sounds: Normal breath sounds. No wheezing.  Genitourinary:    General: Normal vulva.     Labia:        Right: No rash.        Left: No rash.   Musculoskeletal:     Cervical back: Neck supple.  Skin:    General: Skin is warm and dry.  Neurological:     Mental Status: She is alert and oriented to person, place, and time.  Psychiatric:        Mood and Affect: Mood normal.        Behavior: Behavior normal.        Thought Content: Thought content normal.        Judgment: Judgment normal.      BP 138/88   Pulse 89   Temp 98.1 F (36.7 C) (Oral)   Resp 16   Ht 5\' 4"  (1.626 m)   Wt 222 lb (100.7 kg)   SpO2 98%   BMI 38.11 kg/m  Wt Readings from Last 3 Encounters:  05/12/23 222 lb (100.7 kg)  02/23/23 218 lb (98.9 kg)   02/09/23 219 lb (99.3 kg)       Assessment & Plan:   Problem List Items Addressed This Visit       Unprioritized   Urinary frequency - Primary   UA + blood. Symptomatic. Will begin empiric bactrim while we wait for culture results.       Relevant Medications   sulfamethoxazole-trimethoprim (BACTRIM DS) 800-160 MG tablet   Other Relevant Orders   POCT Urinalysis Dipstick (Automated) (Completed)   Urine Culture   MORBID OBESITY    BMI qualifies for medication. Zepbound covered by insurance, chosen for coverage and effectiveness. - Prescribe Zepbound 2.5 mg weekly for weight loss. - Send  prescription to pharmacy for prior authorization. - Schedule follow-up appointment after one month to assess tolerance and effectiveness.      Relevant Medications   tirzepatide (ZEPBOUND) 2.5 MG/0.5ML Pen   Acute vaginitis   Swab will be sent for BV and Yeast.  Will plan empiric rx for yeast with diflucan over the weekend while we wait for swab results.       Relevant Medications   fluconazole (DIFLUCAN) 150 MG tablet   Other Relevant Orders   Cervicovaginal ancillary only( [facility])    I am having Shulamit T. Shipes start on sulfamethoxazole-trimethoprim, fluconazole, and Zepbound. I am also having her maintain her Myrbetriq, acetaminophen, zinc gluconate, multivitamin with minerals, vitamin C, ferrous sulfate, fexofenadine, Calcium Carb-Cholecalciferol (CALTRATE 600+D3 PO), sodium chloride, Vitamin D3, PreserVision AREDS 2, Comirnaty, diclofenac Sodium, Sodium Fluoride 5000 PPM, albuterol, PARoxetine, cyanocobalamin, Vitamin D (Ergocalciferol), pilocarpine, busPIRone, valACYclovir, fluticasone, pantoprazole, and ALPRAZolam.  Meds ordered this encounter  Medications   sulfamethoxazole-trimethoprim (BACTRIM DS) 800-160 MG tablet    Sig: Take 1 tablet by mouth 2 (two) times daily.    Dispense:  10 tablet    Refill:  0    Supervising Provider:   Danise Edge A [[id number]]   fluconazole  (DIFLUCAN) 150 MG tablet    Sig: Take 1 tablet by mouth today, then may repeat in 3 days if symptoms persist.    Dispense:  2 tablet    Refill:  0    Supervising Provider:   Danise Edge A [[id number]]   tirzepatide (ZEPBOUND) 2.5 MG/0.5ML Pen    Sig: Inject 2.5 mg into the skin once a week.    Dispense:  2 mL    Refill:  0    Supervising Provider:   Danise Edge A [[id number]]

## 2023-05-13 LAB — URINE CULTURE
MICRO NUMBER:: 16261093
Result:: NO GROWTH
SPECIMEN QUALITY:: ADEQUATE

## 2023-05-15 ENCOUNTER — Telehealth

## 2023-05-15 ENCOUNTER — Other Ambulatory Visit (HOSPITAL_BASED_OUTPATIENT_CLINIC_OR_DEPARTMENT_OTHER): Payer: Self-pay

## 2023-05-15 ENCOUNTER — Other Ambulatory Visit (HOSPITAL_COMMUNITY): Payer: Self-pay

## 2023-05-15 ENCOUNTER — Encounter: Payer: Self-pay | Admitting: Family

## 2023-05-15 LAB — CERVICOVAGINAL ANCILLARY ONLY
Bacterial Vaginitis (gardnerella): NEGATIVE
Candida Glabrata: NEGATIVE
Candida Vaginitis: NEGATIVE
Comment: NEGATIVE
Comment: NEGATIVE
Comment: NEGATIVE

## 2023-05-15 NOTE — Telephone Encounter (Signed)
 Pharmacy Patient Advocate Encounter   Received notification from Onbase that prior authorization for Zepbound 2.5MG /0.5ML pen-injectors is required/requested.   Insurance verification completed.   The patient is insured through [place] .   Per test claim: PA required; PA submitted to above mentioned insurance via CoverMyMeds Key/confirmation #/EOC [id number] Status is pending

## 2023-05-15 NOTE — Telephone Encounter (Signed)
 Pharmacy Patient Advocate Encounter  Received notification from [facility] that Prior Authorization for Zepbound 2.5MG /0.5ML pen-injectors has been DENIED.  See denial reason below. No denial letter attached in CMM. Will attach denial letter to Media tab once received.   PA #/Case ID/Reference #: [id number]

## 2023-05-16 ENCOUNTER — Encounter: Payer: Self-pay | Admitting: Family

## 2023-05-16 ENCOUNTER — Other Ambulatory Visit (HOSPITAL_BASED_OUTPATIENT_CLINIC_OR_DEPARTMENT_OTHER): Payer: Self-pay

## 2023-05-16 DIAGNOSIS — K5730 Diverticulosis of large intestine without perforation or abscess without bleeding: Secondary | ICD-10-CM | POA: Diagnosis not present

## 2023-05-16 DIAGNOSIS — R109 Unspecified abdominal pain: Secondary | ICD-10-CM | POA: Diagnosis not present

## 2023-05-16 DIAGNOSIS — R310 Gross hematuria: Secondary | ICD-10-CM | POA: Diagnosis not present

## 2023-05-16 DIAGNOSIS — K5792 Diverticulitis of intestine, part unspecified, without perforation or abscess without bleeding: Secondary | ICD-10-CM | POA: Diagnosis not present

## 2023-05-16 DIAGNOSIS — N132 Hydronephrosis with renal and ureteral calculous obstruction: Secondary | ICD-10-CM | POA: Diagnosis not present

## 2023-05-16 DIAGNOSIS — R1084 Generalized abdominal pain: Secondary | ICD-10-CM | POA: Diagnosis not present

## 2023-05-17 ENCOUNTER — Other Ambulatory Visit: Payer: Self-pay | Admitting: Family

## 2023-05-17 ENCOUNTER — Other Ambulatory Visit (HOSPITAL_BASED_OUTPATIENT_CLINIC_OR_DEPARTMENT_OTHER): Payer: Self-pay

## 2023-05-17 ENCOUNTER — Other Ambulatory Visit: Payer: Self-pay | Admitting: Urology

## 2023-05-18 ENCOUNTER — Other Ambulatory Visit (HOSPITAL_BASED_OUTPATIENT_CLINIC_OR_DEPARTMENT_OTHER): Payer: Self-pay

## 2023-05-19 ENCOUNTER — Other Ambulatory Visit (HOSPITAL_BASED_OUTPATIENT_CLINIC_OR_DEPARTMENT_OTHER): Payer: Self-pay

## 2023-05-22 ENCOUNTER — Other Ambulatory Visit (HOSPITAL_BASED_OUTPATIENT_CLINIC_OR_DEPARTMENT_OTHER): Payer: Self-pay

## 2023-05-23 ENCOUNTER — Other Ambulatory Visit (HOSPITAL_BASED_OUTPATIENT_CLINIC_OR_DEPARTMENT_OTHER): Payer: Self-pay

## 2023-05-23 NOTE — Patient Instructions (Signed)
 DUE TO COVID-19 ONLY TWO VISITORS  (aged 67 and older)  ARE ALLOWED TO COME WITH YOU AND STAY IN THE WAITING ROOM ONLY DURING PRE OP AND PROCEDURE.   **NO VISITORS ARE ALLOWED IN THE SHORT STAY AREA OR RECOVERY ROOM!!**  IF YOU WILL BE ADMITTED INTO THE HOSPITAL YOU ARE ALLOWED ONLY FOUR SUPPORT PEOPLE DURING VISITATION HOURS ONLY (7 AM -8PM)   The support person(s) must pass our screening, gel in and out, and wear a mask at all times, including in the patient's room. Patients must also wear a mask when staff or their support person are in the room. Visitors GUEST BADGE MUST BE WORN VISIBLY  One adult visitor may remain with you overnight and MUST be in the room by 8 P.M.     Your procedure is scheduled on: 06/08/23   Report to [facility] Main Entrance    Report to admitting at : 5:15 AM   Call this number if you have problems the morning of surgery [phone]   Do not eat food or drink: After Midnight.  FOLLOW ANY ADDITIONAL PRE OP INSTRUCTIONS YOU RECEIVED FROM YOUR SURGEON'S OFFICE!!!   Oral Hygiene is also important to reduce your risk of infection.                                    Remember - BRUSH YOUR TEETH THE MORNING OF SURGERY WITH YOUR REGULAR TOOTHPASTE  DENTURES WILL BE REMOVED PRIOR TO SURGERY PLEASE DO NOT APPLY "Poly grip" OR ADHESIVES!!!   Do NOT smoke after Midnight   Take these medicines the morning of surgery with A SIP OF WATER: buspirone,paroxetine,tamsulosin,pilocarpine,pantoprazole.Tylenol,valtrex as needed.Flonase as usual.  STOP TAKING all Vitamins, Herbs and supplements 1 week before your surgery.                               You may not have any metal on your body including hair pins, jewelry, and body piercing             Do not wear make-up, lotions, powders, perfumes/cologne, or deodorant  Do not wear nail polish including gel and S&S, artificial/acrylic nails, or any other type of covering on natural nails including finger and  toenails. If you have artificial nails, gel coating, etc. that needs to be removed by a nail salon please have this removed prior to surgery or surgery may need to be canceled/ delayed if the surgeon/ anesthesia feels like they are unable to be safely monitored.   Do not shave  48 hours prior to surgery.    Do not bring valuables to the hospital. [facility] IS NOT             RESPONSIBLE   FOR VALUABLES.   Contacts, glasses, or bridgework may not be worn into surgery.   Bring small overnight bag day of surgery.   DO NOT BRING YOUR HOME MEDICATIONS TO THE HOSPITAL. PHARMACY WILL DISPENSE MEDICATIONS LISTED ON YOUR MEDICATION LIST TO YOU DURING YOUR ADMISSION IN THE HOSPITAL!    Patients discharged on the day of surgery will not be allowed to drive home.  Someone NEEDS to stay with you for the first 24 hours after anesthesia.   Special Instructions: Bring a copy of your healthcare power of attorney and living will documents  the day of surgery if you haven't scanned them before.              Please read over the following fact sheets you were given: IF YOU HAVE QUESTIONS ABOUT YOUR PRE-OP INSTRUCTIONS PLEASE CALL [phone]    [facility] - Preparing for Surgery Before surgery, you can play an important role.  Because skin is not sterile, your skin needs to be as free of germs as possible.  You can reduce the number of germs on your skin by washing with CHG (chlorahexidine gluconate) soap before surgery.  CHG is an antiseptic cleaner which kills germs and bonds with the skin to continue killing germs even after washing. Please DO NOT use if you have an allergy to CHG or antibacterial soaps.  If your skin becomes reddened/irritated stop using the CHG and inform your nurse when you arrive at Short Stay. Do not shave (including legs and underarms) for at least 48 hours prior to the first CHG shower.  You may shave your face/neck. Please follow these instructions carefully:  1.  Shower  with CHG Soap the night before surgery and the  morning of Surgery.  2.  If you choose to wash your hair, wash your hair first as usual with your  normal  shampoo.  3.  After you shampoo, rinse your hair and body thoroughly to remove the  shampoo.                           4.  Use CHG as you would any other liquid soap.  You can apply chg directly  to the skin and wash                       Gently with a scrungie or clean washcloth.  5.  Apply the CHG Soap to your body ONLY FROM THE NECK DOWN.   Do not use on face/ open                           Wound or open sores. Avoid contact with eyes, ears mouth and genitals (private parts).                       Wash face,  Genitals (private parts) with your normal soap.             6.  Wash thoroughly, paying special attention to the area where your surgery  will be performed.  7.  Thoroughly rinse your body with warm water from the neck down.  8.  DO NOT shower/wash with your normal soap after using and rinsing off  the CHG Soap.                9.  Pat yourself dry with a clean towel.            10.  Wear clean pajamas.            11.  Place clean sheets on your bed the night of your first shower and do not  sleep with pets. Day of Surgery : Do not apply any lotions/deodorants the morning of surgery.  Please wear clean clothes to the hospital/surgery center.  FAILURE TO FOLLOW THESE INSTRUCTIONS MAY RESULT IN THE CANCELLATION OF YOUR SURGERY PATIENT SIGNATURE_________________________________  NURSE SIGNATURE__________________________________  ________________________________________________________________________

## 2023-05-24 ENCOUNTER — Other Ambulatory Visit: Payer: Self-pay

## 2023-05-24 ENCOUNTER — Other Ambulatory Visit (HOSPITAL_BASED_OUTPATIENT_CLINIC_OR_DEPARTMENT_OTHER): Payer: Self-pay

## 2023-05-24 ENCOUNTER — Encounter (HOSPITAL_COMMUNITY)
Admission: RE | Admit: 2023-05-24 | Discharge: 2023-05-24 | Disposition: A | Discharge status: Home / Self Care | Source: Ambulatory Visit | Attending: Urology | Admitting: Urology

## 2023-05-24 ENCOUNTER — Encounter (HOSPITAL_COMMUNITY): Payer: Self-pay

## 2023-05-24 VITALS — BP 147/81 | HR 75 | Temp 97.7°F | Ht 64.0 in | Wt 225.2 lb

## 2023-05-24 DIAGNOSIS — Z01818 Encounter for other preprocedural examination: Secondary | ICD-10-CM | POA: Insufficient documentation

## 2023-05-24 DIAGNOSIS — I10 Essential (primary) hypertension: Secondary | ICD-10-CM | POA: Insufficient documentation

## 2023-05-24 HISTORY — DX: Essential (primary) hypertension: I10

## 2023-05-24 HISTORY — DX: Unspecified right bundle-branch block: I45.10

## 2023-05-24 LAB — CBC
HCT: 41.5 % (ref 36.0–46.0)
Hemoglobin: 12.4 g/dL (ref 12.0–15.0)
MCH: 28.9 pg (ref 26.0–34.0)
MCHC: 29.9 g/dL — ABNORMAL LOW (ref 30.0–36.0)
MCV: 96.7 fL (ref 80.0–100.0)
Platelets: 252 10*3/uL (ref 150–400)
RBC: 4.29 MIL/uL (ref 3.87–5.11)
RDW: 13.7 % (ref 11.5–15.5)
WBC: 4.7 10*3/uL (ref 4.0–10.5)
nRBC: 0 % (ref 0.0–0.2)

## 2023-05-24 LAB — BASIC METABOLIC PANEL WITH GFR
Anion gap: 7 (ref 5–15)
BUN: 18 mg/dL (ref 8–23)
CO2: 26 mmol/L (ref 22–32)
Calcium: 8.9 mg/dL (ref 8.9–10.3)
Chloride: 106 mmol/L (ref 98–111)
Creatinine, Ser: 0.53 mg/dL (ref 0.44–1.00)
GFR, Estimated: >60 mL/min (ref >60)
Glucose, Bld: 103 mg/dL — ABNORMAL HIGH (ref 70–99)
Potassium: 4 mmol/L (ref 3.5–5.1)
Sodium: 139 mmol/L (ref 135–145)

## 2023-05-24 NOTE — Progress Notes (Addendum)
 For Anesthesia: PCP - Sandford Craze, NP  Cardiologist - N/A  Bowel Prep reminder:  Chest x-ray -  EKG - 05/24/23 Stress Test -  ECHO -  Cardiac Cath -  Pacemaker/ICD device last checked: Pacemaker orders received: Device Rep notified:  Spinal Cord Stimulator:N/A  Sleep Study - N/A CPAP -   Fasting Blood Sugar -N/A  Checks Blood Sugar _____ times a day Date and result of last Hgb A1c-  Last dose of GLP1 agonist- N/A GLP1 instructions:   Last dose of SGLT-2 inhibitors- N/A SGLT-2 instructions:   Blood Thinner Instructions:N/A Aspirin Instructions: Last Dose:  Activity level: Can go up a flight of stairs and activities of daily living without stopping and without chest pain and/or shortness of breath   Able to exercise without chest pain and/or shortness of breath  Anesthesia review: Hx: HTN,Rt. BBB  Patient denies shortness of breath, fever, cough and chest pain at PAT appointment   Patient verbalized understanding of instructions that were given to them at the PAT appointment. Patient was also instructed that they will need to review over the PAT instructions again at home before surgery.

## 2023-05-25 ENCOUNTER — Other Ambulatory Visit (HOSPITAL_BASED_OUTPATIENT_CLINIC_OR_DEPARTMENT_OTHER): Payer: Self-pay

## 2023-05-26 ENCOUNTER — Other Ambulatory Visit (HOSPITAL_BASED_OUTPATIENT_CLINIC_OR_DEPARTMENT_OTHER): Payer: Self-pay

## 2023-05-29 ENCOUNTER — Other Ambulatory Visit (HOSPITAL_BASED_OUTPATIENT_CLINIC_OR_DEPARTMENT_OTHER): Payer: Self-pay

## 2023-05-30 ENCOUNTER — Other Ambulatory Visit (HOSPITAL_BASED_OUTPATIENT_CLINIC_OR_DEPARTMENT_OTHER): Payer: Self-pay

## 2023-05-31 ENCOUNTER — Encounter: Payer: Self-pay | Admitting: Family

## 2023-05-31 ENCOUNTER — Other Ambulatory Visit (HOSPITAL_BASED_OUTPATIENT_CLINIC_OR_DEPARTMENT_OTHER): Payer: Self-pay

## 2023-05-31 MED ORDER — ALPRAZOLAM 1 MG PO TABS: ORAL_TABLET | ORAL | 0 refills | Status: DC

## 2023-06-01 ENCOUNTER — Other Ambulatory Visit (HOSPITAL_BASED_OUTPATIENT_CLINIC_OR_DEPARTMENT_OTHER): Payer: Self-pay

## 2023-06-02 ENCOUNTER — Other Ambulatory Visit (HOSPITAL_BASED_OUTPATIENT_CLINIC_OR_DEPARTMENT_OTHER): Payer: Self-pay

## 2023-06-05 ENCOUNTER — Other Ambulatory Visit (HOSPITAL_BASED_OUTPATIENT_CLINIC_OR_DEPARTMENT_OTHER): Payer: Self-pay

## 2023-06-06 ENCOUNTER — Other Ambulatory Visit (HOSPITAL_BASED_OUTPATIENT_CLINIC_OR_DEPARTMENT_OTHER): Payer: Self-pay

## 2023-06-07 ENCOUNTER — Other Ambulatory Visit (HOSPITAL_BASED_OUTPATIENT_CLINIC_OR_DEPARTMENT_OTHER): Payer: Self-pay

## 2023-06-07 NOTE — Anesthesia Preprocedure Evaluation (Signed)
 Anesthesia Evaluation  Patient identified by MRN, date of birth, ID band Patient awake    Reviewed: Allergy & Precautions, H&P , NPO status , Patient's Chart, lab work & pertinent test results  Airway Mallampati: II  TM Distance: >3 FB Neck ROM: Full    Dental no notable dental hx.    Pulmonary  Sarcoidosis   Pulmonary exam normal breath sounds clear to auscultation       Cardiovascular hypertension, Normal cardiovascular exam Rhythm:Regular Rate:Normal     Neuro/Psych  PSYCHIATRIC DISORDERS Anxiety     negative neurological ROS     GI/Hepatic Neg liver ROS,GERD  ,,  Endo/Other  negative endocrine ROS    Renal/GU Kidney stone  negative genitourinary   Musculoskeletal  (+) Arthritis ,    Abdominal   Peds negative pediatric ROS (+)  Hematology  (+) Blood dyscrasia, anemia   Anesthesia Other Findings   Reproductive/Obstetrics negative OB ROS                             Anesthesia Physical Anesthesia Plan  ASA: 3  Anesthesia Plan: General   Post-op Pain Management:    Induction: Intravenous  PONV Risk Score and Plan: 3 and Ondansetron , Dexamethasone  and Treatment may vary due to age or medical condition  Airway Management Planned: LMA  Additional Equipment:   Intra-op Plan:   Post-operative Plan: Extubation in OR  Informed Consent: I have reviewed the patients History and Physical, chart, labs and discussed the procedure including the risks, benefits and alternatives for the proposed anesthesia with the patient or authorized representative who has indicated his/her understanding and acceptance.     Dental advisory given  Plan Discussed with: CRNA  Anesthesia Plan Comments:        Anesthesia Quick Evaluation

## 2023-06-08 ENCOUNTER — Other Ambulatory Visit: Payer: Self-pay

## 2023-06-08 ENCOUNTER — Ambulatory Visit (HOSPITAL_COMMUNITY)

## 2023-06-08 ENCOUNTER — Encounter (HOSPITAL_COMMUNITY): Payer: Self-pay | Admitting: Urology

## 2023-06-08 ENCOUNTER — Ambulatory Visit (HOSPITAL_COMMUNITY): Admitting: Anesthesiology

## 2023-06-08 ENCOUNTER — Ambulatory Visit (HOSPITAL_COMMUNITY)
Admission: RE | Admit: 2023-06-08 | Discharge: 2023-06-08 | Disposition: A | Discharge status: Home / Self Care | Source: Ambulatory Visit | Attending: Urology | Admitting: Urology

## 2023-06-08 ENCOUNTER — Encounter (HOSPITAL_COMMUNITY)
Admission: RE | Disposition: A | Discharge status: Home / Self Care | Payer: Self-pay | Source: Ambulatory Visit | Attending: Urology

## 2023-06-08 ENCOUNTER — Ambulatory Visit

## 2023-06-08 ENCOUNTER — Ambulatory Visit (HOSPITAL_BASED_OUTPATIENT_CLINIC_OR_DEPARTMENT_OTHER): Admitting: Anesthesiology

## 2023-06-08 DIAGNOSIS — I10 Essential (primary) hypertension: Secondary | ICD-10-CM | POA: Diagnosis not present

## 2023-06-08 DIAGNOSIS — K219 Gastro-esophageal reflux disease without esophagitis: Secondary | ICD-10-CM | POA: Diagnosis not present

## 2023-06-08 DIAGNOSIS — N200 Calculus of kidney: Secondary | ICD-10-CM

## 2023-06-08 DIAGNOSIS — N1330 Unspecified hydronephrosis: Secondary | ICD-10-CM | POA: Diagnosis not present

## 2023-06-08 DIAGNOSIS — Z6838 Body mass index (BMI) 38.0-38.9, adult: Secondary | ICD-10-CM | POA: Insufficient documentation

## 2023-06-08 DIAGNOSIS — D860 Sarcoidosis of lung: Secondary | ICD-10-CM | POA: Insufficient documentation

## 2023-06-08 DIAGNOSIS — N201 Calculus of ureter: Secondary | ICD-10-CM | POA: Insufficient documentation

## 2023-06-08 DIAGNOSIS — E669 Obesity, unspecified: Secondary | ICD-10-CM | POA: Insufficient documentation

## 2023-06-08 HISTORY — PX: CYSTOSCOPY/URETEROSCOPY/HOLMIUM LASER/STENT PLACEMENT: SHX6546

## 2023-06-08 SURGERY — CYSTOSCOPY/URETEROSCOPY/HOLMIUM LASER/STENT PLACEMENT
Anesthesia: General | Site: Pelvis | Laterality: Right

## 2023-06-08 MED ORDER — SODIUM CHLORIDE 0.9 % IR SOLN
Status: DC | PRN
  Administered 2023-06-08: 3000 mL

## 2023-06-08 MED ORDER — LACTATED RINGERS IV SOLN: INTRAVENOUS | Status: DC

## 2023-06-08 MED ORDER — FENTANYL CITRATE (PF) 100 MCG/2ML IJ SOLN
INTRAMUSCULAR | Status: AC
  Filled 2023-06-08: qty 2

## 2023-06-08 MED ORDER — ACETAMINOPHEN 10 MG/ML IV SOLN
INTRAVENOUS | Status: AC
  Filled 2023-06-08: qty 100

## 2023-06-08 MED ORDER — LIDOCAINE HCL (CARDIAC) PF 100 MG/5ML IV SOSY
PREFILLED_SYRINGE | INTRAVENOUS | Status: DC | PRN
  Administered 2023-06-08: 100 mg via INTRAVENOUS

## 2023-06-08 MED ORDER — FENTANYL CITRATE (PF) 100 MCG/2ML IJ SOLN
INTRAMUSCULAR | Status: DC | PRN
  Administered 2023-06-08 (×4): 25 ug via INTRAVENOUS

## 2023-06-08 MED ORDER — ACETAMINOPHEN 10 MG/ML IV SOLN
1000.0000 mg | Freq: Once | INTRAVENOUS | Status: DC | PRN
  Administered 2023-06-08: 1000 mg via INTRAVENOUS

## 2023-06-08 MED ORDER — CHLORHEXIDINE GLUCONATE 0.12 % MT SOLN
15.0000 mL | Freq: Once | OROMUCOSAL | Status: AC
  Administered 2023-06-08: 15 mL via OROMUCOSAL

## 2023-06-08 MED ORDER — EPHEDRINE SULFATE-NACL 50-0.9 MG/10ML-% IV SOSY
PREFILLED_SYRINGE | INTRAVENOUS | Status: DC | PRN
  Administered 2023-06-08 (×4): 5 mg via INTRAVENOUS

## 2023-06-08 MED ORDER — FENTANYL CITRATE PF 50 MCG/ML IJ SOSY
25.0000 ug | PREFILLED_SYRINGE | INTRAMUSCULAR | Status: DC | PRN
  Administered 2023-06-08: 50 ug via INTRAVENOUS

## 2023-06-08 MED ORDER — DEXAMETHASONE SODIUM PHOSPHATE 10 MG/ML IJ SOLN
INTRAMUSCULAR | Status: AC
  Filled 2023-06-08: qty 1

## 2023-06-08 MED ORDER — OXYCODONE HCL 5 MG/5ML PO SOLN: 5.0000 mg | Freq: Once | ORAL | Status: AC | PRN

## 2023-06-08 MED ORDER — FENTANYL CITRATE PF 50 MCG/ML IJ SOSY
PREFILLED_SYRINGE | INTRAMUSCULAR | Status: AC
  Filled 2023-06-08: qty 1

## 2023-06-08 MED ORDER — PROPOFOL 10 MG/ML IV BOLUS
INTRAVENOUS | Status: DC | PRN
  Administered 2023-06-08: 200 mg via INTRAVENOUS

## 2023-06-08 MED ORDER — IOHEXOL 300 MG/ML  SOLN
INTRAMUSCULAR | Status: DC | PRN
  Administered 2023-06-08: 9 mL

## 2023-06-08 MED ORDER — MIDAZOLAM HCL 2 MG/2ML IJ SOLN
INTRAMUSCULAR | Status: AC
  Filled 2023-06-08: qty 2

## 2023-06-08 MED ORDER — LIDOCAINE HCL (PF) 2 % IJ SOLN
INTRAMUSCULAR | Status: AC
Start: 2023-06-08 — End: ?
  Filled 2023-06-08: qty 5

## 2023-06-08 MED ORDER — FENTANYL CITRATE (PF) 100 MCG/2ML IJ SOLN
INTRAMUSCULAR | Status: AC
Start: 2023-06-08 — End: ?
  Filled 2023-06-08: qty 2

## 2023-06-08 MED ORDER — PROPOFOL 10 MG/ML IV BOLUS
INTRAVENOUS | Status: AC
  Filled 2023-06-08: qty 20

## 2023-06-08 MED ORDER — DEXTROSE 5 % IV SOLN
320.0000 mg | INTRAVENOUS | Status: AC
  Administered 2023-06-08: 320 mg via INTRAVENOUS
  Filled 2023-06-08: qty 8

## 2023-06-08 MED ORDER — MIDAZOLAM HCL 5 MG/5ML IJ SOLN
INTRAMUSCULAR | Status: DC | PRN
  Administered 2023-06-08: 2 mg via INTRAVENOUS

## 2023-06-08 MED ORDER — OXYCODONE HCL 5 MG PO TABS
ORAL_TABLET | ORAL | Status: DC
Start: 2023-06-08 — End: 2023-06-08
  Filled 2023-06-08: qty 1

## 2023-06-08 MED ORDER — ORAL CARE MOUTH RINSE: 15.0000 mL | Freq: Once | OROMUCOSAL | Status: AC

## 2023-06-08 MED ORDER — OXYCODONE HCL 5 MG PO TABS
5.0000 mg | ORAL_TABLET | Freq: Once | ORAL | Status: AC | PRN
  Administered 2023-06-08: 5 mg via ORAL

## 2023-06-08 MED ORDER — ONDANSETRON HCL 4 MG/2ML IJ SOLN
INTRAMUSCULAR | Status: DC | PRN
  Administered 2023-06-08: 4 mg via INTRAVENOUS

## 2023-06-08 MED ORDER — CIPROFLOXACIN HCL 500 MG PO TABS: 500.0000 mg | ORAL_TABLET | Freq: Once | ORAL | 0 refills | Status: AC

## 2023-06-08 MED ORDER — DROPERIDOL 2.5 MG/ML IJ SOLN: 0.6250 mg | Freq: Once | INTRAMUSCULAR | Status: DC | PRN

## 2023-06-08 MED ORDER — TRAMADOL HCL 50 MG PO TABS: 50.0000 mg | ORAL_TABLET | Freq: Four times a day (QID) | ORAL | 0 refills | Status: DC | PRN

## 2023-06-08 MED ORDER — ONDANSETRON HCL 4 MG/2ML IJ SOLN
INTRAMUSCULAR | Status: AC
Start: 2023-06-08 — End: ?
  Filled 2023-06-08: qty 2

## 2023-06-08 MED ORDER — DEXAMETHASONE SODIUM PHOSPHATE 10 MG/ML IJ SOLN
INTRAMUSCULAR | Status: DC | PRN
Start: 2023-06-08 — End: 2023-06-08
  Administered 2023-06-08: 5 mg via INTRAVENOUS

## 2023-06-08 SURGICAL SUPPLY — 19 items
BAG URO CATCHER STRL LF (MISCELLANEOUS) ×1 IMPLANT
BASKET ZERO TIP NITINOL 2.4FR (BASKET) IMPLANT
CATH URETL OPEN 5X70 (CATHETERS) ×1 IMPLANT
CLOTH BEACON ORANGE TIMEOUT ST (SAFETY) ×1 IMPLANT
EXTRACTOR STONE 1.7FRX115CM (UROLOGICAL SUPPLIES) IMPLANT
GLOVE SURG LX STRL 7.5 STRW (GLOVE) ×1 IMPLANT
GOWN STRL REUS W/ TWL XL LVL3 (GOWN DISPOSABLE) ×1 IMPLANT
GUIDEWIRE ANG ZIPWIRE 038X150 (WIRE) IMPLANT
GUIDEWIRE STR DUAL SENSOR (WIRE) ×1 IMPLANT
KIT TURNOVER KIT A (KITS) IMPLANT
LASER FIB FLEXIVA PULSE ID 365 (Laser) IMPLANT
MANIFOLD NEPTUNE II (INSTRUMENTS) ×1 IMPLANT
PACK CYSTO (CUSTOM PROCEDURE TRAY) ×1 IMPLANT
SHEATH NAVIGATOR HD 12/14X28 (SHEATH) IMPLANT
SHEATH NAVIGATOR HD 12/14X36 (SHEATH) IMPLANT
STENT URET 6FRX24 CONTOUR (STENTS) IMPLANT
TRACTIP FLEXIVA PULS ID 200XHI (Laser) IMPLANT
TUBING CONNECTING 10 (TUBING) ×1 IMPLANT
TUBING UROLOGY SET (TUBING) ×1 IMPLANT

## 2023-06-08 NOTE — Op Note (Signed)
 Preoperative diagnosis:  Right distal ureteral stones and large UPJ stone  Postoperative diagnosis:  Same  Procedure: Cystoscopy, right retrograde pyelogram with interpretation Right ureteroscopy, laser lithotripsy Right ureteral stent placement  Surgeon: Andrez Banker, MD  Anesthesia: General  Complications: None  Intraoperative findings:  #1: The patient's right retrograde pyelogram demonstrated 2 small filling defects in the distal part of the ureter as well as a large filling defect at the UPJ.  There was mild hydroureteronephrosis. #2: There were 2 stones in the distal ureter that I was able to remove after cracking them with a laser.  The patient had a lot of clot around the stone at the UPJ.  The stone was fairly soft and with the laser stone fragmented quickly and easily.  Most of the stone was fragmented into dust, some of the stone was removed with a basket.  She may have some residual stone in the lower pole once everything clears. #3: A 24 cm 6 French double-J stent in the right ureter was placed with a stent tether.  EBL: Minimal  Specimens: None  Indication: [name] [name] is a 68 y.o. patient with history of urinary frequency and urgency.  CT scan was performed demonstrating 2 distal right ureteral stones and a large stone at the UPJ.  After reviewing the management options for treatment, he elected to proceed with the above surgical procedure(s). We have discussed the potential benefits and risks of the procedure, side effects of the proposed treatment, the likelihood of the patient achieving the goals of the procedure, and any potential problems that might occur during the procedure or recuperation. Informed consent has been obtained.  Description of procedure:   Consent was obtained in the preoperative holding area.  She was brought back to the operating room placed on the table in supine position.  General esthesia was then induced and endotracheal tube was  inserted.  She was placed in dorsolithotomy position and prepped and draped in the routine sterile fashion.  Timeout subsequently performed.  21 French Blaise Bumps Sisco was gently passed to the patient's urethra and in the bladder under visual guidance.  Cystoscopy demonstrated orthotopic UOs with no additional mucosal abnormalities.  I then cannulated the patient's right ureteral orifice with a 5 French open ureteral catheter and performed retrograde pyelogram with the above findings.  Subsequently advanced a wire through the opening catheter removing the catheter over the wire.  I emptied the patient's bladder and removed the scope.  I then advanced a semirigid ureteroscope alongside the wire and cannulate the patient's right ureteral orifice.  I encountered the 2 small stones in the distal ureter.  Using a 365 m laser fiber I fragmented the stones into small pieces and then removed them with the ZeroTip basket.  There were no additional stones within the ureter.  I subsequently advanced a second wire through the ureteroscope and removed the ureteroscope over the wire.  I advanced a 12/14 French x 35 cm ureteral access sheath over the second wire and into the proximal ureter remove the inner portion of the access sheath and the wire.  I then advanced the flexible ureteroscope up into the patient's right renal pelvis and performed pyeloscopy with above findings.  The stone was at the within the renal pelvis and there were significant amount of blood clot and debris around it.  I irrigated the renal pelvis and removed a lot of the clot.  I then fragmented the stone into bits.  I was able to  remove some of these pieces as well as some of the blood clot.  I then chased all the big fragments and fragmented them as best I can could so that they were in pieces that would pass.  I slowly backed out the ureteroscope and the access sheath simultaneously noting no ureteral trauma.  There was a large clot within the patient's  ureter that I removed prior to removing the access sheath.  I subsequently advanced the 24 cm time 6 French double-J ureteral stent over the wire and into the upper pole of the right kidney under fluoroscopic guidance.  Once it was noted to be well within the kidney I would visit to the urethra prior to removing the wire.  The stent then popped into the patient's bladder with a nice curl as confirmed by fluoroscopy.  The patient's bladder was subsequently emptied.  Stent tether was pulled through the urethra and tucked into the patient's vagina.  The patient was subsequently extubated returned to the PACU in stable condition with known to her complications.  Disposition: The patient is being discharged home, she is being instructed to remove the stent on Monday, April 28.  She is follow-up in our office in 6 weeks with a renal ultrasound.

## 2023-06-08 NOTE — Transfer of Care (Signed)
 Immediate Anesthesia Transfer of Care Note  Patient: [name] [name]  Procedure(s) Performed: CYSTOSCOPY/URETEROSCOPY/HOLMIUM LASER/STENT PLACEMENT (Right: Pelvis)  Patient Location: PACU  Anesthesia Type:General  Level of Consciousness: awake, alert , oriented, and patient cooperative  Airway & Oxygen Therapy: Patient Spontanous Breathing and Patient connected to face mask oxygen  Post-op Assessment: Report given to RN and Post -op Vital signs reviewed and stable  Post vital signs: Reviewed and stable  Last Vitals:  Vitals Value Taken Time  BP 132/76 06/08/23 0930  Temp    Pulse 79 06/08/23 0932  Resp 21 06/08/23 0932  SpO2 100 % 06/08/23 0932  Vitals shown include unfiled device data.  Last Pain:  Vitals:   06/08/23 0548  TempSrc: Oral  PainSc:          Complications: No notable events documented.

## 2023-06-08 NOTE — Anesthesia Procedure Notes (Signed)
 Procedure Name: LMA Insertion Date/Time: 06/08/2023 7:47 AM  Performed by: Mervyn Ace, CRNAPre-anesthesia Checklist: Patient identified, Emergency Drugs available, Suction available, Patient being monitored and Timeout performed Patient Re-evaluated:Patient Re-evaluated prior to induction Oxygen Delivery Method: Circle system utilized Preoxygenation: Pre-oxygenation with 100% oxygen Induction Type: IV induction Ventilation: Mask ventilation without difficulty LMA: LMA with gastric port inserted LMA Size: 4.0 Number of attempts: 1 Airway Equipment and Method: Patient positioned with wedge pillow (patient with previous neck surgery; states she is "more comfortable": with head elevated slightly) Placement Confirmation: positive ETCO2 and CO2 detector Tube secured with: Tape (LMA secured with pink Hy-tape) Dental Injury: Teeth and Oropharynx as per pre-operative assessment

## 2023-06-08 NOTE — Anesthesia Postprocedure Evaluation (Signed)
 Anesthesia Post Note  Patient: Devora T Mcclintic  Procedure(s) Performed: CYSTOSCOPY/URETEROSCOPY/HOLMIUM LASER/STENT PLACEMENT (Right: Pelvis)     Patient location during evaluation: PACU Anesthesia Type: General Level of consciousness: awake and alert Pain management: pain level controlled Vital Signs Assessment: post-procedure vital signs reviewed and stable Respiratory status: spontaneous breathing, nonlabored ventilation, respiratory function stable and patient connected to nasal cannula oxygen Cardiovascular status: blood pressure returned to baseline and stable Postop Assessment: no apparent nausea or vomiting Anesthetic complications: no   No notable events documented.  Last Vitals:  Vitals:   06/08/23 1045 06/08/23 1059  BP: 106/68 (!) 144/80  Pulse: 73 76  Resp: 17 12  Temp: 36.8 C 36.8 C  SpO2: 98% 93%    Last Pain:  Vitals:   06/08/23 1059  TempSrc:   PainSc: 3                  Lethaniel Rave

## 2023-06-08 NOTE — H&P (Signed)
 Follow-up for ESBL E. coli   Patient has history of overactive bladder managed with Myrbetriq . She also has history of recurrent urinary tract infections. She had been doing quite well, but did over the course of the last month developed an ESBL E. coli infection. This was treated by her primary care doctor. She had a recurrent or incompletely treated infection that again was treated by her primary care doctor. She is just completed a 7-day course of Bactrim . She continues to have significant voiding symptoms including worsening urgency and associated incontinence. She is having dysuria as well. She denies any fevers.    Interval: Today the patient is here for reevaluation. She is having a lot of urinary urgency and frequency. She was seen by her primary care doctor and urine culture was negative. She has a lot of suprapubic burning. She denies any incontinence. She has not had any gross hematuria. She has had some nausea. Her pain seems to be on the left side, but radiates across to the middle. She does have a history of stones, but has not passed 1 in quite some time. Her pain has been reasonably well-controlled, but she does have a history of chronic pain and seems to feel as if she is good managing her pain.     ALLERGIES: Cephalexin  Macrobid CAPS    MEDICATIONS: Myrbetriq  50 MG Tablet Extended Release 24 Hour 1 tablet PO Daily  Allegra TABS Oral  ALPRAZolam  0.5 MG Oral Tablet Oral  busPIRone  HCl 5 MG Tablet  Iron   Multi Vitamin Tablet Oral  Omeprazole  CPDR Oral  PARoxetine  HCl 30 MG Tablet Oral  Phenazopyridine  HCl 200 MG Tablet 0 Oral  valACYclovir  HCl 500 MG Tablet Oral  Vita-C  Zinc      GU PSH: Cystoscopy Hydrodistention - 2013       PSH Notes: Cystoscopy With Dilation Of Bladder, Bladder Irrigation, Knee Replacement, Abdominal Surgery, Shoulder Surgery   NON-GU PSH: Anesth, Lower Arm Surgery Revise Knee Joint - 2008 Total Knee Replacement, Right Visit Complexity (formerly  GPC1X) - 01/17/2023     GU PMH: Acute Cystitis/UTI (Stable) - 01/17/2023, (Stable), - 10/20/2021 (Stable), - 2023, No recent episodes., - 2023, - 2021, - 2021, - 2020 (Worsening), Her urine looks infected and she is symptomatic, - 2020 (Stable), - 2019, Acute cystitis without hematuria, - 2014 Overactive bladder - 01/17/2023, - 10/31/2022, - 10/20/2021, - 2023, She does well with Myrbetriq  50 mg, - 2023 (Worsening), She does have worsened symptoms with what appears to be a UTI., - 2020, - 2019, - 2019 (Stable), She is doing well on Myrbetriq , - 2018 Candidiasis of vulva and vagina - 2020 Dysuria - 2020, Dysuria, - 2015 Postmenopausal atrophic vaginitis (Stable) - 2020, Atrophic vaginitis, - 2016 Interstitial Cystitis (w/o hematuria), Chronic interstitial cystitis without hematuria - 2016 Urinary Frequency, Increased urinary frequency - 2016 Straining on Urination, Straining on urination - 2015 Gross hematuria, Gross hematuria - 2014 Pelvic/perineal pain, Abdominal pain, suprapubic - 2014      PMH Notes:  2006-08-09 14:16:42 - Note: Arthritis   NON-GU PMH: Encounter for general adult medical examination without abnormal findings, Encounter for preventive health examination - 2016 Anxiety, Anxiety - 2014 Personal history of other diseases of the digestive system, History of esophageal reflux - 2014    FAMILY HISTORY: Diabetes - Father Family Health Status Number - Runs In Family Hypertension - Mother nephrolithiasis - Father Pure Hypercholesterolemia - Mother   SOCIAL HISTORY: Marital Status: Married Preferred Language: English; Ethnicity: Not Hispanic Or  Latino; Race: White Current Smoking Status: Patient has never smoked.   Tobacco Use Assessment Completed: Used Tobacco in last 30 days? Social Drinker.  Drinks 2 caffeinated drinks per day.     Notes: Never A Smoker, Marital History - Single, Tobacco Use, Caffeine Use, Alcohol Use, Occupation:   REVIEW OF SYSTEMS:    GU Review Female:    Patient reports frequent urination, burning /pain with urination, and trouble starting your stream. Patient denies hard to postpone urination, get up at night to urinate, leakage of urine, stream starts and stops, have to strain to urinate, and being pregnant.  Gastrointestinal (Upper):   Patient denies nausea, vomiting, and indigestion/ heartburn.  Gastrointestinal (Lower):   Patient denies diarrhea and constipation.  Constitutional:   Patient denies fever, night sweats, weight loss, and fatigue.  Skin:   Patient denies itching and skin rash/ lesion.  Eyes:   Patient denies blurred vision and double vision.  Ears/ Nose/ Throat:   Patient denies sore throat and sinus problems.  Hematologic/Lymphatic:   Patient denies swollen glands and easy bruising.  Cardiovascular:   Patient denies leg swelling and chest pains.  Respiratory:   Patient denies cough and shortness of breath.  Endocrine:   Patient denies excessive thirst.  Musculoskeletal:   Patient denies back pain and joint pain.  Neurological:   Patient denies headaches and dizziness.  Psychologic:   Patient denies depression and anxiety.   Notes: Blood in urine, back pain for 3 weeks, left flank pain    VITAL SIGNS: None   MULTI-SYSTEM PHYSICAL EXAMINATION:    Constitutional: Well-nourished. No physical deformities. Normally developed. Good grooming.  Respiratory: Normal breath sounds. No labored breathing, no use of accessory muscles.   Cardiovascular: Regular rate and rhythm. No murmur, no gallop. Normal temperature, normal extremity pulses, no swelling, no varicosities.      Complexity of Data:  Source Of History:  Patient  Records Review:   Previous Doctor Records, Previous Patient Records, POC Tool  Urine Test Review:   Urinalysis  X-Ray Review: C.T. Abdomen/Pelvis: Reviewed Films. Discussed With Patient.     PROCEDURES:         C.T. ABD-Pelv w/o - 82956      Patient confirmed No Neulasta OnPro Device.            Visit Complexity - G2211          Urinalysis w/Scope Dipstick Dipstick Cont'd Micro  Color: Yellow Bilirubin: Neg mg/dL WBC/hpf: NS (Not Seen)  Appearance: Clear Ketones: Neg mg/dL RBC/hpf: 10 - 21/HYQ  Specific Gravity: <=1.005 Blood: 3+ ery/uL Bacteria: Rare (0-9/hpf)  pH: 6.0 Protein: Neg mg/dL Cystals: NS (Not Seen)  Glucose: Neg mg/dL Urobilinogen: 0.2 mg/dL Casts: NS (Not Seen)    Nitrites: Neg Trichomonas: Not Present    Leukocyte Esterase: Neg leu/uL Mucous: Not Present      Epithelial Cells: NS (Not Seen)      Yeast: NS (Not Seen)      Sperm: Not Present    ASSESSMENT:      ICD-10 Details  1 GU:   Gross hematuria - R31.0   2   Flank Pain - R10.84    PLAN:            Medications New Meds: Tamsulosin HCl 0.4 MG Capsule 1 capsule PO Daily   #30  0 Refill(s)  Pharmacy Name:  [id number]  Address:  [address]   [place], [place] [address]  Phone:  [phone]  Fax:  [phone]            Orders X-Rays: C.T. Abdomen/Pelvis Without I.V. Contrast  X-Ray Notes: History:   Hematuria: Yes / No   Patient to see MD after exam: Yes/ No   Previous exam:   When:   Where:   Diabetic: Yes / No   BUN/ Creatinine:   Date of last BUN Creatinine:   Weight in pounds:   Allergy- IV Contrast: Yes/ No  Prior Authorization #: [id number]           Schedule         Document Letter(s):  Created for Patient: Clinical Summary         Notes:   The patient had a CT scan today in clinic that demonstrated a stone or possibly a second stone at the right UVJ. She had mild hydroureteronephrosis. She had a large stone at the right UPJ. I explained this to the patient. Our plan is to treat her with ureteroscopy. I have given her some tamsulosin see if she can pass the stone in the distal ureter. She does need surgery for the other stone, I think shockwave lithotripsy would create a Steinstrasse, as a result I recommend against that. Instead, I recommended  ureteroscopy. I went through the surgery with her in detail. We also discussed the stent which she understands. Will likely have her pull her own stent out and then follow-up in 6 weeks with a renal ultrasound.   The patient does not appear to have any infection which is good news. She will contact us  she develops 1. Have given her strict return precautions.

## 2023-06-08 NOTE — Discharge Instructions (Signed)
 DISCHARGE INSTRUCTIONS FOR KIDNEY STONE/URETERAL STENT   MEDICATIONS:  1.  Resume all your other meds from home - except do not take any extra narcotic pain meds that you may have at home.  2. Tramadol  is for moderate/severe pain, otherwise taking upto 1000 mg every 6 hours of plainTylenol will help treat your pain.   3.  Resume the tamsulosin as we discussed until the stent is removed and any discomfort has resolved.  4. Take Cipro  one hour prior to removal of your stent.   ACTIVITY:  1. No strenuous activity x 1week  2. No driving while on narcotic pain medications  3. Drink plenty of water   4. Continue to walk at home - you can still get blood clots when you are at home, so keep active, but don't over do it.  5. May return to work/school tomorrow or when you feel ready   BATHING:  1. You can shower and we recommend daily showers  2. You have a string coming from your urethra: The stent string is attached to your ureteral stent. Do not pull on this.   SIGNS/SYMPTOMS TO CALL:  Please call us  if you have a fever greater than 101.5, uncontrolled nausea/vomiting, uncontrolled pain, dizziness, unable to urinate, bloody urine, chest pain, shortness of breath, leg swelling, leg pain, redness around wound, drainage from wound, or any other concerns or questions.   You can reach us  at [phone].   FOLLOW-UP:  1. You have an appointment in 6 weeks with a ultrasound of your kidneys prior.  2. You have a string attached to your stent, you may remove it on Monday, April 28th. To do this, pull the strings until the stents are completely removed. You may feel an odd sensation in your back.

## 2023-06-08 NOTE — Interval H&P Note (Signed)
 History and Physical Interval Note:  06/08/2023 7:32 AM  [name] [name]  has presented today for surgery, with the diagnosis of RIGHT KIDNEY STONE.  The various methods of treatment have been discussed with the patient and family. After consideration of risks, benefits and other options for treatment, the patient has consented to  Procedure(s) with comments: CYSTOSCOPY/URETEROSCOPY/HOLMIUM LASER/STENT PLACEMENT (Right) - CYSTOSCOPY/RIGHT URETEROSCOPY/HOLMIUM LASER LITHOTRIPSY/STONE EXTRACTION/STENT PLACEMENT as a surgical intervention.  The patient's history has been reviewed, patient examined, no change in status, stable for surgery.  I have reviewed the patient's chart and labs.  Questions were answered to the patient's satisfaction.     Andrez Banker

## 2023-06-15 LAB — CALCULI, WITH PHOTOGRAPH (CLINICAL LAB)
Calcium Oxalate Dihydrate: 10 %
Calcium Oxalate Monohydrate: 90 %
Weight Calculi: 80 mg

## 2023-06-21 ENCOUNTER — Ambulatory Visit: Payer: Medicare Other | Admitting: Family

## 2023-06-30 ENCOUNTER — Encounter: Payer: Self-pay | Admitting: Family

## 2023-06-30 ENCOUNTER — Other Ambulatory Visit: Payer: Self-pay | Admitting: Family

## 2023-06-30 MED ORDER — ALPRAZOLAM 1 MG PO TABS: ORAL_TABLET | ORAL | 0 refills | Status: DC

## 2023-06-30 NOTE — Addendum Note (Signed)
 Addended by: Dorrene Gaucher on: 06/30/2023 05:42 PM   Modules accepted: Orders

## 2023-07-11 DIAGNOSIS — H47333 Pseudopapilledema of optic disc, bilateral: Secondary | ICD-10-CM | POA: Diagnosis not present

## 2023-07-11 DIAGNOSIS — H353131 Nonexudative age-related macular degeneration, bilateral, early dry stage: Secondary | ICD-10-CM | POA: Diagnosis not present

## 2023-07-11 DIAGNOSIS — H35363 Drusen (degenerative) of macula, bilateral: Secondary | ICD-10-CM | POA: Diagnosis not present

## 2023-07-17 DIAGNOSIS — H4312 Vitreous hemorrhage, left eye: Secondary | ICD-10-CM | POA: Diagnosis not present

## 2023-07-17 DIAGNOSIS — H5000 Unspecified esotropia: Secondary | ICD-10-CM | POA: Diagnosis not present

## 2023-07-17 DIAGNOSIS — H33012 Retinal detachment with single break, left eye: Secondary | ICD-10-CM | POA: Diagnosis not present

## 2023-07-17 DIAGNOSIS — H353111 Nonexudative age-related macular degeneration, right eye, early dry stage: Secondary | ICD-10-CM | POA: Diagnosis not present

## 2023-07-18 DIAGNOSIS — H4312 Vitreous hemorrhage, left eye: Secondary | ICD-10-CM | POA: Diagnosis not present

## 2023-07-18 DIAGNOSIS — H33012 Retinal detachment with single break, left eye: Secondary | ICD-10-CM | POA: Diagnosis not present

## 2023-07-21 DIAGNOSIS — R350 Frequency of micturition: Secondary | ICD-10-CM | POA: Diagnosis not present

## 2023-07-21 DIAGNOSIS — R3915 Urgency of urination: Secondary | ICD-10-CM | POA: Diagnosis not present

## 2023-07-21 DIAGNOSIS — R8271 Bacteriuria: Secondary | ICD-10-CM | POA: Diagnosis not present

## 2023-07-21 DIAGNOSIS — N202 Calculus of kidney with calculus of ureter: Secondary | ICD-10-CM | POA: Diagnosis not present

## 2023-07-21 DIAGNOSIS — N130 Hydronephrosis with ureteropelvic junction obstruction: Secondary | ICD-10-CM | POA: Diagnosis not present

## 2023-07-24 ENCOUNTER — Other Ambulatory Visit: Payer: Self-pay | Admitting: Family

## 2023-07-24 ENCOUNTER — Encounter: Payer: Self-pay | Admitting: Family

## 2023-07-24 MED ORDER — BUSPIRONE HCL 15 MG PO TABS: 15.0000 mg | ORAL_TABLET | Freq: Two times a day (BID) | ORAL | 0 refills | Status: DC

## 2023-07-24 MED ORDER — BUSPIRONE HCL 15 MG PO TABS
15.0000 mg | ORAL_TABLET | Freq: Two times a day (BID) | ORAL | 0 refills | Status: DC
Start: 2023-07-24 — End: 2023-07-24

## 2023-07-24 NOTE — Addendum Note (Signed)
 Addended by: Dorrene Gaucher on: 07/24/2023 11:50 AM   Modules accepted: Orders

## 2023-07-26 DIAGNOSIS — N3000 Acute cystitis without hematuria: Secondary | ICD-10-CM | POA: Diagnosis not present

## 2023-07-31 ENCOUNTER — Encounter: Payer: Self-pay | Admitting: Family

## 2023-07-31 MED ORDER — ALPRAZOLAM 1 MG PO TABS: ORAL_TABLET | ORAL | 0 refills | Status: DC

## 2023-07-31 NOTE — Telephone Encounter (Signed)
 Requesting: alprazolam  1mg   Contract: 11/17/22 UDS:  12/15/21 Last Visit: 05/12/23 Next Visit: None Last Refill: 06/30/23 #30 and 0RF   Please Advise

## 2023-08-11 ENCOUNTER — Encounter: Payer: Self-pay | Admitting: Family

## 2023-08-11 MED ORDER — VALACYCLOVIR HCL 500 MG PO TABS: 500.0000 mg | ORAL_TABLET | Freq: Every day | ORAL | 0 refills | Status: DC | PRN

## 2023-08-14 ENCOUNTER — Encounter: Payer: Self-pay | Admitting: Family

## 2023-08-14 MED ORDER — SODIUM FLUORIDE 5000 PPM 1.1 % DT PSTE: PASTE | DENTAL | 2 refills | Status: DC

## 2023-08-16 ENCOUNTER — Other Ambulatory Visit (HOSPITAL_BASED_OUTPATIENT_CLINIC_OR_DEPARTMENT_OTHER): Payer: Self-pay

## 2023-08-16 ENCOUNTER — Ambulatory Visit (INDEPENDENT_AMBULATORY_CARE_PROVIDER_SITE_OTHER): Admitting: Family

## 2023-08-16 VITALS — BP 151/73 | HR 66 | Temp 97.8°F | Resp 17 | Ht 64.0 in | Wt 222.2 lb

## 2023-08-16 DIAGNOSIS — N760 Acute vaginitis: Secondary | ICD-10-CM

## 2023-08-16 DIAGNOSIS — J329 Chronic sinusitis, unspecified: Secondary | ICD-10-CM | POA: Diagnosis not present

## 2023-08-16 MED ORDER — AMOXICILLIN-POT CLAVULANATE 875-125 MG PO TABS
1.0000 | ORAL_TABLET | Freq: Two times a day (BID) | ORAL | 0 refills | Status: DC
  Filled 2023-08-16: qty 14, 7d supply, fill #0

## 2023-08-16 MED ORDER — FLUCONAZOLE 150 MG PO TABS
ORAL_TABLET | ORAL | 0 refills | Status: DC
  Filled 2023-08-16: qty 2, 2d supply, fill #0

## 2023-08-16 NOTE — Progress Notes (Signed)
 Subjective:     Patient ID: [name] [name], female    DOB: 06-Mar-1955, 68 y.o.   MRN: [id number]  Chief Complaint  Patient presents with   Sinus Problem    For months has been stopped up with pressure in sinus area, headache that does not respond to otc medications     Sinus Problem    Discussed the use of AI scribe software for clinical note transcription with the patient, who gave verbal consent to proceed.  History of Present Illness [name] [name] is a 68 year old female who presents with sinus pain and congestion.  She has experienced significant sinus pain and congestion in her cheeks and head for the past three days. The pain is severe, and Tylenol  and Mucinex have not alleviated her headache. Despite regular use of Allegra and Flonase , she remains congested and has difficulty breathing, affecting her sleep. She has a history of sinus infections, with the last episode treated with antibiotics in December. Over the past two months, she has experienced allergy-related symptoms and has been using Mucinex daily.      Health Maintenance Due  Topic Date Due   Medicare Annual Wellness (AWV)  Never done   COVID-19 Vaccine (4 - 2024-25 season) 10/16/2022    Past Medical History:  Diagnosis Date   Allergy    Anemia    takes iron  supplement   Anxiety    Arthritis    feet, shoulders,knees,    Bundle branch block, right    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   Elevated alkaline phosphatase level    negative workup (presumed secondary to fatty liver)   Frequency-urgency syndrome    GERD (gastroesophageal reflux disease)    none since weight loss surgery   H/O open hand wound 08/27/2022   H/O total shoulder replacement, 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   History of kidney stones    Hypertension    Interstitial cystitis    Dr Rebecca   Lymphadenopathy of head and neck 11/09/2018   Macular degeneration    Dry followed by Dr. Tobie Retinal specialist in [place]   Maxillary sinusitis  07/24/2020   Neuromuscular disorder (HCC)    rt arm carpal tunnel syndrome   Obesity    status post bariatric procedure in [place] 2005   Osteoporosis    Osteoporosis 08/21/2008   Sarcoidosis of lung (HCC)    Thyroid  nodule 08/2008   `   Thyroid  nodule    Varicose vein of leg     Past Surgical History:  Procedure Laterality Date   ANTERIOR CERVICAL DECOMP/DISCECTOMY FUSION N/A 06/21/2012   Procedure: ANTERIOR CERVICAL DECOMPRESSION/DISCECTOMY FUSION C-4 - C5 (SPACER/DePUY CERVICAL PLATE ONLY) 1 LEVEL;  Surgeon: Donaciano Sprang, MD;  Location: [facility] OR;  Service: Orthopedics;  Laterality: N/A;   BLADDER SURGERY  02/21/12 and 02/28/12   Dr Matilda   COLPOSCOPY  1980   CRYOTHERAPY  1980   CYSTO WITH HYDRODISTENSION  12/12/2011   Procedure: CYSTOSCOPY/HYDRODISTENSION;  Surgeon: Garnette Matilda, MD;  Location: [facility];  Service: Urology;  Laterality: N/A;  30 MIN    CYSTOSCOPY/URETEROSCOPY/HOLMIUM LASER/STENT PLACEMENT Right 06/08/2023   Procedure: CYSTOSCOPY/URETEROSCOPY/HOLMIUM LASER/STENT PLACEMENT;  Surgeon: Cam Morene ORN, MD;  Location: [facility];  Service: Urology;  Laterality: Right;  CYSTOSCOPY/RIGHT URETEROSCOPY/HOLMIUM LASER LITHOTRIPSY/STONE EXTRACTION/STENT PLACEMENT   EYE SURGERY  1997   bilateral cataract extraction   FOOT SURGERY     bilateral bunionettes   FOOT SURGERY Right 01/07/2014   Pt states surgery was  due to arthritis. Has pins in place now   FOREIGN BODY REMOVAL Right 09/27/2019   Procedure: REMOVAL FOREIGN BOD RIGHT FOOT;  Surgeon: Kay Kemps, MD;  Location: [facility];  Service: Orthopedics;  Laterality: Right;   FRACTURE SURGERY Left 2019   wrist   GASTRIC BYPASS  2005   JOINT REPLACEMENT  2008   left total knee   KNEE SURGERY  1997   left knee   LYMPH NODE DISSECTION Right 11/09/2018   Procedure: EXCISE DEEP RIGHT CERVICAL LYMPH NODE;  Surgeon: Gail Favorite, MD;  Location: [facility] OR;  Service: General;  Laterality: Right;   NASAL  SINUS SURGERY  1989   Dr Arlana   ORIF DISTAL RADIUS FRACTURE  03/2022   REFRACTIVE SURGERY  2006   lasik   REVERSE SHOULDER ARTHROPLASTY Left 09/27/2019   Procedure: REVERSE SHOULDER ARTHROPLASTY;  Surgeon: Kay Kemps, MD;  Location: [facility];  Service: Orthopedics;  Laterality: Left;  2 hrs General with intrascalene block   SHOULDER SURGERY  03/2010   RIGHT    STOMACH SURGERY  2007   tummy tuck   TOTAL KNEE ARTHROPLASTY Right 12/31/2015   Procedure: RIGHT TOTAL KNEE ARTHROPLASTY;  Surgeon: Reyes Billing, MD;  Location: [facility];  Service: Orthopedics;  Laterality: Right;  Adductor Block   VARICOSE VEIN SURGERY Right 02/2012   leg    Family History  Problem Relation Age of Onset   Hyperlipidemia Mother    Hypertension Mother    Arthritis Mother    Hypertension Father    Diabetes Father    Prostate cancer Father    Dementia Father    Lung cancer Father    Arthritis Brother    Testicular cancer Brother    Heart disease Maternal Grandmother    Hypertension Maternal Grandmother    Heart disease Maternal Grandfather    Hypertension Maternal Grandfather    Stroke Maternal Grandfather    Diabetes Paternal Grandfather    Stroke Paternal Grandfather    Colon cancer Neg Hx    Esophageal cancer Neg Hx    Pancreatic cancer Neg Hx    Stomach cancer Neg Hx     Social History   Socioeconomic History   Marital status: Married    Spouse name: Not on file   Number of children: 1   Years of education: Not on file   Highest education level: Not on file  Occupational History   Occupation: Event organiser: [employer]  Tobacco Use   Smoking status: Never   Smokeless tobacco: Never  Vaping Use   Vaping status: Never Used  Substance and Sexual Activity   Alcohol use: Not Currently    Comment: RARE   Drug use: No   Sexual activity: Yes    Comment: 1st intercourse 68 yo-More than 5 partners  Other Topics Concern   Not on file  Social History Narrative    Married    daughter (Bipolar/schizophrenia)   granddaughter   Retired at age 50   Social Drivers of Corporate investment banker Strain: Not on file  Food Insecurity: No Food Insecurity (06/30/2020)   Received from [facility]   Hunger Vital Sign    Within the past 12 months, you worried that your food would run out before you got the money to buy more.: Never true    Within the past 12 months, the food you bought just didn't last and you didn't have money to get more.: Never true  Transportation Needs: Not on file  Physical Activity: Not on file  Stress: Not on file  Social Connections: Unknown (06/17/2021)   [facility]   Social Network    Social Network: Not on file  Intimate Partner Violence: Unknown (05/21/2021)   Received from [facility]   HITS    Physically Hurt: Not on file    Insult or Talk Down To: Not on file    Threaten Physical Harm: Not on file    Scream or Curse: Not on file    Outpatient Medications Prior to Visit  Medication Sig Dispense Refill   acetaminophen  (TYLENOL ) 650 MG CR tablet Take 2 tablets (1,300 mg total) by mouth every 8 (eight) hours as needed for pain. Take for mild pain. Do not combine with Percocet. Do not exceed daily recommended limit of Tylenol .     albuterol  (VENTOLIN  HFA) 108 (90 Base) MCG/ACT inhaler Inhale 2 puffs into the lungs every 6 (six) hours as needed for wheezing or shortness of breath. 6.7 g 0   ALPRAZolam  (XANAX ) 1 MG tablet TAKE 1 TABLET (1 MG TOTAL) BY MOUTH 2 (TWO) TIMES DAILY AS NEEDED FOR ANXIETY AT BEDTIME AS NEEDED 30 tablet 0   Ascorbic Acid  (VITAMIN C) 1000 MG tablet Take 1,000 mg by mouth at bedtime.     busPIRone  (BUSPAR ) 15 MG tablet Take 1 tablet (15 mg total) by mouth 2 (two) times daily. 200 tablet 0   Calcium Carb-Cholecalciferol  (CALTRATE 600+D3 PO) Take 1 tablet by mouth in the morning and at bedtime.     Cholecalciferol  (VITAMIN D3) 75 MCG (3000 UT) TABS Take 1 tablet by mouth daily. 30 tablet     cyanocobalamin  (VITAMIN B12) 1000 MCG tablet Take 1 tablet (1,000 mcg total) by mouth daily.     diclofenac  Sodium (VOLTAREN ) 1 % GEL APPLY 2 G TOPICALLY 2 (TWO) TIMES DAILY AS NEEDED. 100 g 3   ferrous sulfate  325 (65 FE) MG tablet Take 325 mg by mouth daily.     fexofenadine (ALLEGRA) 180 MG tablet Take 180 mg by mouth at bedtime.     fluticasone  (FLONASE ) 50 MCG/ACT nasal spray Place 2 sprays into both nostrils daily. 48 mL 1   MAGNESIUM  PO Take 1 tablet by mouth daily.     Multiple Vitamins-Minerals (PRESERVISION AREDS 2) CAPS Take 1 tablet by mouth daily. (Patient taking differently: Take 1 tablet by mouth in the morning and at bedtime.)     MYRBETRIQ  50 MG TB24 tablet Take 50 mg by mouth at bedtime.   2   pantoprazole  (PROTONIX ) 40 MG tablet TAKE 1 TABLET BY MOUTH DAILY 90 tablet 1   PARoxetine  (PAXIL ) 40 MG tablet TAKE 1 TABLET BY MOUTH EVERY  MORNING 100 tablet 2   pilocarpine  (SALAGEN ) 5 MG tablet Take 1 tablet (5 mg total) by mouth 2 (two) times daily. 180 tablet 1   sodium chloride  (OCEAN) 0.65 % SOLN nasal spray Place 1-2 sprays into both nostrils 4 (four) times daily as needed for congestion.     Sodium Fluoride  (SODIUM FLUORIDE  5000 PPM) 1.1 % PSTE Use as directed. 100 mL 2   tirzepatide  (ZEPBOUND ) 2.5 MG/0.5ML Pen Inject 2.5 mg into the skin once a week. 2 mL 0   traMADol  (ULTRAM ) 50 MG tablet Take 1-2 tablets (50-100 mg total) by mouth every 6 (six) hours as needed for moderate pain (pain score 4-6). 15 tablet 0   valACYclovir  (VALTREX ) 500 MG tablet Take 1 tablet (500 mg total) by  mouth daily as needed. 90 tablet 0   Vitamin D , Ergocalciferol , (DRISDOL ) 1.25 MG (50000 UNIT) CAPS capsule TAKE 1 CAPSULE (50,000 UNITS TOTAL) BY MOUTH EVERY 7 (SEVEN) DAYS 12 capsule 1   zinc  gluconate 50 MG tablet Take 50 mg by mouth at bedtime.      fluconazole  (DIFLUCAN ) 150 MG tablet Take 1 tablet by mouth today, then may repeat in 3 days if symptoms persist. 2 tablet 0   tamsulosin (FLOMAX) 0.4  MG CAPS capsule Take 0.4 mg by mouth daily. (Patient not taking: Reported on 08/16/2023)     No facility-administered medications prior to visit.    Allergies  Allergen Reactions   Keflex  [Cephalexin ] Diarrhea   Nitrofurantoin Other (See Comments)    REACTION: fever   Ibuprofen  Nausea Only    REACTION: Post gastric bypass-->can not take due to surgery.  No allergy. REACTION: Post gastric bypass-->can not take due to surgery.  No allergy.    ROS See HPI    Objective:    Physical Exam Constitutional:      General: She is not in acute distress.    Appearance: Normal appearance. She is well-developed.  HENT:     Head: Normocephalic and atraumatic.     Right Ear: Tympanic membrane, ear canal and external ear normal.     Left Ear: Tympanic membrane, ear canal and external ear normal.     Nose:     Right Sinus: Maxillary sinus tenderness present. No frontal sinus tenderness.     Left Sinus: Maxillary sinus tenderness present. No frontal sinus tenderness.  Eyes:     General: No scleral icterus. Neck:     Thyroid : No thyromegaly.  Cardiovascular:     Rate and Rhythm: Normal rate and regular rhythm.     Heart sounds: Normal heart sounds. No murmur heard. Pulmonary:     Effort: Pulmonary effort is normal. No respiratory distress.     Breath sounds: Normal breath sounds. No wheezing.  Musculoskeletal:     Cervical back: Neck supple.  Skin:    General: Skin is warm and dry.  Neurological:     Mental Status: She is alert and oriented to person, place, and time.  Psychiatric:        Mood and Affect: Mood normal.        Behavior: Behavior normal.        Thought Content: Thought content normal.        Judgment: Judgment normal.      BP (!) 151/73 (BP Location: Right Arm, Patient Position: Sitting, Cuff Size: Normal)   Pulse 66   Temp 97.8 F (36.6 C) (Oral)   Resp 17   Ht 5' 4 (1.626 m)   Wt 222 lb 3.2 oz (100.8 kg)   SpO2 98%   BMI 38.14 kg/m  Wt Readings from Last 3  Encounters:  08/16/23 222 lb 3.2 oz (100.8 kg)  06/08/23 225 lb 3.2 oz (102.2 kg)  05/24/23 225 lb 3.2 oz (102.2 kg)       Assessment & Plan:   Problem List Items Addressed This Visit       Unprioritized   Sinusitis - Primary   History of Present Illness [name] [name] is a 68 year old female who presents with sinus pain and congestion.  She has experienced significant sinus pain and congestion in her cheeks and head for the past three days. The pain is severe, and Tylenol  and Mucinex have not alleviated her headache. Despite regular use  of Allegra and Flonase , she remains congested and has difficulty breathing, affecting her sleep. She has a history of sinus infections, with the last episode treated with antibiotics in December. Over the past two months, she has experienced allergy-related symptoms and has been using Mucinex daily.  Family History - Granddaughter had adenoids removed - Granddaughter had two sinus infections in the last month - Granddaughter had an ear infection with MRSA  Results RADIOLOGY Kidney ultrasound: Large kidney stone in bladder  DIAGNOSTIC Retinal examination: Ruptured retina in left eye (07/17/2023)  Assessment and Plan Sinusitis Recurrent sinusitis with severe symptoms unresponsive to current medications. Possible bacterial etiology considered. - Prescribe antibiotics for sinusitis. - Order CT of sinuses if no improvement in one week. - Consider ENT referral if CT indicates further evaluation.  Prone to yeast infections post-antibiotic use. Anticipated need for antifungal treatment due to current antibiotics. - Prescribe antifungal medication for yeast infection.      Relevant Medications   amoxicillin -clavulanate (AUGMENTIN ) 875-125 MG tablet   fluconazole  (DIFLUCAN ) 150 MG tablet   Acute vaginitis    I am having [name] [name] start on amoxicillin -clavulanate. I am also having her maintain her Myrbetriq , acetaminophen , zinc   gluconate, vitamin C, ferrous sulfate , fexofenadine, Calcium Carb-Cholecalciferol  (CALTRATE 600+D3 PO), sodium chloride , Vitamin D3, PreserVision AREDS 2, diclofenac  Sodium, albuterol , cyanocobalamin , Vitamin D  (Ergocalciferol ), fluticasone , Zepbound , PARoxetine , tamsulosin, MAGNESIUM  PO, traMADol , pantoprazole , pilocarpine , busPIRone , ALPRAZolam , valACYclovir , Sodium Fluoride  5000 PPM, and fluconazole .  Meds ordered this encounter  Medications   amoxicillin -clavulanate (AUGMENTIN ) 875-125 MG tablet    Sig: Take 1 tablet by mouth 2 (two) times daily.    Dispense:  14 tablet    Refill:  0    Supervising Provider:   DOMENICA BLACKBIRD A [[id number]]   fluconazole  (DIFLUCAN ) 150 MG tablet    Sig: Take 1 tablet by mouth today, then may repeat in 3 days if symptoms persist.    Dispense:  2 tablet    Refill:  0    Supervising Provider:   DOMENICA BLACKBIRD A [[id number]]

## 2023-08-16 NOTE — Assessment & Plan Note (Signed)
 History of Present Illness [name] [name] is a 68 year old female who presents with sinus pain and congestion.  She has experienced significant sinus pain and congestion in her cheeks and head for the past three days. The pain is severe, and Tylenol  and Mucinex have not alleviated her headache. Despite regular use of Allegra and Flonase , she remains congested and has difficulty breathing, affecting her sleep. She has a history of sinus infections, with the last episode treated with antibiotics in December. Over the past two months, she has experienced allergy-related symptoms and has been using Mucinex daily.  Family History - Granddaughter had adenoids removed - Granddaughter had two sinus infections in the last month - Granddaughter had an ear infection with MRSA  Results RADIOLOGY Kidney ultrasound: Large kidney stone in bladder  DIAGNOSTIC Retinal examination: Ruptured retina in left eye (07/17/2023)  Assessment and Plan Sinusitis Recurrent sinusitis with severe symptoms unresponsive to current medications. Possible bacterial etiology considered. - Prescribe antibiotics for sinusitis. - Order CT of sinuses if no improvement in one week. - Consider ENT referral if CT indicates further evaluation.  Prone to yeast infections post-antibiotic use. Anticipated need for antifungal treatment due to current antibiotics. - Prescribe antifungal medication for yeast infection.

## 2023-08-16 NOTE — Patient Instructions (Signed)
 VISIT SUMMARY:  During your visit, we addressed your severe sinus pain and congestion, which have not improved with over-the-counter medications. We also discussed your history of sinus infections and the potential for a yeast infection due to antibiotic use.  YOUR PLAN:  SINUSITIS: You have recurrent sinusitis with severe symptoms that have not improved with your current medications. This may be due to a bacterial infection. -We are prescribing antibiotics to treat your sinusitis. -If there is no improvement in one week, we will order a CT scan of your sinuses. -If the CT scan shows that further evaluation is needed, we will consider referring you to an ENT specialist.  YEAST INFECTION: You are prone to yeast infections after using antibiotics. -We are prescribing antifungal medication to prevent a yeast infection while you are on antibiotics.

## 2023-08-21 ENCOUNTER — Telehealth: Payer: Self-pay | Admitting: Family

## 2023-08-21 NOTE — Telephone Encounter (Signed)
 Could you please initiate Reclast  infusion after 7/25? Thanks

## 2023-08-22 ENCOUNTER — Telehealth: Payer: Self-pay

## 2023-08-22 ENCOUNTER — Encounter: Payer: Self-pay | Admitting: Family

## 2023-08-22 DIAGNOSIS — M19071 Primary osteoarthritis, right ankle and foot: Secondary | ICD-10-CM | POA: Diagnosis not present

## 2023-08-22 NOTE — Telephone Encounter (Signed)
 Melissa and Suzen, patient will be scheduled as soon as possible.  Auth Submission: NO AUTH NEEDED Site of care: Site of care: [facility] Payer: [employer] medicare Medication & CPT/J Code(s) submitted: Reclast  (Zolendronic acid) [id number] Diagnosis Code:  Route of submission (phone, fax, portal): portal Phone # Fax # Auth type: Buy/Bill PB Units/visits requested: 5mg  x 1 dose Reference number:  Approval from: 08/22/23 to 02/14/24

## 2023-08-22 NOTE — Telephone Encounter (Signed)
 Reclast  order entered.  Infusion center to contact patient.  Please schedule after 09/08/23.

## 2023-08-28 ENCOUNTER — Ambulatory Visit

## 2023-08-28 VITALS — BP 151/82 | HR 64 | Temp 98.5°F | Resp 16 | Ht 64.0 in | Wt 224.4 lb

## 2023-08-28 DIAGNOSIS — M810 Age-related osteoporosis without current pathological fracture: Secondary | ICD-10-CM

## 2023-08-28 MED ORDER — ZOLEDRONIC ACID 5 MG/100ML IV SOLN
5.0000 mg | Freq: Once | INTRAVENOUS | Status: AC
  Administered 2023-08-28: 5 mg via INTRAVENOUS
  Filled 2023-08-28: qty 100

## 2023-08-28 MED ORDER — ACETAMINOPHEN 325 MG PO TABS
650.0000 mg | ORAL_TABLET | Freq: Once | ORAL | Status: AC
  Administered 2023-08-28: 650 mg via ORAL
  Filled 2023-08-28: qty 2

## 2023-08-28 MED ORDER — DIPHENHYDRAMINE HCL 25 MG PO CAPS
25.0000 mg | ORAL_CAPSULE | Freq: Once | ORAL | Status: AC
  Administered 2023-08-28: 25 mg via ORAL
  Filled 2023-08-28: qty 1

## 2023-08-28 MED ORDER — SODIUM CHLORIDE 0.9 % IV SOLN: INTRAVENOUS | Status: DC

## 2023-08-28 NOTE — Progress Notes (Signed)
 Diagnosis: Osteoporosis  Provider:  Lonna Coder MD  Procedure: IV Infusion  IV Type: Peripheral, IV Location: L Hand  Reclast  (Zolendronic Acid), Dose: 5 mg  Infusion Start Time: 1043  Infusion Stop Time: 1115  Post Infusion IV Care: Observation period completed and Peripheral IV Discontinued  Discharge: Condition: Good, Destination: Home . AVS Declined  Performed by:  Trine Fread, RN

## 2023-08-31 ENCOUNTER — Encounter: Payer: Self-pay | Admitting: Family

## 2023-08-31 MED ORDER — ALPRAZOLAM 1 MG PO TABS: ORAL_TABLET | ORAL | 0 refills | Status: DC

## 2023-08-31 NOTE — Telephone Encounter (Signed)
 Requesting: alprazolam  1mg   Contract:11/17/22 UDS: 12/15/21 Last Visit: 08/16/23 Next Visit: None Last Refill: 07/31/23 #30 and 0RF   Please Advise

## 2023-09-28 ENCOUNTER — Other Ambulatory Visit: Payer: Self-pay | Admitting: Family

## 2023-09-28 DIAGNOSIS — N3000 Acute cystitis without hematuria: Secondary | ICD-10-CM | POA: Diagnosis not present

## 2023-09-28 DIAGNOSIS — N200 Calculus of kidney: Secondary | ICD-10-CM | POA: Diagnosis not present

## 2023-09-28 DIAGNOSIS — R3915 Urgency of urination: Secondary | ICD-10-CM | POA: Diagnosis not present

## 2023-09-28 DIAGNOSIS — R8271 Bacteriuria: Secondary | ICD-10-CM | POA: Diagnosis not present

## 2023-09-28 DIAGNOSIS — R350 Frequency of micturition: Secondary | ICD-10-CM | POA: Diagnosis not present

## 2023-10-02 ENCOUNTER — Encounter: Payer: Self-pay | Admitting: Family

## 2023-10-02 DIAGNOSIS — F419 Anxiety disorder, unspecified: Secondary | ICD-10-CM

## 2023-10-03 MED ORDER — ALPRAZOLAM 1 MG PO TABS: ORAL_TABLET | ORAL | 0 refills | Status: DC

## 2023-10-04 ENCOUNTER — Other Ambulatory Visit (INDEPENDENT_AMBULATORY_CARE_PROVIDER_SITE_OTHER)

## 2023-10-04 ENCOUNTER — Other Ambulatory Visit: Payer: Self-pay | Admitting: Family

## 2023-10-04 DIAGNOSIS — E559 Vitamin D deficiency, unspecified: Secondary | ICD-10-CM

## 2023-10-04 LAB — VITAMIN D 25 HYDROXY (VIT D DEFICIENCY, FRACTURES): VITD: 34.41 ng/mL (ref 30.00–100.00)

## 2023-10-04 NOTE — Progress Notes (Signed)
vit

## 2023-10-05 ENCOUNTER — Telehealth: Payer: Self-pay | Admitting: Family

## 2023-10-05 NOTE — Telephone Encounter (Signed)
 See mychart.

## 2023-10-07 ENCOUNTER — Other Ambulatory Visit: Payer: Self-pay | Admitting: Family

## 2023-10-11 DIAGNOSIS — H59812 Chorioretinal scars after surgery for detachment, left eye: Secondary | ICD-10-CM | POA: Diagnosis not present

## 2023-10-11 DIAGNOSIS — H47333 Pseudopapilledema of optic disc, bilateral: Secondary | ICD-10-CM | POA: Diagnosis not present

## 2023-10-11 DIAGNOSIS — H35363 Drusen (degenerative) of macula, bilateral: Secondary | ICD-10-CM | POA: Diagnosis not present

## 2023-10-11 DIAGNOSIS — H353131 Nonexudative age-related macular degeneration, bilateral, early dry stage: Secondary | ICD-10-CM | POA: Diagnosis not present

## 2023-10-17 ENCOUNTER — Other Ambulatory Visit: Payer: Self-pay | Admitting: Family

## 2023-10-22 ENCOUNTER — Other Ambulatory Visit: Payer: Self-pay | Admitting: Family

## 2023-10-23 DIAGNOSIS — M47812 Spondylosis without myelopathy or radiculopathy, cervical region: Secondary | ICD-10-CM | POA: Diagnosis not present

## 2023-10-24 ENCOUNTER — Ambulatory Visit (INDEPENDENT_AMBULATORY_CARE_PROVIDER_SITE_OTHER)

## 2023-10-24 VITALS — Ht 64.0 in | Wt 198.0 lb

## 2023-10-24 DIAGNOSIS — Z0000 Encounter for general adult medical examination without abnormal findings: Secondary | ICD-10-CM | POA: Diagnosis not present

## 2023-10-24 NOTE — Progress Notes (Signed)
 Subjective:   [name] [name] is a 68 y.o. who presents for a Medicare Wellness preventive visit.  As a reminder, Annual Wellness Visits don't include a physical exam, and some assessments may be limited, especially if this visit is performed virtually. We may recommend an in-person follow-up visit with your provider if needed.  Visit Complete: Virtual I connected with  [name] [name] on 10/24/23 by a audio enabled telemedicine application and verified that I am speaking with the correct person using two identifiers.  Patient Location: Home  Provider Location: Home Office  I discussed the limitations of evaluation and management by telemedicine. The patient expressed understanding and agreed to proceed.  Vital Signs: Because this visit was a virtual/telehealth visit, some criteria may be missing or patient reported. Any vitals not documented were not able to be obtained and vitals that have been documented are patient reported.    Persons Participating in Visit: Patient.  AWV Questionnaire: No: Patient Medicare AWV questionnaire was not completed prior to this visit.  Cardiac Risk Factors include: advanced age (>5men, >34 women);hypertension     Objective:    Today's Vitals   10/24/23 1053  Weight: 198 lb (89.8 kg)  Height: 5' 4 (1.626 m)   Body mass index is 33.99 kg/m.     10/24/2023   11:02 AM 06/08/2023    5:40 AM 05/24/2023   11:43 AM 11/22/2022    3:41 PM 08/15/2022   11:36 AM 12/25/2020   11:12 AM 09/27/2019    1:35 PM  Advanced Directives  Does Patient Have a Medical Advance Directive? Yes Yes Yes Yes No Yes Yes  Type of Estate agent of [place];Living will Healthcare Power of [place];Living will Living will;Healthcare Power of [employer] Power of [place];Living will   Healthcare Power of [place];Living will  Does patient want to make changes to medical advance directive?  No - Patient declined  No - Patient declined   No -  Patient declined  Copy of Healthcare Power of Attorney in Chart? No - copy requested No - copy requested  No - copy requested   No - copy requested    Current Medications (verified) Outpatient Encounter Medications as of 10/24/2023  Medication Sig   acetaminophen  (TYLENOL ) 650 MG CR tablet Take 2 tablets (1,300 mg total) by mouth every 8 (eight) hours as needed for pain. Take for mild pain. Do not combine with Percocet. Do not exceed daily recommended limit of Tylenol .   albuterol  (VENTOLIN  HFA) 108 (90 Base) MCG/ACT inhaler Inhale 2 puffs into the lungs every 6 (six) hours as needed for wheezing or shortness of breath.   ALPRAZolam  (XANAX ) 1 MG tablet TAKE 1 TABLET (1 MG TOTAL) BY MOUTH 2 (TWO) TIMES DAILY AS NEEDED FOR ANXIETY AT BEDTIME AS NEEDED   amoxicillin -clavulanate (AUGMENTIN ) 875-125 MG tablet Take 1 tablet by mouth 2 (two) times daily.   Ascorbic Acid  (VITAMIN C) 1000 MG tablet Take 1,000 mg by mouth at bedtime.   busPIRone  (BUSPAR ) 15 MG tablet TAKE 1 TABLET BY MOUTH TWICE  DAILY   Calcium Carb-Cholecalciferol  (CALTRATE 600+D3 PO) Take 1 tablet by mouth in the morning and at bedtime.   Cholecalciferol  (VITAMIN D3) 75 MCG (3000 UT) TABS Take 1 tablet by mouth daily.   cyanocobalamin  (VITAMIN B12) 1000 MCG tablet Take 1 tablet (1,000 mcg total) by mouth daily.   diclofenac  Sodium (VOLTAREN ) 1 % GEL APPLY 2 G TOPICALLY 2 (TWO) TIMES DAILY AS NEEDED.   ferrous sulfate  325 (  65 FE) MG tablet Take 325 mg by mouth daily.   fexofenadine (ALLEGRA) 180 MG tablet Take 180 mg by mouth at bedtime.   fluconazole  (DIFLUCAN ) 150 MG tablet Take 1 tablet by mouth today, then may repeat in 3 days if symptoms persist.   fluticasone  (FLONASE ) 50 MCG/ACT nasal spray SPRAY 2 SPRAYS INTO EACH NOSTRIL EVERY DAY   MAGNESIUM  PO Take 1 tablet by mouth daily.   Multiple Vitamins-Minerals (PRESERVISION AREDS 2) CAPS Take 1 tablet by mouth daily. (Patient taking differently: Take 1 tablet by mouth in the morning  and at bedtime.)   MYRBETRIQ  50 MG TB24 tablet Take 50 mg by mouth at bedtime.    pantoprazole  (PROTONIX ) 40 MG tablet TAKE 1 TABLET BY MOUTH DAILY   PARoxetine  (PAXIL ) 40 MG tablet TAKE 1 TABLET BY MOUTH EVERY  MORNING   pilocarpine  (SALAGEN ) 5 MG tablet Take 1 tablet (5 mg total) by mouth 2 (two) times daily.   sodium chloride  (OCEAN) 0.65 % SOLN nasal spray Place 1-2 sprays into both nostrils 4 (four) times daily as needed for congestion.   SODIUM FLUORIDE  5000 PPM 1.1 % PSTE USE AS DIRECTED   tamsulosin (FLOMAX) 0.4 MG CAPS capsule Take 0.4 mg by mouth daily. (Patient not taking: Reported on 08/16/2023)   tirzepatide  (ZEPBOUND ) 2.5 MG/0.5ML Pen Inject 2.5 mg into the skin once a week.   traMADol  (ULTRAM ) 50 MG tablet Take 1-2 tablets (50-100 mg total) by mouth every 6 (six) hours as needed for moderate pain (pain score 4-6).   valACYclovir  (VALTREX ) 500 MG tablet Take 1 tablet (500 mg total) by mouth daily as needed.   Vitamin D , Ergocalciferol , (DRISDOL ) 1.25 MG (50000 UNIT) CAPS capsule TAKE 1 CAPSULE (50,000 UNITS TOTAL) BY MOUTH EVERY 7 (SEVEN) DAYS   zinc  gluconate 50 MG tablet Take 50 mg by mouth at bedtime.    No facility-administered encounter medications on file as of 10/24/2023.    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 Tobie Retinal specialist in [place]   Maxillary sinusitis 07/24/2020   Neuromuscular disorder (HCC)    rt arm carpal tunnel syndrome   Obesity    status post bariatric procedure in [place] 2005   Osteoporosis    Osteoporosis 08/21/2008   Sarcoidosis of lung (HCC)    Thyroid  nodule 08/2008   `   Thyroid  nodule    Varicose vein of leg    Past Surgical History:  Procedure Laterality Date   ANTERIOR CERVICAL DECOMP/DISCECTOMY FUSION N/A 06/21/2012   Procedure: ANTERIOR CERVICAL DECOMPRESSION/DISCECTOMY FUSION C-4 - C5 (SPACER/DePUY CERVICAL PLATE ONLY) 1 LEVEL;  Surgeon: Donaciano Sprang, MD;  Location: [facility] OR;  Service: Orthopedics;  Laterality: N/A;  BLADDER SURGERY  02/21/12 and 02/28/12   Dr Matilda   COLPOSCOPY  1980   CRYOTHERAPY  1980   CYSTO WITH HYDRODISTENSION  12/12/2011   Procedure: CYSTOSCOPY/HYDRODISTENSION;  Surgeon: Garnette Matilda, MD;  [facility];  Service: Urology;  Laterality: N/A;  30 MIN    CYSTOSCOPY/URETEROSCOPY/HOLMIUM LASER/STENT PLACEMENT Right 06/08/2023   Procedure: CYSTOSCOPY/URETEROSCOPY/HOLMIUM LASER/STENT PLACEMENT;  Surgeon: Cam Morene ORN, MD;  Location: [facility];  Service: Urology;  Laterality: Right;  CYSTOSCOPY/RIGHT URETEROSCOPY/HOLMIUM LASER LITHOTRIPSY/STONE EXTRACTION/STENT PLACEMENT   EYE SURGERY  1997   bilateral cataract extraction   FOOT SURGERY     bilateral bunionettes   FOOT SURGERY Right 01/07/2014   Pt states surgery was due to arthritis. Has pins in place now   FOREIGN BODY REMOVAL Right 09/27/2019   Procedure: REMOVAL FOREIGN BOD RIGHT FOOT;  Surgeon: Kay Kemps, MD;  Location: [facility];  Service: Orthopedics;  Laterality: Right;   FRACTURE SURGERY Left 2019   wrist   GASTRIC BYPASS  2005   JOINT REPLACEMENT  2008   left total knee   KNEE SURGERY  1997   left knee   LYMPH NODE DISSECTION Right 11/09/2018   Procedure: EXCISE DEEP RIGHT CERVICAL LYMPH NODE;   Surgeon: Gail Favorite, MD;  Location[facility] OR;  Service: General;  Laterality: Right;   NASAL SINUS SURGERY  1989   Dr Arlana   ORIF DISTAL RADIUS FRACTURE  03/2022   REFRACTIVE SURGERY  2006   lasik   REVERSE SHOULDER ARTHROPLASTY Left 09/27/2019   Procedure: REVERSE SHOULDER ARTHROPLASTY;  Surgeon: Kay Kemps, MD;  Location: [facility];  Service: Orthopedics;  Laterality: Left;  2 hrs General with intrascalene block   SHOULDER SURGERY  03/2010   RIGHT    STOMACH SURGERY  2007   tummy tuck   TOTAL KNEE ARTHROPLASTY Right 12/31/2015   Procedure: RIGHT TOTAL KNEE ARTHROPLASTY;  Surgeon: Reyes Billing, MD;  Location: [facility];  Service: Orthopedics;  Laterality: Right;  Adductor Block   VARICOSE VEIN SURGERY Right 02/2012   leg   Family History  Problem Relation Age of Onset   Hyperlipidemia Mother    Hypertension Mother    Arthritis Mother    Hypertension Father    Diabetes Father    Prostate cancer Father    Dementia Father    Lung cancer Father    Arthritis Brother    Testicular cancer Brother    Heart disease Maternal Grandmother    Hypertension Maternal Grandmother    Heart disease Maternal Grandfather    Hypertension Maternal Grandfather    Stroke Maternal Grandfather    Diabetes Paternal Grandfather    Stroke Paternal Grandfather    Colon cancer Neg Hx    Esophageal cancer Neg Hx    Pancreatic cancer Neg Hx    Stomach cancer Neg Hx    Social History   Socioeconomic History   Marital status: Married    Spouse name: Not on file   Number of children: 1   Years of education: Not on file   Highest education level: Not on file  Occupational History   Occupation: Event organiser: [employer]  Tobacco Use   Smoking status: Never   Smokeless tobacco: Never  Vaping Use   Vaping status: Never Used  Substance and Sexual Activity   Alcohol use: Not Currently    Comment: RARE   Drug use: No   Sexual activity: Yes    Comment: 1st intercourse 68  yo-More than 5 partners  Other Topics Concern   Not on file  Social History Narrative   Married    daughter (Bipolar/schizophrenia)   granddaughter   Retired at age 41   Social Drivers of Health   Financial Resource Strain: Low Risk  (10/24/2023)   Overall Financial Resource Strain (CARDIA)    Difficulty of Paying Living Expenses: Not hard at all  Food Insecurity: No Food Insecurity (10/24/2023)   Hunger Vital Sign    Worried About Running Out of Food in the Last Year: Never true    Ran Out of Food in the Last Year: Never true  Transportation Needs: No Transportation Needs (10/24/2023)   PRAPARE - Administrator, Civil Service (Medical): No    Lack of Transportation (Non-Medical): No  Physical Activity: Sufficiently Active (10/24/2023)   Exercise Vital Sign    Days of Exercise per Week: 3 days    Minutes of Exercise per Session: 60 min  Recent Concern: Physical Activity - Inactive (10/24/2023)   Exercise Vital Sign    Days of Exercise per Week: 0 days    Minutes of Exercise per Session: 0 min  Stress: No Stress Concern Present (10/24/2023)   [employer] of Occupational Health - Occupational Stress Questionnaire    Feeling of Stress: Not at all  Social Connections: Socially Integrated (10/24/2023)   Social Connection and Isolation Panel    Frequency of Communication with Friends and Family: More than three times a week    Frequency of Social Gatherings with Friends and Family: More than three times a week    Attends Religious Services: More than 4 times per year    Active Member of [employer] or Organizations: Yes    Attends Engineer, structural: More than 4 times per year    Marital Status: Married    Tobacco Counseling Counseling given: Not Answered    Clinical Intake:  Pre-visit preparation completed: Yes  Pain : No/denies pain     BMI - recorded: 33.99 Nutritional Status: BMI > 30  Obese Nutritional Risks: None Diabetes: No  Lab Results   Component Value Date   HGBA1C 5.7 09/18/2017   HGBA1C 5.6 01/04/2013     How often do you need to have someone help you when you read instructions, pamphlets, or other written materials from your doctor or pharmacy?: 1 - Never  Interpreter Needed?: No  Information entered by :: Rojelio Blush LPN   Activities of Daily Living     10/24/2023   11:00 AM 05/24/2023   11:45 AM  In your present state of health, do you have any difficulty performing the following activities:  Hearing? 0   Vision? 0   Difficulty concentrating or making decisions? 0   Walking or climbing stairs? 0   Dressing or bathing? 0   Doing errands, shopping? 0 0  Preparing Food and eating ? N   Using the Toilet? N   In the past six months, have you accidently leaked urine? Y   Comment Incontient followed Urologist   Do you have problems with loss of bowel control? N   Managing your Medications? N   Managing your Finances? N   Housekeeping or managing your Housekeeping? N     Patient Care Team: Daryl Setter, NP as PCP - General (Internal Medicine) Mai Lynwood FALCON, MD as Consulting Physician (Rheumatology)  I have updated your Care Teams any recent Medical Services you may have received from other providers in the past  year.     Assessment:   This is a routine wellness examination for Biannca.  Hearing/Vision screen Hearing Screening - Comments:: Denies hearing difficulties   Vision Screening - Comments:: Wears rx glasses - up to date with routine eye exams with  Dr Kenney   Goals Addressed               This Visit's Progress     Increase physical activity (pt-stated)        Remain active and lose weight.       Depression Screen     10/24/2023   10:58 AM 01/10/2023    8:43 AM 10/21/2022    1:24 PM 09/30/2022    3:21 PM 04/15/2020   12:13 PM 01/16/2017    9:18 AM 12/16/2015    8:43 AM  PHQ 2/9 Scores  PHQ - 2 Score 0 0 0 0 0 0 0  PHQ- 9 Score  0 4   0     Fall Risk     10/24/2023    11:01 AM 01/10/2023    8:43 AM 10/21/2022    1:23 PM  Fall Risk   Falls in the past year? 1 1 0  Number falls in past yr: 0 0 0  Injury with Fall? 0 0 0  Risk for fall due to : No Fall Risks No Fall Risks No Fall Risks  Follow up Falls evaluation completed Falls evaluation completed Falls evaluation completed    MEDICARE RISK AT HOME:  Medicare Risk at Home Any stairs in or around the home?: Yes If so, are there any without handrails?: No Home free of loose throw rugs in walkways, pet beds, electrical cords, etc?: Yes Adequate lighting in your home to reduce risk of falls?: Yes Life alert?: No Use of a cane, walker or w/c?: No Grab bars in the bathroom?: Yes Shower chair or bench in shower?: No Elevated toilet seat or a handicapped toilet?: No  TIMED UP AND GO:  Was the test performed?  No  Cognitive Function: 6CIT completed        10/24/2023   11:02 AM  6CIT Screen  What Year? 0 points  What month? 0 points  What time? 0 points  Count back from 20 0 points  Months in reverse 0 points  Repeat phrase 0 points  Total Score 0 points    Immunizations Immunization History  Administered Date(s) Administered   Fluad Quad(high Dose 65+) 12/15/2021   Fluad Trivalent(High Dose 65+) 10/21/2022   H1N1 02/18/2008   Influenza Split 11/15/2011   Influenza Whole 11/25/2008, 11/12/2009   Influenza,inj,Quad PF,6+ Mos 11/09/2015, 10/25/2017, 12/14/2020   Influenza,inj,Quad PF,6-35 Mos 10/16/2018   Influenza-Unspecified 10/16/2014, 10/29/2016, 11/29/2019   Moderna Sars-Covid-2 Vaccination 06/07/2019, 07/05/2019   PNEUMOCOCCAL CONJUGATE-20 12/14/2020   Pfizer(Comirnaty )Fall Seasonal Vaccine 12 years and older 01/04/2022   Respiratory Syncytial Virus Vaccine ,Recomb Aduvanted(Arexvy ) 02/16/2022   Td 09/02/2009, 02/15/2012   Tdap 01/04/2022   Zoster Recombinant(Shingrix) 09/28/2016, 03/17/2017    Screening Tests Health Maintenance  Topic Date Due   Influenza Vaccine  09/15/2023    COVID-19 Vaccine (4 - 2025-26 season) 10/16/2023   Medicare Annual Wellness (AWV)  10/23/2024   MAMMOGRAM  01/22/2025   Colonoscopy  12/31/2026   DTaP/Tdap/Td (4 - Td or Tdap) 01/05/2032   Pneumococcal Vaccine: 50+ Years  Completed   DEXA SCAN  Completed   Hepatitis C Screening  Completed   Zoster Vaccines- Shingrix  Completed   HPV VACCINES  Aged Out  Meningococcal B Vaccine  Aged Out    Health Maintenance Items Addressed:   Additional Screening:  Vision Screening: Recommended annual ophthalmology exams for early detection of glaucoma and other disorders of the eye. Is the patient up to date with their annual eye exam?  Yes Who is the provider or what is the name of the office in which the patient attends annual eye exams? Dr Kenney  Dental Screening: Recommended annual dental exams for proper oral hygiene  Community Resource Referral / Chronic Care Management: CRR required this visit?  No   CCM required this visit?  No   Plan:    I have personally reviewed and noted the following in the patient's chart:   Medical and social history Use of alcohol, tobacco or illicit drugs  Current medications and supplements including opioid prescriptions. Patient is not currently taking opioid prescriptions. Functional ability and status Nutritional status Physical activity Advanced directives List of other physicians Hospitalizations, surgeries, and ER visits in previous 12 months Vitals Screenings to include cognitive, depression, and falls Referrals and appointments  In addition, I have reviewed and discussed with patient certain preventive protocols, quality metrics, and best practice recommendations. A written personalized care plan for preventive services as well as general preventive health recommendations were provided to patient.   Rojelio LELON Blush, LPN   [id number]   After Visit Summary: (MyChart) Due to this being a telephonic visit, the after visit summary with  patients personalized plan was offered to patient via MyChart   Notes: Nothing significant to report at this time.

## 2023-10-24 NOTE — Patient Instructions (Addendum)
 [name] [name],  Thank you for taking the time for your Medicare Wellness Visit. I appreciate your continued commitment to your health goals. Please review the care plan we discussed, and feel free to reach out if I can assist you further.  Medicare recommends these wellness visits once per year to help you and your care team stay ahead of potential health issues. These visits are designed to focus on prevention, allowing your provider to concentrate on managing your acute and chronic conditions during your regular appointments.  Please note that Annual Wellness Visits do not include a physical exam. Some assessments may be limited, especially if the visit was conducted virtually. If needed, we may recommend a separate in-person follow-up with your provider.  Ongoing Care Seeing your primary care provider every 3 to 6 months helps us  monitor your health and provide consistent, personalized care.   Referrals If a referral was made during today's visit and you haven't received any updates within two weeks, please contact the referred provider directly to check on the status.  Recommended Screenings:  Health Maintenance  Topic Date Due   Flu Shot  09/15/2023   COVID-19 Vaccine (4 - 2025-26 season) 10/16/2023   Medicare Annual Wellness Visit  10/23/2024   Mammogram  01/22/2025   Colon Cancer Screening  12/31/2026   DTaP/Tdap/Td vaccine (4 - Td or Tdap) 01/05/2032   Pneumococcal Vaccine for age over 79  Completed   DEXA scan (bone density measurement)  Completed   Hepatitis C Screening  Completed   Zoster (Shingles) Vaccine  Completed   HPV Vaccine  Aged Out   Meningitis B Vaccine  Aged Out       10/24/2023   11:02 AM  Advanced Directives  Does Patient Have a Medical Advance Directive? Yes  Type of Estate agent of [place];Living will  Copy of Healthcare Power of Attorney in Chart? No - copy requested   Advance Care Planning is important because it: Ensures you  receive medical care that aligns with your values, goals, and preferences. Provides guidance to your family and loved ones, reducing the emotional burden of decision-making during critical moments.  Vision: Annual vision screenings are recommended for early detection of glaucoma, cataracts, and diabetic retinopathy. These exams can also reveal signs of chronic conditions such as diabetes and high blood pressure.  Dental: Annual dental screenings help detect early signs of oral cancer, gum disease, and other conditions linked to overall health, including heart disease and diabetes.  Please see the attached documents for additional preventive care recommendations.

## 2023-10-25 ENCOUNTER — Other Ambulatory Visit (HOSPITAL_BASED_OUTPATIENT_CLINIC_OR_DEPARTMENT_OTHER): Payer: Self-pay

## 2023-10-25 ENCOUNTER — Ambulatory Visit (INDEPENDENT_AMBULATORY_CARE_PROVIDER_SITE_OTHER): Admitting: Family Medicine

## 2023-10-25 ENCOUNTER — Encounter: Payer: Self-pay | Admitting: Family Medicine

## 2023-10-25 VITALS — BP 122/78 | HR 56 | Temp 98.0°F | Resp 18 | Ht 64.0 in | Wt 199.6 lb

## 2023-10-25 DIAGNOSIS — J0111 Acute recurrent frontal sinusitis: Secondary | ICD-10-CM

## 2023-10-25 LAB — POC COVID19 BINAXNOW: SARS Coronavirus 2 Ag: NEGATIVE

## 2023-10-25 MED ORDER — AZELASTINE HCL 0.1 % NA SOLN
2.0000 | Freq: Two times a day (BID) | NASAL | 11 refills | Status: DC
  Filled 2023-10-25: qty 30, 50d supply, fill #0

## 2023-10-25 MED ORDER — FLUCONAZOLE 150 MG PO TABS
ORAL_TABLET | ORAL | 0 refills | Status: DC
  Filled 2023-10-25: qty 2, 6d supply, fill #0

## 2023-10-25 MED ORDER — DOXYCYCLINE HYCLATE 100 MG PO TABS
100.0000 mg | ORAL_TABLET | Freq: Two times a day (BID) | ORAL | 0 refills | Status: AC
  Filled 2023-10-25: qty 14, 7d supply, fill #0

## 2023-10-25 NOTE — Progress Notes (Signed)
 Acute Office Visit  Subjective:     Patient ID: [name] [name], female    DOB: 1955-08-18, 68 y.o.   MRN: [id number]  Chief Complaint  Patient presents with   Sinusitis    Sinusitis   Patient is in today for sinusitis.   Discussed the use of AI scribe software for clinical note transcription with the patient, who gave verbal consent to proceed.  History of Present Illness [name] [name] is a 68 year old female who presents with sinus congestion and headache following a recent cold.  She has been experiencing significant sinus congestion and headache following a recent cold. Initially, she caught a cold/virus a few months ago, which lasted for several weeks. Her symptoms included a full head and significant mucus production, for which she took Mucinex for four to six weeks. Recently, her symptoms evolved to include a major headache and sinus pressure, particularly when bending over, which almost caused her to lose balance. The headache is frontal and bilateral and described as a major headache with pressure, but without fever or drainage. She has been using Mucinex and Tylenol  with variable relief.  She has a history of allergies and has been using nasal sprays like Flonase  and Nasacort since high school. No fever, sore throat, or drainage into her breathing or coughing. Her ears feel pressure but do not hurt. She was treated with Augmentin  in July for a similar episode.           ROS All review of systems negative except what is listed in the HPI      Objective:    BP 122/78   Pulse (!) 56   Temp 98 F (36.7 C)   Resp 18   Ht 5' 4 (1.626 m)   Wt 199 lb 9.6 oz (90.5 kg)   SpO2 98%   BMI 34.26 kg/m    Physical Exam Vitals reviewed.  Constitutional:      Appearance: Normal appearance.  HENT:     Right Ear: Tympanic membrane normal.     Left Ear: Tympanic membrane normal.     Nose:     Right Sinus: Frontal sinus tenderness present.     Left Sinus:  Frontal sinus tenderness present.  Cardiovascular:     Rate and Rhythm: Normal rate and regular rhythm.     Heart sounds: Normal heart sounds.  Pulmonary:     Effort: Pulmonary effort is normal.     Breath sounds: Normal breath sounds.  Skin:    General: Skin is warm and dry.  Neurological:     Mental Status: She is alert and oriented to person, place, and time.  Psychiatric:        Mood and Affect: Mood normal.        Behavior: Behavior normal.        Thought Content: Thought content normal.        Judgment: Judgment normal.        Results for orders placed or performed in visit on 10/25/23  POC COVID-19 BinaxNow  Result Value Ref Range   SARS Coronavirus 2 Ag Negative Negative        Assessment & Plan:   Problem List Items Addressed This Visit       Active Problems   Sinusitis - Primary   Relevant Medications   doxycycline  (VIBRA -TABS) 100 MG tablet   fluconazole  (DIFLUCAN ) 150 MG tablet   azelastine  (ASTELIN ) 0.1 % nasal spray   Other Relevant Orders  POC COVID-19 BinaxNow (Completed)    Assessment & Plan Acute recurrent frontal sinusitis Recurrent sinusitis with headache, sinus pressure, and congestion. Negative COVID-19. Opted for doxycycline  since Augmentin  was used recently. - Prescribe doxycycline . - Advise warm compresses, acetaminophen , and rest. - Recommend continued use of Flonase  or Nasacort. - Suggest Astelin  nasal spray. - Instruct to avoid multivitamins or iron  within two hours of antibiotic. - Patient aware of signs/symptoms requiring further/urgent evaluation.       Meds ordered this encounter  Medications   doxycycline  (VIBRA -TABS) 100 MG tablet    Sig: Take 1 tablet (100 mg total) by mouth 2 (two) times daily for 7 days.    Dispense:  14 tablet    Refill:  0    Supervising Provider:   DOMENICA BLACKBIRD A [[id number]]   fluconazole  (DIFLUCAN ) 150 MG tablet    Sig: Take 1 tablet by mouth today, then may repeat in 3 days if symptoms persist.     Dispense:  2 tablet    Refill:  0    Supervising Provider:   DOMENICA BLACKBIRD A [[id number]]   azelastine  (ASTELIN ) 0.1 % nasal spray    Sig: Place 2 sprays into both nostrils 2 (two) times daily. Use in each nostril as directed    Dispense:  30 mL    Refill:  11    Use generic Astelin     Supervising Provider:   DOMENICA BLACKBIRD A [[id number]]    Return if symptoms worsen or fail to improve.  [name] [name] Mon, NP

## 2023-10-26 ENCOUNTER — Encounter: Payer: Self-pay | Admitting: Family

## 2023-10-26 MED ORDER — TIRZEPATIDE-WEIGHT MANAGEMENT 5 MG/0.5ML ~~LOC~~ SOLN: 5.0000 mg | SUBCUTANEOUS | 1 refills | Status: DC

## 2023-10-26 NOTE — Addendum Note (Signed)
 Addended by: DARYL SETTER on: 10/26/2023 05:11 PM   Modules accepted: Orders

## 2023-10-31 ENCOUNTER — Encounter: Payer: Self-pay | Admitting: Family

## 2023-10-31 DIAGNOSIS — F419 Anxiety disorder, unspecified: Secondary | ICD-10-CM

## 2023-10-31 MED ORDER — ALPRAZOLAM 1 MG PO TABS
ORAL_TABLET | ORAL | 0 refills | Status: DC
Start: 2023-10-31 — End: 2023-11-30

## 2023-10-31 NOTE — Telephone Encounter (Signed)
 Requesting: alprazolam  1mg  Contract:11/17/22 UDS: 12/15/21 Last Visit:08/16/23 Next Visit: None Last Refill: 10/03/23 #30 and 0RF  Please Advise

## 2023-11-10 DIAGNOSIS — G8929 Other chronic pain: Secondary | ICD-10-CM | POA: Diagnosis not present

## 2023-11-10 DIAGNOSIS — G5681 Other specified mononeuropathies of right upper limb: Secondary | ICD-10-CM | POA: Diagnosis not present

## 2023-11-10 DIAGNOSIS — M25511 Pain in right shoulder: Secondary | ICD-10-CM | POA: Diagnosis not present

## 2023-11-15 DIAGNOSIS — G44219 Episodic tension-type headache, not intractable: Secondary | ICD-10-CM | POA: Diagnosis not present

## 2023-11-24 ENCOUNTER — Other Ambulatory Visit: Payer: Self-pay | Admitting: Family

## 2023-11-24 DIAGNOSIS — M47812 Spondylosis without myelopathy or radiculopathy, cervical region: Secondary | ICD-10-CM | POA: Diagnosis not present

## 2023-11-30 ENCOUNTER — Encounter: Payer: Self-pay | Admitting: Family

## 2023-11-30 DIAGNOSIS — F419 Anxiety disorder, unspecified: Secondary | ICD-10-CM

## 2023-11-30 MED ORDER — ALPRAZOLAM 1 MG PO TABS: ORAL_TABLET | ORAL | 0 refills | Status: DC

## 2023-11-30 NOTE — Telephone Encounter (Signed)
 Requesting: alprazolam  1mg  Contract:12/15/21 UDS: 12/15/21 Last Visit: 08/16/23 Next Visit: None Last Refill: 10/31/23 #30 and 0RF   Please Advise

## 2023-12-08 ENCOUNTER — Encounter: Payer: Self-pay | Admitting: Family

## 2023-12-10 ENCOUNTER — Other Ambulatory Visit: Payer: Self-pay | Admitting: Family

## 2023-12-10 MED ORDER — TIRZEPATIDE-WEIGHT MANAGEMENT 7.5 MG/0.5ML ~~LOC~~ SOLN: 7.5000 mg | SUBCUTANEOUS | 0 refills | Status: DC

## 2023-12-13 DIAGNOSIS — G5681 Other specified mononeuropathies of right upper limb: Secondary | ICD-10-CM | POA: Diagnosis not present

## 2023-12-23 ENCOUNTER — Other Ambulatory Visit: Payer: Self-pay | Admitting: Family

## 2023-12-31 ENCOUNTER — Encounter: Payer: Self-pay | Admitting: Family

## 2023-12-31 DIAGNOSIS — F419 Anxiety disorder, unspecified: Secondary | ICD-10-CM

## 2024-01-01 MED ORDER — ALPRAZOLAM 1 MG PO TABS: ORAL_TABLET | ORAL | 0 refills | Status: DC

## 2024-01-01 NOTE — Telephone Encounter (Signed)
 Requesting: alprazolam  1mg  Contract:11/17/22 UDS: 12/15/21 Last Visit: 08/16/23 Next Visit: None Last Refill:11/30/23 #30 and 0RF   Please Advise

## 2024-01-02 ENCOUNTER — Other Ambulatory Visit: Payer: Self-pay | Admitting: Family

## 2024-01-03 ENCOUNTER — Other Ambulatory Visit (HOSPITAL_BASED_OUTPATIENT_CLINIC_OR_DEPARTMENT_OTHER): Payer: Self-pay

## 2024-01-03 MED ORDER — FLUZONE HIGH-DOSE 0.5 ML IM SUSY
0.5000 mL | PREFILLED_SYRINGE | Freq: Once | INTRAMUSCULAR | 0 refills | Status: AC
  Filled 2024-01-03: qty 0.5, 1d supply, fill #0

## 2024-01-08 ENCOUNTER — Other Ambulatory Visit: Payer: Self-pay | Admitting: Family

## 2024-01-18 ENCOUNTER — Encounter: Payer: Self-pay | Admitting: Family

## 2024-01-18 MED ORDER — TIRZEPATIDE-WEIGHT MANAGEMENT 10 MG/0.5ML ~~LOC~~ SOLN: 10.0000 mg | SUBCUTANEOUS | 1 refills | Status: DC

## 2024-01-26 ENCOUNTER — Other Ambulatory Visit (HOSPITAL_BASED_OUTPATIENT_CLINIC_OR_DEPARTMENT_OTHER): Payer: Self-pay | Admitting: Family

## 2024-01-26 DIAGNOSIS — Z1231 Encounter for screening mammogram for malignant neoplasm of breast: Secondary | ICD-10-CM

## 2024-01-30 ENCOUNTER — Encounter: Payer: Self-pay | Admitting: Family

## 2024-01-30 DIAGNOSIS — F419 Anxiety disorder, unspecified: Secondary | ICD-10-CM

## 2024-01-30 MED ORDER — ALPRAZOLAM 1 MG PO TABS: ORAL_TABLET | ORAL | 0 refills | Status: DC

## 2024-01-30 NOTE — Telephone Encounter (Signed)
 Requesting: alprazolam  1mg  Contract:11/17/22 UDS:12/15/21 Last Visit: 08/16/23 Next Visit: 03/13/24 Last Refill: 01/01/24 #30 and 0RF   Please Advise

## 2024-02-01 ENCOUNTER — Inpatient Hospital Stay (HOSPITAL_BASED_OUTPATIENT_CLINIC_OR_DEPARTMENT_OTHER): Admission: RE | Admit: 2024-02-01

## 2024-02-26 ENCOUNTER — Encounter (HOSPITAL_BASED_OUTPATIENT_CLINIC_OR_DEPARTMENT_OTHER): Payer: Self-pay

## 2024-02-26 ENCOUNTER — Ambulatory Visit (HOSPITAL_BASED_OUTPATIENT_CLINIC_OR_DEPARTMENT_OTHER)
Admission: RE | Admit: 2024-02-26 | Discharge: 2024-02-26 | Disposition: A | Discharge status: Home / Self Care | Source: Ambulatory Visit | Attending: Family | Admitting: Family

## 2024-02-26 DIAGNOSIS — Z1231 Encounter for screening mammogram for malignant neoplasm of breast: Secondary | ICD-10-CM | POA: Insufficient documentation

## 2024-02-27 ENCOUNTER — Encounter: Payer: Self-pay | Admitting: Family

## 2024-02-27 DIAGNOSIS — Z87442 Personal history of urinary calculi: Secondary | ICD-10-CM

## 2024-02-27 DIAGNOSIS — H579 Unspecified disorder of eye and adnexa: Secondary | ICD-10-CM

## 2024-02-29 ENCOUNTER — Ambulatory Visit: Payer: Self-pay | Admitting: Family

## 2024-03-01 ENCOUNTER — Encounter: Payer: Self-pay | Admitting: Family

## 2024-03-01 DIAGNOSIS — F419 Anxiety disorder, unspecified: Secondary | ICD-10-CM

## 2024-03-01 MED ORDER — ALPRAZOLAM 1 MG PO TABS: ORAL_TABLET | ORAL | 0 refills | Status: AC

## 2024-03-01 NOTE — Telephone Encounter (Signed)
 Requesting: alprazolam  1mg  Contract:12/15/21 UDS:12/15/21 Last Visit: 08/16/23 Next Visit:03/13/24 Last Refill: 01/30/24 #30 and 0RF  Please Advise

## 2024-03-04 ENCOUNTER — Telehealth: Payer: Self-pay | Admitting: *Deleted

## 2024-03-04 NOTE — Telephone Encounter (Signed)
 Left patient a message to call and schedule pap only, not an annual due to having Medicare per Dr. Cleatus.

## 2024-03-05 ENCOUNTER — Encounter: Payer: Self-pay | Admitting: Family

## 2024-03-05 DIAGNOSIS — Z8639 Personal history of other endocrine, nutritional and metabolic disease: Secondary | ICD-10-CM

## 2024-03-06 ENCOUNTER — Other Ambulatory Visit: Payer: Self-pay | Admitting: Family

## 2024-03-06 MED ORDER — TIRZEPATIDE-WEIGHT MANAGEMENT 12.5 MG/0.5ML ~~LOC~~ SOLN: 12.5000 mg | SUBCUTANEOUS | 0 refills | Status: DC

## 2024-03-06 MED ORDER — TIRZEPATIDE-WEIGHT MANAGEMENT 12.5 MG/0.5ML ~~LOC~~ SOLN: 12.5000 mg | SUBCUTANEOUS | 0 refills | Status: AC

## 2024-03-06 NOTE — Telephone Encounter (Signed)
 Rx for tirzepatide  was sent to [facility] in error (rx cancelled with pharmacist). Resent to [employer].

## 2024-03-13 ENCOUNTER — Encounter: Admitting: Family

## 2024-03-19 ENCOUNTER — Encounter: Payer: Self-pay | Admitting: Family

## 2024-03-19 ENCOUNTER — Ambulatory Visit: Admitting: Family

## 2024-03-19 VITALS — BP 96/56 | HR 80 | Temp 98.3°F | Resp 16 | Ht 64.0 in | Wt 150.0 lb

## 2024-03-19 DIAGNOSIS — F419 Anxiety disorder, unspecified: Secondary | ICD-10-CM

## 2024-03-19 DIAGNOSIS — N3281 Overactive bladder: Secondary | ICD-10-CM

## 2024-03-19 DIAGNOSIS — R87610 Atypical squamous cells of undetermined significance on cytologic smear of cervix (ASC-US): Secondary | ICD-10-CM

## 2024-03-19 DIAGNOSIS — H353190 Nonexudative age-related macular degeneration, unspecified eye, stage unspecified: Secondary | ICD-10-CM

## 2024-03-19 DIAGNOSIS — E559 Vitamin D deficiency, unspecified: Secondary | ICD-10-CM

## 2024-03-19 DIAGNOSIS — Z8639 Personal history of other endocrine, nutritional and metabolic disease: Secondary | ICD-10-CM | POA: Insufficient documentation

## 2024-03-19 DIAGNOSIS — Z0000 Encounter for general adult medical examination without abnormal findings: Secondary | ICD-10-CM

## 2024-03-19 DIAGNOSIS — I10 Essential (primary) hypertension: Secondary | ICD-10-CM

## 2024-03-19 DIAGNOSIS — E538 Deficiency of other specified B group vitamins: Secondary | ICD-10-CM

## 2024-03-19 DIAGNOSIS — D649 Anemia, unspecified: Secondary | ICD-10-CM

## 2024-03-19 DIAGNOSIS — K219 Gastro-esophageal reflux disease without esophagitis: Secondary | ICD-10-CM

## 2024-03-19 DIAGNOSIS — E041 Nontoxic single thyroid nodule: Secondary | ICD-10-CM

## 2024-03-19 LAB — COMPREHENSIVE METABOLIC PANEL WITH GFR
ALT: 73 U/L — ABNORMAL HIGH (ref 3–35)
AST: 56 U/L — ABNORMAL HIGH (ref 5–37)
Albumin: 4.3 g/dL (ref 3.5–5.2)
Alkaline Phosphatase: 72 U/L (ref 39–117)
BUN: 26 mg/dL — ABNORMAL HIGH (ref 6–23)
CO2: 28 meq/L (ref 19–32)
Calcium: 9.8 mg/dL (ref 8.4–10.5)
Chloride: 103 meq/L (ref 96–112)
Creatinine, Ser: 0.57 mg/dL (ref 0.40–1.20)
GFR: 93.72 mL/min
Glucose, Bld: 84 mg/dL (ref 70–99)
Potassium: 4.2 meq/L (ref 3.5–5.1)
Sodium: 140 meq/L (ref 135–145)
Total Bilirubin: 0.6 mg/dL (ref 0.2–1.2)
Total Protein: 6.6 g/dL (ref 6.0–8.3)

## 2024-03-19 LAB — CBC WITH DIFFERENTIAL/PLATELET
Basophils Absolute: 0.1 10*3/uL (ref 0.0–0.1)
Basophils Relative: 1.3 % (ref 0.0–3.0)
Eosinophils Absolute: 0.1 10*3/uL (ref 0.0–0.7)
Eosinophils Relative: 1.6 % (ref 0.0–5.0)
HCT: 38.6 % (ref 36.0–46.0)
Hemoglobin: 12.8 g/dL (ref 12.0–15.0)
Lymphocytes Relative: 19.5 % (ref 12.0–46.0)
Lymphs Abs: 0.9 10*3/uL (ref 0.7–4.0)
MCHC: 33.1 g/dL (ref 30.0–36.0)
MCV: 95.3 fl (ref 78.0–100.0)
Monocytes Absolute: 0.4 10*3/uL (ref 0.1–1.0)
Monocytes Relative: 7.7 % (ref 3.0–12.0)
Neutro Abs: 3.3 10*3/uL (ref 1.4–7.7)
Neutrophils Relative %: 69.9 % (ref 43.0–77.0)
Platelets: 226 10*3/uL (ref 150.0–400.0)
RBC: 4.05 Mil/uL (ref 3.87–5.11)
RDW: 14.5 % (ref 11.5–15.5)
WBC: 4.8 10*3/uL (ref 4.0–10.5)

## 2024-03-19 LAB — VITAMIN D 25 HYDROXY (VIT D DEFICIENCY, FRACTURES): VITD: 57.87 ng/mL (ref 30.00–100.00)

## 2024-03-19 LAB — IBC + FERRITIN
Ferritin: 400.7 ng/mL — ABNORMAL HIGH (ref 10.0–291.0)
Iron: 64 ug/dL (ref 42–145)
Saturation Ratios: 32.2 % (ref 20.0–50.0)
TIBC: 198.8 ug/dL — ABNORMAL LOW (ref 250.0–450.0)
Transferrin: 142 mg/dL — ABNORMAL LOW (ref 212.0–360.0)

## 2024-03-19 LAB — VITAMIN B12: Vitamin B-12: 721 pg/mL (ref 211–911)

## 2024-03-19 LAB — TSH: TSH: 0.55 u[IU]/mL (ref 0.35–5.50)

## 2024-03-19 NOTE — Assessment & Plan Note (Signed)
Continues pantoprazole.

## 2024-03-19 NOTE — Assessment & Plan Note (Addendum)
 Status post significant weight loss, on pharmacotherapy Significant weight loss achieved with Zepbound . Weight is now acceptable for her age and we discussed focusing on weight maintenance at this point.  -She has rx for Zepbound  12.5mg  and will cut the dose into 3.125mg  doses weekly to see if this will help with maintenance.  - Discussed potential transition to [facility] tablets for more cost effective maintenance once she completes the zepbound  vial.  - Scheduled follow-up in 3 months for weight management.

## 2024-03-19 NOTE — Assessment & Plan Note (Signed)
 BP Readings from Last 3 Encounters:  03/19/24 (!) 96/56  10/25/23 122/78  08/28/23 (!) 151/82   BP stable without medications.

## 2024-03-19 NOTE — Assessment & Plan Note (Signed)
Following with ophthalmology. 

## 2024-03-19 NOTE — Assessment & Plan Note (Signed)
 Stable on buspar  and paxil .

## 2024-03-19 NOTE — Assessment & Plan Note (Signed)
Following with GYN

## 2024-03-19 NOTE — Assessment & Plan Note (Signed)
 Lab Results  Component Value Date   WBC 4.7 05/24/2023   HGB 12.4 05/24/2023   HCT 41.5 05/24/2023   MCV 96.7 05/24/2023   PLT 252 05/24/2023   Normal last check, update today.

## 2024-03-19 NOTE — Assessment & Plan Note (Signed)
Stable on myrbetriq
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
## 2024-03-22 ENCOUNTER — Other Ambulatory Visit: Payer: Self-pay

## 2024-03-22 ENCOUNTER — Telehealth: Payer: Self-pay | Admitting: Family

## 2024-03-22 DIAGNOSIS — M25562 Pain in left knee: Secondary | ICD-10-CM

## 2024-03-22 NOTE — Telephone Encounter (Signed)
 Referral's team made aware

## 2024-03-22 NOTE — Telephone Encounter (Signed)
 Copied from CRM #[id number]. 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 She will not be seen w/o a referral. She is in extreme pain and wants to be seen ASAP. She is at the office and req for referral to be sent to:   [facility] [address], [place], [place] [address]

## 2024-03-22 NOTE — Telephone Encounter (Signed)
 Referral entered, printed and faxed over to ortho

## 2024-03-28 ENCOUNTER — Ambulatory Visit: Admitting: Obstetrics and Gynecology

## 2024-04-12 ENCOUNTER — Encounter: Admitting: Family

## 2024-06-18 ENCOUNTER — Ambulatory Visit: Admitting: Family

## 2024-10-31 ENCOUNTER — Ambulatory Visit

## 2025-03-21 ENCOUNTER — Encounter: Admitting: Family
